# Patient Record
Sex: Female | Born: 1935 | Race: White | Hispanic: No | Smoking: Never smoker
Health system: Southern US, Community
[De-identification: ages and names within clinical notes are randomized; demographics above are authoritative.]

## PROBLEM LIST (undated history)

## (undated) DIAGNOSIS — F419 Anxiety disorder, unspecified: Secondary | ICD-10-CM

## (undated) DIAGNOSIS — I4891 Unspecified atrial fibrillation: Secondary | ICD-10-CM

## (undated) DIAGNOSIS — G4700 Insomnia, unspecified: Secondary | ICD-10-CM

## (undated) DIAGNOSIS — E785 Hyperlipidemia, unspecified: Secondary | ICD-10-CM

## (undated) DIAGNOSIS — F32A Depression, unspecified: Secondary | ICD-10-CM

## (undated) DIAGNOSIS — M1990 Unspecified osteoarthritis, unspecified site: Secondary | ICD-10-CM

## (undated) DIAGNOSIS — I10 Essential (primary) hypertension: Secondary | ICD-10-CM

## (undated) DIAGNOSIS — I5020 Unspecified systolic (congestive) heart failure: Secondary | ICD-10-CM

## (undated) DIAGNOSIS — F411 Generalized anxiety disorder: Secondary | ICD-10-CM

## (undated) DIAGNOSIS — Z5189 Encounter for other specified aftercare: Secondary | ICD-10-CM

## (undated) DIAGNOSIS — B0229 Other postherpetic nervous system involvement: Secondary | ICD-10-CM

## (undated) DIAGNOSIS — Z950 Presence of cardiac pacemaker: Secondary | ICD-10-CM

## (undated) DIAGNOSIS — H9190 Unspecified hearing loss, unspecified ear: Secondary | ICD-10-CM

## (undated) HISTORY — PX: PACEMAKER PLACEMENT: SHX43

## (undated) HISTORY — DX: Unspecified systolic (congestive) heart failure: I50.20

## (undated) HISTORY — DX: Unspecified hearing loss, unspecified ear: H91.90

## (undated) HISTORY — DX: Generalized anxiety disorder: F41.1

## (undated) HISTORY — DX: Other postherpetic nervous system involvement: B02.29

## (undated) HISTORY — DX: Hyperlipidemia, unspecified: E78.5

## (undated) HISTORY — DX: Anxiety disorder, unspecified: F41.9

## (undated) HISTORY — DX: Encounter for other specified aftercare: Z51.89

## (undated) HISTORY — DX: Unspecified atrial fibrillation: I48.91

## (undated) HISTORY — PX: TOTAL KNEE ARTHROPLASTY: SHX125

## (undated) HISTORY — PX: ABDOMINAL HYSTERECTOMY: SHX81

## (undated) HISTORY — DX: Essential (primary) hypertension: I10

---

## 2008-01-15 DIAGNOSIS — B0229 Other postherpetic nervous system involvement: Secondary | ICD-10-CM

## 2008-01-15 HISTORY — DX: Other postherpetic nervous system involvement: B02.29

## 2011-02-20 DIAGNOSIS — D510 Vitamin B12 deficiency anemia due to intrinsic factor deficiency: Secondary | ICD-10-CM | POA: Diagnosis not present

## 2011-02-20 DIAGNOSIS — E782 Mixed hyperlipidemia: Secondary | ICD-10-CM | POA: Diagnosis not present

## 2011-02-20 DIAGNOSIS — I10 Essential (primary) hypertension: Secondary | ICD-10-CM | POA: Diagnosis not present

## 2011-02-20 DIAGNOSIS — D509 Iron deficiency anemia, unspecified: Secondary | ICD-10-CM | POA: Diagnosis not present

## 2011-02-20 DIAGNOSIS — N390 Urinary tract infection, site not specified: Secondary | ICD-10-CM | POA: Diagnosis not present

## 2011-02-20 DIAGNOSIS — E038 Other specified hypothyroidism: Secondary | ICD-10-CM | POA: Diagnosis not present

## 2011-02-20 DIAGNOSIS — E559 Vitamin D deficiency, unspecified: Secondary | ICD-10-CM | POA: Diagnosis not present

## 2011-02-20 DIAGNOSIS — Z131 Encounter for screening for diabetes mellitus: Secondary | ICD-10-CM | POA: Diagnosis not present

## 2011-03-13 DIAGNOSIS — E782 Mixed hyperlipidemia: Secondary | ICD-10-CM | POA: Diagnosis not present

## 2011-03-13 DIAGNOSIS — E559 Vitamin D deficiency, unspecified: Secondary | ICD-10-CM | POA: Diagnosis not present

## 2011-03-13 DIAGNOSIS — N390 Urinary tract infection, site not specified: Secondary | ICD-10-CM | POA: Diagnosis not present

## 2011-03-13 DIAGNOSIS — M159 Polyosteoarthritis, unspecified: Secondary | ICD-10-CM | POA: Diagnosis not present

## 2011-03-13 DIAGNOSIS — B029 Zoster without complications: Secondary | ICD-10-CM | POA: Diagnosis not present

## 2011-05-31 DIAGNOSIS — I6529 Occlusion and stenosis of unspecified carotid artery: Secondary | ICD-10-CM | POA: Diagnosis not present

## 2011-05-31 DIAGNOSIS — N390 Urinary tract infection, site not specified: Secondary | ICD-10-CM | POA: Diagnosis not present

## 2011-06-19 DIAGNOSIS — I6529 Occlusion and stenosis of unspecified carotid artery: Secondary | ICD-10-CM | POA: Diagnosis not present

## 2011-06-19 DIAGNOSIS — B019 Varicella without complication: Secondary | ICD-10-CM | POA: Diagnosis not present

## 2011-06-19 DIAGNOSIS — I2109 ST elevation (STEMI) myocardial infarction involving other coronary artery of anterior wall: Secondary | ICD-10-CM | POA: Diagnosis not present

## 2011-06-19 DIAGNOSIS — E785 Hyperlipidemia, unspecified: Secondary | ICD-10-CM | POA: Diagnosis not present

## 2011-06-19 DIAGNOSIS — M1990 Unspecified osteoarthritis, unspecified site: Secondary | ICD-10-CM | POA: Diagnosis not present

## 2011-06-28 DIAGNOSIS — Z1231 Encounter for screening mammogram for malignant neoplasm of breast: Secondary | ICD-10-CM | POA: Diagnosis not present

## 2011-08-21 DIAGNOSIS — Z131 Encounter for screening for diabetes mellitus: Secondary | ICD-10-CM | POA: Diagnosis not present

## 2011-08-21 DIAGNOSIS — I509 Heart failure, unspecified: Secondary | ICD-10-CM | POA: Diagnosis not present

## 2011-08-21 DIAGNOSIS — I10 Essential (primary) hypertension: Secondary | ICD-10-CM | POA: Diagnosis not present

## 2011-08-21 DIAGNOSIS — E782 Mixed hyperlipidemia: Secondary | ICD-10-CM | POA: Diagnosis not present

## 2011-08-21 DIAGNOSIS — D509 Iron deficiency anemia, unspecified: Secondary | ICD-10-CM | POA: Diagnosis not present

## 2011-08-21 DIAGNOSIS — N390 Urinary tract infection, site not specified: Secondary | ICD-10-CM | POA: Diagnosis not present

## 2011-08-21 DIAGNOSIS — R7989 Other specified abnormal findings of blood chemistry: Secondary | ICD-10-CM | POA: Diagnosis not present

## 2011-09-04 DIAGNOSIS — I2510 Atherosclerotic heart disease of native coronary artery without angina pectoris: Secondary | ICD-10-CM | POA: Diagnosis not present

## 2011-09-04 DIAGNOSIS — M1710 Unilateral primary osteoarthritis, unspecified knee: Secondary | ICD-10-CM | POA: Diagnosis not present

## 2011-09-04 DIAGNOSIS — E785 Hyperlipidemia, unspecified: Secondary | ICD-10-CM | POA: Diagnosis not present

## 2011-09-04 DIAGNOSIS — N943 Premenstrual tension syndrome: Secondary | ICD-10-CM | POA: Diagnosis not present

## 2011-09-10 DIAGNOSIS — I739 Peripheral vascular disease, unspecified: Secondary | ICD-10-CM | POA: Diagnosis not present

## 2011-10-02 DIAGNOSIS — E785 Hyperlipidemia, unspecified: Secondary | ICD-10-CM | POA: Diagnosis not present

## 2011-10-02 DIAGNOSIS — I739 Peripheral vascular disease, unspecified: Secondary | ICD-10-CM | POA: Diagnosis not present

## 2011-10-02 DIAGNOSIS — M1990 Unspecified osteoarthritis, unspecified site: Secondary | ICD-10-CM | POA: Diagnosis not present

## 2011-10-02 DIAGNOSIS — R32 Unspecified urinary incontinence: Secondary | ICD-10-CM | POA: Diagnosis not present

## 2012-01-21 DIAGNOSIS — R350 Frequency of micturition: Secondary | ICD-10-CM | POA: Diagnosis not present

## 2012-01-21 DIAGNOSIS — N390 Urinary tract infection, site not specified: Secondary | ICD-10-CM | POA: Diagnosis not present

## 2012-01-21 DIAGNOSIS — B0229 Other postherpetic nervous system involvement: Secondary | ICD-10-CM | POA: Diagnosis not present

## 2012-01-21 DIAGNOSIS — M129 Arthropathy, unspecified: Secondary | ICD-10-CM | POA: Diagnosis not present

## 2012-01-21 DIAGNOSIS — E785 Hyperlipidemia, unspecified: Secondary | ICD-10-CM | POA: Diagnosis not present

## 2012-03-31 DIAGNOSIS — R300 Dysuria: Secondary | ICD-10-CM | POA: Diagnosis not present

## 2012-03-31 DIAGNOSIS — N390 Urinary tract infection, site not specified: Secondary | ICD-10-CM | POA: Diagnosis not present

## 2012-04-23 DIAGNOSIS — H6120 Impacted cerumen, unspecified ear: Secondary | ICD-10-CM | POA: Diagnosis not present

## 2012-05-07 DIAGNOSIS — H903 Sensorineural hearing loss, bilateral: Secondary | ICD-10-CM | POA: Diagnosis not present

## 2012-05-07 DIAGNOSIS — H6120 Impacted cerumen, unspecified ear: Secondary | ICD-10-CM | POA: Diagnosis not present

## 2012-07-01 DIAGNOSIS — Z1231 Encounter for screening mammogram for malignant neoplasm of breast: Secondary | ICD-10-CM | POA: Diagnosis not present

## 2012-07-01 DIAGNOSIS — Z0000 Encounter for general adult medical examination without abnormal findings: Secondary | ICD-10-CM | POA: Diagnosis not present

## 2012-07-01 DIAGNOSIS — Z23 Encounter for immunization: Secondary | ICD-10-CM | POA: Diagnosis not present

## 2012-07-03 DIAGNOSIS — M810 Age-related osteoporosis without current pathological fracture: Secondary | ICD-10-CM | POA: Diagnosis not present

## 2012-07-03 DIAGNOSIS — Z1231 Encounter for screening mammogram for malignant neoplasm of breast: Secondary | ICD-10-CM | POA: Diagnosis not present

## 2012-07-03 DIAGNOSIS — Z1389 Encounter for screening for other disorder: Secondary | ICD-10-CM | POA: Diagnosis not present

## 2012-07-03 DIAGNOSIS — Z780 Asymptomatic menopausal state: Secondary | ICD-10-CM | POA: Diagnosis not present

## 2012-07-07 DIAGNOSIS — Z1211 Encounter for screening for malignant neoplasm of colon: Secondary | ICD-10-CM | POA: Diagnosis not present

## 2012-10-19 DIAGNOSIS — Z23 Encounter for immunization: Secondary | ICD-10-CM | POA: Diagnosis not present

## 2012-10-20 DIAGNOSIS — N390 Urinary tract infection, site not specified: Secondary | ICD-10-CM | POA: Diagnosis not present

## 2012-10-22 DIAGNOSIS — E789 Disorder of lipoprotein metabolism, unspecified: Secondary | ICD-10-CM | POA: Diagnosis not present

## 2012-10-22 DIAGNOSIS — N390 Urinary tract infection, site not specified: Secondary | ICD-10-CM | POA: Diagnosis not present

## 2012-12-17 DIAGNOSIS — H65119 Acute and subacute allergic otitis media (mucoid) (sanguinous) (serous), unspecified ear: Secondary | ICD-10-CM | POA: Diagnosis not present

## 2012-12-17 DIAGNOSIS — R259 Unspecified abnormal involuntary movements: Secondary | ICD-10-CM | POA: Diagnosis not present

## 2012-12-17 DIAGNOSIS — H9319 Tinnitus, unspecified ear: Secondary | ICD-10-CM | POA: Diagnosis not present

## 2012-12-17 DIAGNOSIS — R5381 Other malaise: Secondary | ICD-10-CM | POA: Diagnosis not present

## 2012-12-17 DIAGNOSIS — G988 Other disorders of nervous system: Secondary | ICD-10-CM | POA: Diagnosis not present

## 2013-02-12 DIAGNOSIS — I5021 Acute systolic (congestive) heart failure: Secondary | ICD-10-CM | POA: Diagnosis not present

## 2013-02-12 DIAGNOSIS — I469 Cardiac arrest, cause unspecified: Secondary | ICD-10-CM | POA: Diagnosis not present

## 2013-02-12 DIAGNOSIS — J90 Pleural effusion, not elsewhere classified: Secondary | ICD-10-CM | POA: Diagnosis not present

## 2013-02-12 DIAGNOSIS — Q211 Atrial septal defect: Secondary | ICD-10-CM | POA: Diagnosis not present

## 2013-02-12 DIAGNOSIS — G7281 Critical illness myopathy: Secondary | ICD-10-CM | POA: Diagnosis not present

## 2013-02-12 DIAGNOSIS — Z9889 Other specified postprocedural states: Secondary | ICD-10-CM | POA: Diagnosis not present

## 2013-02-12 DIAGNOSIS — R570 Cardiogenic shock: Secondary | ICD-10-CM | POA: Diagnosis not present

## 2013-02-12 DIAGNOSIS — E785 Hyperlipidemia, unspecified: Secondary | ICD-10-CM | POA: Diagnosis not present

## 2013-02-12 DIAGNOSIS — I252 Old myocardial infarction: Secondary | ICD-10-CM | POA: Diagnosis not present

## 2013-02-12 DIAGNOSIS — J810 Acute pulmonary edema: Secondary | ICD-10-CM | POA: Diagnosis not present

## 2013-02-12 DIAGNOSIS — I10 Essential (primary) hypertension: Secondary | ICD-10-CM | POA: Diagnosis present

## 2013-02-12 DIAGNOSIS — Z950 Presence of cardiac pacemaker: Secondary | ICD-10-CM | POA: Diagnosis not present

## 2013-02-12 DIAGNOSIS — Z4682 Encounter for fitting and adjustment of non-vascular catheter: Secondary | ICD-10-CM | POA: Diagnosis not present

## 2013-02-12 DIAGNOSIS — N179 Acute kidney failure, unspecified: Secondary | ICD-10-CM | POA: Diagnosis not present

## 2013-02-12 DIAGNOSIS — R002 Palpitations: Secondary | ICD-10-CM | POA: Diagnosis not present

## 2013-02-12 DIAGNOSIS — B0223 Postherpetic polyneuropathy: Secondary | ICD-10-CM | POA: Diagnosis present

## 2013-02-12 DIAGNOSIS — Z96659 Presence of unspecified artificial knee joint: Secondary | ICD-10-CM | POA: Diagnosis not present

## 2013-02-12 DIAGNOSIS — D6869 Other thrombophilia: Secondary | ICD-10-CM | POA: Diagnosis not present

## 2013-02-12 DIAGNOSIS — Z87891 Personal history of nicotine dependence: Secondary | ICD-10-CM | POA: Diagnosis not present

## 2013-02-12 DIAGNOSIS — I4891 Unspecified atrial fibrillation: Secondary | ICD-10-CM | POA: Diagnosis not present

## 2013-02-12 DIAGNOSIS — R262 Difficulty in walking, not elsewhere classified: Secondary | ICD-10-CM | POA: Diagnosis not present

## 2013-02-12 DIAGNOSIS — I959 Hypotension, unspecified: Secondary | ICD-10-CM | POA: Diagnosis not present

## 2013-02-12 DIAGNOSIS — M1990 Unspecified osteoarthritis, unspecified site: Secondary | ICD-10-CM | POA: Diagnosis not present

## 2013-02-12 DIAGNOSIS — I499 Cardiac arrhythmia, unspecified: Secondary | ICD-10-CM | POA: Diagnosis not present

## 2013-02-12 DIAGNOSIS — S20219A Contusion of unspecified front wall of thorax, initial encounter: Secondary | ICD-10-CM | POA: Diagnosis not present

## 2013-02-12 DIAGNOSIS — Z90710 Acquired absence of both cervix and uterus: Secondary | ICD-10-CM | POA: Diagnosis not present

## 2013-02-12 DIAGNOSIS — K219 Gastro-esophageal reflux disease without esophagitis: Secondary | ICD-10-CM | POA: Diagnosis not present

## 2013-02-12 DIAGNOSIS — I509 Heart failure, unspecified: Secondary | ICD-10-CM | POA: Diagnosis not present

## 2013-02-12 DIAGNOSIS — M6281 Muscle weakness (generalized): Secondary | ICD-10-CM | POA: Diagnosis not present

## 2013-02-12 DIAGNOSIS — R7401 Elevation of levels of liver transaminase levels: Secondary | ICD-10-CM | POA: Diagnosis present

## 2013-02-12 DIAGNOSIS — R509 Fever, unspecified: Secondary | ICD-10-CM | POA: Diagnosis not present

## 2013-02-12 DIAGNOSIS — T17308A Unspecified foreign body in larynx causing other injury, initial encounter: Secondary | ICD-10-CM | POA: Diagnosis not present

## 2013-02-12 DIAGNOSIS — R9431 Abnormal electrocardiogram [ECG] [EKG]: Secondary | ICD-10-CM | POA: Diagnosis not present

## 2013-02-12 DIAGNOSIS — R1312 Dysphagia, oropharyngeal phase: Secondary | ICD-10-CM | POA: Diagnosis not present

## 2013-02-12 DIAGNOSIS — E119 Type 2 diabetes mellitus without complications: Secondary | ICD-10-CM | POA: Diagnosis not present

## 2013-02-12 DIAGNOSIS — R000 Tachycardia, unspecified: Secondary | ICD-10-CM | POA: Diagnosis not present

## 2013-02-12 DIAGNOSIS — T827XXA Infection and inflammatory reaction due to other cardiac and vascular devices, implants and grafts, initial encounter: Secondary | ICD-10-CM | POA: Diagnosis not present

## 2013-02-12 DIAGNOSIS — R488 Other symbolic dysfunctions: Secondary | ICD-10-CM | POA: Diagnosis not present

## 2013-02-12 DIAGNOSIS — Z7982 Long term (current) use of aspirin: Secondary | ICD-10-CM | POA: Diagnosis not present

## 2013-02-12 DIAGNOSIS — R7402 Elevation of levels of lactic acid dehydrogenase (LDH): Secondary | ICD-10-CM | POA: Diagnosis not present

## 2013-02-12 DIAGNOSIS — I4892 Unspecified atrial flutter: Secondary | ICD-10-CM | POA: Diagnosis not present

## 2013-02-12 DIAGNOSIS — E7800 Pure hypercholesterolemia, unspecified: Secondary | ICD-10-CM | POA: Diagnosis present

## 2013-02-12 DIAGNOSIS — R0609 Other forms of dyspnea: Secondary | ICD-10-CM | POA: Diagnosis not present

## 2013-02-12 DIAGNOSIS — I428 Other cardiomyopathies: Secondary | ICD-10-CM | POA: Diagnosis not present

## 2013-02-12 DIAGNOSIS — E789 Disorder of lipoprotein metabolism, unspecified: Secondary | ICD-10-CM | POA: Diagnosis not present

## 2013-02-12 DIAGNOSIS — R0602 Shortness of breath: Secondary | ICD-10-CM | POA: Diagnosis not present

## 2013-02-12 DIAGNOSIS — Z5189 Encounter for other specified aftercare: Secondary | ICD-10-CM | POA: Diagnosis not present

## 2013-02-12 DIAGNOSIS — T82897A Other specified complication of cardiac prosthetic devices, implants and grafts, initial encounter: Secondary | ICD-10-CM | POA: Diagnosis not present

## 2013-02-12 DIAGNOSIS — D72829 Elevated white blood cell count, unspecified: Secondary | ICD-10-CM | POA: Diagnosis not present

## 2013-02-12 DIAGNOSIS — Z8249 Family history of ischemic heart disease and other diseases of the circulatory system: Secondary | ICD-10-CM | POA: Diagnosis not present

## 2013-02-12 DIAGNOSIS — R7881 Bacteremia: Secondary | ICD-10-CM | POA: Diagnosis not present

## 2013-02-12 DIAGNOSIS — J811 Chronic pulmonary edema: Secondary | ICD-10-CM | POA: Diagnosis not present

## 2013-02-12 DIAGNOSIS — J189 Pneumonia, unspecified organism: Secondary | ICD-10-CM | POA: Diagnosis not present

## 2013-02-12 DIAGNOSIS — Q2111 Secundum atrial septal defect: Secondary | ICD-10-CM | POA: Diagnosis not present

## 2013-02-12 DIAGNOSIS — J9600 Acute respiratory failure, unspecified whether with hypoxia or hypercapnia: Secondary | ICD-10-CM | POA: Diagnosis not present

## 2013-02-12 DIAGNOSIS — I495 Sick sinus syndrome: Secondary | ICD-10-CM | POA: Diagnosis not present

## 2013-03-04 DIAGNOSIS — R488 Other symbolic dysfunctions: Secondary | ICD-10-CM | POA: Diagnosis not present

## 2013-03-04 DIAGNOSIS — I4891 Unspecified atrial fibrillation: Secondary | ICD-10-CM | POA: Diagnosis not present

## 2013-03-04 DIAGNOSIS — I5021 Acute systolic (congestive) heart failure: Secondary | ICD-10-CM | POA: Diagnosis not present

## 2013-03-04 DIAGNOSIS — I252 Old myocardial infarction: Secondary | ICD-10-CM | POA: Diagnosis not present

## 2013-03-04 DIAGNOSIS — E785 Hyperlipidemia, unspecified: Secondary | ICD-10-CM | POA: Diagnosis not present

## 2013-03-04 DIAGNOSIS — K219 Gastro-esophageal reflux disease without esophagitis: Secondary | ICD-10-CM | POA: Diagnosis not present

## 2013-03-04 DIAGNOSIS — R262 Difficulty in walking, not elsewhere classified: Secondary | ICD-10-CM | POA: Diagnosis not present

## 2013-03-04 DIAGNOSIS — I428 Other cardiomyopathies: Secondary | ICD-10-CM | POA: Diagnosis not present

## 2013-03-04 DIAGNOSIS — Z950 Presence of cardiac pacemaker: Secondary | ICD-10-CM | POA: Diagnosis not present

## 2013-03-04 DIAGNOSIS — Z5189 Encounter for other specified aftercare: Secondary | ICD-10-CM | POA: Diagnosis not present

## 2013-03-04 DIAGNOSIS — I4892 Unspecified atrial flutter: Secondary | ICD-10-CM | POA: Diagnosis not present

## 2013-03-04 DIAGNOSIS — R1312 Dysphagia, oropharyngeal phase: Secondary | ICD-10-CM | POA: Diagnosis not present

## 2013-03-04 DIAGNOSIS — M1990 Unspecified osteoarthritis, unspecified site: Secondary | ICD-10-CM | POA: Diagnosis not present

## 2013-03-04 DIAGNOSIS — M6281 Muscle weakness (generalized): Secondary | ICD-10-CM | POA: Diagnosis not present

## 2013-03-04 DIAGNOSIS — S20219A Contusion of unspecified front wall of thorax, initial encounter: Secondary | ICD-10-CM | POA: Diagnosis not present

## 2013-03-15 DIAGNOSIS — M159 Polyosteoarthritis, unspecified: Secondary | ICD-10-CM | POA: Diagnosis not present

## 2013-03-15 DIAGNOSIS — R1312 Dysphagia, oropharyngeal phase: Secondary | ICD-10-CM | POA: Diagnosis not present

## 2013-03-15 DIAGNOSIS — I4891 Unspecified atrial fibrillation: Secondary | ICD-10-CM | POA: Diagnosis not present

## 2013-03-15 DIAGNOSIS — M6281 Muscle weakness (generalized): Secondary | ICD-10-CM | POA: Diagnosis not present

## 2013-03-15 DIAGNOSIS — I517 Cardiomegaly: Secondary | ICD-10-CM | POA: Diagnosis not present

## 2013-03-15 DIAGNOSIS — R488 Other symbolic dysfunctions: Secondary | ICD-10-CM | POA: Diagnosis not present

## 2013-03-16 DIAGNOSIS — B0229 Other postherpetic nervous system involvement: Secondary | ICD-10-CM | POA: Diagnosis not present

## 2013-03-16 DIAGNOSIS — Z5189 Encounter for other specified aftercare: Secondary | ICD-10-CM | POA: Diagnosis not present

## 2013-03-16 DIAGNOSIS — I509 Heart failure, unspecified: Secondary | ICD-10-CM | POA: Diagnosis present

## 2013-03-16 DIAGNOSIS — I499 Cardiac arrhythmia, unspecified: Secondary | ICD-10-CM | POA: Diagnosis not present

## 2013-03-16 DIAGNOSIS — R55 Syncope and collapse: Secondary | ICD-10-CM | POA: Diagnosis not present

## 2013-03-16 DIAGNOSIS — M6281 Muscle weakness (generalized): Secondary | ICD-10-CM | POA: Diagnosis not present

## 2013-03-16 DIAGNOSIS — E860 Dehydration: Secondary | ICD-10-CM | POA: Diagnosis not present

## 2013-03-16 DIAGNOSIS — R5383 Other fatigue: Secondary | ICD-10-CM | POA: Diagnosis not present

## 2013-03-16 DIAGNOSIS — E785 Hyperlipidemia, unspecified: Secondary | ICD-10-CM | POA: Diagnosis not present

## 2013-03-16 DIAGNOSIS — K219 Gastro-esophageal reflux disease without esophagitis: Secondary | ICD-10-CM | POA: Diagnosis not present

## 2013-03-16 DIAGNOSIS — N19 Unspecified kidney failure: Secondary | ICD-10-CM | POA: Diagnosis not present

## 2013-03-16 DIAGNOSIS — R488 Other symbolic dysfunctions: Secondary | ICD-10-CM | POA: Diagnosis not present

## 2013-03-16 DIAGNOSIS — I4891 Unspecified atrial fibrillation: Secondary | ICD-10-CM | POA: Diagnosis not present

## 2013-03-16 DIAGNOSIS — R1312 Dysphagia, oropharyngeal phase: Secondary | ICD-10-CM | POA: Diagnosis not present

## 2013-03-16 DIAGNOSIS — I4902 Ventricular flutter: Secondary | ICD-10-CM | POA: Diagnosis not present

## 2013-03-16 DIAGNOSIS — M1990 Unspecified osteoarthritis, unspecified site: Secondary | ICD-10-CM | POA: Diagnosis not present

## 2013-03-16 DIAGNOSIS — R269 Unspecified abnormalities of gait and mobility: Secondary | ICD-10-CM | POA: Diagnosis not present

## 2013-03-16 DIAGNOSIS — I428 Other cardiomyopathies: Secondary | ICD-10-CM | POA: Diagnosis not present

## 2013-03-16 DIAGNOSIS — R5381 Other malaise: Secondary | ICD-10-CM | POA: Diagnosis not present

## 2013-03-16 DIAGNOSIS — R29898 Other symptoms and signs involving the musculoskeletal system: Secondary | ICD-10-CM | POA: Diagnosis present

## 2013-03-16 DIAGNOSIS — N179 Acute kidney failure, unspecified: Secondary | ICD-10-CM | POA: Diagnosis not present

## 2013-03-16 DIAGNOSIS — I4892 Unspecified atrial flutter: Secondary | ICD-10-CM | POA: Diagnosis not present

## 2013-03-16 DIAGNOSIS — Z9181 History of falling: Secondary | ICD-10-CM | POA: Diagnosis not present

## 2013-03-18 DIAGNOSIS — K219 Gastro-esophageal reflux disease without esophagitis: Secondary | ICD-10-CM | POA: Diagnosis not present

## 2013-03-18 DIAGNOSIS — R1312 Dysphagia, oropharyngeal phase: Secondary | ICD-10-CM | POA: Diagnosis not present

## 2013-03-18 DIAGNOSIS — I4902 Ventricular flutter: Secondary | ICD-10-CM | POA: Diagnosis not present

## 2013-03-18 DIAGNOSIS — I509 Heart failure, unspecified: Secondary | ICD-10-CM | POA: Diagnosis not present

## 2013-03-18 DIAGNOSIS — N19 Unspecified kidney failure: Secondary | ICD-10-CM | POA: Diagnosis not present

## 2013-03-18 DIAGNOSIS — B0223 Postherpetic polyneuropathy: Secondary | ICD-10-CM | POA: Diagnosis not present

## 2013-03-18 DIAGNOSIS — I495 Sick sinus syndrome: Secondary | ICD-10-CM | POA: Diagnosis not present

## 2013-03-18 DIAGNOSIS — Z5189 Encounter for other specified aftercare: Secondary | ICD-10-CM | POA: Diagnosis not present

## 2013-03-18 DIAGNOSIS — R488 Other symbolic dysfunctions: Secondary | ICD-10-CM | POA: Diagnosis not present

## 2013-03-18 DIAGNOSIS — R269 Unspecified abnormalities of gait and mobility: Secondary | ICD-10-CM | POA: Diagnosis not present

## 2013-03-18 DIAGNOSIS — I4891 Unspecified atrial fibrillation: Secondary | ICD-10-CM | POA: Diagnosis not present

## 2013-03-18 DIAGNOSIS — I5020 Unspecified systolic (congestive) heart failure: Secondary | ICD-10-CM | POA: Diagnosis not present

## 2013-03-18 DIAGNOSIS — M6281 Muscle weakness (generalized): Secondary | ICD-10-CM | POA: Diagnosis not present

## 2013-03-18 DIAGNOSIS — I428 Other cardiomyopathies: Secondary | ICD-10-CM | POA: Diagnosis not present

## 2013-03-18 DIAGNOSIS — R262 Difficulty in walking, not elsewhere classified: Secondary | ICD-10-CM | POA: Diagnosis not present

## 2013-03-18 DIAGNOSIS — M1990 Unspecified osteoarthritis, unspecified site: Secondary | ICD-10-CM | POA: Diagnosis not present

## 2013-03-18 DIAGNOSIS — R42 Dizziness and giddiness: Secondary | ICD-10-CM | POA: Diagnosis not present

## 2013-03-18 DIAGNOSIS — E785 Hyperlipidemia, unspecified: Secondary | ICD-10-CM | POA: Diagnosis not present

## 2013-03-18 DIAGNOSIS — I4892 Unspecified atrial flutter: Secondary | ICD-10-CM | POA: Diagnosis not present

## 2013-03-19 DIAGNOSIS — B0223 Postherpetic polyneuropathy: Secondary | ICD-10-CM | POA: Diagnosis not present

## 2013-03-19 DIAGNOSIS — R262 Difficulty in walking, not elsewhere classified: Secondary | ICD-10-CM | POA: Diagnosis not present

## 2013-03-19 DIAGNOSIS — I4891 Unspecified atrial fibrillation: Secondary | ICD-10-CM | POA: Diagnosis not present

## 2013-03-19 DIAGNOSIS — M6281 Muscle weakness (generalized): Secondary | ICD-10-CM | POA: Diagnosis not present

## 2013-03-23 DIAGNOSIS — R42 Dizziness and giddiness: Secondary | ICD-10-CM | POA: Diagnosis not present

## 2013-03-23 DIAGNOSIS — I495 Sick sinus syndrome: Secondary | ICD-10-CM | POA: Diagnosis not present

## 2013-03-23 DIAGNOSIS — I4892 Unspecified atrial flutter: Secondary | ICD-10-CM | POA: Diagnosis not present

## 2013-03-25 DIAGNOSIS — I4892 Unspecified atrial flutter: Secondary | ICD-10-CM | POA: Diagnosis not present

## 2013-03-25 DIAGNOSIS — I5020 Unspecified systolic (congestive) heart failure: Secondary | ICD-10-CM | POA: Diagnosis not present

## 2013-03-25 DIAGNOSIS — E785 Hyperlipidemia, unspecified: Secondary | ICD-10-CM | POA: Diagnosis not present

## 2013-03-25 DIAGNOSIS — R269 Unspecified abnormalities of gait and mobility: Secondary | ICD-10-CM | POA: Diagnosis not present

## 2013-03-28 DIAGNOSIS — M159 Polyosteoarthritis, unspecified: Secondary | ICD-10-CM | POA: Diagnosis not present

## 2013-03-28 DIAGNOSIS — R488 Other symbolic dysfunctions: Secondary | ICD-10-CM | POA: Diagnosis not present

## 2013-03-28 DIAGNOSIS — R1312 Dysphagia, oropharyngeal phase: Secondary | ICD-10-CM | POA: Diagnosis not present

## 2013-03-28 DIAGNOSIS — M6281 Muscle weakness (generalized): Secondary | ICD-10-CM | POA: Diagnosis not present

## 2013-03-28 DIAGNOSIS — I517 Cardiomegaly: Secondary | ICD-10-CM | POA: Diagnosis not present

## 2013-03-28 DIAGNOSIS — I4891 Unspecified atrial fibrillation: Secondary | ICD-10-CM | POA: Diagnosis not present

## 2013-03-30 DIAGNOSIS — M159 Polyosteoarthritis, unspecified: Secondary | ICD-10-CM | POA: Diagnosis not present

## 2013-03-30 DIAGNOSIS — I4891 Unspecified atrial fibrillation: Secondary | ICD-10-CM | POA: Diagnosis not present

## 2013-03-30 DIAGNOSIS — R488 Other symbolic dysfunctions: Secondary | ICD-10-CM | POA: Diagnosis not present

## 2013-03-30 DIAGNOSIS — M6281 Muscle weakness (generalized): Secondary | ICD-10-CM | POA: Diagnosis not present

## 2013-03-30 DIAGNOSIS — I517 Cardiomegaly: Secondary | ICD-10-CM | POA: Diagnosis not present

## 2013-03-30 DIAGNOSIS — R1312 Dysphagia, oropharyngeal phase: Secondary | ICD-10-CM | POA: Diagnosis not present

## 2013-03-31 DIAGNOSIS — I4891 Unspecified atrial fibrillation: Secondary | ICD-10-CM | POA: Diagnosis not present

## 2013-03-31 DIAGNOSIS — R1312 Dysphagia, oropharyngeal phase: Secondary | ICD-10-CM | POA: Diagnosis not present

## 2013-03-31 DIAGNOSIS — M6281 Muscle weakness (generalized): Secondary | ICD-10-CM | POA: Diagnosis not present

## 2013-03-31 DIAGNOSIS — M159 Polyosteoarthritis, unspecified: Secondary | ICD-10-CM | POA: Diagnosis not present

## 2013-03-31 DIAGNOSIS — R488 Other symbolic dysfunctions: Secondary | ICD-10-CM | POA: Diagnosis not present

## 2013-03-31 DIAGNOSIS — I517 Cardiomegaly: Secondary | ICD-10-CM | POA: Diagnosis not present

## 2013-04-03 DIAGNOSIS — R112 Nausea with vomiting, unspecified: Secondary | ICD-10-CM | POA: Diagnosis not present

## 2013-04-03 DIAGNOSIS — R109 Unspecified abdominal pain: Secondary | ICD-10-CM | POA: Diagnosis not present

## 2013-04-03 DIAGNOSIS — R42 Dizziness and giddiness: Secondary | ICD-10-CM | POA: Diagnosis not present

## 2013-04-03 DIAGNOSIS — I498 Other specified cardiac arrhythmias: Secondary | ICD-10-CM | POA: Diagnosis not present

## 2013-04-03 DIAGNOSIS — T82190A Other mechanical complication of cardiac electrode, initial encounter: Secondary | ICD-10-CM | POA: Diagnosis not present

## 2013-04-03 DIAGNOSIS — E7800 Pure hypercholesterolemia, unspecified: Secondary | ICD-10-CM | POA: Diagnosis not present

## 2013-04-03 DIAGNOSIS — I4891 Unspecified atrial fibrillation: Secondary | ICD-10-CM | POA: Diagnosis not present

## 2013-04-04 DIAGNOSIS — R109 Unspecified abdominal pain: Secondary | ICD-10-CM | POA: Diagnosis not present

## 2013-04-04 DIAGNOSIS — R42 Dizziness and giddiness: Secondary | ICD-10-CM | POA: Diagnosis not present

## 2013-04-04 DIAGNOSIS — B0223 Postherpetic polyneuropathy: Secondary | ICD-10-CM | POA: Diagnosis not present

## 2013-04-04 DIAGNOSIS — I509 Heart failure, unspecified: Secondary | ICD-10-CM | POA: Diagnosis not present

## 2013-04-04 DIAGNOSIS — R5383 Other fatigue: Secondary | ICD-10-CM | POA: Diagnosis present

## 2013-04-04 DIAGNOSIS — I4892 Unspecified atrial flutter: Secondary | ICD-10-CM | POA: Diagnosis not present

## 2013-04-04 DIAGNOSIS — I10 Essential (primary) hypertension: Secondary | ICD-10-CM | POA: Diagnosis not present

## 2013-04-04 DIAGNOSIS — R1110 Vomiting, unspecified: Secondary | ICD-10-CM | POA: Diagnosis not present

## 2013-04-04 DIAGNOSIS — R55 Syncope and collapse: Secondary | ICD-10-CM | POA: Diagnosis not present

## 2013-04-04 DIAGNOSIS — Z7982 Long term (current) use of aspirin: Secondary | ICD-10-CM | POA: Diagnosis not present

## 2013-04-04 DIAGNOSIS — IMO0002 Reserved for concepts with insufficient information to code with codable children: Secondary | ICD-10-CM | POA: Diagnosis present

## 2013-04-04 DIAGNOSIS — E7800 Pure hypercholesterolemia, unspecified: Secondary | ICD-10-CM | POA: Diagnosis not present

## 2013-04-04 DIAGNOSIS — Z95810 Presence of automatic (implantable) cardiac defibrillator: Secondary | ICD-10-CM | POA: Diagnosis not present

## 2013-04-04 DIAGNOSIS — R5381 Other malaise: Secondary | ICD-10-CM | POA: Diagnosis present

## 2013-04-04 DIAGNOSIS — Z87891 Personal history of nicotine dependence: Secondary | ICD-10-CM | POA: Diagnosis not present

## 2013-04-04 DIAGNOSIS — Z950 Presence of cardiac pacemaker: Secondary | ICD-10-CM | POA: Diagnosis not present

## 2013-04-04 DIAGNOSIS — I498 Other specified cardiac arrhythmias: Secondary | ICD-10-CM | POA: Diagnosis not present

## 2013-04-04 DIAGNOSIS — I4891 Unspecified atrial fibrillation: Secondary | ICD-10-CM | POA: Diagnosis not present

## 2013-04-04 DIAGNOSIS — Z96659 Presence of unspecified artificial knee joint: Secondary | ICD-10-CM | POA: Diagnosis not present

## 2013-04-04 DIAGNOSIS — I442 Atrioventricular block, complete: Secondary | ICD-10-CM | POA: Diagnosis not present

## 2013-04-04 DIAGNOSIS — R112 Nausea with vomiting, unspecified: Secondary | ICD-10-CM | POA: Diagnosis present

## 2013-04-07 DIAGNOSIS — I4891 Unspecified atrial fibrillation: Secondary | ICD-10-CM | POA: Diagnosis not present

## 2013-04-07 DIAGNOSIS — M159 Polyosteoarthritis, unspecified: Secondary | ICD-10-CM | POA: Diagnosis not present

## 2013-04-07 DIAGNOSIS — M6281 Muscle weakness (generalized): Secondary | ICD-10-CM | POA: Diagnosis not present

## 2013-04-07 DIAGNOSIS — I517 Cardiomegaly: Secondary | ICD-10-CM | POA: Diagnosis not present

## 2013-04-07 DIAGNOSIS — R1312 Dysphagia, oropharyngeal phase: Secondary | ICD-10-CM | POA: Diagnosis not present

## 2013-04-07 DIAGNOSIS — R488 Other symbolic dysfunctions: Secondary | ICD-10-CM | POA: Diagnosis not present

## 2013-04-09 DIAGNOSIS — M6281 Muscle weakness (generalized): Secondary | ICD-10-CM | POA: Diagnosis not present

## 2013-04-09 DIAGNOSIS — M159 Polyosteoarthritis, unspecified: Secondary | ICD-10-CM | POA: Diagnosis not present

## 2013-04-09 DIAGNOSIS — I4891 Unspecified atrial fibrillation: Secondary | ICD-10-CM | POA: Diagnosis not present

## 2013-04-09 DIAGNOSIS — I517 Cardiomegaly: Secondary | ICD-10-CM | POA: Diagnosis not present

## 2013-04-09 DIAGNOSIS — R488 Other symbolic dysfunctions: Secondary | ICD-10-CM | POA: Diagnosis not present

## 2013-04-09 DIAGNOSIS — R1312 Dysphagia, oropharyngeal phase: Secondary | ICD-10-CM | POA: Diagnosis not present

## 2013-04-12 DIAGNOSIS — I517 Cardiomegaly: Secondary | ICD-10-CM | POA: Diagnosis not present

## 2013-04-12 DIAGNOSIS — M159 Polyosteoarthritis, unspecified: Secondary | ICD-10-CM | POA: Diagnosis not present

## 2013-04-12 DIAGNOSIS — R488 Other symbolic dysfunctions: Secondary | ICD-10-CM | POA: Diagnosis not present

## 2013-04-12 DIAGNOSIS — I4891 Unspecified atrial fibrillation: Secondary | ICD-10-CM | POA: Diagnosis not present

## 2013-04-12 DIAGNOSIS — M6281 Muscle weakness (generalized): Secondary | ICD-10-CM | POA: Diagnosis not present

## 2013-04-12 DIAGNOSIS — R1312 Dysphagia, oropharyngeal phase: Secondary | ICD-10-CM | POA: Diagnosis not present

## 2013-04-13 DIAGNOSIS — M6281 Muscle weakness (generalized): Secondary | ICD-10-CM | POA: Diagnosis not present

## 2013-04-13 DIAGNOSIS — R1312 Dysphagia, oropharyngeal phase: Secondary | ICD-10-CM | POA: Diagnosis not present

## 2013-04-13 DIAGNOSIS — R488 Other symbolic dysfunctions: Secondary | ICD-10-CM | POA: Diagnosis not present

## 2013-04-13 DIAGNOSIS — M159 Polyosteoarthritis, unspecified: Secondary | ICD-10-CM | POA: Diagnosis not present

## 2013-04-13 DIAGNOSIS — I517 Cardiomegaly: Secondary | ICD-10-CM | POA: Diagnosis not present

## 2013-04-13 DIAGNOSIS — I4891 Unspecified atrial fibrillation: Secondary | ICD-10-CM | POA: Diagnosis not present

## 2013-04-14 DIAGNOSIS — R488 Other symbolic dysfunctions: Secondary | ICD-10-CM | POA: Diagnosis not present

## 2013-04-14 DIAGNOSIS — I517 Cardiomegaly: Secondary | ICD-10-CM | POA: Diagnosis not present

## 2013-04-14 DIAGNOSIS — M6281 Muscle weakness (generalized): Secondary | ICD-10-CM | POA: Diagnosis not present

## 2013-04-14 DIAGNOSIS — I4891 Unspecified atrial fibrillation: Secondary | ICD-10-CM | POA: Diagnosis not present

## 2013-04-14 DIAGNOSIS — R1312 Dysphagia, oropharyngeal phase: Secondary | ICD-10-CM | POA: Diagnosis not present

## 2013-04-14 DIAGNOSIS — M159 Polyosteoarthritis, unspecified: Secondary | ICD-10-CM | POA: Diagnosis not present

## 2013-04-19 DIAGNOSIS — I517 Cardiomegaly: Secondary | ICD-10-CM | POA: Diagnosis not present

## 2013-04-19 DIAGNOSIS — R488 Other symbolic dysfunctions: Secondary | ICD-10-CM | POA: Diagnosis not present

## 2013-04-19 DIAGNOSIS — R1312 Dysphagia, oropharyngeal phase: Secondary | ICD-10-CM | POA: Diagnosis not present

## 2013-04-19 DIAGNOSIS — I4891 Unspecified atrial fibrillation: Secondary | ICD-10-CM | POA: Diagnosis not present

## 2013-04-19 DIAGNOSIS — M159 Polyosteoarthritis, unspecified: Secondary | ICD-10-CM | POA: Diagnosis not present

## 2013-04-19 DIAGNOSIS — M6281 Muscle weakness (generalized): Secondary | ICD-10-CM | POA: Diagnosis not present

## 2013-04-20 DIAGNOSIS — I4891 Unspecified atrial fibrillation: Secondary | ICD-10-CM | POA: Diagnosis not present

## 2013-04-20 DIAGNOSIS — R42 Dizziness and giddiness: Secondary | ICD-10-CM | POA: Diagnosis not present

## 2013-04-20 DIAGNOSIS — M159 Polyosteoarthritis, unspecified: Secondary | ICD-10-CM | POA: Diagnosis not present

## 2013-04-20 DIAGNOSIS — I4892 Unspecified atrial flutter: Secondary | ICD-10-CM | POA: Diagnosis not present

## 2013-04-20 DIAGNOSIS — I517 Cardiomegaly: Secondary | ICD-10-CM | POA: Diagnosis not present

## 2013-04-20 DIAGNOSIS — R1312 Dysphagia, oropharyngeal phase: Secondary | ICD-10-CM | POA: Diagnosis not present

## 2013-04-20 DIAGNOSIS — I495 Sick sinus syndrome: Secondary | ICD-10-CM | POA: Diagnosis not present

## 2013-04-20 DIAGNOSIS — R488 Other symbolic dysfunctions: Secondary | ICD-10-CM | POA: Diagnosis not present

## 2013-04-20 DIAGNOSIS — I499 Cardiac arrhythmia, unspecified: Secondary | ICD-10-CM | POA: Diagnosis not present

## 2013-04-20 DIAGNOSIS — R000 Tachycardia, unspecified: Secondary | ICD-10-CM | POA: Diagnosis not present

## 2013-04-20 DIAGNOSIS — M6281 Muscle weakness (generalized): Secondary | ICD-10-CM | POA: Diagnosis not present

## 2013-04-21 DIAGNOSIS — I4892 Unspecified atrial flutter: Secondary | ICD-10-CM | POA: Diagnosis not present

## 2013-04-21 DIAGNOSIS — R42 Dizziness and giddiness: Secondary | ICD-10-CM | POA: Diagnosis not present

## 2013-04-21 DIAGNOSIS — I499 Cardiac arrhythmia, unspecified: Secondary | ICD-10-CM | POA: Diagnosis not present

## 2013-04-21 DIAGNOSIS — I495 Sick sinus syndrome: Secondary | ICD-10-CM | POA: Diagnosis not present

## 2013-04-21 DIAGNOSIS — I4891 Unspecified atrial fibrillation: Secondary | ICD-10-CM | POA: Diagnosis not present

## 2013-04-21 DIAGNOSIS — R000 Tachycardia, unspecified: Secondary | ICD-10-CM | POA: Diagnosis not present

## 2013-04-22 DIAGNOSIS — I517 Cardiomegaly: Secondary | ICD-10-CM | POA: Diagnosis not present

## 2013-04-22 DIAGNOSIS — M6281 Muscle weakness (generalized): Secondary | ICD-10-CM | POA: Diagnosis not present

## 2013-04-22 DIAGNOSIS — N390 Urinary tract infection, site not specified: Secondary | ICD-10-CM | POA: Diagnosis not present

## 2013-04-22 DIAGNOSIS — M159 Polyosteoarthritis, unspecified: Secondary | ICD-10-CM | POA: Diagnosis not present

## 2013-04-22 DIAGNOSIS — I509 Heart failure, unspecified: Secondary | ICD-10-CM | POA: Diagnosis not present

## 2013-04-22 DIAGNOSIS — R1312 Dysphagia, oropharyngeal phase: Secondary | ICD-10-CM | POA: Diagnosis not present

## 2013-04-22 DIAGNOSIS — I4891 Unspecified atrial fibrillation: Secondary | ICD-10-CM | POA: Diagnosis not present

## 2013-04-22 DIAGNOSIS — R488 Other symbolic dysfunctions: Secondary | ICD-10-CM | POA: Diagnosis not present

## 2013-04-26 DIAGNOSIS — M159 Polyosteoarthritis, unspecified: Secondary | ICD-10-CM | POA: Diagnosis not present

## 2013-04-26 DIAGNOSIS — M6281 Muscle weakness (generalized): Secondary | ICD-10-CM | POA: Diagnosis not present

## 2013-04-26 DIAGNOSIS — I517 Cardiomegaly: Secondary | ICD-10-CM | POA: Diagnosis not present

## 2013-04-26 DIAGNOSIS — R1312 Dysphagia, oropharyngeal phase: Secondary | ICD-10-CM | POA: Diagnosis not present

## 2013-04-26 DIAGNOSIS — R488 Other symbolic dysfunctions: Secondary | ICD-10-CM | POA: Diagnosis not present

## 2013-04-26 DIAGNOSIS — I4891 Unspecified atrial fibrillation: Secondary | ICD-10-CM | POA: Diagnosis not present

## 2013-04-27 DIAGNOSIS — I517 Cardiomegaly: Secondary | ICD-10-CM | POA: Diagnosis not present

## 2013-04-27 DIAGNOSIS — M159 Polyosteoarthritis, unspecified: Secondary | ICD-10-CM | POA: Diagnosis not present

## 2013-04-27 DIAGNOSIS — I4891 Unspecified atrial fibrillation: Secondary | ICD-10-CM | POA: Diagnosis not present

## 2013-04-27 DIAGNOSIS — M6281 Muscle weakness (generalized): Secondary | ICD-10-CM | POA: Diagnosis not present

## 2013-04-27 DIAGNOSIS — R488 Other symbolic dysfunctions: Secondary | ICD-10-CM | POA: Diagnosis not present

## 2013-04-27 DIAGNOSIS — R1312 Dysphagia, oropharyngeal phase: Secondary | ICD-10-CM | POA: Diagnosis not present

## 2013-04-28 DIAGNOSIS — E785 Hyperlipidemia, unspecified: Secondary | ICD-10-CM | POA: Diagnosis not present

## 2013-04-28 DIAGNOSIS — I4892 Unspecified atrial flutter: Secondary | ICD-10-CM | POA: Diagnosis not present

## 2013-04-28 DIAGNOSIS — R109 Unspecified abdominal pain: Secondary | ICD-10-CM | POA: Diagnosis not present

## 2013-04-28 DIAGNOSIS — R5381 Other malaise: Secondary | ICD-10-CM | POA: Diagnosis not present

## 2013-04-30 DIAGNOSIS — R488 Other symbolic dysfunctions: Secondary | ICD-10-CM | POA: Diagnosis not present

## 2013-04-30 DIAGNOSIS — M6281 Muscle weakness (generalized): Secondary | ICD-10-CM | POA: Diagnosis not present

## 2013-04-30 DIAGNOSIS — I4891 Unspecified atrial fibrillation: Secondary | ICD-10-CM | POA: Diagnosis not present

## 2013-04-30 DIAGNOSIS — I509 Heart failure, unspecified: Secondary | ICD-10-CM | POA: Diagnosis not present

## 2013-04-30 DIAGNOSIS — R1312 Dysphagia, oropharyngeal phase: Secondary | ICD-10-CM | POA: Diagnosis not present

## 2013-04-30 DIAGNOSIS — I517 Cardiomegaly: Secondary | ICD-10-CM | POA: Diagnosis not present

## 2013-04-30 DIAGNOSIS — M159 Polyosteoarthritis, unspecified: Secondary | ICD-10-CM | POA: Diagnosis not present

## 2013-05-04 DIAGNOSIS — I517 Cardiomegaly: Secondary | ICD-10-CM | POA: Diagnosis not present

## 2013-05-04 DIAGNOSIS — M6281 Muscle weakness (generalized): Secondary | ICD-10-CM | POA: Diagnosis not present

## 2013-05-04 DIAGNOSIS — I4891 Unspecified atrial fibrillation: Secondary | ICD-10-CM | POA: Diagnosis not present

## 2013-05-04 DIAGNOSIS — R1312 Dysphagia, oropharyngeal phase: Secondary | ICD-10-CM | POA: Diagnosis not present

## 2013-05-04 DIAGNOSIS — R488 Other symbolic dysfunctions: Secondary | ICD-10-CM | POA: Diagnosis not present

## 2013-05-04 DIAGNOSIS — M159 Polyosteoarthritis, unspecified: Secondary | ICD-10-CM | POA: Diagnosis not present

## 2013-05-06 DIAGNOSIS — M6281 Muscle weakness (generalized): Secondary | ICD-10-CM | POA: Diagnosis not present

## 2013-05-06 DIAGNOSIS — I4891 Unspecified atrial fibrillation: Secondary | ICD-10-CM | POA: Diagnosis not present

## 2013-05-06 DIAGNOSIS — R1312 Dysphagia, oropharyngeal phase: Secondary | ICD-10-CM | POA: Diagnosis not present

## 2013-05-06 DIAGNOSIS — I517 Cardiomegaly: Secondary | ICD-10-CM | POA: Diagnosis not present

## 2013-05-06 DIAGNOSIS — R488 Other symbolic dysfunctions: Secondary | ICD-10-CM | POA: Diagnosis not present

## 2013-05-06 DIAGNOSIS — M159 Polyosteoarthritis, unspecified: Secondary | ICD-10-CM | POA: Diagnosis not present

## 2013-05-10 DIAGNOSIS — R1312 Dysphagia, oropharyngeal phase: Secondary | ICD-10-CM | POA: Diagnosis not present

## 2013-05-10 DIAGNOSIS — M159 Polyosteoarthritis, unspecified: Secondary | ICD-10-CM | POA: Diagnosis not present

## 2013-05-10 DIAGNOSIS — R488 Other symbolic dysfunctions: Secondary | ICD-10-CM | POA: Diagnosis not present

## 2013-05-10 DIAGNOSIS — M6281 Muscle weakness (generalized): Secondary | ICD-10-CM | POA: Diagnosis not present

## 2013-05-10 DIAGNOSIS — I517 Cardiomegaly: Secondary | ICD-10-CM | POA: Diagnosis not present

## 2013-05-10 DIAGNOSIS — I4891 Unspecified atrial fibrillation: Secondary | ICD-10-CM | POA: Diagnosis not present

## 2013-05-11 DIAGNOSIS — I4891 Unspecified atrial fibrillation: Secondary | ICD-10-CM | POA: Diagnosis not present

## 2013-05-11 DIAGNOSIS — M159 Polyosteoarthritis, unspecified: Secondary | ICD-10-CM | POA: Diagnosis not present

## 2013-05-11 DIAGNOSIS — R488 Other symbolic dysfunctions: Secondary | ICD-10-CM | POA: Diagnosis not present

## 2013-05-11 DIAGNOSIS — M6281 Muscle weakness (generalized): Secondary | ICD-10-CM | POA: Diagnosis not present

## 2013-05-11 DIAGNOSIS — I517 Cardiomegaly: Secondary | ICD-10-CM | POA: Diagnosis not present

## 2013-05-11 DIAGNOSIS — R1312 Dysphagia, oropharyngeal phase: Secondary | ICD-10-CM | POA: Diagnosis not present

## 2013-05-13 DIAGNOSIS — I4891 Unspecified atrial fibrillation: Secondary | ICD-10-CM | POA: Diagnosis not present

## 2013-05-13 DIAGNOSIS — M159 Polyosteoarthritis, unspecified: Secondary | ICD-10-CM | POA: Diagnosis not present

## 2013-05-13 DIAGNOSIS — R488 Other symbolic dysfunctions: Secondary | ICD-10-CM | POA: Diagnosis not present

## 2013-05-13 DIAGNOSIS — I517 Cardiomegaly: Secondary | ICD-10-CM | POA: Diagnosis not present

## 2013-05-13 DIAGNOSIS — M6281 Muscle weakness (generalized): Secondary | ICD-10-CM | POA: Diagnosis not present

## 2013-05-13 DIAGNOSIS — R1312 Dysphagia, oropharyngeal phase: Secondary | ICD-10-CM | POA: Diagnosis not present

## 2013-05-14 DIAGNOSIS — R488 Other symbolic dysfunctions: Secondary | ICD-10-CM | POA: Diagnosis not present

## 2013-05-14 DIAGNOSIS — Z9181 History of falling: Secondary | ICD-10-CM | POA: Diagnosis not present

## 2013-05-14 DIAGNOSIS — R269 Unspecified abnormalities of gait and mobility: Secondary | ICD-10-CM | POA: Diagnosis not present

## 2013-05-14 DIAGNOSIS — M159 Polyosteoarthritis, unspecified: Secondary | ICD-10-CM | POA: Diagnosis not present

## 2013-05-14 DIAGNOSIS — M6281 Muscle weakness (generalized): Secondary | ICD-10-CM | POA: Diagnosis not present

## 2013-05-14 DIAGNOSIS — R1312 Dysphagia, oropharyngeal phase: Secondary | ICD-10-CM | POA: Diagnosis not present

## 2013-05-14 DIAGNOSIS — I509 Heart failure, unspecified: Secondary | ICD-10-CM | POA: Diagnosis not present

## 2013-05-14 DIAGNOSIS — Z602 Problems related to living alone: Secondary | ICD-10-CM | POA: Diagnosis not present

## 2013-05-14 DIAGNOSIS — I4891 Unspecified atrial fibrillation: Secondary | ICD-10-CM | POA: Diagnosis not present

## 2013-05-14 DIAGNOSIS — I517 Cardiomegaly: Secondary | ICD-10-CM | POA: Diagnosis not present

## 2013-05-18 DIAGNOSIS — M6281 Muscle weakness (generalized): Secondary | ICD-10-CM | POA: Diagnosis not present

## 2013-05-18 DIAGNOSIS — M159 Polyosteoarthritis, unspecified: Secondary | ICD-10-CM | POA: Diagnosis not present

## 2013-05-18 DIAGNOSIS — I509 Heart failure, unspecified: Secondary | ICD-10-CM | POA: Diagnosis not present

## 2013-05-18 DIAGNOSIS — R269 Unspecified abnormalities of gait and mobility: Secondary | ICD-10-CM | POA: Diagnosis not present

## 2013-05-18 DIAGNOSIS — N390 Urinary tract infection, site not specified: Secondary | ICD-10-CM | POA: Diagnosis not present

## 2013-05-18 DIAGNOSIS — R488 Other symbolic dysfunctions: Secondary | ICD-10-CM | POA: Diagnosis not present

## 2013-05-18 DIAGNOSIS — I4891 Unspecified atrial fibrillation: Secondary | ICD-10-CM | POA: Diagnosis not present

## 2013-05-19 DIAGNOSIS — M6281 Muscle weakness (generalized): Secondary | ICD-10-CM | POA: Diagnosis not present

## 2013-05-19 DIAGNOSIS — I423 Endomyocardial (eosinophilic) disease: Secondary | ICD-10-CM | POA: Diagnosis not present

## 2013-05-19 DIAGNOSIS — I4891 Unspecified atrial fibrillation: Secondary | ICD-10-CM | POA: Diagnosis not present

## 2013-05-19 DIAGNOSIS — I509 Heart failure, unspecified: Secondary | ICD-10-CM | POA: Diagnosis not present

## 2013-05-19 DIAGNOSIS — I4892 Unspecified atrial flutter: Secondary | ICD-10-CM | POA: Diagnosis not present

## 2013-05-19 DIAGNOSIS — I951 Orthostatic hypotension: Secondary | ICD-10-CM | POA: Diagnosis not present

## 2013-05-21 DIAGNOSIS — I509 Heart failure, unspecified: Secondary | ICD-10-CM | POA: Diagnosis not present

## 2013-05-21 DIAGNOSIS — I423 Endomyocardial (eosinophilic) disease: Secondary | ICD-10-CM | POA: Diagnosis not present

## 2013-05-21 DIAGNOSIS — I951 Orthostatic hypotension: Secondary | ICD-10-CM | POA: Diagnosis not present

## 2013-05-21 DIAGNOSIS — H539 Unspecified visual disturbance: Secondary | ICD-10-CM | POA: Diagnosis not present

## 2013-05-21 DIAGNOSIS — R5383 Other fatigue: Secondary | ICD-10-CM | POA: Diagnosis not present

## 2013-05-21 DIAGNOSIS — N390 Urinary tract infection, site not specified: Secondary | ICD-10-CM | POA: Diagnosis not present

## 2013-05-21 DIAGNOSIS — I4892 Unspecified atrial flutter: Secondary | ICD-10-CM | POA: Diagnosis not present

## 2013-05-21 DIAGNOSIS — Z950 Presence of cardiac pacemaker: Secondary | ICD-10-CM | POA: Diagnosis not present

## 2013-05-21 DIAGNOSIS — B0229 Other postherpetic nervous system involvement: Secondary | ICD-10-CM | POA: Diagnosis not present

## 2013-05-21 DIAGNOSIS — R079 Chest pain, unspecified: Secondary | ICD-10-CM | POA: Diagnosis not present

## 2013-05-21 DIAGNOSIS — I959 Hypotension, unspecified: Secondary | ICD-10-CM | POA: Diagnosis not present

## 2013-05-21 DIAGNOSIS — E7800 Pure hypercholesterolemia, unspecified: Secondary | ICD-10-CM | POA: Diagnosis not present

## 2013-05-21 DIAGNOSIS — R5381 Other malaise: Secondary | ICD-10-CM | POA: Diagnosis not present

## 2013-05-21 DIAGNOSIS — R42 Dizziness and giddiness: Secondary | ICD-10-CM | POA: Diagnosis not present

## 2013-05-21 DIAGNOSIS — M6281 Muscle weakness (generalized): Secondary | ICD-10-CM | POA: Diagnosis not present

## 2013-05-22 DIAGNOSIS — I4892 Unspecified atrial flutter: Secondary | ICD-10-CM | POA: Diagnosis not present

## 2013-05-22 DIAGNOSIS — N390 Urinary tract infection, site not specified: Secondary | ICD-10-CM | POA: Diagnosis not present

## 2013-05-22 DIAGNOSIS — M6281 Muscle weakness (generalized): Secondary | ICD-10-CM | POA: Diagnosis not present

## 2013-05-22 DIAGNOSIS — I509 Heart failure, unspecified: Secondary | ICD-10-CM | POA: Diagnosis not present

## 2013-05-25 DIAGNOSIS — M159 Polyosteoarthritis, unspecified: Secondary | ICD-10-CM | POA: Diagnosis not present

## 2013-05-25 DIAGNOSIS — I4891 Unspecified atrial fibrillation: Secondary | ICD-10-CM | POA: Diagnosis not present

## 2013-05-25 DIAGNOSIS — I509 Heart failure, unspecified: Secondary | ICD-10-CM | POA: Diagnosis not present

## 2013-05-25 DIAGNOSIS — R488 Other symbolic dysfunctions: Secondary | ICD-10-CM | POA: Diagnosis not present

## 2013-05-25 DIAGNOSIS — R269 Unspecified abnormalities of gait and mobility: Secondary | ICD-10-CM | POA: Diagnosis not present

## 2013-05-25 DIAGNOSIS — M6281 Muscle weakness (generalized): Secondary | ICD-10-CM | POA: Diagnosis not present

## 2013-05-26 DIAGNOSIS — M159 Polyosteoarthritis, unspecified: Secondary | ICD-10-CM | POA: Diagnosis not present

## 2013-05-26 DIAGNOSIS — M6281 Muscle weakness (generalized): Secondary | ICD-10-CM | POA: Diagnosis not present

## 2013-05-26 DIAGNOSIS — I4891 Unspecified atrial fibrillation: Secondary | ICD-10-CM | POA: Diagnosis not present

## 2013-05-26 DIAGNOSIS — R269 Unspecified abnormalities of gait and mobility: Secondary | ICD-10-CM | POA: Diagnosis not present

## 2013-05-26 DIAGNOSIS — I509 Heart failure, unspecified: Secondary | ICD-10-CM | POA: Diagnosis not present

## 2013-05-26 DIAGNOSIS — R488 Other symbolic dysfunctions: Secondary | ICD-10-CM | POA: Diagnosis not present

## 2013-05-27 DIAGNOSIS — I509 Heart failure, unspecified: Secondary | ICD-10-CM | POA: Diagnosis not present

## 2013-05-27 DIAGNOSIS — M159 Polyosteoarthritis, unspecified: Secondary | ICD-10-CM | POA: Diagnosis not present

## 2013-05-27 DIAGNOSIS — M6281 Muscle weakness (generalized): Secondary | ICD-10-CM | POA: Diagnosis not present

## 2013-05-27 DIAGNOSIS — R269 Unspecified abnormalities of gait and mobility: Secondary | ICD-10-CM | POA: Diagnosis not present

## 2013-05-27 DIAGNOSIS — R488 Other symbolic dysfunctions: Secondary | ICD-10-CM | POA: Diagnosis not present

## 2013-05-27 DIAGNOSIS — I4891 Unspecified atrial fibrillation: Secondary | ICD-10-CM | POA: Diagnosis not present

## 2013-05-31 DIAGNOSIS — I4891 Unspecified atrial fibrillation: Secondary | ICD-10-CM | POA: Diagnosis not present

## 2013-05-31 DIAGNOSIS — R488 Other symbolic dysfunctions: Secondary | ICD-10-CM | POA: Diagnosis not present

## 2013-05-31 DIAGNOSIS — M6281 Muscle weakness (generalized): Secondary | ICD-10-CM | POA: Diagnosis not present

## 2013-05-31 DIAGNOSIS — R269 Unspecified abnormalities of gait and mobility: Secondary | ICD-10-CM | POA: Diagnosis not present

## 2013-05-31 DIAGNOSIS — M159 Polyosteoarthritis, unspecified: Secondary | ICD-10-CM | POA: Diagnosis not present

## 2013-05-31 DIAGNOSIS — I509 Heart failure, unspecified: Secondary | ICD-10-CM | POA: Diagnosis not present

## 2013-06-01 DIAGNOSIS — I4892 Unspecified atrial flutter: Secondary | ICD-10-CM | POA: Diagnosis not present

## 2013-06-01 DIAGNOSIS — E785 Hyperlipidemia, unspecified: Secondary | ICD-10-CM | POA: Diagnosis not present

## 2013-06-01 DIAGNOSIS — R269 Unspecified abnormalities of gait and mobility: Secondary | ICD-10-CM | POA: Diagnosis not present

## 2013-06-01 DIAGNOSIS — B0229 Other postherpetic nervous system involvement: Secondary | ICD-10-CM | POA: Diagnosis not present

## 2013-06-02 DIAGNOSIS — M6281 Muscle weakness (generalized): Secondary | ICD-10-CM | POA: Diagnosis not present

## 2013-06-02 DIAGNOSIS — M159 Polyosteoarthritis, unspecified: Secondary | ICD-10-CM | POA: Diagnosis not present

## 2013-06-02 DIAGNOSIS — R269 Unspecified abnormalities of gait and mobility: Secondary | ICD-10-CM | POA: Diagnosis not present

## 2013-06-02 DIAGNOSIS — R488 Other symbolic dysfunctions: Secondary | ICD-10-CM | POA: Diagnosis not present

## 2013-06-02 DIAGNOSIS — I509 Heart failure, unspecified: Secondary | ICD-10-CM | POA: Diagnosis not present

## 2013-06-02 DIAGNOSIS — I4891 Unspecified atrial fibrillation: Secondary | ICD-10-CM | POA: Diagnosis not present

## 2013-06-04 DIAGNOSIS — M6281 Muscle weakness (generalized): Secondary | ICD-10-CM | POA: Diagnosis not present

## 2013-06-04 DIAGNOSIS — I4891 Unspecified atrial fibrillation: Secondary | ICD-10-CM | POA: Diagnosis not present

## 2013-06-04 DIAGNOSIS — R488 Other symbolic dysfunctions: Secondary | ICD-10-CM | POA: Diagnosis not present

## 2013-06-04 DIAGNOSIS — M159 Polyosteoarthritis, unspecified: Secondary | ICD-10-CM | POA: Diagnosis not present

## 2013-06-04 DIAGNOSIS — I509 Heart failure, unspecified: Secondary | ICD-10-CM | POA: Diagnosis not present

## 2013-06-04 DIAGNOSIS — R269 Unspecified abnormalities of gait and mobility: Secondary | ICD-10-CM | POA: Diagnosis not present

## 2013-06-07 DIAGNOSIS — I4891 Unspecified atrial fibrillation: Secondary | ICD-10-CM | POA: Diagnosis not present

## 2013-06-07 DIAGNOSIS — R488 Other symbolic dysfunctions: Secondary | ICD-10-CM | POA: Diagnosis not present

## 2013-06-07 DIAGNOSIS — R269 Unspecified abnormalities of gait and mobility: Secondary | ICD-10-CM | POA: Diagnosis not present

## 2013-06-07 DIAGNOSIS — M6281 Muscle weakness (generalized): Secondary | ICD-10-CM | POA: Diagnosis not present

## 2013-06-07 DIAGNOSIS — M159 Polyosteoarthritis, unspecified: Secondary | ICD-10-CM | POA: Diagnosis not present

## 2013-06-07 DIAGNOSIS — I509 Heart failure, unspecified: Secondary | ICD-10-CM | POA: Diagnosis not present

## 2013-06-08 DIAGNOSIS — R488 Other symbolic dysfunctions: Secondary | ICD-10-CM | POA: Diagnosis not present

## 2013-06-08 DIAGNOSIS — R269 Unspecified abnormalities of gait and mobility: Secondary | ICD-10-CM | POA: Diagnosis not present

## 2013-06-08 DIAGNOSIS — M6281 Muscle weakness (generalized): Secondary | ICD-10-CM | POA: Diagnosis not present

## 2013-06-08 DIAGNOSIS — I4891 Unspecified atrial fibrillation: Secondary | ICD-10-CM | POA: Diagnosis not present

## 2013-06-08 DIAGNOSIS — M159 Polyosteoarthritis, unspecified: Secondary | ICD-10-CM | POA: Diagnosis not present

## 2013-06-08 DIAGNOSIS — I509 Heart failure, unspecified: Secondary | ICD-10-CM | POA: Diagnosis not present

## 2013-06-14 DIAGNOSIS — I509 Heart failure, unspecified: Secondary | ICD-10-CM | POA: Diagnosis not present

## 2013-06-14 DIAGNOSIS — R269 Unspecified abnormalities of gait and mobility: Secondary | ICD-10-CM | POA: Diagnosis not present

## 2013-06-14 DIAGNOSIS — M6281 Muscle weakness (generalized): Secondary | ICD-10-CM | POA: Diagnosis not present

## 2013-06-14 DIAGNOSIS — I4891 Unspecified atrial fibrillation: Secondary | ICD-10-CM | POA: Diagnosis not present

## 2013-06-14 DIAGNOSIS — R488 Other symbolic dysfunctions: Secondary | ICD-10-CM | POA: Diagnosis not present

## 2013-06-14 DIAGNOSIS — M159 Polyosteoarthritis, unspecified: Secondary | ICD-10-CM | POA: Diagnosis not present

## 2013-06-15 DIAGNOSIS — R269 Unspecified abnormalities of gait and mobility: Secondary | ICD-10-CM | POA: Diagnosis not present

## 2013-06-15 DIAGNOSIS — R488 Other symbolic dysfunctions: Secondary | ICD-10-CM | POA: Diagnosis not present

## 2013-06-15 DIAGNOSIS — M6281 Muscle weakness (generalized): Secondary | ICD-10-CM | POA: Diagnosis not present

## 2013-06-15 DIAGNOSIS — M159 Polyosteoarthritis, unspecified: Secondary | ICD-10-CM | POA: Diagnosis not present

## 2013-06-15 DIAGNOSIS — I4891 Unspecified atrial fibrillation: Secondary | ICD-10-CM | POA: Diagnosis not present

## 2013-06-15 DIAGNOSIS — I509 Heart failure, unspecified: Secondary | ICD-10-CM | POA: Diagnosis not present

## 2013-06-16 DIAGNOSIS — I495 Sick sinus syndrome: Secondary | ICD-10-CM | POA: Diagnosis not present

## 2013-06-16 DIAGNOSIS — I4892 Unspecified atrial flutter: Secondary | ICD-10-CM | POA: Diagnosis not present

## 2013-06-16 DIAGNOSIS — I442 Atrioventricular block, complete: Secondary | ICD-10-CM | POA: Diagnosis not present

## 2013-06-17 DIAGNOSIS — I4891 Unspecified atrial fibrillation: Secondary | ICD-10-CM | POA: Diagnosis not present

## 2013-06-17 DIAGNOSIS — R269 Unspecified abnormalities of gait and mobility: Secondary | ICD-10-CM | POA: Diagnosis not present

## 2013-06-17 DIAGNOSIS — R488 Other symbolic dysfunctions: Secondary | ICD-10-CM | POA: Diagnosis not present

## 2013-06-17 DIAGNOSIS — M159 Polyosteoarthritis, unspecified: Secondary | ICD-10-CM | POA: Diagnosis not present

## 2013-06-17 DIAGNOSIS — I509 Heart failure, unspecified: Secondary | ICD-10-CM | POA: Diagnosis not present

## 2013-06-17 DIAGNOSIS — M6281 Muscle weakness (generalized): Secondary | ICD-10-CM | POA: Diagnosis not present

## 2013-06-29 ENCOUNTER — Ambulatory Visit (INDEPENDENT_AMBULATORY_CARE_PROVIDER_SITE_OTHER): Payer: Medicare Other | Admitting: Internal Medicine

## 2013-06-29 ENCOUNTER — Other Ambulatory Visit (INDEPENDENT_AMBULATORY_CARE_PROVIDER_SITE_OTHER): Payer: Medicare Other

## 2013-06-29 ENCOUNTER — Encounter: Payer: Self-pay | Admitting: Internal Medicine

## 2013-06-29 VITALS — BP 130/82 | HR 72 | Temp 97.6°F | Resp 16 | Wt 161.5 lb

## 2013-06-29 DIAGNOSIS — F411 Generalized anxiety disorder: Secondary | ICD-10-CM

## 2013-06-29 DIAGNOSIS — I5020 Unspecified systolic (congestive) heart failure: Secondary | ICD-10-CM | POA: Diagnosis not present

## 2013-06-29 DIAGNOSIS — E781 Pure hyperglyceridemia: Secondary | ICD-10-CM | POA: Insufficient documentation

## 2013-06-29 DIAGNOSIS — F418 Other specified anxiety disorders: Secondary | ICD-10-CM | POA: Insufficient documentation

## 2013-06-29 DIAGNOSIS — I10 Essential (primary) hypertension: Secondary | ICD-10-CM | POA: Diagnosis not present

## 2013-06-29 DIAGNOSIS — I4891 Unspecified atrial fibrillation: Secondary | ICD-10-CM

## 2013-06-29 DIAGNOSIS — Z23 Encounter for immunization: Secondary | ICD-10-CM | POA: Diagnosis not present

## 2013-06-29 DIAGNOSIS — H9190 Unspecified hearing loss, unspecified ear: Secondary | ICD-10-CM

## 2013-06-29 DIAGNOSIS — E785 Hyperlipidemia, unspecified: Secondary | ICD-10-CM

## 2013-06-29 DIAGNOSIS — B0229 Other postherpetic nervous system involvement: Secondary | ICD-10-CM

## 2013-06-29 LAB — COMPREHENSIVE METABOLIC PANEL
ALT: 23 U/L (ref 0–35)
AST: 28 U/L (ref 0–37)
Albumin: 4.3 g/dL (ref 3.5–5.2)
Alkaline Phosphatase: 71 U/L (ref 39–117)
BUN: 18 mg/dL (ref 6–23)
CO2: 28 mEq/L (ref 19–32)
Calcium: 9.3 mg/dL (ref 8.4–10.5)
Chloride: 104 mEq/L (ref 96–112)
Creatinine, Ser: 1.1 mg/dL (ref 0.4–1.2)
GFR: 51.79 mL/min — ABNORMAL LOW (ref >60.00)
Glucose, Bld: 90 mg/dL (ref 70–99)
Potassium: 4.6 mEq/L (ref 3.5–5.1)
Sodium: 139 mEq/L (ref 135–145)
Total Bilirubin: 0.5 mg/dL (ref 0.2–1.2)
Total Protein: 7 g/dL (ref 6.0–8.3)

## 2013-06-29 LAB — CBC WITH DIFFERENTIAL/PLATELET
Basophils Absolute: 0 10*3/uL (ref 0.0–0.1)
Basophils Relative: 0.4 % (ref 0.0–3.0)
Eosinophils Absolute: 0.2 10*3/uL (ref 0.0–0.7)
Eosinophils Relative: 2.6 % (ref 0.0–5.0)
HCT: 40.9 % (ref 36.0–46.0)
Hemoglobin: 13.6 g/dL (ref 12.0–15.0)
Lymphocytes Relative: 20.5 % (ref 12.0–46.0)
Lymphs Abs: 1.7 10*3/uL (ref 0.7–4.0)
MCHC: 33.3 g/dL (ref 30.0–36.0)
MCV: 94.9 fl (ref 78.0–100.0)
Monocytes Absolute: 0.7 10*3/uL (ref 0.1–1.0)
Monocytes Relative: 8 % (ref 3.0–12.0)
Neutro Abs: 5.7 10*3/uL (ref 1.4–7.7)
Neutrophils Relative %: 68.5 % (ref 43.0–77.0)
Platelets: 221 10*3/uL (ref 150.0–400.0)
RBC: 4.31 Mil/uL (ref 3.87–5.11)
RDW: 15.3 % (ref 11.5–15.5)
WBC: 8.4 10*3/uL (ref 4.0–10.5)

## 2013-06-29 LAB — LIPID PANEL
Cholesterol: 174 mg/dL (ref 0–200)
HDL: 59.3 mg/dL (ref >39.00)
LDL Cholesterol: 90 mg/dL (ref 0–99)
NonHDL: 114.7
Total CHOL/HDL Ratio: 3
Triglycerides: 124 mg/dL (ref 0.0–149.0)
VLDL: 24.8 mg/dL (ref 0.0–40.0)

## 2013-06-29 LAB — TSH: TSH: 1.95 u[IU]/mL (ref 0.35–4.50)

## 2013-06-29 MED ORDER — ALPRAZOLAM 0.25 MG PO TABS: 0.2500 mg | ORAL_TABLET | Freq: Every day | ORAL | Status: DC | PRN

## 2013-06-29 MED ORDER — PREGABALIN 75 MG PO CAPS: 75.0000 mg | ORAL_CAPSULE | Freq: Two times a day (BID) | ORAL | Status: DC

## 2013-06-29 NOTE — Assessment & Plan Note (Signed)
She has good rate & rhythm control

## 2013-06-29 NOTE — Assessment & Plan Note (Signed)
She wants to establish with a cardiologist in [place]

## 2013-06-29 NOTE — Assessment & Plan Note (Signed)
She will cont xanax as needed 

## 2013-06-29 NOTE — Assessment & Plan Note (Signed)
She has achieved her LDL goal 

## 2013-06-29 NOTE — Assessment & Plan Note (Signed)
Cont lyrica as needed

## 2013-06-29 NOTE — Progress Notes (Signed)
Subjective:    Patient ID: Deborah Chung, female    DOB: 08/16/1926, 80 y.o.   MRN: [id number]  Congestive Heart Failure Presents for follow-up visit. Pertinent negatives include no abdominal pain, chest pain, chest pressure, claudication, edema, fatigue, muscle weakness, near-syncope, nocturia, orthopnea, palpitations, paroxysmal nocturnal dyspnea, shortness of breath or unexpected weight change. The symptoms have been improved. Past treatments include beta blockers and aldosterone receptor blockers. The treatment provided significant relief. Compliance with prior treatments has been good. Her past medical history is significant for arrhythmia. There is no history of anemia, CAD, chronic lung disease, CVA, DM, DVT, HTN, hyperthyroidism, myocarditis, PE or valvular heart disease. Compliance with total regimen is 76-100%. Compliance with diet is 76-100%. Compliance with exercise is 76-100%. Compliance with medications is 76-100%.      Review of Systems  Constitutional: Negative.  Negative for chills, diaphoresis, appetite change, fatigue and unexpected weight change.  HENT: Positive for hearing loss. Negative for congestion, dental problem, drooling, ear discharge, ear pain, facial swelling, mouth sores, nosebleeds, postnasal drip, rhinorrhea, sinus pressure, sneezing, sore throat, tinnitus, trouble swallowing and voice change.   Eyes: Negative.   Respiratory: Negative.  Negative for cough, choking, chest tightness, shortness of breath and stridor.   Cardiovascular: Negative.  Negative for chest pain, palpitations, claudication, leg swelling and near-syncope.  Gastrointestinal: Negative.  Negative for nausea, vomiting, abdominal pain, diarrhea, constipation and blood in stool.  Endocrine: Negative.   Genitourinary: Negative.  Negative for nocturia.  Musculoskeletal: Negative.  Negative for arthralgias, back pain, gait problem, joint swelling, muscle weakness, myalgias, neck pain and neck  stiffness.       She has chronic pain over the left flank s/p an episode of shingles.  Skin: Negative.   Allergic/Immunologic: Negative.   Neurological: Negative.   Hematological: Negative.  Negative for adenopathy. Does not bruise/bleed easily.  Psychiatric/Behavioral: Negative for suicidal ideas, hallucinations, behavioral problems, confusion, sleep disturbance, self-injury, dysphoric mood, decreased concentration and agitation. The patient is nervous/anxious. The patient is not hyperactive.        Objective:   Physical Exam  Vitals reviewed. Constitutional: She is oriented to person, place, and time. She appears well-developed and well-nourished. No distress.  HENT:  Head: Normocephalic and atraumatic.  Mouth/Throat: Oropharynx is clear and moist. No oropharyngeal exudate.  Eyes: Conjunctivae are normal. Right eye exhibits no discharge. Left eye exhibits no discharge. No scleral icterus.  Neck: Normal range of motion. Neck supple. No JVD present. No tracheal deviation present. No thyromegaly present.  Cardiovascular: Normal rate, regular rhythm, normal heart sounds and intact distal pulses.  Exam reveals no gallop and no friction rub.   No murmur heard. Pulmonary/Chest: Effort normal and breath sounds normal. No stridor. No respiratory distress. She has no wheezes. She has no rales. She exhibits no tenderness.  Abdominal: Soft. Normal appearance and bowel sounds are normal. She exhibits no distension and no mass. There is no tenderness. There is no rebound and no guarding.  Musculoskeletal: Normal range of motion. She exhibits no edema and no tenderness.  Lymphadenopathy:    She has no cervical adenopathy.  Neurological: She is oriented to person, place, and time.  Skin: Skin is warm and dry. No rash noted. She is not diaphoretic. No erythema. No pallor.  Psychiatric: She has a normal mood and affect. Her behavior is normal. Judgment and thought content normal.     No results found  for this basename: WBC, HGB, HCT, PLT, GLUCOSE, CHOL, TRIG, HDL, LDLDIRECT, LDLCALC, ALT,  AST, NA, K, CL, CREATININE, BUN, CO2, TSH, PSA, INR, GLUF, HGBA1C, MICROALBUR       Assessment & Plan:

## 2013-06-29 NOTE — Assessment & Plan Note (Signed)
Audiology referral.

## 2013-06-29 NOTE — Patient Instructions (Signed)
Heart Failure °Heart failure is a condition in which the heart has trouble pumping blood. This means your heart does not pump blood efficiently for your body to work well. In some cases of heart failure, fluid may back up into your lungs or you may have swelling (edema) in your lower legs. Heart failure is usually a long-term (chronic) condition. It is important for you to take good care of yourself and follow your caregiver's treatment plan. °CAUSES  °Some health conditions can cause heart failure. Those health conditions include: °· High blood pressure (hypertension) causes the heart muscle to work harder than normal. When pressure in the blood vessels is high, the heart needs to pump (contract) with more force in order to circulate blood throughout the body. High blood pressure eventually causes the heart to become stiff and weak. °· Coronary artery disease (CAD) is the buildup of cholesterol and fat (plaque) in the arteries of the heart. The blockage in the arteries deprives the heart muscle of oxygen and blood. This can cause chest pain and may lead to a heart attack. High blood pressure can also contribute to CAD. °· Heart attack (myocardial infarction) occurs when 1 or more arteries in the heart become blocked. The loss of oxygen damages the muscle tissue of the heart. When this happens, part of the heart muscle dies. The injured tissue does not contract as well and weakens the heart's ability to pump blood. °· Abnormal heart valves can cause heart failure when the heart valves do not open and close properly. This makes the heart muscle pump harder to keep the blood flowing. °· Heart muscle disease (cardiomyopathy or myocarditis) is damage to the heart muscle from a variety of causes. These can include drug or alcohol abuse, infections, or unknown reasons. These can increase the risk of heart failure. °· Lung disease makes the heart work harder because the lungs do not work properly. This can cause a strain  on the heart, leading it to fail. °· Diabetes increases the risk of heart failure. High blood sugar contributes to high fat (lipid) levels in the blood. Diabetes can also cause slow damage to tiny blood vessels that carry important nutrients to the heart muscle. When the heart does not get enough oxygen and food, it can cause the heart to become weak and stiff. This leads to a heart that does not contract efficiently. °· Other conditions can contribute to heart failure. These include abnormal heart rhythms, thyroid problems, and low blood counts (anemia). °Certain unhealthy behaviors can increase the risk of heart failure. Those unhealthy behaviors include: °· Being overweight. °· Smoking or chewing tobacco. °· Eating foods high in fat and cholesterol. °· Abusing illicit drugs or alcohol. °· Lacking physical activity. °SYMPTOMS  °Heart failure symptoms may vary and can be hard to detect. Symptoms may include: °· Shortness of breath with activity, such as climbing stairs. °· Persistent cough. °· Swelling of the feet, ankles, legs, or abdomen. °· Unexplained weight gain. °· Difficulty breathing when lying flat (orthopnea). °· Waking from sleep because of the need to sit up and get more air. °· Rapid heartbeat. °· Fatigue and loss of energy. °· Feeling lightheaded, dizzy, or close to fainting. °· Loss of appetite. °· Nausea. °· Increased urination during the night (nocturia). °DIAGNOSIS  °A diagnosis of heart failure is based on your history, symptoms, physical examination, and diagnostic tests. °Diagnostic tests for heart failure may include: °· Echocardiography. °· Electrocardiography. °· Chest X-ray. °· Blood tests. °· Exercise   stress test. °· Cardiac angiography. °· Radionuclide scans. °TREATMENT  °Treatment is aimed at managing the symptoms of heart failure. Medicines, behavioral changes, or surgical intervention may be necessary to treat heart failure. °· Medicines to help treat heart failure may  include: °· Angiotensin-converting enzyme (ACE) inhibitors. This type of medicine blocks the effects of a blood protein called angiotensin-converting enzyme. ACE inhibitors relax (dilate) the blood vessels and help lower blood pressure. °· Angiotensin receptor blockers. This type of medicine blocks the actions of a blood protein called angiotensin. Angiotensin receptor blockers dilate the blood vessels and help lower blood pressure. °· Water pills (diuretics). Diuretics cause the kidneys to remove salt and water from the blood. The extra fluid is removed through urination. This loss of extra fluid lowers the volume of blood the heart pumps. °· Beta blockers. These prevent the heart from beating too fast and improve heart muscle strength. °· Digitalis. This increases the force of the heartbeat. °· Healthy behavior changes include: °· Obtaining and maintaining a healthy weight. °· Stopping smoking or chewing tobacco. °· Eating heart healthy foods. °· Limiting or avoiding alcohol. °· Stopping illicit drug use. °· Physical activity as directed by your caregiver. °· Surgical treatment for heart failure may include: °· A procedure to open blocked arteries, repair damaged heart valves, or remove damaged heart muscle tissue. °· A pacemaker to improve heart muscle function and control certain abnormal heart rhythms. °· An internal cardioverter defibrillator to treat certain serious abnormal heart rhythms. °· A left ventricular assist device to assist the pumping ability of the heart. °HOME CARE INSTRUCTIONS  °· Take your medicine as directed by your caregiver. Medicines are important in reducing the workload of your heart, slowing the progression of heart failure, and improving your symptoms. °· Do not stop taking your medicine unless directed by your caregiver. °· Do not skip any dose of medicine. °· Refill your prescriptions before you run out of medicine. Your medicines are needed every day. °· Take over-the-counter  medicine only as directed by your caregiver or pharmacist. °· Engage in moderate physical activity if directed by your caregiver. Moderate physical activity can benefit some people. The elderly and people with severe heart failure should consult with a caregiver for physical activity recommendations. °· Eat heart healthy foods. Food choices should be free of trans fat and low in saturated fat, cholesterol, and salt (sodium). Healthy choices include fresh or frozen fruits and vegetables, fish, lean meats, legumes, fat-free or low-fat dairy products, and whole grain or high fiber foods. Talk to a dietitian to learn more about heart healthy foods. °· Limit sodium if directed by your caregiver. Sodium restriction may reduce symptoms of heart failure in some people. Talk to a dietitian to learn more about heart healthy seasonings. °· Use healthy cooking methods. Healthy cooking methods include roasting, grilling, broiling, baking, poaching, steaming, or stir-frying. Talk to a dietitian to learn more about healthy cooking methods. °· Limit fluids if directed by your caregiver. Fluid restriction may reduce symptoms of heart failure in some people. °· Weigh yourself every day. Daily weights are important in the early recognition of excess fluid. You should weigh yourself every morning after you urinate and before you eat breakfast. Wear the same amount of clothing each time you weigh yourself. Record your daily weight. Provide your caregiver with your weight record. °· Monitor and record your blood pressure if directed by your caregiver. °· Check your pulse if directed by your caregiver. °· Lose weight if directed   by your caregiver. Weight loss may reduce symptoms of heart failure in some people. °· Stop smoking or chewing tobacco. Nicotine makes your heart work harder by causing your blood vessels to constrict. Do not use nicotine gum or patches before talking to your caregiver. °· Schedule and attend follow-up visits as  directed by your caregiver. It is important to keep all your appointments. °· Limit alcohol intake to no more than 1 drink per day for nonpregnant women and 2 drinks per day for men. Drinking more than that is harmful to your heart. Tell your caregiver if you drink alcohol several times a week. Talk with your caregiver about whether alcohol is safe for you. If your heart has already been damaged by alcohol or you have severe heart failure, drinking alcohol should be stopped completely. °· Stop illicit drug use. °· Stay up-to-date with immunizations. It is especially important to prevent respiratory infections through current pneumococcal and influenza immunizations. °· Manage other health conditions such as hypertension, diabetes, thyroid disease, or abnormal heart rhythms as directed by your caregiver. °· Learn to manage stress. °· Plan rest periods when fatigued. °· Learn strategies to manage high temperatures. If the weather is extremely hot: °· Avoid vigorous physical activity. °· Use air conditioning or fans or seek a cooler location. °· Avoid caffeine and alcohol. °· Wear loose-fitting, lightweight, and light-colored clothing. °· Learn strategies to manage cold temperatures. If the weather is extremely cold: °· Avoid vigorous physical activity. °· Layer clothes. °· Wear mittens or gloves, a hat, and a scarf when going outside. °· Avoid alcohol. °· Obtain ongoing education and support as needed. °· Participate or seek rehabilitation as needed to maintain or improve independence and quality of life. °SEEK MEDICAL CARE IF:  °· Your weight increases by 03 lb/1.4 kg in 1 day or 05 lb/2.3 kg in a week. °· You have increasing shortness of breath that is unusual for you. °· You are unable to participate in your usual physical activities. °· You tire easily. °· You cough more than normal, especially with physical activity. °· You have any or more swelling in areas such as your hands, feet, ankles, or abdomen. °· You  are unable to sleep because it is hard to breathe. °· You feel like your heart is beating fast (palpitations). °· You become dizzy or lightheaded upon standing up. °SEEK IMMEDIATE MEDICAL CARE IF:  °· You have difficulty breathing. °· There is a change in mental status such as decreased alertness or difficulty with concentration. °· You have a pain or discomfort in your chest. °· You have an episode of fainting (syncope). °MAKE SURE YOU:  °· Understand these instructions. °· Will watch your condition. °· Will get help right away if you are not doing well or get worse. °Document Released: 12/31/2004 Document Revised: 04/27/2012 Document Reviewed: 01/23/2012 °[employer]® Patient Information ©2014 [employer]. ° °

## 2013-06-29 NOTE — Progress Notes (Signed)
Pre visit review using our clinic review tool, if applicable. No additional management support is needed unless otherwise documented below in the visit note. 

## 2013-06-29 NOTE — Assessment & Plan Note (Signed)
Her BP is well controlled 

## 2013-06-30 ENCOUNTER — Telehealth: Payer: Self-pay | Admitting: Internal Medicine

## 2013-06-30 NOTE — Telephone Encounter (Signed)
Relevant patient education mailed to patient.  

## 2013-07-05 ENCOUNTER — Other Ambulatory Visit: Payer: Self-pay | Admitting: *Deleted

## 2013-07-05 MED ORDER — PANTOPRAZOLE SODIUM 40 MG PO TBEC: 40.0000 mg | DELAYED_RELEASE_TABLET | Freq: Every day | ORAL | Status: DC

## 2013-07-05 MED ORDER — METOPROLOL TARTRATE 25 MG PO TABS: 25.0000 mg | ORAL_TABLET | Freq: Two times a day (BID) | ORAL | Status: DC

## 2013-07-05 MED ORDER — DIGOXIN 125 MCG PO TABS: 0.1250 mg | ORAL_TABLET | Freq: Every day | ORAL | Status: DC

## 2013-07-05 MED ORDER — SPIRONOLACTONE 25 MG PO TABS: 25.0000 mg | ORAL_TABLET | Freq: Every day | ORAL | Status: DC

## 2013-07-05 MED ORDER — PRAVASTATIN SODIUM 20 MG PO TABS: 20.0000 mg | ORAL_TABLET | Freq: Every day | ORAL | Status: DC

## 2013-07-05 MED ORDER — APIXABAN 2.5 MG PO TABS: 2.5000 mg | ORAL_TABLET | Freq: Two times a day (BID) | ORAL | Status: DC

## 2013-07-05 NOTE — Telephone Encounter (Signed)
Left msg on triage md forgot to send refills on mom medicine. Called robert back verified meds & pharmacy. Inform him [facility].../lmb

## 2013-07-21 DIAGNOSIS — I495 Sick sinus syndrome: Secondary | ICD-10-CM | POA: Diagnosis not present

## 2013-08-03 ENCOUNTER — Ambulatory Visit (INDEPENDENT_AMBULATORY_CARE_PROVIDER_SITE_OTHER): Payer: Medicare Other | Admitting: Internal Medicine

## 2013-08-03 ENCOUNTER — Encounter: Payer: Self-pay | Admitting: Internal Medicine

## 2013-08-03 VITALS — BP 138/68 | HR 70 | Ht 65.0 in | Wt 160.0 lb

## 2013-08-03 DIAGNOSIS — I4891 Unspecified atrial fibrillation: Secondary | ICD-10-CM | POA: Diagnosis not present

## 2013-08-03 DIAGNOSIS — I10 Essential (primary) hypertension: Secondary | ICD-10-CM | POA: Diagnosis not present

## 2013-08-03 DIAGNOSIS — Z950 Presence of cardiac pacemaker: Secondary | ICD-10-CM | POA: Insufficient documentation

## 2013-08-03 DIAGNOSIS — E785 Hyperlipidemia, unspecified: Secondary | ICD-10-CM

## 2013-08-03 DIAGNOSIS — I480 Paroxysmal atrial fibrillation: Secondary | ICD-10-CM

## 2013-08-03 NOTE — Progress Notes (Signed)
OFFICE NOTE  Chief Complaint:  Establish New Cardiologist  Primary Care Physician: Deborah Linger, MD  HPI:  Deborah Chung is a pleasant 80 year old female who recently moved to [place] from [place]. She was apparently living in [place], somewhere outside of [place]. She was previously seeing [facility] and had an episode in February of 2015. Apparently she had a mobile health screening and was found to be out of rhythm. She was sent to her primary care doctor who did an EKG and noted she was in A. fib and was sent immediately to the hospital. As the story goes, she was on several treatments for her A. fib and apparently eventually underwent cardiac catheterization and/or possibly an ablation procedure for which she developed asystole or some type of arrhythmia requiring her to be resuscitated. Obviously this was successful and she received apparently a pacemaker. She is not clear as to what the type of pacemaker wasn't did not provide documentation today. I've not yet received any records from her cardiologist in [place]. She was placed on Eliquis for anticoagulation. In addition, it appears she is on digoxin and Aldactone along with Lopressor which is suggestive of possible cardiomyopathy. She apparently does have a heart failure diagnosis however I'm not certain as to what her ejection fraction is. She reports a remote check device for her pacemaker and did a remote check apparently last week which went to [place]. Symptomatically she denies any chest pain or shortness of breath.  PMHx:  Past Medical History  Diagnosis Date  . Hypertension   . CHF (congestive heart failure)   . Hyperlipidemia   . PHN (postherpetic neuralgia) 2010    Past Surgical History  Procedure Laterality Date  . Abdominal hysterectomy      FAMHx:  Family History  Problem Relation Age of Onset  . Family history unknown: Yes    SOCHx:   reports that she quit smoking  about 25 years ago. She has never used smokeless tobacco. She reports that she does not drink alcohol or use illicit drugs.  ALLERGIES:  No Known Allergies  ROS: A comprehensive review of systems was negative.  HOME MEDS: Current Outpatient Prescriptions  Medication Sig Dispense Refill  . ALPRAZolam (XANAX) 0.25 MG tablet Take 1 tablet (0.25 mg total) by mouth daily as needed for anxiety.  30 tablet  5  . apixaban (ELIQUIS) 2.5 MG TABS tablet Take 1 tablet (2.5 mg total) by mouth 2 (two) times daily.  180 tablet  3  . aspirin 81 MG tablet Take 81 mg by mouth daily.      . digoxin (LANOXIN) 0.125 MG tablet Take 1 tablet (0.125 mg total) by mouth daily.  90 tablet  3  . DiphenhydrAMINE HCl, Sleep, 50 MG CAPS Take by mouth.      . docusate sodium (COLACE) 100 MG capsule Take 100 mg by mouth daily as needed.       . metoprolol tartrate (LOPRESSOR) 25 MG tablet Take 1 tablet (25 mg total) by mouth 2 (two) times daily.  180 tablet  3  . pantoprazole (PROTONIX) 40 MG tablet Take 1 tablet (40 mg total) by mouth daily.  90 tablet  3  . pravastatin (PRAVACHOL) 20 MG tablet Take 1 tablet (20 mg total) by mouth daily.  90 tablet  3  . pregabalin (LYRICA) 75 MG capsule Take 1 capsule (75 mg total) by mouth 2 (two) times daily. Take 1 capsule every 12 hrs  180 capsule  1  .  spironolactone (ALDACTONE) 25 MG tablet Take 1 tablet (25 mg total) by mouth daily.  90 tablet  3   No current facility-administered medications for this visit.    LABS/IMAGING: No results found for this or any previous visit (from the past 48 hour(s)). No results found.  VITALS: BP 138/68  Pulse 70  Ht 5\' 5"  (1.651 m)  Wt 160 lb (72.576 kg)  BMI 26.63 kg/m2  EXAM: General appearance: alert and no distress Neck: no carotid bruit and no JVD Lungs: clear to auscultation bilaterally and pacemaker in well-healed pocket in the left upper chest Heart: regular rate and rhythm, S1, S2 normal, no murmur, click, rub or  gallop Abdomen: soft, non-tender; bowel sounds normal; no masses,  no organomegaly Extremities: extremities normal, atraumatic, no cyanosis or edema Pulses: 2+ and symmetric Skin: Skin color, texture, turgor normal. No rashes or lesions Neurologic: Grossly normal Psych: NOrmal  EKG: Atrial paced rhythm at 70, anteroseptal infarct pattern  ASSESSMENT: 1. History of atrial fibrillation on anticoagulation 2. Hypertension 3. Dyslipidemia 4. Possible systolic heart failure 5. Status post pacemaker  PLAN: 1.  Deborah Chung has a number of cardiac issues which seem to date back to February of this year. We have requested records but not yet received them from her cardiologist in [place]. I've scheduled her to be followed up in our device clinic and they will contact her for the name of her pacemaker and we will arrange for her home monitor to be sent to our office. She will also need followup with one of our cardiac electrophysiologists. Her medications appear appropriate at this time for what I know about her history. Should there be a need to change her medications I would anticipate doing that after I've had a chance to review her history more. For now would recommend continuing current medications and plan to see her back in 6 months or sooner as necessary. By that time she'll be rolled in our pacemaker clinic and device checks will be performed as necessary.  Thanks for the kind referral.  Chrystie NoseKenneth C. Aishi Courts, MD, [facility] Attending Cardiologist [facility]  Rodert Hinch C 08/03/2013, 5:20 PM

## 2013-08-03 NOTE — Patient Instructions (Signed)
Your physician recommends that you schedule a follow-up appointment in 3 months with Dr. Royann Shivers.   Your physician recommends that you schedule a follow-up appointment in 6 months with Dr. Rennis Golden.

## 2013-08-04 DIAGNOSIS — H905 Unspecified sensorineural hearing loss: Secondary | ICD-10-CM | POA: Diagnosis not present

## 2013-08-04 DIAGNOSIS — H903 Sensorineural hearing loss, bilateral: Secondary | ICD-10-CM | POA: Diagnosis not present

## 2013-08-11 ENCOUNTER — Telehealth: Payer: Self-pay | Admitting: *Deleted

## 2013-08-11 NOTE — Telephone Encounter (Signed)
Spoke w/Robert Montez Morita (pt's son). He stated that he was unsure of the type of device his mother has. He stated that he would talk to her and find out what she has. He asked if I could call him on Friday to get the information.

## 2013-10-03 DIAGNOSIS — Z23 Encounter for immunization: Secondary | ICD-10-CM | POA: Diagnosis not present

## 2013-11-15 ENCOUNTER — Encounter: Payer: Self-pay | Admitting: Internal Medicine

## 2013-11-15 ENCOUNTER — Ambulatory Visit (INDEPENDENT_AMBULATORY_CARE_PROVIDER_SITE_OTHER): Payer: Medicare Other | Admitting: Internal Medicine

## 2013-11-15 VITALS — BP 132/76 | HR 70 | Ht 65.0 in | Wt 164.4 lb

## 2013-11-15 DIAGNOSIS — E785 Hyperlipidemia, unspecified: Secondary | ICD-10-CM | POA: Diagnosis not present

## 2013-11-15 DIAGNOSIS — Z950 Presence of cardiac pacemaker: Secondary | ICD-10-CM

## 2013-11-15 DIAGNOSIS — I10 Essential (primary) hypertension: Secondary | ICD-10-CM | POA: Diagnosis not present

## 2013-11-15 DIAGNOSIS — I480 Paroxysmal atrial fibrillation: Secondary | ICD-10-CM

## 2013-11-15 LAB — MDC_IDC_ENUM_SESS_TYPE_INCLINIC
Brady Statistic RA Percent Paced: 100 %
Brady Statistic RV Percent Paced: 95 %
Date Time Interrogation Session: 20151102050000
Implantable Pulse Generator Serial Number: 391753
Lead Channel Impedance Value: 635 Ohm
Lead Channel Impedance Value: 910 Ohm
Lead Channel Pacing Threshold Amplitude: 0.3 V
Lead Channel Pacing Threshold Amplitude: 1.1 V
Lead Channel Pacing Threshold Pulse Width: 0.5 ms
Lead Channel Pacing Threshold Pulse Width: 0.5 ms
Lead Channel Sensing Intrinsic Amplitude: 15.8 mV
Lead Channel Setting Pacing Amplitude: 1.2 V
Lead Channel Setting Pacing Amplitude: 2.4 V
Lead Channel Setting Pacing Pulse Width: 0.4 ms
Lead Channel Setting Sensing Sensitivity: 3 mV
Zone Setting Detection Interval: 375 ms

## 2013-11-15 NOTE — Patient Instructions (Signed)
Remote monitoring is used to monitor your Pacemaker or ICD from home. This monitoring reduces the number of office visits required to check your device to one time per year. It allows us to keep an eye on the functioning of your device to ensure it is working properly. You are scheduled for a device check from home on 02/16/14. You may send your transmission at any time that day. If you have a wireless device, the transmission will be sent automatically. After your physician reviews your transmission, you will receive a postcard with your next transmission date.    Your physician wants you to follow-up in: 12 months with Dr Taylor You will receive a reminder letter in the mail two months in advance. If you don't receive a letter, please call our office to schedule the follow-up appointment.  

## 2013-11-17 ENCOUNTER — Encounter: Payer: Self-pay | Admitting: Internal Medicine

## 2013-11-17 NOTE — Assessment & Plan Note (Signed)
Her [facility] DDD PM is working normally. Will recheck in several months. 

## 2013-11-17 NOTE — Assessment & Plan Note (Signed)
She is maintaining NSR very nicely and will continue her current meds.

## 2013-11-17 NOTE — Assessment & Plan Note (Signed)
She is encouraged to maintain a low fat diet. And take her statin therapy. Dr. Rennis Golden will follow her lipids.

## 2013-11-17 NOTE — Progress Notes (Signed)
HPI Deborah Chung is referred today by Dr. Rennis GoldenHilty for ongoing evaluation and management of her DDD PM. 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 She underwent insertion of a [employer] DDD PM in [place] several years ago. [place] has [place] to be closer to family. Her son Deborah Chung is our hospital counsel. She developed shingles approx. 5 years ago and is bothered by residual neuralgia. She has a h/o atrial fibrillation and is anti-coagulated. She remains active and has very little if any limitation despite her advanced age. She denies chest pain.  No Known Allergies   Current Outpatient Prescriptions  Medication Sig Dispense Refill  . ALPRAZolam (XANAX) 0.25 MG tablet Take 1 tablet (0.25 mg total) by mouth daily as needed for anxiety. 30 tablet 5  . apixaban (ELIQUIS) 2.5 MG TABS tablet Take 1 tablet (2.5 mg total) by mouth 2 (two) times daily. 180 tablet 3  . aspirin 81 MG tablet Take 81 mg by mouth daily.    . digoxin (LANOXIN) 0.125 MG tablet Take 1 tablet (0.125 mg total) by mouth daily. 90 tablet 3  . DiphenhydrAMINE HCl, Sleep, 50 MG CAPS Take by mouth.    . metoprolol tartrate (LOPRESSOR) 25 MG tablet Take 1 tablet (25 mg total) by mouth 2 (two) times daily. 180 tablet 3  . pantoprazole (PROTONIX) 40 MG tablet Take 1 tablet (40 mg total) by mouth daily. 90 tablet 3  . pravastatin (PRAVACHOL) 20 MG tablet Take 1 tablet (20 mg total) by mouth daily. 90 tablet 3  . pregabalin (LYRICA) 75 MG capsule Take 1 capsule (75 mg total) by mouth 2 (two) times daily. Take 1 capsule every 12 hrs 180 capsule 1  . spironolactone (ALDACTONE) 25 MG tablet Take 1 tablet (25 mg total) by mouth daily. 90 tablet 3   No current facility-administered medications for this visit.     Past Medical History  Diagnosis Date  . Hypertension   . CHF (congestive heart failure)   . Hyperlipidemia   . PHN (postherpetic neuralgia) 2010  . GAD (generalized anxiety  disorder)   . Systolic CHF   . PHN (postherpetic neuralgia)   . Hearing loss   . Dyslipidemia   . Essential hypertension, benign   . Atrial fibrillation     ROS:   All systems reviewed and negative except as noted in the HPI.   Past Surgical History  Procedure Laterality Date  . Abdominal hysterectomy    . Pacemaker placement       History reviewed. No pertinent family history.   History   Social History  . Marital Status: Unknown    Spouse Name: N/A    Number of Children: N/A  . Years of Education: N/A   Occupational History  . Not on file.   Social History Main Topics  . Smoking status: Former Smoker -- 25 years    Quit date: 08/03/1988  . Smokeless tobacco: Never Used  . Alcohol Use: No  . Drug Use: No  . Sexual Activity: Not Currently   Other Topics Concern  . Not on file   Social History Narrative     BP 132/76 mmHg  Pulse 70  Ht 5\' 5"  (1.651 m)  Wt 164 lb 6.4 oz (74.571 kg)  BMI 27.36 kg/m2  Physical Exam:  Well appearing elderly woman, NAD HEENT: Unremarkable Neck:  6 cm JVD, no thyromegally Back:  No CVA tenderness Lungs:  Clear  with no wheezes HEART:  Regular rate rhythm, no murmurs, no rubs, no clicks Abd:  soft, positive bowel sounds, no organomegally, no rebound, no guarding Ext:  2 plus pulses, no edema, no cyanosis, no clubbing Skin:  No rashes no nodules Neuro:  CN II through XII intact, motor grossly intact   DEVICE  Normal device function.  See PaceArt for details.   Assess/Plan:

## 2013-11-17 NOTE — Assessment & Plan Note (Signed)
Her blood pressure is well controlled. She will continue her current meds. She will maintain a low sodium diet.

## 2013-12-17 ENCOUNTER — Telehealth: Payer: Self-pay | Admitting: Internal Medicine

## 2013-12-17 DIAGNOSIS — B0229 Other postherpetic nervous system involvement: Secondary | ICD-10-CM

## 2013-12-17 MED ORDER — PREGABALIN 75 MG PO CAPS: 75.0000 mg | ORAL_CAPSULE | Freq: Three times a day (TID) | ORAL | Status: DC

## 2013-12-17 NOTE — Telephone Encounter (Signed)
Pt called in and wanted to see if Dr Yetta Barre could increase her Lyrica to 3 times a day instead of 2 a day.  She said that the 2 isnt working anymore.    Best number [phone]

## 2013-12-17 NOTE — Telephone Encounter (Signed)
Yes. See Rx

## 2013-12-17 NOTE — Telephone Encounter (Signed)
Reprinted rx with old instruction attach. Notified pt md ok rx will leave up front for pick-up...Raechel Chute

## 2014-01-17 ENCOUNTER — Telehealth: Payer: Self-pay | Admitting: Internal Medicine

## 2014-01-17 NOTE — Telephone Encounter (Signed)
Pt does not have a home monitor. I informed her that I would call tech services and have a monitor ordered and shipped to her. Pt verbalized understanding. Called tech services w/ [employer] and had home monitor ordered for pt.

## 2014-01-17 NOTE — Telephone Encounter (Signed)
New message    Patient calling has not heard anything regarding pacemaker.

## 2014-01-17 NOTE — Telephone Encounter (Signed)
LMOVM for pt to return call 

## 2014-02-03 ENCOUNTER — Telehealth: Payer: Self-pay | Admitting: Internal Medicine

## 2014-02-03 NOTE — Telephone Encounter (Signed)
Close encounter 

## 2014-02-04 ENCOUNTER — Ambulatory Visit: Payer: BC Managed Care – PPO | Admitting: Internal Medicine

## 2014-02-07 ENCOUNTER — Encounter: Payer: Self-pay | Admitting: Internal Medicine

## 2014-02-07 ENCOUNTER — Ambulatory Visit (INDEPENDENT_AMBULATORY_CARE_PROVIDER_SITE_OTHER): Payer: Medicare Other | Admitting: Internal Medicine

## 2014-02-07 VITALS — BP 136/60 | HR 70 | Ht 65.0 in | Wt 171.6 lb

## 2014-02-07 DIAGNOSIS — E785 Hyperlipidemia, unspecified: Secondary | ICD-10-CM | POA: Diagnosis not present

## 2014-02-07 DIAGNOSIS — Z950 Presence of cardiac pacemaker: Secondary | ICD-10-CM

## 2014-02-07 DIAGNOSIS — I10 Essential (primary) hypertension: Secondary | ICD-10-CM | POA: Diagnosis not present

## 2014-02-07 DIAGNOSIS — I428 Other cardiomyopathies: Secondary | ICD-10-CM

## 2014-02-07 DIAGNOSIS — I429 Cardiomyopathy, unspecified: Secondary | ICD-10-CM | POA: Diagnosis not present

## 2014-02-07 DIAGNOSIS — I480 Paroxysmal atrial fibrillation: Secondary | ICD-10-CM | POA: Diagnosis not present

## 2014-02-07 NOTE — Patient Instructions (Addendum)
Your physician wants you to follow-up in: 6 months with Dr. Rennis Golden. You will receive a reminder letter in the mail two months in advance. If you don't receive a letter, please call our office to schedule the follow-up appointment.  Your physician has requested that you have an echocardiogram. Echocardiography is a painless test that uses sound waves to create images of your heart. It provides your doctor with information about the size and shape of your heart and how well your heart's chambers and valves are working. This procedure takes approximately one hour. There are no restrictions for this procedure.

## 2014-02-07 NOTE — Progress Notes (Signed)
OFFICE NOTE  Chief Complaint:  Establish New Cardiologist  Primary Care Physician: Sanda Linger, MD  HPI:  Deborah Chung is a pleasant 81 year old female who recently moved to [place] from [place]. She was apparently living in [place], somewhere outside of [place]. Her son is Lacole Cumbo, who is the in-house counsel for the [facility] system. She was previously seeing [facility] and had an episode in February of 2015. Apparently she had a mobile health screening and was found to be out of rhythm. She was sent to her primary care doctor who did an EKG and noted she was in A. fib and was sent immediately to the hospital. As the story goes, she was on several treatments for her A. fib and apparently eventually underwent cardiac catheterization and/or possibly an ablation procedure for which she developed asystole or some type of arrhythmia requiring her to be resuscitated. Obviously this was successful and she received apparently a pacemaker. She is not clear as to what the type of pacemaker wasn't did not provide documentation today. I've not yet received any records from her cardiologist in [place]. She was placed on Eliquis for anticoagulation. In addition, it appears she is on digoxin and Aldactone along with Lopressor which is suggestive of possible cardiomyopathy. She apparently does have a heart failure diagnosis however I'm not certain as to what her ejection fraction is. She reports a remote check device for her pacemaker and did a remote check apparently last week which went to [place]. Symptomatically she denies any chest pain or shortness of breath.  I saw Deborah Chung back today in the office. She is doing fairly well. She saw Dr. Lewayne Bunting in November who performed a pacemaker check and felt that she was doing well in sinus rhythm. There is no evidence for recurrent atrial fibrillation. She has yet to be enrolled in home monitoring. Twice  she has requested a home monitor but it has not been yet sent to her. I will need to look into why she has not received her home monitoring device. 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 Eyes a chest pain or worsening shortness of breath. As mentioned recent laboratory work from her primary shows excellent cholesterol control with total cholesterol 174, triglycerides 124, HDL 59 and LDL of 90.  PMHx:  Past Medical History  Diagnosis Date  . Hypertension   . CHF (congestive heart failure)   . Hyperlipidemia   . PHN (postherpetic neuralgia) 2010  . GAD (generalized anxiety disorder)   . Systolic CHF   . PHN (postherpetic neuralgia)   . Hearing loss   . Dyslipidemia   . Essential hypertension, benign   . Atrial fibrillation     Past Surgical History  Procedure Laterality Date  . Abdominal hysterectomy    . Pacemaker placement      FAMHx:  No family history on file.  SOCHx:   reports that she quit smoking about 25 years ago. She has never used smokeless tobacco. She reports that she does not drink alcohol or use illicit drugs.  ALLERGIES:  No Known Allergies  ROS: A comprehensive review of systems was negative.  HOME MEDS: Current Outpatient Prescriptions  Medication Sig Dispense Refill  . ALPRAZolam (XANAX) 0.25 MG tablet Take 1 tablet (0.25 mg total) by mouth daily as needed for anxiety. 30 tablet 5  . apixaban (ELIQUIS) 2.5 MG TABS tablet Take 1 tablet (2.5 mg total) by mouth 2 (two) times daily. 180 tablet 3  .  aspirin 81 MG tablet Take 81 mg by mouth daily.    . Biotin 10 MG TABS Take 1 tablet by mouth daily.    . digoxin (LANOXIN) 0.125 MG tablet Take 1 tablet (0.125 mg total) by mouth daily. 90 tablet 3  . DiphenhydrAMINE HCl, Sleep, 50 MG CAPS Take by mouth.    . metoprolol tartrate (LOPRESSOR) 25 MG tablet Take 1 tablet (25 mg total) by mouth 2 (two) times daily. 180 tablet 3  . pantoprazole (PROTONIX) 40 MG tablet Take 1 tablet (40 mg total) by mouth daily.  90 tablet 3  . pravastatin (PRAVACHOL) 20 MG tablet Take 1 tablet (20 mg total) by mouth daily. 90 tablet 3  . pregabalin (LYRICA) 75 MG capsule Take 1 capsule (75 mg total) by mouth 3 (three) times daily. 270 capsule 1  . spironolactone (ALDACTONE) 25 MG tablet Take 1 tablet (25 mg total) by mouth daily. 90 tablet 3   No current facility-administered medications for this visit.    LABS/IMAGING: No results found for this or any previous visit (from the past 48 hour(s)). No results found.  VITALS: BP 136/60 mmHg  Pulse 70  Ht  (1.651 m)  Wt 171 lb 9.6 oz (77.837 kg)  BMI 28.56 kg/m2  EXAM: General appearance: alert and no distress Neck: no carotid bruit and no JVD Lungs: clear to auscultation bilaterally and pacemaker in well-healed pocket in the left upper chest Heart: regular rate and rhythm, S1, S2 normal, no murmur, click, rub or gallop Abdomen: soft, non-tender; bowel sounds normal; no masses,  no organomegaly Extremities: extremities normal, atraumatic, no cyanosis or edema Pulses: 2+ and symmetric Skin: Skin color, texture, turgor normal. No rashes or lesions Neurologic: Grossly normal Psych: NOrmal  EKG: Atrial paced rhythm at 70, anteroseptal infarct pattern  ASSESSMENT: 1. History of atrial fibrillation on anticoagulation 2. Hypertension 3. Dyslipidemia 4. Status post pacemaker - for sinus node dysfunction and complete heart block 5. Reported history of EF of 10-15%  PLAN: 1.  Deborah Chung has normal pacemaker function and is feeling well. Blood pressure is well controlled and her cholesterol is at goal on current medications. There is no evidence for ongoing atrial fibrillation. She has not yet been set up with their home monitoring device and we will arrange that today. She has an upcoming remote device check on February 3. I contacted the CHF and heart care device clinic and they are working on arranging the device. Finally, there was a reported history of EF  of 10-15%. Is not clear from records from her prior cardiologist whether there is been reassessment of LV function. I would recommend a repeat echocardiogram to see if her EF has improved. Plan to see her back in 6 months.  Chrystie Nose, MD, [facility] Attending Cardiologist [facility]  HILTY,Kenneth C 02/07/2014, 4:13 PM

## 2014-02-08 ENCOUNTER — Telehealth: Payer: Self-pay | Admitting: Cardiology

## 2014-02-08 NOTE — Telephone Encounter (Signed)
Spoke w/ pt son and informed him that home monitor from [employer] is on back order and pt should receive home monitor around early February. Pt son verbalized understanding.

## 2014-02-09 ENCOUNTER — Telehealth: Payer: Self-pay | Admitting: *Deleted

## 2014-02-09 NOTE — Telephone Encounter (Signed)
LMTCB - was going to inform patient that Dr. Rennis Golden ordered echo - echo scheduled for 2/1

## 2014-02-14 ENCOUNTER — Ambulatory Visit (HOSPITAL_COMMUNITY)
Admission: RE | Admit: 2014-02-14 | Discharge: 2014-02-14 | Disposition: A | Discharge status: Home / Self Care | Payer: Medicare Other | Source: Ambulatory Visit | Attending: Internal Medicine | Admitting: Internal Medicine

## 2014-02-14 DIAGNOSIS — Z8249 Family history of ischemic heart disease and other diseases of the circulatory system: Secondary | ICD-10-CM | POA: Insufficient documentation

## 2014-02-14 DIAGNOSIS — I480 Paroxysmal atrial fibrillation: Secondary | ICD-10-CM

## 2014-02-14 DIAGNOSIS — I429 Cardiomyopathy, unspecified: Secondary | ICD-10-CM | POA: Diagnosis not present

## 2014-02-14 DIAGNOSIS — E785 Hyperlipidemia, unspecified: Secondary | ICD-10-CM | POA: Diagnosis not present

## 2014-02-14 DIAGNOSIS — Z87891 Personal history of nicotine dependence: Secondary | ICD-10-CM | POA: Insufficient documentation

## 2014-02-14 DIAGNOSIS — I428 Other cardiomyopathies: Secondary | ICD-10-CM

## 2014-02-14 DIAGNOSIS — I10 Essential (primary) hypertension: Secondary | ICD-10-CM | POA: Insufficient documentation

## 2014-02-14 NOTE — Progress Notes (Signed)
2D Echocardiogram Complete.  02/14/2014   Erinn Huskins, RDCS  

## 2014-02-16 ENCOUNTER — Telehealth: Payer: Self-pay | Admitting: Cardiology

## 2014-02-16 ENCOUNTER — Encounter: Payer: Medicare Other | Admitting: *Deleted

## 2014-02-16 NOTE — Telephone Encounter (Signed)
LMOVM reminding pt to send remote transmission.   

## 2014-02-21 ENCOUNTER — Other Ambulatory Visit: Payer: Self-pay | Admitting: Internal Medicine

## 2014-02-22 ENCOUNTER — Other Ambulatory Visit: Payer: Self-pay | Admitting: Internal Medicine

## 2014-03-02 ENCOUNTER — Encounter: Payer: Medicare Other | Admitting: *Deleted

## 2014-03-02 ENCOUNTER — Telehealth: Payer: Self-pay | Admitting: Internal Medicine

## 2014-03-02 NOTE — Telephone Encounter (Signed)
New message     Pt is due for a remote device check today. She has not received her box.  Will call us when she gets it

## 2014-03-02 NOTE — Telephone Encounter (Signed)
Spoke w/ pt and informed her that I spoke w/ [employer] and they informed me to call back on Thursday to see if pt home monitor would be shipped out this week. Pt verbalized understanding.

## 2014-03-14 ENCOUNTER — Ambulatory Visit (INDEPENDENT_AMBULATORY_CARE_PROVIDER_SITE_OTHER): Payer: Medicare Other | Admitting: *Deleted

## 2014-03-14 DIAGNOSIS — I429 Cardiomyopathy, unspecified: Secondary | ICD-10-CM | POA: Diagnosis not present

## 2014-03-14 DIAGNOSIS — I428 Other cardiomyopathies: Secondary | ICD-10-CM

## 2014-03-16 NOTE — Progress Notes (Signed)
Remote pacemaker transmission.   

## 2014-03-18 DIAGNOSIS — H6122 Impacted cerumen, left ear: Secondary | ICD-10-CM | POA: Diagnosis not present

## 2014-03-21 DIAGNOSIS — H6122 Impacted cerumen, left ear: Secondary | ICD-10-CM | POA: Diagnosis not present

## 2014-03-21 LAB — MDC_IDC_ENUM_SESS_TYPE_REMOTE
Battery Remaining Longevity: 84 mo
Battery Remaining Percentage: 100 %
Brady Statistic RA Percent Paced: 99 %
Brady Statistic RV Percent Paced: 87 %
Date Time Interrogation Session: 20160302081000
Implantable Pulse Generator Serial Number: 391753
Lead Channel Impedance Value: 514 Ohm
Lead Channel Impedance Value: 880 Ohm
Lead Channel Setting Pacing Amplitude: 1.1 V
Lead Channel Setting Pacing Amplitude: 2.4 V
Lead Channel Setting Pacing Pulse Width: 0.4 ms
Lead Channel Setting Sensing Sensitivity: 3 mV
Zone Setting Detection Interval: 375 ms

## 2014-03-31 ENCOUNTER — Encounter: Payer: Self-pay | Admitting: Cardiology

## 2014-04-06 ENCOUNTER — Encounter: Payer: Self-pay | Admitting: Internal Medicine

## 2014-04-24 ENCOUNTER — Other Ambulatory Visit: Payer: Self-pay | Admitting: Internal Medicine

## 2014-05-11 ENCOUNTER — Other Ambulatory Visit: Payer: Self-pay | Admitting: Internal Medicine

## 2014-05-13 ENCOUNTER — Telehealth: Payer: Self-pay | Admitting: Internal Medicine

## 2014-05-13 NOTE — Telephone Encounter (Signed)
Disregard. I went ahead and made her an appointment

## 2014-05-16 ENCOUNTER — Other Ambulatory Visit (INDEPENDENT_AMBULATORY_CARE_PROVIDER_SITE_OTHER): Payer: Medicare Other

## 2014-05-16 ENCOUNTER — Ambulatory Visit (INDEPENDENT_AMBULATORY_CARE_PROVIDER_SITE_OTHER): Payer: Medicare Other | Admitting: Internal Medicine

## 2014-05-16 ENCOUNTER — Encounter: Payer: Self-pay | Admitting: Internal Medicine

## 2014-05-16 ENCOUNTER — Ambulatory Visit (INDEPENDENT_AMBULATORY_CARE_PROVIDER_SITE_OTHER)
Admission: RE | Admit: 2014-05-16 | Discharge: 2014-05-16 | Disposition: A | Discharge status: Home / Self Care | Payer: Medicare Other | Source: Ambulatory Visit | Attending: Internal Medicine | Admitting: Internal Medicine

## 2014-05-16 VITALS — BP 116/68 | HR 64 | Temp 97.8°F | Resp 16 | Ht 65.0 in | Wt 174.0 lb

## 2014-05-16 DIAGNOSIS — I5022 Chronic systolic (congestive) heart failure: Secondary | ICD-10-CM | POA: Diagnosis not present

## 2014-05-16 DIAGNOSIS — N3000 Acute cystitis without hematuria: Secondary | ICD-10-CM | POA: Diagnosis not present

## 2014-05-16 DIAGNOSIS — I480 Paroxysmal atrial fibrillation: Secondary | ICD-10-CM

## 2014-05-16 DIAGNOSIS — I10 Essential (primary) hypertension: Secondary | ICD-10-CM

## 2014-05-16 DIAGNOSIS — S8012XD Contusion of left lower leg, subsequent encounter: Secondary | ICD-10-CM | POA: Diagnosis not present

## 2014-05-16 DIAGNOSIS — M25562 Pain in left knee: Secondary | ICD-10-CM | POA: Insufficient documentation

## 2014-05-16 DIAGNOSIS — E785 Hyperlipidemia, unspecified: Secondary | ICD-10-CM | POA: Diagnosis not present

## 2014-05-16 LAB — LIPID PANEL
Cholesterol: 172 mg/dL (ref 0–200)
HDL: 41.1 mg/dL (ref >39.00)
NonHDL: 130.9
Total CHOL/HDL Ratio: 4
Triglycerides: 231 mg/dL — ABNORMAL HIGH (ref 0.0–149.0)
VLDL: 46.2 mg/dL — ABNORMAL HIGH (ref 0.0–40.0)

## 2014-05-16 LAB — CBC WITH DIFFERENTIAL/PLATELET
Basophils Absolute: 0 10*3/uL (ref 0.0–0.1)
Basophils Relative: 0.4 % (ref 0.0–3.0)
Eosinophils Absolute: 0.3 10*3/uL (ref 0.0–0.7)
Eosinophils Relative: 3.5 % (ref 0.0–5.0)
HCT: 41.2 % (ref 36.0–46.0)
Hemoglobin: 14.1 g/dL (ref 12.0–15.0)
Lymphocytes Relative: 32.3 % (ref 12.0–46.0)
Lymphs Abs: 2.7 10*3/uL (ref 0.7–4.0)
MCHC: 34.3 g/dL (ref 30.0–36.0)
MCV: 94.5 fl (ref 78.0–100.0)
Monocytes Absolute: 0.8 10*3/uL (ref 0.1–1.0)
Monocytes Relative: 10.2 % (ref 3.0–12.0)
Neutro Abs: 4.5 10*3/uL (ref 1.4–7.7)
Neutrophils Relative %: 53.6 % (ref 43.0–77.0)
Platelets: 218 10*3/uL (ref 150.0–400.0)
RBC: 4.36 Mil/uL (ref 3.87–5.11)
RDW: 13.5 % (ref 11.5–15.5)
WBC: 8.3 10*3/uL (ref 4.0–10.5)

## 2014-05-16 LAB — POCT URINALYSIS DIPSTICK
Bilirubin, UA: NEGATIVE
Blood, UA: NEGATIVE
Glucose, UA: NEGATIVE
Ketones, UA: NEGATIVE
Nitrite, UA: NEGATIVE
Protein, UA: 0.15
Spec Grav, UA: 1.025
Urobilinogen, UA: NEGATIVE
pH, UA: 6

## 2014-05-16 LAB — COMPREHENSIVE METABOLIC PANEL
ALT: 13 U/L (ref 0–35)
AST: 16 U/L (ref 0–37)
Albumin: 4 g/dL (ref 3.5–5.2)
Alkaline Phosphatase: 85 U/L (ref 39–117)
BUN: 16 mg/dL (ref 6–23)
CO2: 31 mEq/L (ref 19–32)
Calcium: 9.6 mg/dL (ref 8.4–10.5)
Chloride: 103 mEq/L (ref 96–112)
Creatinine, Ser: 1.11 mg/dL (ref 0.40–1.20)
GFR: 49.53 mL/min — ABNORMAL LOW (ref >60.00)
Glucose, Bld: 101 mg/dL — ABNORMAL HIGH (ref 70–99)
Potassium: 4.8 mEq/L (ref 3.5–5.1)
Sodium: 139 mEq/L (ref 135–145)
Total Bilirubin: 0.5 mg/dL (ref 0.2–1.2)
Total Protein: 7.1 g/dL (ref 6.0–8.3)

## 2014-05-16 LAB — LDL CHOLESTEROL, DIRECT: Direct LDL: 106 mg/dL

## 2014-05-16 LAB — TSH: TSH: 1.4 u[IU]/mL (ref 0.35–4.50)

## 2014-05-16 MED ORDER — CIPROFLOXACIN HCL 250 MG PO TABS: 250.0000 mg | ORAL_TABLET | Freq: Two times a day (BID) | ORAL | Status: DC

## 2014-05-16 NOTE — Progress Notes (Signed)
Pre visit review using our clinic review tool, if applicable. No additional management support is needed unless otherwise documented below in the visit note. 

## 2014-05-16 NOTE — Assessment & Plan Note (Signed)
Will check a urine clx Will start cipro empirically

## 2014-05-16 NOTE — Patient Instructions (Signed)

## 2014-05-17 ENCOUNTER — Encounter: Payer: Self-pay | Admitting: Internal Medicine

## 2014-05-17 NOTE — Progress Notes (Signed)
Subjective:    Patient ID: Deborah Chung, female    DOB: 1926/01/29, 81 y.o.   MRN: [id number]  Hypertension This is a chronic problem. The current episode started more than 1 year ago. The problem is unchanged. The problem is controlled. Pertinent negatives include no anxiety, blurred vision, chest pain, headaches, malaise/fatigue, neck pain, orthopnea, palpitations, peripheral edema, PND, shortness of breath or sweats. Past treatments include beta blockers and diuretics. There are no compliance problems.  Hypertensive end-organ damage includes heart failure.  Urinary Tract Infection  Associated symptoms include frequency. Pertinent negatives include no chills, flank pain, nausea, sweats or vomiting.      Review of Systems  Constitutional: Negative.  Negative for fever, chills, malaise/fatigue, diaphoresis, appetite change and fatigue.  HENT: Negative.   Eyes: Negative.  Negative for blurred vision.  Respiratory: Negative.  Negative for cough, choking, chest tightness, shortness of breath and stridor.   Cardiovascular: Negative.  Negative for chest pain, palpitations, orthopnea, leg swelling and PND.  Gastrointestinal: Negative.  Negative for nausea, vomiting, abdominal pain and diarrhea.  Endocrine: Negative.   Genitourinary: Positive for dysuria and frequency. Negative for flank pain, enuresis, difficulty urinating and dyspareunia.  Musculoskeletal: Positive for arthralgias. Negative for myalgias, back pain, joint swelling and neck pain.       She complains of diffuse LLE pain for about 3-4 weeks, she recalls hitting the lower leg on a rocking chair and a bruise developed. The bruise and swelling have resolved but there is still some discomfort/ she has not taken anything for pain.  Skin: Negative.  Negative for rash.  Allergic/Immunologic: Negative.   Neurological: Negative.  Negative for dizziness, weakness, light-headedness, numbness and headaches.  Hematological: Negative.  Negative  for adenopathy. Does not bruise/bleed easily.  Psychiatric/Behavioral: Negative.        Objective:   Physical Exam  Constitutional: She is oriented to person, place, and time. She appears well-developed and well-nourished. No distress.  HENT:  Head: Normocephalic and atraumatic.  Mouth/Throat: Oropharynx is clear and moist. No oropharyngeal exudate.  Eyes: Conjunctivae are normal. Right eye exhibits no discharge. Left eye exhibits no discharge. No scleral icterus.  Neck: Normal range of motion. Neck supple. No JVD present. No tracheal deviation present. No thyromegaly present.  Cardiovascular: Normal rate, regular rhythm, normal heart sounds and intact distal pulses.  Exam reveals no gallop and no friction rub.   No murmur heard. Pulmonary/Chest: Effort normal and breath sounds normal. No stridor. No respiratory distress. She has no wheezes. She has no rales. She exhibits no tenderness.  Abdominal: Soft. Bowel sounds are normal. She exhibits no distension and no mass. There is no tenderness. There is no rebound and no guarding.  Musculoskeletal: Normal range of motion. She exhibits no edema or tenderness.       Left lower leg: Normal. She exhibits no tenderness, no bony tenderness, no swelling, no edema, no deformity and no laceration.  Lymphadenopathy:    She has no cervical adenopathy.  Neurological: She is oriented to person, place, and time.  Skin: Skin is warm and dry. No rash noted. She is not diaphoretic. No erythema. No pallor.  Psychiatric: She has a normal mood and affect. Her behavior is normal. Judgment and thought content normal.  Vitals reviewed.    Lab Results  Component Value Date   WBC 8.3 05/16/2014   HGB 14.1 05/16/2014   HCT 41.2 05/16/2014   PLT 218.0 05/16/2014   GLUCOSE 101* 05/16/2014   CHOL 172 05/16/2014  TRIG 231.0* 05/16/2014   HDL 41.10 05/16/2014   LDLDIRECT 106.0 05/16/2014   LDLCALC 90 06/29/2013   ALT 13 05/16/2014   AST 16 05/16/2014   NA  139 05/16/2014   K 4.8 05/16/2014   CL 103 05/16/2014   CREATININE 1.11 05/16/2014   BUN 16 05/16/2014   CO2 31 05/16/2014   TSH 1.40 05/16/2014       Assessment & Plan:

## 2014-05-17 NOTE — Assessment & Plan Note (Signed)
Her BP is well controlled Lytes and renal function are stable 

## 2014-05-17 NOTE — Assessment & Plan Note (Signed)
She has good rate and rhythm control 

## 2014-05-17 NOTE — Assessment & Plan Note (Signed)
The exam and plain films are WNL This appears to be a contusion She will try APAP for pain

## 2014-05-19 ENCOUNTER — Encounter: Payer: Self-pay | Admitting: Internal Medicine

## 2014-05-20 LAB — CULTURE, URINE COMPREHENSIVE: Colony Count: 25000

## 2014-06-06 ENCOUNTER — Ambulatory Visit (INDEPENDENT_AMBULATORY_CARE_PROVIDER_SITE_OTHER): Payer: Medicare Other | Admitting: Internal Medicine

## 2014-06-06 ENCOUNTER — Encounter: Payer: Self-pay | Admitting: Internal Medicine

## 2014-06-06 VITALS — BP 132/62 | HR 73 | Temp 97.7°F | Resp 16 | Ht 65.0 in | Wt 174.8 lb

## 2014-06-06 DIAGNOSIS — I5022 Chronic systolic (congestive) heart failure: Secondary | ICD-10-CM

## 2014-06-06 DIAGNOSIS — I10 Essential (primary) hypertension: Secondary | ICD-10-CM

## 2014-06-06 DIAGNOSIS — N3000 Acute cystitis without hematuria: Secondary | ICD-10-CM | POA: Diagnosis not present

## 2014-06-06 MED ORDER — SPIRONOLACTONE 25 MG PO TABS: 25.0000 mg | ORAL_TABLET | Freq: Every day | ORAL | Status: DC

## 2014-06-06 NOTE — Progress Notes (Signed)
Subjective:  Patient ID: Deborah Chung, female    DOB: 1926-05-30  Age: 81 y.o. MRN: [id number]  CC: Urinary Tract Infection; Hypertension; Congestive Heart Failure; and Atrial Fibrillation   HPI Deborah Chung presents for follow up after recent treatment for UTI - she has no dysuria or frequency and feels well today.  Outpatient Prescriptions Prior to Visit  Medication Sig Dispense Refill  . ALPRAZolam (XANAX) 0.25 MG tablet TAKE 1 TABLET BY MOUTH EVERY DAY AS NEEDED FOR ANXIETY 30 tablet 3  . apixaban (ELIQUIS) 2.5 MG TABS tablet Take 1 tablet (2.5 mg total) by mouth 2 (two) times daily. 180 tablet 3  . aspirin 81 MG tablet Take 81 mg by mouth daily.    . Biotin 10 MG TABS Take 1 tablet by mouth daily.    . digoxin (LANOXIN) 0.125 MG tablet TAKE 1 TABLET (0.125 MG TOTAL) BY MOUTH DAILY. 90 tablet 2  . DiphenhydrAMINE HCl, Sleep, 50 MG CAPS Take by mouth.    . metoprolol tartrate (LOPRESSOR) 25 MG tablet Take 1 tablet (25 mg total) by mouth 2 (two) times daily. 180 tablet 3  . pantoprazole (PROTONIX) 40 MG tablet Take 1 tablet (40 mg total) by mouth daily. 90 tablet 3  . pravastatin (PRAVACHOL) 20 MG tablet Take 1 tablet (20 mg total) by mouth daily. 90 tablet 3  . pregabalin (LYRICA) 75 MG capsule Take 1 capsule (75 mg total) by mouth 3 (three) times daily. 270 capsule 1  . ciprofloxacin (CIPRO) 250 MG tablet Take 1 tablet (250 mg total) by mouth 2 (two) times daily. 10 tablet 1  . spironolactone (ALDACTONE) 25 MG tablet Take 1 tablet (25 mg total) by mouth daily. 90 tablet 3   No facility-administered medications prior to visit.    ROS Review of Systems  Constitutional: Negative.  Negative for fever, chills, diaphoresis, appetite change and fatigue.  HENT: Negative.   Eyes: Negative.   Respiratory: Negative.  Negative for cough, choking, chest tightness, shortness of breath, wheezing and stridor.   Cardiovascular: Negative.  Negative for chest pain, palpitations and leg swelling.   Gastrointestinal: Negative.  Negative for nausea, vomiting, abdominal pain, diarrhea and constipation.  Endocrine: Negative.   Genitourinary: Negative.  Negative for dysuria, urgency, frequency, hematuria, flank pain, decreased urine volume and difficulty urinating.  Musculoskeletal: Negative.   Skin: Negative.   Allergic/Immunologic: Negative.   Neurological: Negative.  Negative for dizziness, tremors, syncope, light-headedness and numbness.  Hematological: Negative.  Negative for adenopathy. Does not bruise/bleed easily.  Psychiatric/Behavioral: Negative.     Objective:  BP 132/62 mmHg  Pulse 73  Temp(Src) 97.7 F (36.5 C) (Oral)  Resp 16  Ht 5\' 5"  (1.651 m)  Wt 174 lb 12.8 oz (79.289 kg)  BMI 29.09 kg/m2  SpO2 94%  BP Readings from Last 3 Encounters:  06/06/14 132/62  05/16/14 116/68  02/07/14 136/60    Wt Readings from Last 3 Encounters:  06/06/14 174 lb 12.8 oz (79.289 kg)  05/16/14 174 lb (78.926 kg)  02/07/14 171 lb 9.6 oz (77.837 kg)    Physical Exam  Constitutional: She is oriented to person, place, and time. She appears well-developed and well-nourished. No distress.  HENT:  Head: Normocephalic and atraumatic.  Mouth/Throat: Oropharynx is clear and moist. No oropharyngeal exudate.  Eyes: Conjunctivae are normal. Right eye exhibits no discharge. Left eye exhibits no discharge. No scleral icterus.  Neck: Normal range of motion. Neck supple. No JVD present. No tracheal deviation present. No thyromegaly present.  Cardiovascular: Normal rate, regular rhythm, normal heart sounds and intact distal pulses.  Exam reveals no gallop and no friction rub.   No murmur heard. Pulmonary/Chest: Effort normal and breath sounds normal. No stridor. No respiratory distress. She has no wheezes. She has no rales. She exhibits no tenderness.  Abdominal: Soft. Bowel sounds are normal. She exhibits no distension and no mass. There is no tenderness. There is no rebound and no guarding.    Musculoskeletal: Normal range of motion. She exhibits no edema or tenderness.  Lymphadenopathy:    She has no cervical adenopathy.  Neurological: She is oriented to person, place, and time.  Skin: Skin is warm and dry. No rash noted. She is not diaphoretic. No erythema. No pallor.  Psychiatric: She has a normal mood and affect. Her behavior is normal. Judgment and thought content normal.  Vitals reviewed.   Lab Results  Component Value Date   WBC 8.3 05/16/2014   HGB 14.1 05/16/2014   HCT 41.2 05/16/2014   PLT 218.0 05/16/2014   GLUCOSE 101* 05/16/2014   CHOL 172 05/16/2014   TRIG 231.0* 05/16/2014   HDL 41.10 05/16/2014   LDLDIRECT 106.0 05/16/2014   LDLCALC 90 06/29/2013   ALT 13 05/16/2014   AST 16 05/16/2014   NA 139 05/16/2014   K 4.8 05/16/2014   CL 103 05/16/2014   CREATININE 1.11 05/16/2014   BUN 16 05/16/2014   CO2 31 05/16/2014   TSH 1.40 05/16/2014    Dg Tibia/fibula Left  05/16/2014   CLINICAL DATA:  Pain bruising and swelling over the distal lower leg following injury 1 month ago  EXAM: LEFT TIBIA AND FIBULA - 2 VIEW  COMPARISON:  None.  FINDINGS: There is a prosthetic right hip joint. The visualized portions of the prosthesis are unremarkable. The left tibia and fibula are adequately mineralized. There is no acute or healing fracture. There is no lytic nor blastic lesion. The soft tissues exhibit no abnormal densities. Mild soft tissue swelling anteriorly is noted over the lower leg.  IMPRESSION: There is no acute bony abnormality of the left tibia or fibula. There is soft tissue swelling distally especially anteriorly.   Electronically Signed   By: David  [place] M.D.   On: 05/16/2014 15:02    Assessment & Plan:   Deborah Chung was seen today for urinary tract infection, hypertension, congestive heart failure and atrial fibrillation.  Diagnoses and all orders for this visit:  Acute cystitis without hematuria - will recheck her urine clx today to confirm  resolution Orders: -     CULTURE, URINE COMPREHENSIVE; Future  Essential hypertension, benign - her BP is well controlled Orders: -     spironolactone (ALDACTONE) 25 MG tablet; Take 1 tablet (25 mg total) by mouth daily.  Chronic systolic heart failure - she has a normal volume status today Orders: -     spironolactone (ALDACTONE) 25 MG tablet; Take 1 tablet (25 mg total) by mouth daily.   I have discontinued Ms. Carroll's ciprofloxacin. I am also having her maintain her aspirin, DiphenhydrAMINE HCl (Sleep), pravastatin, pantoprazole, metoprolol tartrate, apixaban, pregabalin, Biotin, ALPRAZolam, digoxin, and spironolactone.  Meds ordered this encounter  Medications  . spironolactone (ALDACTONE) 25 MG tablet    Sig: Take 1 tablet (25 mg total) by mouth daily.    Dispense:  90 tablet    Refill:  3     Follow-up: Return in about 6 months (around 12/07/2014).  Sanda Linger, MD

## 2014-06-06 NOTE — Patient Instructions (Signed)

## 2014-06-20 ENCOUNTER — Ambulatory Visit (INDEPENDENT_AMBULATORY_CARE_PROVIDER_SITE_OTHER): Payer: Medicare Other | Admitting: *Deleted

## 2014-06-20 ENCOUNTER — Encounter: Payer: Self-pay | Admitting: Internal Medicine

## 2014-06-20 DIAGNOSIS — I429 Cardiomyopathy, unspecified: Secondary | ICD-10-CM | POA: Diagnosis not present

## 2014-06-20 DIAGNOSIS — I428 Other cardiomyopathies: Secondary | ICD-10-CM

## 2014-06-20 NOTE — Progress Notes (Signed)
Remote pacemaker transmission.   

## 2014-06-23 LAB — CUP PACEART REMOTE DEVICE CHECK
Battery Remaining Longevity: 78 mo
Battery Remaining Percentage: 100 %
Brady Statistic RA Percent Paced: 99 %
Brady Statistic RV Percent Paced: 83 %
Date Time Interrogation Session: 20160606084100
Lead Channel Impedance Value: 522 Ohm
Lead Channel Impedance Value: 901 Ohm
Lead Channel Pacing Threshold Amplitude: 0.7 V
Lead Channel Pacing Threshold Amplitude: 1.1 V
Lead Channel Pacing Threshold Pulse Width: 0.4 ms
Lead Channel Pacing Threshold Pulse Width: 0.5 ms
Lead Channel Setting Pacing Amplitude: 1.2 V
Lead Channel Setting Pacing Amplitude: 2.4 V
Lead Channel Setting Pacing Pulse Width: 0.4 ms
Lead Channel Setting Sensing Sensitivity: 3 mV
Pulse Gen Serial Number: 391753
Zone Setting Detection Interval: 375 ms

## 2014-06-27 ENCOUNTER — Telehealth: Payer: Self-pay | Admitting: Internal Medicine

## 2014-06-27 MED ORDER — PANTOPRAZOLE SODIUM 40 MG PO TBEC: 40.0000 mg | DELAYED_RELEASE_TABLET | Freq: Every day | ORAL | Status: DC

## 2014-06-27 NOTE — Telephone Encounter (Signed)
Done

## 2014-06-27 NOTE — Telephone Encounter (Signed)
Pt called in and needs a refill on her pantoprazole (PROTONIX) 40 MG tablet [[id number]]  [facility]

## 2014-07-01 ENCOUNTER — Other Ambulatory Visit: Payer: Self-pay

## 2014-07-01 MED ORDER — PRAVASTATIN SODIUM 20 MG PO TABS: 20.0000 mg | ORAL_TABLET | Freq: Every day | ORAL | Status: DC

## 2014-07-04 ENCOUNTER — Encounter: Payer: Self-pay | Admitting: Cardiology

## 2014-07-11 ENCOUNTER — Other Ambulatory Visit: Payer: Self-pay | Admitting: Internal Medicine

## 2014-07-11 DIAGNOSIS — B0229 Other postherpetic nervous system involvement: Secondary | ICD-10-CM

## 2014-07-11 MED ORDER — PREGABALIN 75 MG PO CAPS: 75.0000 mg | ORAL_CAPSULE | Freq: Three times a day (TID) | ORAL | Status: DC

## 2014-07-11 MED ORDER — METOPROLOL TARTRATE 25 MG PO TABS: 25.0000 mg | ORAL_TABLET | Freq: Two times a day (BID) | ORAL | Status: DC

## 2014-07-11 MED ORDER — APIXABAN 2.5 MG PO TABS: 2.5000 mg | ORAL_TABLET | Freq: Two times a day (BID) | ORAL | Status: DC

## 2014-07-11 NOTE — Telephone Encounter (Signed)
Patient is needing prescription refills for apixaban (ELIQUIS) 2.5 MG TABS tablet [[id number], metoprolol tartrate (LOPRESSOR) 25 MG tablet [[id number]] and pregabalin (LYRICA) 75 MG capsule [[id number]. She is out of the Lyrica. [facility] [address]

## 2014-07-11 NOTE — Telephone Encounter (Signed)
Please advise if ok to refill Lyrica.

## 2014-08-08 ENCOUNTER — Encounter: Payer: Self-pay | Admitting: Internal Medicine

## 2014-08-08 ENCOUNTER — Ambulatory Visit (INDEPENDENT_AMBULATORY_CARE_PROVIDER_SITE_OTHER): Payer: Medicare Other | Admitting: Internal Medicine

## 2014-08-08 VITALS — BP 137/72 | HR 70 | Ht 65.0 in | Wt 172.7 lb

## 2014-08-08 DIAGNOSIS — I10 Essential (primary) hypertension: Secondary | ICD-10-CM

## 2014-08-08 DIAGNOSIS — I480 Paroxysmal atrial fibrillation: Secondary | ICD-10-CM

## 2014-08-08 DIAGNOSIS — Z950 Presence of cardiac pacemaker: Secondary | ICD-10-CM

## 2014-08-08 DIAGNOSIS — E785 Hyperlipidemia, unspecified: Secondary | ICD-10-CM | POA: Diagnosis not present

## 2014-08-08 NOTE — Progress Notes (Signed)
OFFICE NOTE  Chief Complaint:  Follow-up echo  Primary Care Physician: Sanda Linger, MD  HPI:  Deborah Chung is a pleasant 81 year old female who recently moved to [place] from [place]. She was apparently living in [place], somewhere outside of [place]. Her son is Yomna Haveman, who is the in-house counsel for the [facility] system. She was previously seeing [facility] and had an episode in February of 2015. Apparently she had a mobile health screening and was found to be out of rhythm. She was sent to her primary care doctor who did an EKG and noted she was in A. fib and was sent immediately to the hospital. As the story goes, she was on several treatments for her A. fib and apparently eventually underwent cardiac catheterization and/or possibly an ablation procedure for which she developed asystole or some type of arrhythmia requiring her to be resuscitated. Obviously this was successful and she received apparently a pacemaker. She is not clear as to what the type of pacemaker wasn't did not provide documentation today. I've not yet received any records from her cardiologist in [place]. She was placed on Eliquis for anticoagulation. In addition, it appears she is on digoxin and Aldactone along with Lopressor which is suggestive of possible cardiomyopathy. She apparently does have a heart failure diagnosis however I'm not certain as to what her ejection fraction is. She reports a remote check device for her pacemaker and did a remote check apparently last week which went to [place]. Symptomatically she denies any chest pain or shortness of breath.  I saw Deborah Chung back today in the office. She is doing fairly well. She saw Dr. Lewayne Bunting in November who performed a pacemaker check and felt that she was doing well in sinus rhythm. There is no evidence for recurrent atrial fibrillation. She has yet to be enrolled in home monitoring. Twice she has  requested a home monitor but it has not been yet sent to her. I will need to look into why she has not received her home monitoring device. 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 Eyes a chest pain or worsening shortness of breath. As mentioned recent laboratory work from her primary shows excellent cholesterol control with total cholesterol 174, triglycerides 124, HDL 59 and LDL of 90.  At the pleasure seeing Deborah Chung back in the office today. She reports doing fairly well. She still has some issues with swelling in her legs and discomfort with achiness and heaviness. She's had bilateral knee surgeries and is noted to have some varicose veins as well as some discoloration over her ankles. From a cardiac standpoint she had an echo which shows preserved systolic function, this represents a marked improvement since her EF was reported around 10% at one point but this may been after cardiac arrest when she got her pacemaker placed. Pacemaker function appears to be normal. She's had little if any atrial fibrillation and is on Eliquis for a CHADSVASC score of 4.  PMHx:  Past Medical History  Diagnosis Date  . Hypertension   . CHF (congestive heart failure)   . Hyperlipidemia   . PHN (postherpetic neuralgia) 2010  . GAD (generalized anxiety disorder)   . Systolic CHF   . PHN (postherpetic neuralgia)   . Hearing loss   . Dyslipidemia   . Essential hypertension, benign   . Atrial fibrillation     Past Surgical History  Procedure Laterality Date  . Abdominal hysterectomy    . Pacemaker  placement      FAMHx:  No family history on file.  SOCHx:   reports that she quit smoking about 26 years ago. She has never used smokeless tobacco. She reports that she does not drink alcohol or use illicit drugs.  ALLERGIES:  No Known Allergies  ROS: A comprehensive review of systems was negative except for: Cardiovascular: positive for lower extremity edema  HOME MEDS: Current Outpatient  Prescriptions  Medication Sig Dispense Refill  . ALPRAZolam (XANAX) 0.25 MG tablet TAKE 1 TABLET BY MOUTH EVERY DAY AS NEEDED FOR ANXIETY 30 tablet 3  . apixaban (ELIQUIS) 2.5 MG TABS tablet Take 1 tablet (2.5 mg total) by mouth 2 (two) times daily. 180 tablet 0  . aspirin 81 MG tablet Take 81 mg by mouth daily.    . Biotin 10 MG TABS Take 1 tablet by mouth daily.    . digoxin (LANOXIN) 0.125 MG tablet TAKE 1 TABLET (0.125 MG TOTAL) BY MOUTH DAILY. 90 tablet 2  . DiphenhydrAMINE HCl, Sleep, 50 MG CAPS Take by mouth.    . metoprolol tartrate (LOPRESSOR) 25 MG tablet Take 1 tablet (25 mg total) by mouth 2 (two) times daily. 180 tablet 0  . pantoprazole (PROTONIX) 40 MG tablet Take 1 tablet (40 mg total) by mouth daily. 90 tablet 3  . pravastatin (PRAVACHOL) 20 MG tablet Take 1 tablet (20 mg total) by mouth daily. 90 tablet 3  . pregabalin (LYRICA) 75 MG capsule Take 1 capsule (75 mg total) by mouth 3 (three) times daily. 270 capsule 1  . spironolactone (ALDACTONE) 25 MG tablet Take 1 tablet (25 mg total) by mouth daily. 90 tablet 3   No current facility-administered medications for this visit.    LABS/IMAGING: No results found for this or any previous visit (from the past 48 hour(s)). No results found.  VITALS: BP 137/72 mmHg  Pulse 70  Ht  (1.651 m)  Wt 172 lb 11.2 oz (78.336 kg)  BMI 28.74 kg/m2  EXAM: General appearance: alert and no distress Neck: no carotid bruit and no JVD Lungs: clear to auscultation bilaterally and pacemaker in well-healed pocket in the left upper chest Heart: regular rate and rhythm, S1, S2 normal, no murmur, click, rub or gallop Abdomen: soft, non-tender; bowel sounds normal; no masses,  no organomegaly Extremities: extremities normal, atraumatic, no cyanosis or edema, varicose veins noted and Shiny skin without pitting edema Pulses: 2+ and symmetric Skin: Skin color, texture, turgor normal. No rashes or lesions Neurologic: Grossly normal Psych:  NOrmal  EKG: Atrial paced rhythm at 70, anteroseptal infarct pattern  ASSESSMENT: 1. History of atrial fibrillation on anticoagulation with Eliquis (CHADVASC 4) 2. Hypertension 3. Dyslipidemia 4. Status post pacemaker - for sinus node dysfunction and complete heart block 5. Reported history of EF of 10-15%, now improved to 55-60%  PLAN: 1.  Deborah Chung seems to be doing very well. Her EF is normalized. Her pacemaker function is normal. She has very little if any recurrent atrial fibrillation. She is tolerating Eliquis without any bleeding problems. Blood pressure is well controlled. Cholesterol is at goal. Her pacemaker is followed by Dr. Lewayne Bunting. She does have some peripheral venous disease and may benefit from bilateral 20-30 mmHg knee-high compression stockings. I've advised her to elevate her feet as much as possible and avoid salt in her diet. Plan to see her back in 6 months.  Chrystie Nose, MD, [facility] Attending Cardiologist [facility]  Lisette Abu Hilty 08/08/2014, 1:11 PM

## 2014-08-08 NOTE — Patient Instructions (Signed)
Your physician wants you to follow-up in: 6 months with Dr. Hilty. You will receive a reminder letter in the mail two months in advance. If you don't receive a letter, please call our office to schedule the follow-up appointment.    

## 2014-08-15 DIAGNOSIS — L821 Other seborrheic keratosis: Secondary | ICD-10-CM | POA: Diagnosis not present

## 2014-08-15 DIAGNOSIS — L720 Epidermal cyst: Secondary | ICD-10-CM | POA: Diagnosis not present

## 2014-08-15 DIAGNOSIS — L814 Other melanin hyperpigmentation: Secondary | ICD-10-CM | POA: Diagnosis not present

## 2014-09-20 ENCOUNTER — Ambulatory Visit (INDEPENDENT_AMBULATORY_CARE_PROVIDER_SITE_OTHER): Payer: Medicare Other | Admitting: *Deleted

## 2014-09-20 DIAGNOSIS — I480 Paroxysmal atrial fibrillation: Secondary | ICD-10-CM | POA: Diagnosis not present

## 2014-09-23 NOTE — Progress Notes (Signed)
Remote pacemaker transmission.   

## 2014-10-04 DIAGNOSIS — H43813 Vitreous degeneration, bilateral: Secondary | ICD-10-CM | POA: Diagnosis not present

## 2014-10-04 DIAGNOSIS — H02102 Unspecified ectropion of right lower eyelid: Secondary | ICD-10-CM | POA: Diagnosis not present

## 2014-10-04 DIAGNOSIS — H2513 Age-related nuclear cataract, bilateral: Secondary | ICD-10-CM | POA: Diagnosis not present

## 2014-10-04 DIAGNOSIS — H25013 Cortical age-related cataract, bilateral: Secondary | ICD-10-CM | POA: Diagnosis not present

## 2014-10-05 ENCOUNTER — Other Ambulatory Visit: Payer: Self-pay

## 2014-10-05 MED ORDER — APIXABAN 2.5 MG PO TABS: 2.5000 mg | ORAL_TABLET | Freq: Two times a day (BID) | ORAL | Status: DC

## 2014-10-10 ENCOUNTER — Telehealth: Payer: Self-pay | Admitting: *Deleted

## 2014-10-10 LAB — CUP PACEART REMOTE DEVICE CHECK
Battery Remaining Longevity: 78 mo
Battery Remaining Percentage: 100 %
Brady Statistic RA Percent Paced: 99 %
Brady Statistic RV Percent Paced: 79 %
Date Time Interrogation Session: 20160906083600
Lead Channel Impedance Value: 519 Ohm
Lead Channel Impedance Value: 922 Ohm
Lead Channel Pacing Threshold Amplitude: 0.6 V
Lead Channel Pacing Threshold Amplitude: 1.1 V
Lead Channel Pacing Threshold Pulse Width: 0.4 ms
Lead Channel Pacing Threshold Pulse Width: 0.5 ms
Lead Channel Setting Pacing Amplitude: 1.1 V
Lead Channel Setting Pacing Amplitude: 2.4 V
Lead Channel Setting Pacing Pulse Width: 0.4 ms
Lead Channel Setting Sensing Sensitivity: 3 mV
Pulse Gen Serial Number: 391753
Zone Setting Detection Interval: 375 ms

## 2014-10-10 MED ORDER — CIPROFLOXACIN HCL 250 MG PO TABS: 250.0000 mg | ORAL_TABLET | Freq: Two times a day (BID) | ORAL | Status: AC

## 2014-10-10 NOTE — Telephone Encounter (Signed)
done

## 2014-10-10 NOTE — Telephone Encounter (Signed)
Notified pt md sent antibiotic to pharmacy.../lmb 

## 2014-10-10 NOTE — Telephone Encounter (Signed)
Left msg on triage stating she has a urinary tract infection wanting md to send antibiotic to pharmacy...Raechel Chute

## 2014-10-11 ENCOUNTER — Telehealth: Payer: Self-pay | Admitting: *Deleted

## 2014-10-11 MED ORDER — METOPROLOL TARTRATE 25 MG PO TABS: 25.0000 mg | ORAL_TABLET | Freq: Two times a day (BID) | ORAL | Status: DC

## 2014-10-11 NOTE — Telephone Encounter (Signed)
Received call pt states she need new rx for metoprolol. [facility].../[facility]

## 2014-10-20 DIAGNOSIS — Z23 Encounter for immunization: Secondary | ICD-10-CM | POA: Diagnosis not present

## 2014-10-21 ENCOUNTER — Encounter: Payer: Self-pay | Admitting: Cardiology

## 2014-11-09 ENCOUNTER — Telehealth: Payer: Self-pay

## 2014-11-09 MED ORDER — ALPRAZOLAM 0.25 MG PO TABS: ORAL_TABLET | ORAL | Status: DC

## 2014-11-09 NOTE — Telephone Encounter (Signed)
[facility] ([phone])

## 2014-11-09 NOTE — Telephone Encounter (Signed)
done

## 2014-11-16 ENCOUNTER — Ambulatory Visit (INDEPENDENT_AMBULATORY_CARE_PROVIDER_SITE_OTHER): Payer: Medicare Other | Admitting: Internal Medicine

## 2014-11-16 ENCOUNTER — Encounter: Payer: Self-pay | Admitting: Internal Medicine

## 2014-11-16 VITALS — BP 122/78 | HR 69 | Ht 65.0 in | Wt 175.6 lb

## 2014-11-16 DIAGNOSIS — Z950 Presence of cardiac pacemaker: Secondary | ICD-10-CM

## 2014-11-16 DIAGNOSIS — I10 Essential (primary) hypertension: Secondary | ICD-10-CM

## 2014-11-16 DIAGNOSIS — I480 Paroxysmal atrial fibrillation: Secondary | ICD-10-CM

## 2014-11-16 LAB — CUP PACEART INCLINIC DEVICE CHECK
Brady Statistic RA Percent Paced: 99 %
Brady Statistic RV Percent Paced: 77 %
Date Time Interrogation Session: 20161102040000
Implantable Lead Implant Date: 20150112
Implantable Lead Implant Date: 20150112
Implantable Lead Location: 753859
Implantable Lead Location: 753860
Implantable Lead Model: 4135
Implantable Lead Model: 4136
Implantable Lead Serial Number: 29308444
Implantable Lead Serial Number: 29569556
Lead Channel Impedance Value: 555 Ohm
Lead Channel Impedance Value: 958 Ohm
Lead Channel Pacing Threshold Amplitude: 0.3 V
Lead Channel Pacing Threshold Amplitude: 1.3 V
Lead Channel Pacing Threshold Pulse Width: 0.5 ms
Lead Channel Pacing Threshold Pulse Width: 0.5 ms
Lead Channel Sensing Intrinsic Amplitude: 18.3 mV
Lead Channel Setting Pacing Amplitude: 1.1 V
Lead Channel Setting Pacing Amplitude: 2.4 V
Lead Channel Setting Pacing Pulse Width: 0.4 ms
Lead Channel Setting Sensing Sensitivity: 3 mV
Pulse Gen Serial Number: 391753

## 2014-11-16 NOTE — Assessment & Plan Note (Signed)
Her blood pressure is well controlled. She will continue her current medications. 

## 2014-11-16 NOTE — Assessment & Plan Note (Signed)
Her [employer] dual-chamber pacemaker is working normally. We'll plan to recheck in several months. 

## 2014-11-16 NOTE — Progress Notes (Signed)
HPI Deborah Chung returns today for ongoing evaluation and management of her DDD PM. 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 She underwent insertion of a [employer] DDD PM in [place] several years ago. She has moved to [facility] to be closer to family. Her son Deborah Maduro is our hospital counsel. She developed shingles approx. 5 years ago and is bothered by residual neuralgia. She has a h/o atrial fibrillation and is anti-coagulated. She remains active and has very little if any limitation despite her advanced age. She denies chest pain.  No Known Allergies   Current Outpatient Prescriptions  Medication Sig Dispense Refill  . ALPRAZolam (XANAX) 0.25 MG tablet TAKE 1 TABLET BY MOUTH EVERY DAY AS NEEDED FOR ANXIETY 30 tablet 3  . apixaban (ELIQUIS) 2.5 MG TABS tablet Take 1 tablet (2.5 mg total) by mouth 2 (two) times daily. 180 tablet 0  . aspirin 81 MG tablet Take 81 mg by mouth daily.    . Biotin 10 MG TABS Take 1 tablet by mouth daily.    . digoxin (LANOXIN) 0.125 MG tablet TAKE 1 TABLET (0.125 MG TOTAL) BY MOUTH DAILY. 90 tablet 2  . DiphenhydrAMINE HCl, Sleep, 50 MG CAPS Take by mouth.    . metoprolol tartrate (LOPRESSOR) 25 MG tablet Take 1 tablet (25 mg total) by mouth 2 (two) times daily. 180 tablet 1  . pantoprazole (PROTONIX) 40 MG tablet Take 1 tablet (40 mg total) by mouth daily. 90 tablet 3  . pravastatin (PRAVACHOL) 20 MG tablet Take 1 tablet (20 mg total) by mouth daily. 90 tablet 3  . pregabalin (LYRICA) 75 MG capsule Take 1 capsule (75 mg total) by mouth 3 (three) times daily. 270 capsule 1  . spironolactone (ALDACTONE) 25 MG tablet Take 1 tablet (25 mg total) by mouth daily. 90 tablet 3   No current facility-administered medications for this visit.     Past Medical History  Diagnosis Date  . Hypertension   . CHF (congestive heart failure) (HCC)   . Hyperlipidemia   . PHN (postherpetic neuralgia) 2010  . GAD (generalized  anxiety disorder)   . Systolic CHF (HCC)   . PHN (postherpetic neuralgia)   . Hearing loss   . Dyslipidemia   . Essential hypertension, benign   . Atrial fibrillation (HCC)     ROS:   All systems reviewed and negative except as noted in the HPI.   Past Surgical History  Procedure Laterality Date  . Abdominal hysterectomy    . Pacemaker placement       No family history on file.   Social History   Social History  . Marital Status: Unknown    Spouse Name: N/A  . Number of Children: N/A  . Years of Education: N/A   Occupational History  . Not on file.   Social History Main Topics  . Smoking status: Former Smoker -- 25 years    Quit date: 08/03/1988  . Smokeless tobacco: Never Used  . Alcohol Use: No  . Drug Use: No  . Sexual Activity: Not Currently   Other Topics Concern  . Not on file   Social History Narrative     BP 122/78 mmHg  Pulse 69  Ht 5\' 5"  (1.651 m)  Wt 175 lb 9.6 oz (79.652 kg)  BMI 29.22 kg/m2  SpO2 94%  Physical Exam:  Well appearing elderly woman, NAD HEENT: Unremarkable Neck:  6 cm JVD, no thyromegally Back:  No CVA tenderness Lungs:  Clear with no wheezes HEART:  Regular rate rhythm, no murmurs, no rubs, no clicks Abd:  soft, positive bowel sounds, no organomegally, no rebound, no guarding Ext:  2 plus pulses, no edema, no cyanosis, no clubbing Skin:  No rashes no nodules Neuro:  CN II through XII intact, motor grossly intact   DEVICE  Normal device function.  See PaceArt for details.   Assess/Plan:

## 2014-11-16 NOTE — Patient Instructions (Signed)
Your physician recommends that you continue on your current medications as directed. Please refer to the Current Medication list given to you today.  Remote monitoring is used to monitor your Pacemaker of ICD from home. This monitoring reduces the number of office visits required to check your device to one time per year. It allows us to keep an eye on the functioning of your device to ensure it is working properly. You are scheduled for a device check from home on 02/15/2015. You may send your transmission at any time that day. If you have a wireless device, the transmission will be sent automatically. After your physician reviews your transmission, you will receive a postcard with your next transmission date.  Your physician wants you to follow-up in: 1 year with Dr. Taylor.  You will receive a reminder letter in the mail two months in advance. If you don't receive a letter, please call our office to schedule the follow-up appointment.  

## 2014-11-16 NOTE — Assessment & Plan Note (Signed)
She is maintaining sinus rhythm approximately 98% of the time. No change in her medications.

## 2014-12-27 ENCOUNTER — Encounter: Payer: Self-pay | Admitting: Internal Medicine

## 2014-12-31 ENCOUNTER — Other Ambulatory Visit: Payer: Self-pay | Admitting: Internal Medicine

## 2015-01-05 ENCOUNTER — Other Ambulatory Visit: Payer: Self-pay | Admitting: Internal Medicine

## 2015-01-06 NOTE — Telephone Encounter (Signed)
faxed

## 2015-01-16 ENCOUNTER — Other Ambulatory Visit: Payer: Self-pay | Admitting: Internal Medicine

## 2015-01-19 ENCOUNTER — Telehealth: Payer: Self-pay | Admitting: Internal Medicine

## 2015-01-19 MED ORDER — PROMETHAZINE HCL 12.5 MG PO TABS: 12.5000 mg | ORAL_TABLET | Freq: Four times a day (QID) | ORAL | Status: DC | PRN

## 2015-01-19 NOTE — Telephone Encounter (Signed)
Try phenergan 

## 2015-01-19 NOTE — Telephone Encounter (Signed)
Pt called in and said that she has been vomiting and diarrhea since Monday.  She wants to know if something could be called in for her for diarrhea?

## 2015-01-19 NOTE — Telephone Encounter (Signed)
Patient Name: Deborah Chung  DOB: [birth year]    Initial Comment Caller states mother vomiting, diarrhea, better yesterday, now having diarrhea again   Nurse Assessment  Nurse: Sherilyn Cooter, RN, Thurmond Butts Date/Time Lamount Cohen Time): 01/19/2015 10:53:48 AM  Confirm and document reason for call. If symptomatic, describe symptoms. ---Caller states that his mother had vomiting and diarrhea beginning Monday night. She last vomited Monday. She felt some better yesterday with no diarrhea. Diarrhea began again today x 1 and it was a lot. She last urinated a little bit this morning, but not much. Her mouth is dry, but she is sipping on Pedialyte. She states that her mouth is always dry anyway. She did not make it to the bathroom in time for the diarrhea, its just water coming out. She also had that problem on Monday night. Denies fever. Denies abdominal pain. No blood in the diarrhea. She does feel "real weak."  Has the patient traveled out of the country within the last 30 days? ---No  Does the patient have any new or worsening symptoms? ---Yes  Will a triage be completed? ---Yes  Related visit to physician within the last 2 weeks? ---No  Does the PT have any chronic conditions? (i.e. diabetes, asthma, etc.) ---Yes  List chronic conditions. ---Hypercholesterolemia, HTN, Reflux, Heart problems,  Is this a behavioral health or substance abuse call? ---No     Guidelines    Guideline Title Affirmed Question Affirmed Notes  Diarrhea [1] Drinking very little AND [2] dehydration suspected (e.g., no urine > 12 hours, very dry mouth, very lightheaded)    Final Disposition User   Go to ED Now (or PCP triage) Sherilyn Cooter, RN, Thurmond Butts    Comments  Mouth is dry all the time per patient, but she is very weak.  Both [facility] and [facility] is full for today. I discussed the [place] location with him. He states that his wife and daughter waited 5 hours there the other day. He is undecided where he will take her, but he will take her somewhere to  be checked for possible dehydration.   Referrals  REFERRED TO PCP OFFICE  GO TO FACILITY UNDECIDED   Disagree/Comply: Comply

## 2015-02-10 ENCOUNTER — Telehealth: Payer: Self-pay

## 2015-02-10 NOTE — Telephone Encounter (Signed)
[facility] (Key: [id number])

## 2015-02-11 NOTE — Telephone Encounter (Signed)
Approved. Pharmacy notified

## 2015-02-13 ENCOUNTER — Ambulatory Visit: Payer: Medicare Other | Admitting: Internal Medicine

## 2015-02-15 ENCOUNTER — Ambulatory Visit (INDEPENDENT_AMBULATORY_CARE_PROVIDER_SITE_OTHER): Payer: Medicare Other | Admitting: *Deleted

## 2015-02-15 ENCOUNTER — Ambulatory Visit (INDEPENDENT_AMBULATORY_CARE_PROVIDER_SITE_OTHER): Payer: Medicare Other | Admitting: Internal Medicine

## 2015-02-15 ENCOUNTER — Encounter: Payer: Self-pay | Admitting: Internal Medicine

## 2015-02-15 VITALS — BP 138/68 | HR 70 | Ht 65.0 in | Wt 177.0 lb

## 2015-02-15 DIAGNOSIS — I480 Paroxysmal atrial fibrillation: Secondary | ICD-10-CM

## 2015-02-15 DIAGNOSIS — Z79899 Other long term (current) drug therapy: Secondary | ICD-10-CM | POA: Diagnosis not present

## 2015-02-15 DIAGNOSIS — I10 Essential (primary) hypertension: Secondary | ICD-10-CM

## 2015-02-15 DIAGNOSIS — E785 Hyperlipidemia, unspecified: Secondary | ICD-10-CM | POA: Diagnosis not present

## 2015-02-15 DIAGNOSIS — Z950 Presence of cardiac pacemaker: Secondary | ICD-10-CM | POA: Diagnosis not present

## 2015-02-15 DIAGNOSIS — Z5181 Encounter for therapeutic drug level monitoring: Secondary | ICD-10-CM

## 2015-02-15 NOTE — Progress Notes (Signed)
Remote pacemaker transmission.   

## 2015-02-15 NOTE — Progress Notes (Signed)
OFFICE NOTE  Chief Complaint:  No complaints  Primary Care Physician: Sanda Linger, MD  HPI:  Deborah Chung is a pleasant 82 year old female who recently moved to [place] from [place]. She was apparently living in [place], somewhere outside of [place]. Her son is Bobbi Yount, who is the in-house counsel for the [facility] system. She was previously seeing [facility] and had an episode in February of 2015. Apparently she had a mobile health screening and was found to be out of rhythm. She was sent to her primary care doctor who did an EKG and noted she was in A. fib and was sent immediately to the hospital. As the story goes, she was on several treatments for her A. fib and apparently eventually underwent cardiac catheterization and/or possibly an ablation procedure for which she developed asystole or some type of arrhythmia requiring her to be resuscitated. Obviously this was successful and she received apparently a pacemaker. She is not clear as to what the type of pacemaker wasn't did not provide documentation today. I've not yet received any records from her cardiologist in [place]. She was placed on Eliquis for anticoagulation. In addition, it appears she is on digoxin and Aldactone along with Lopressor which is suggestive of possible cardiomyopathy. She apparently does have a heart failure diagnosis however I'm not certain as to what her ejection fraction is. She reports a remote check device for her pacemaker and did a remote check apparently last week which went to [place]. Symptomatically she denies any chest pain or shortness of breath.  I saw Deborah Chung back today in the office. She is doing fairly well. She saw Dr. Lewayne Bunting in November who performed a pacemaker check and felt that she was doing well in sinus rhythm. There is no evidence for recurrent atrial fibrillation. She has yet to be enrolled in home monitoring. Twice she has  requested a home monitor but it has not been yet sent to her. I will need to look into why she has not received her home monitoring device. 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 Eyes a chest pain or worsening shortness of breath. As mentioned recent laboratory work from her primary shows excellent cholesterol control with total cholesterol 174, triglycerides 124, HDL 59 and LDL of 90.  At the pleasure seeing Deborah Chung back in the office today. She reports doing fairly well. She still has some issues with swelling in her legs and discomfort with achiness and heaviness. She's had bilateral knee surgeries and is noted to have some varicose veins as well as some discoloration over her ankles. From a cardiac standpoint she had an echo which shows preserved systolic function, this represents a marked improvement since her EF was reported around 10% at one point but this may been after cardiac arrest when she got her pacemaker placed. Pacemaker function appears to be normal. She's had little if any atrial fibrillation and is on Eliquis for a CHADSVASC score of 4.  I saw Deborah Chung back today in the office. Overall she is feeling well. She denies any worsening shortness of breath or swelling. EF is now normalized. She follows with Dr. Ladona Ridgel for pacemaker. She's had no bleeding problems on Eliquis. She is on low-dose digoxin which will require monitoring. She is overdue for cholesterol check.  PMHx:  Past Medical History  Diagnosis Date  . Hypertension   . CHF (congestive heart failure) (HCC)   . Hyperlipidemia   . PHN (  postherpetic neuralgia) 2010  . GAD (generalized anxiety disorder)   . Systolic CHF (HCC)   . PHN (postherpetic neuralgia)   . Hearing loss   . Dyslipidemia   . Essential hypertension, benign   . Atrial fibrillation [facility])     Past Surgical History  Procedure Laterality Date  . Abdominal hysterectomy    . Pacemaker placement      FAMHx:  No family history on  file.  SOCHx:   reports that she quit smoking about 26 years ago. She has never used smokeless tobacco. She reports that she does not drink alcohol or use illicit drugs.  ALLERGIES:  No Known Allergies  ROS: A comprehensive review of systems was negative.  HOME MEDS: Current Outpatient Prescriptions  Medication Sig Dispense Refill  . ALPRAZolam (XANAX) 0.25 MG tablet TAKE 1 TABLET BY MOUTH EVERY DAY AS NEEDED FOR ANXIETY 30 tablet 3  . aspirin 81 MG tablet Take 81 mg by mouth daily.    . Biotin 10 MG TABS Take 1 tablet by mouth daily.    . digoxin (LANOXIN) 0.125 MG tablet TAKE 1 TABLET BY MOUTH EVERY DAY 90 tablet 2  . DiphenhydrAMINE HCl, Sleep, 50 MG CAPS Take by mouth.    . ELIQUIS 2.5 MG TABS tablet TAKE 1 TABLET (2.5 MG TOTAL) BY MOUTH 2 (TWO) TIMES DAILY. 180 tablet 1  . LYRICA 75 MG capsule TAKE 1 CAPSULE BY MOUTH 3 TIMES DAILY 270 capsule 1  . metoprolol tartrate (LOPRESSOR) 25 MG tablet Take 1 tablet (25 mg total) by mouth 2 (two) times daily. 180 tablet 1  . pantoprazole (PROTONIX) 40 MG tablet Take 1 tablet (40 mg total) by mouth daily. 90 tablet 3  . pravastatin (PRAVACHOL) 20 MG tablet Take 1 tablet (20 mg total) by mouth daily. 90 tablet 3  . promethazine (PHENERGAN) 12.5 MG tablet Take 1 tablet (12.5 mg total) by mouth every 6 (six) hours as needed for nausea or vomiting. 15 tablet 0  . spironolactone (ALDACTONE) 25 MG tablet Take 1 tablet (25 mg total) by mouth daily. 90 tablet 3   No current facility-administered medications for this visit.    LABS/IMAGING: No results found for this or any previous visit (from the past 48 hour(s)). No results found.  VITALS: BP 138/68 mmHg  Pulse 70  Ht 5\' 5"  (1.651 m)  Wt 177 lb (80.287 kg)  BMI 29.45 kg/m2  EXAM: General appearance: alert and no distress Neck: no carotid bruit and no JVD Lungs: clear to auscultation bilaterally and pacemaker in well-healed pocket in the left upper chest Heart: regular rate and rhythm,  S1, S2 normal, no murmur, click, rub or gallop Abdomen: soft, non-tender; bowel sounds normal; no masses,  no organomegaly Extremities: extremities normal, atraumatic, no cyanosis or edema, varicose veins noted and Shiny skin without pitting edema Pulses: 2+ and symmetric Skin: Skin color, texture, turgor normal. No rashes or lesions Neurologic: Grossly normal Psych: NOrmal  EKG: Atrial paced rhythm at 70  ASSESSMENT: 1. History of atrial fibrillation on anticoagulation with Eliquis (CHADVASC 4) 2. Hypertension 3. Dyslipidemia 4. Status post pacemaker - for sinus node dysfunction and complete heart block 5. Reported history of EF of 10-15%, now improved to 55-60%  PLAN: 1.  Deborah Chung seems to be doing very well. Her EF is normalized. Her pacemaker function is normal and followed by Dr. Ladona Ridgel. She has very little if any recurrent atrial fibrillation. She is tolerating Eliquis without any bleeding problems. Blood pressure is well  controlled. She is due for repeat lipid profile, comprehensive metabolic profile and digoxin level. At this point, since her LV function is normalized, I wonder if she still needs to be on digoxin. I'll discuss this with Dr. Ladona Ridgel. Otherwise no change in her medicine. Blood pressures at goal. Plan to see her back in 6 months.  Chrystie Nose, MD, [facility] Attending Cardiologist [facility]  Chrystie Nose 02/15/2015, 9:41 AM

## 2015-02-15 NOTE — Patient Instructions (Signed)
Your physician recommends that you return for lab work at your earliest convenience - FASTING  Dr Rennis Golden recommends that you schedule a follow-up appointment in 6 months. You will receive a reminder letter in the mail two months in advance. If you don't receive a letter, please call our office to schedule the follow-up appointment.  If you need a refill on your cardiac medications before your next appointment, please call your pharmacy.

## 2015-02-23 ENCOUNTER — Other Ambulatory Visit: Payer: Self-pay | Admitting: *Deleted

## 2015-02-23 LAB — COMPREHENSIVE METABOLIC PANEL
ALT: 16 U/L (ref 6–29)
AST: 18 U/L (ref 10–35)
Albumin: 4.1 g/dL (ref 3.6–5.1)
Alkaline Phosphatase: 66 U/L (ref 33–130)
BUN: 13 mg/dL (ref 7–25)
CO2: 30 mmol/L (ref 20–31)
Calcium: 9.5 mg/dL (ref 8.6–10.4)
Chloride: 103 mmol/L (ref 98–110)
Creat: 0.94 mg/dL — ABNORMAL HIGH (ref 0.60–0.88)
Glucose, Bld: 108 mg/dL — ABNORMAL HIGH (ref 65–99)
Potassium: 5 mmol/L (ref 3.5–5.3)
Sodium: 143 mmol/L (ref 135–146)
Total Bilirubin: 0.6 mg/dL (ref 0.2–1.2)
Total Protein: 6.8 g/dL (ref 6.1–8.1)

## 2015-02-23 LAB — LIPID PANEL
Cholesterol: 192 mg/dL (ref 125–200)
HDL: 41 mg/dL — ABNORMAL LOW (ref >=46)
LDL Cholesterol: 106 mg/dL (ref <130)
Total CHOL/HDL Ratio: 4.7 Ratio (ref <=5.0)
Triglycerides: 223 mg/dL — ABNORMAL HIGH (ref <150)
VLDL: 45 mg/dL — ABNORMAL HIGH (ref <30)

## 2015-02-23 LAB — DIGOXIN LEVEL: Digoxin Level: 1.5 ug/L (ref 0.8–2.0)

## 2015-03-18 LAB — CUP PACEART REMOTE DEVICE CHECK
Battery Remaining Longevity: 78 mo
Battery Remaining Percentage: 100 %
Brady Statistic RA Percent Paced: 98 %
Brady Statistic RV Percent Paced: 61 %
Date Time Interrogation Session: 20170201083700
Implantable Lead Implant Date: 20150112
Implantable Lead Implant Date: 20150112
Implantable Lead Location: 753859
Implantable Lead Location: 753860
Implantable Lead Model: 4135
Implantable Lead Model: 4136
Implantable Lead Serial Number: 29308444
Implantable Lead Serial Number: 29569556
Lead Channel Impedance Value: 547 Ohm
Lead Channel Impedance Value: 952 Ohm
Lead Channel Pacing Threshold Amplitude: 0.6 V
Lead Channel Pacing Threshold Amplitude: 1.3 V
Lead Channel Pacing Threshold Pulse Width: 0.4 ms
Lead Channel Pacing Threshold Pulse Width: 0.5 ms
Lead Channel Setting Pacing Amplitude: 1.1 V
Lead Channel Setting Pacing Amplitude: 2.4 V
Lead Channel Setting Pacing Pulse Width: 0.4 ms
Lead Channel Setting Sensing Sensitivity: 3 mV
Pulse Gen Serial Number: 391753

## 2015-03-18 NOTE — Progress Notes (Signed)
Normal remote reviewed. AF burden stable, on Eliquis  [facility] [place] 05/17/15

## 2015-03-29 ENCOUNTER — Encounter: Payer: Self-pay | Admitting: Cardiology

## 2015-04-02 ENCOUNTER — Other Ambulatory Visit: Payer: Self-pay | Admitting: Internal Medicine

## 2015-04-09 ENCOUNTER — Other Ambulatory Visit: Payer: Self-pay | Admitting: Internal Medicine

## 2015-05-17 ENCOUNTER — Ambulatory Visit (INDEPENDENT_AMBULATORY_CARE_PROVIDER_SITE_OTHER): Payer: Medicare Other | Admitting: *Deleted

## 2015-05-17 DIAGNOSIS — I480 Paroxysmal atrial fibrillation: Secondary | ICD-10-CM | POA: Diagnosis not present

## 2015-05-18 NOTE — Progress Notes (Signed)
Remote pacemaker transmission.   

## 2015-05-29 ENCOUNTER — Other Ambulatory Visit: Payer: Self-pay | Admitting: Internal Medicine

## 2015-05-29 ENCOUNTER — Telehealth: Payer: Self-pay

## 2015-05-29 NOTE — Telephone Encounter (Signed)
Call to [name] and LVM regarding AWV  Call to son at mobile number who stated his mother did have some questions and will let her know that I will be seeing her after Dr. Yetta Barre.

## 2015-05-30 ENCOUNTER — Encounter: Payer: Self-pay | Admitting: Internal Medicine

## 2015-05-30 ENCOUNTER — Ambulatory Visit (INDEPENDENT_AMBULATORY_CARE_PROVIDER_SITE_OTHER): Payer: Medicare Other | Admitting: Internal Medicine

## 2015-05-30 ENCOUNTER — Other Ambulatory Visit (INDEPENDENT_AMBULATORY_CARE_PROVIDER_SITE_OTHER): Payer: Medicare Other

## 2015-05-30 VITALS — BP 110/68 | HR 70 | Temp 97.8°F | Resp 16 | Ht 65.0 in | Wt 177.0 lb

## 2015-05-30 DIAGNOSIS — F411 Generalized anxiety disorder: Secondary | ICD-10-CM

## 2015-05-30 DIAGNOSIS — E781 Pure hyperglyceridemia: Secondary | ICD-10-CM | POA: Diagnosis not present

## 2015-05-30 DIAGNOSIS — I10 Essential (primary) hypertension: Secondary | ICD-10-CM

## 2015-05-30 DIAGNOSIS — J301 Allergic rhinitis due to pollen: Secondary | ICD-10-CM | POA: Insufficient documentation

## 2015-05-30 DIAGNOSIS — E785 Hyperlipidemia, unspecified: Secondary | ICD-10-CM

## 2015-05-30 DIAGNOSIS — Z0000 Encounter for general adult medical examination without abnormal findings: Secondary | ICD-10-CM | POA: Diagnosis not present

## 2015-05-30 DIAGNOSIS — R739 Hyperglycemia, unspecified: Secondary | ICD-10-CM | POA: Insufficient documentation

## 2015-05-30 DIAGNOSIS — K219 Gastro-esophageal reflux disease without esophagitis: Secondary | ICD-10-CM | POA: Diagnosis not present

## 2015-05-30 DIAGNOSIS — Z7189 Other specified counseling: Secondary | ICD-10-CM | POA: Insufficient documentation

## 2015-05-30 LAB — BASIC METABOLIC PANEL
BUN: 18 mg/dL (ref 6–23)
CO2: 32 mEq/L (ref 19–32)
Calcium: 9.5 mg/dL (ref 8.4–10.5)
Chloride: 104 mEq/L (ref 96–112)
Creatinine, Ser: 0.96 mg/dL (ref 0.40–1.20)
GFR: 58.43 mL/min — ABNORMAL LOW (ref >60.00)
Glucose, Bld: 100 mg/dL — ABNORMAL HIGH (ref 70–99)
Potassium: 4.9 mEq/L (ref 3.5–5.1)
Sodium: 140 mEq/L (ref 135–145)

## 2015-05-30 LAB — CBC WITH DIFFERENTIAL/PLATELET
Basophils Absolute: 0 10*3/uL (ref 0.0–0.1)
Basophils Relative: 0.6 % (ref 0.0–3.0)
Eosinophils Absolute: 0.3 10*3/uL (ref 0.0–0.7)
Eosinophils Relative: 4.3 % (ref 0.0–5.0)
HCT: 41.6 % (ref 36.0–46.0)
Hemoglobin: 14 g/dL (ref 12.0–15.0)
Lymphocytes Relative: 34.7 % (ref 12.0–46.0)
Lymphs Abs: 2.3 10*3/uL (ref 0.7–4.0)
MCHC: 33.7 g/dL (ref 30.0–36.0)
MCV: 94.6 fl (ref 78.0–100.0)
Monocytes Absolute: 0.7 10*3/uL (ref 0.1–1.0)
Monocytes Relative: 10.4 % (ref 3.0–12.0)
Neutro Abs: 3.3 10*3/uL (ref 1.4–7.7)
Neutrophils Relative %: 50 % (ref 43.0–77.0)
Platelets: 203 10*3/uL (ref 150.0–400.0)
RBC: 4.4 Mil/uL (ref 3.87–5.11)
RDW: 14.1 % (ref 11.5–15.5)
WBC: 6.6 10*3/uL (ref 4.0–10.5)

## 2015-05-30 LAB — TSH: TSH: 1.61 u[IU]/mL (ref 0.35–4.50)

## 2015-05-30 LAB — HEMOGLOBIN A1C: Hgb A1c MFr Bld: 5.6 % (ref 4.6–6.5)

## 2015-05-30 LAB — HM DEXA SCAN

## 2015-05-30 MED ORDER — RANITIDINE HCL 300 MG PO TABS: 300.0000 mg | ORAL_TABLET | Freq: Every day | ORAL | Status: DC

## 2015-05-30 MED ORDER — ALPRAZOLAM 0.25 MG PO TABS: ORAL_TABLET | ORAL | Status: DC

## 2015-05-30 MED ORDER — AZELASTINE HCL 0.1 % NA SOLN: 1.0000 | Freq: Two times a day (BID) | NASAL | Status: DC

## 2015-05-30 MED ORDER — CETIRIZINE HCL 10 MG PO CAPS: 1.0000 | ORAL_CAPSULE | Freq: Every day | ORAL | Status: DC

## 2015-05-30 NOTE — Assessment & Plan Note (Signed)

## 2015-05-30 NOTE — Patient Instructions (Signed)
Preventive Care for Adults, Female A healthy lifestyle and preventive care can promote health and wellness. Preventive health guidelines for women include the following key practices.  A routine yearly physical is a good way to check with your health care provider about your health and preventive screening. It is a chance to share any concerns and updates on your health and to receive a thorough exam.  Visit your dentist for a routine exam and preventive care every 6 months. Brush your teeth twice a day and floss once a day. Good oral hygiene prevents tooth decay and gum disease.  The frequency of eye exams is based on your age, health, family medical history, use of contact lenses, and other factors. Follow your health care provider's recommendations for frequency of eye exams.  Eat a healthy diet. Foods like vegetables, fruits, whole grains, low-fat dairy products, and lean protein foods contain the nutrients you need without too many calories. Decrease your intake of foods high in solid fats, added sugars, and salt. Eat the right amount of calories for you.Get information about a proper diet from your health care provider, if necessary.  Regular physical exercise is one of the most important things you can do for your health. Most adults should get at least 150 minutes of moderate-intensity exercise (any activity that increases your heart rate and causes you to sweat) each week. In addition, most adults need muscle-strengthening exercises on 2 or more days a week.  Maintain a healthy weight. The body mass index (BMI) is a screening tool to identify possible weight problems. It provides an estimate of body fat based on height and weight. Your health care provider can find your BMI and can help you achieve or maintain a healthy weight.For adults 20 years and older:  A BMI below 18.5 is considered underweight.  A BMI of 18.5 to 24.9 is normal.  A BMI of 25 to 29.9 is considered overweight.  A  BMI of 30 and above is considered obese.  Maintain normal blood lipids and cholesterol levels by exercising and minimizing your intake of saturated fat. Eat a balanced diet with plenty of fruit and vegetables. Blood tests for lipids and cholesterol should begin at age 45 and be repeated every 5 years. If your lipid or cholesterol levels are high, you are over 50, or you are at high risk for heart disease, you may need your cholesterol levels checked more frequently.Ongoing high lipid and cholesterol levels should be treated with medicines if diet and exercise are not working.  If you smoke, find out from your health care provider how to quit. If you do not use tobacco, do not start.  Lung cancer screening is recommended for adults aged 45-80 years who are at high risk for developing lung cancer because of a history of smoking. A yearly low-dose CT scan of the lungs is recommended for people who have at least a 30-pack-year history of smoking and are a current smoker or have quit within the past 15 years. A pack year of smoking is smoking an average of 1 pack of cigarettes a day for 1 year (for example: 1 pack a day for 30 years or 2 packs a day for 15 years). Yearly screening should continue until the smoker has stopped smoking for at least 15 years. Yearly screening should be stopped for people who develop a health problem that would prevent them from having lung cancer treatment.  If you are pregnant, do not drink alcohol. If you are  breastfeeding, be very cautious about drinking alcohol. If you are not pregnant and choose to drink alcohol, do not have more than 1 drink per day. One drink is considered to be 12 ounces (355 mL) of beer, 5 ounces (148 mL) of wine, or 1.5 ounces (44 mL) of liquor.  Avoid use of street drugs. Do not share needles with anyone. Ask for help if you need support or instructions about stopping the use of drugs.  High blood pressure causes heart disease and increases the risk  of stroke. Your blood pressure should be checked at least every 1 to 2 years. Ongoing high blood pressure should be treated with medicines if weight loss and exercise do not work.  If you are 55-79 years old, ask your health care provider if you should take aspirin to prevent strokes.  Diabetes screening is done by taking a blood sample to check your blood glucose level after you have not eaten for a certain period of time (fasting). If you are not overweight and you do not have risk factors for diabetes, you should be screened once every 3 years starting at age 45. If you are overweight or obese and you are 40-70 years of age, you should be screened for diabetes every year as part of your cardiovascular risk assessment.  Breast cancer screening is essential preventive care for women. You should practice "breast self-awareness." This means understanding the normal appearance and feel of your breasts and may include breast self-examination. Any changes detected, no matter how small, should be reported to a health care provider. Women in their 20s and 30s should have a clinical breast exam (CBE) by a health care provider as part of a regular health exam every 1 to 3 years. After age 40, women should have a CBE every year. Starting at age 40, women should consider having a mammogram (breast X-ray test) every year. Women who have a family history of breast cancer should talk to their health care provider about genetic screening. Women at a high risk of breast cancer should talk to their health care providers about having an MRI and a mammogram every year.  Breast cancer gene (BRCA)-related cancer risk assessment is recommended for women who have family members with BRCA-related cancers. BRCA-related cancers include breast, ovarian, tubal, and peritoneal cancers. Having family members with these cancers may be associated with an increased risk for harmful changes (mutations) in the breast cancer genes BRCA1 and  BRCA2. Results of the assessment will determine the need for genetic counseling and BRCA1 and BRCA2 testing.  Your health care provider may recommend that you be screened regularly for cancer of the pelvic organs (ovaries, uterus, and vagina). This screening involves a pelvic examination, including checking for microscopic changes to the surface of your cervix (Pap test). You may be encouraged to have this screening done every 3 years, beginning at age 21.  For women ages 30-65, health care providers may recommend pelvic exams and Pap testing every 3 years, or they may recommend the Pap and pelvic exam, combined with testing for human papilloma virus (HPV), every 5 years. Some types of HPV increase your risk of cervical cancer. Testing for HPV may also be done on women of any age with unclear Pap test results.  Other health care providers may not recommend any screening for nonpregnant women who are considered low risk for pelvic cancer and who do not have symptoms. Ask your health care provider if a screening pelvic exam is right for   you.  If you have had past treatment for cervical cancer or a condition that could lead to cancer, you need Pap tests and screening for cancer for at least 20 years after your treatment. If Pap tests have been discontinued, your risk factors (such as having a new sexual partner) need to be reassessed to determine if screening should resume. Some women have medical problems that increase the chance of getting cervical cancer. In these cases, your health care provider may recommend more frequent screening and Pap tests.  Colorectal cancer can be detected and often prevented. Most routine colorectal cancer screening begins at the age of 50 years and continues through age 75 years. However, your health care provider may recommend screening at an earlier age if you have risk factors for colon cancer. On a yearly basis, your health care provider may provide home test kits to check  for hidden blood in the stool. Use of a small camera at the end of a tube, to directly examine the colon (sigmoidoscopy or colonoscopy), can detect the earliest forms of colorectal cancer. Talk to your health care provider about this at age 50, when routine screening begins. Direct exam of the colon should be repeated every 5-10 years through age 75 years, unless early forms of precancerous polyps or small growths are found.  People who are at an increased risk for hepatitis B should be screened for this virus. You are considered at high risk for hepatitis B if:  You were born in a country where hepatitis B occurs often. Talk with your health care provider about which countries are considered high risk.  Your parents were born in a high-risk country and you have not received a shot to protect against hepatitis B (hepatitis B vaccine).  You have HIV or AIDS.  You use needles to inject street drugs.  You live with, or have sex with, someone who has hepatitis B.  You get hemodialysis treatment.  You take certain medicines for conditions like cancer, organ transplantation, and autoimmune conditions.  Hepatitis C blood testing is recommended for all people born from 1945 through 1965 and any individual with known risks for hepatitis C.  Practice safe sex. Use condoms and avoid high-risk sexual practices to reduce the spread of sexually transmitted infections (STIs). STIs include gonorrhea, chlamydia, syphilis, trichomonas, herpes, HPV, and human immunodeficiency virus (HIV). Herpes, HIV, and HPV are viral illnesses that have no cure. They can result in disability, cancer, and death.  You should be screened for sexually transmitted illnesses (STIs) including gonorrhea and chlamydia if:  You are sexually active and are younger than 24 years.  You are older than 24 years and your health care provider tells you that you are at risk for this type of infection.  Your sexual activity has changed  since you were last screened and you are at an increased risk for chlamydia or gonorrhea. Ask your health care provider if you are at risk.  If you are at risk of being infected with HIV, it is recommended that you take a prescription medicine daily to prevent HIV infection. This is called preexposure prophylaxis (PrEP). You are considered at risk if:  You are sexually active and do not regularly use condoms or know the HIV status of your partner(s).  You take drugs by injection.  You are sexually active with a partner who has HIV.  Talk with your health care provider about whether you are at high risk of being infected with HIV. If   you choose to begin PrEP, you should first be tested for HIV. You should then be tested every 3 months for as long as you are taking PrEP.  Osteoporosis is a disease in which the bones lose minerals and strength with aging. This can result in serious bone fractures or breaks. The risk of osteoporosis can be identified using a bone density scan. Women ages 67 years and over and women at risk for fractures or osteoporosis should discuss screening with their health care providers. Ask your health care provider whether you should take a calcium supplement or vitamin D to reduce the rate of osteoporosis.  Menopause can be associated with physical symptoms and risks. Hormone replacement therapy is available to decrease symptoms and risks. You should talk to your health care provider about whether hormone replacement therapy is right for you.  Use sunscreen. Apply sunscreen liberally and repeatedly throughout the day. You should seek shade when your shadow is shorter than you. Protect yourself by wearing long sleeves, pants, a wide-brimmed hat, and sunglasses year round, whenever you are outdoors.  Once a month, do a whole body skin exam, using a mirror to look at the skin on your back. Tell your health care provider of new moles, moles that have irregular borders, moles that  are larger than a pencil eraser, or moles that have changed in shape or color.  Stay current with required vaccines (immunizations).  Influenza vaccine. All adults should be immunized every year.  Tetanus, diphtheria, and acellular pertussis (Td, Tdap) vaccine. Pregnant women should receive 1 dose of Tdap vaccine during each pregnancy. The dose should be obtained regardless of the length of time since the last dose. Immunization is preferred during the 27th-36th week of gestation. An adult who has not previously received Tdap or who does not know her vaccine status should receive 1 dose of Tdap. This initial dose should be followed by tetanus and diphtheria toxoids (Td) booster doses every 10 years. Adults with an unknown or incomplete history of completing a 3-dose immunization series with Td-containing vaccines should begin or complete a primary immunization series including a Tdap dose. Adults should receive a Td booster every 10 years.  Varicella vaccine. An adult without evidence of immunity to varicella should receive 2 doses or a second dose if she has previously received 1 dose. Pregnant females who do not have evidence of immunity should receive the first dose after pregnancy. This first dose should be obtained before leaving the health care facility. The second dose should be obtained 4-8 weeks after the first dose.  Human papillomavirus (HPV) vaccine. Females aged 13-26 years who have not received the vaccine previously should obtain the 3-dose series. The vaccine is not recommended for use in pregnant females. However, pregnancy testing is not needed before receiving a dose. If a female is found to be pregnant after receiving a dose, no treatment is needed. In that case, the remaining doses should be delayed until after the pregnancy. Immunization is recommended for any person with an immunocompromised condition through the age of 61 years if she did not get any or all doses earlier. During the  3-dose series, the second dose should be obtained 4-8 weeks after the first dose. The third dose should be obtained 24 weeks after the first dose and 16 weeks after the second dose.  Zoster vaccine. One dose is recommended for adults aged 30 years or older unless certain conditions are present.  Measles, mumps, and rubella (MMR) vaccine. Adults born  before 1957 generally are considered immune to measles and mumps. Adults born in 1957 or later should have 1 or more doses of MMR vaccine unless there is a contraindication to the vaccine or there is laboratory evidence of immunity to each of the three diseases. A routine second dose of MMR vaccine should be obtained at least 28 days after the first dose for students attending postsecondary schools, health care workers, or international travelers. People who received inactivated measles vaccine or an unknown type of measles vaccine during 1963-1967 should receive 2 doses of MMR vaccine. People who received inactivated mumps vaccine or an unknown type of mumps vaccine before 1979 and are at high risk for mumps infection should consider immunization with 2 doses of MMR vaccine. For females of childbearing age, rubella immunity should be determined. If there is no evidence of immunity, females who are not pregnant should be vaccinated. If there is no evidence of immunity, females who are pregnant should delay immunization until after pregnancy. Unvaccinated health care workers born before 1957 who lack laboratory evidence of measles, mumps, or rubella immunity or laboratory confirmation of disease should consider measles and mumps immunization with 2 doses of MMR vaccine or rubella immunization with 1 dose of MMR vaccine.  Pneumococcal 13-valent conjugate (PCV13) vaccine. When indicated, a person who is uncertain of his immunization history and has no record of immunization should receive the PCV13 vaccine. All adults 65 years of age and older should receive this  vaccine. An adult aged 19 years or older who has certain medical conditions and has not been previously immunized should receive 1 dose of PCV13 vaccine. This PCV13 should be followed with a dose of pneumococcal polysaccharide (PPSV23) vaccine. Adults who are at high risk for pneumococcal disease should obtain the PPSV23 vaccine at least 8 weeks after the dose of PCV13 vaccine. Adults older than 80 years of age who have normal immune system function should obtain the PPSV23 vaccine dose at least 1 year after the dose of PCV13 vaccine.  Pneumococcal polysaccharide (PPSV23) vaccine. When PCV13 is also indicated, PCV13 should be obtained first. All adults aged 65 years and older should be immunized. An adult younger than age 65 years who has certain medical conditions should be immunized. Any person who resides in a nursing home or long-term care facility should be immunized. An adult smoker should be immunized. People with an immunocompromised condition and certain other conditions should receive both PCV13 and PPSV23 vaccines. People with human immunodeficiency virus (HIV) infection should be immunized as soon as possible after diagnosis. Immunization during chemotherapy or radiation therapy should be avoided. Routine use of PPSV23 vaccine is not recommended for American Indians, [place] Natives, or people younger than 65 years unless there are medical conditions that require PPSV23 vaccine. When indicated, people who have unknown immunization and have no record of immunization should receive PPSV23 vaccine. One-time revaccination 5 years after the first dose of PPSV23 is recommended for people aged 19-64 years who have chronic kidney failure, nephrotic syndrome, asplenia, or immunocompromised conditions. People who received 1-2 doses of PPSV23 before age 65 years should receive another dose of PPSV23 vaccine at age 65 years or later if at least 5 years have passed since the previous dose. Doses of PPSV23 are not  needed for people immunized with PPSV23 at or after age 65 years.  Meningococcal vaccine. Adults with asplenia or persistent complement component deficiencies should receive 2 doses of quadrivalent meningococcal conjugate (MenACWY-D) vaccine. The doses should be obtained   at least 2 months apart. Microbiologists working with certain meningococcal bacteria, [place] recruits, people at risk during an outbreak, and people who travel to or live in countries with a high rate of meningitis should be immunized. A first-year college student up through age 34 years who is living in a residence hall should receive a dose if she did not receive a dose on or after her 16th birthday. Adults who have certain high-risk conditions should receive one or more doses of vaccine.  Hepatitis A vaccine. Adults who wish to be protected from this disease, have certain high-risk conditions, work with hepatitis A-infected animals, work in hepatitis A research labs, or travel to or work in countries with a high rate of hepatitis A should be immunized. Adults who were previously unvaccinated and who anticipate close contact with an international adoptee during the first 60 days after arrival in the [place] from a country with a high rate of hepatitis A should be immunized.  Hepatitis B vaccine. Adults who wish to be protected from this disease, have certain high-risk conditions, may be exposed to blood or other infectious body fluids, are household contacts or sex partners of hepatitis B positive people, are clients or workers in certain care facilities, or travel to or work in countries with a high rate of hepatitis B should be immunized.  Haemophilus influenzae type b (Hib) vaccine. A previously unvaccinated person with asplenia or sickle cell disease or having a scheduled splenectomy should receive 1 dose of Hib vaccine. Regardless of previous immunization, a recipient of a hematopoietic stem cell transplant should receive a  3-dose series 6-12 months after her successful transplant. Hib vaccine is not recommended for adults with HIV infection. Preventive Services / Frequency Ages 35 to 4 years  Blood pressure check.** / Every 3-5 years.  Lipid and cholesterol check.** / Every 5 years beginning at age 60.  Clinical breast exam.** / Every 3 years for women in their 71s and 10s.  BRCA-related cancer risk assessment.** / For women who have family members with a BRCA-related cancer (breast, ovarian, tubal, or peritoneal cancers).  Pap test.** / Every 2 years from ages 76 through 26. Every 3 years starting at age 61 through age 76 or 93 with a history of 3 consecutive normal Pap tests.  HPV screening.** / Every 3 years from ages 37 through ages 60 to 51 with a history of 3 consecutive normal Pap tests.  Hepatitis C blood test.** / For any individual with known risks for hepatitis C.  Skin self-exam. / Monthly.  Influenza vaccine. / Every year.  Tetanus, diphtheria, and acellular pertussis (Tdap, Td) vaccine.** / Consult your health care provider. Pregnant women should receive 1 dose of Tdap vaccine during each pregnancy. 1 dose of Td every 10 years.  Varicella vaccine.** / Consult your health care provider. Pregnant females who do not have evidence of immunity should receive the first dose after pregnancy.  HPV vaccine. / 3 doses over 6 months, if 93 and younger. The vaccine is not recommended for use in pregnant females. However, pregnancy testing is not needed before receiving a dose.  Measles, mumps, rubella (MMR) vaccine.** / You need at least 1 dose of MMR if you were born in 1957 or later. You may also need a 2nd dose. For females of childbearing age, rubella immunity should be determined. If there is no evidence of immunity, females who are not pregnant should be vaccinated. If there is no evidence of immunity, females who are  pregnant should delay immunization until after pregnancy.  Pneumococcal  13-valent conjugate (PCV13) vaccine.** / Consult your health care provider.  Pneumococcal polysaccharide (PPSV23) vaccine.** / 1 to 2 doses if you smoke cigarettes or if you have certain conditions.  Meningococcal vaccine.** / 1 dose if you are age 68 to 8 years and a Market researcher living in a residence hall, or have one of several medical conditions, you need to get vaccinated against meningococcal disease. You may also need additional booster doses.  Hepatitis A vaccine.** / Consult your health care provider.  Hepatitis B vaccine.** / Consult your health care provider.  Haemophilus influenzae type b (Hib) vaccine.** / Consult your health care provider. Ages 7 to 53 years  Blood pressure check.** / Every year.  Lipid and cholesterol check.** / Every 5 years beginning at age 25 years.  Lung cancer screening. / Every year if you are aged 11-80 years and have a 30-pack-year history of smoking and currently smoke or have quit within the past 15 years. Yearly screening is stopped once you have quit smoking for at least 15 years or develop a health problem that would prevent you from having lung cancer treatment.  Clinical breast exam.** / Every year after age 48 years.  BRCA-related cancer risk assessment.** / For women who have family members with a BRCA-related cancer (breast, ovarian, tubal, or peritoneal cancers).  Mammogram.** / Every year beginning at age 41 years and continuing for as long as you are in good health. Consult with your health care provider.  Pap test.** / Every 3 years starting at age 65 years through age 37 or 70 years with a history of 3 consecutive normal Pap tests.  HPV screening.** / Every 3 years from ages 72 years through ages 60 to 40 years with a history of 3 consecutive normal Pap tests.  Fecal occult blood test (FOBT) of stool. / Every year beginning at age 21 years and continuing until age 5 years. You may not need to do this test if you get  a colonoscopy every 10 years.  Flexible sigmoidoscopy or colonoscopy.** / Every 5 years for a flexible sigmoidoscopy or every 10 years for a colonoscopy beginning at age 35 years and continuing until age 48 years.  Hepatitis C blood test.** / For all people born from [birth year] through 1965 and any individual with known risks for hepatitis C.  Skin self-exam. / Monthly.  Influenza vaccine. / Every year.  Tetanus, diphtheria, and acellular pertussis (Tdap/Td) vaccine.** / Consult your health care provider. Pregnant women should receive 1 dose of Tdap vaccine during each pregnancy. 1 dose of Td every 10 years.  Varicella vaccine.** / Consult your health care provider. Pregnant females who do not have evidence of immunity should receive the first dose after pregnancy.  Zoster vaccine.** / 1 dose for adults aged 30 years or older.  Measles, mumps, rubella (MMR) vaccine.** / You need at least 1 dose of MMR if you were born in 1957 or later. You may also need a second dose. For females of childbearing age, rubella immunity should be determined. If there is no evidence of immunity, females who are not pregnant should be vaccinated. If there is no evidence of immunity, females who are pregnant should delay immunization until after pregnancy.  Pneumococcal 13-valent conjugate (PCV13) vaccine.** / Consult your health care provider.  Pneumococcal polysaccharide (PPSV23) vaccine.** / 1 to 2 doses if you smoke cigarettes or if you have certain conditions.  Meningococcal vaccine.** /  Consult your health care provider.  Hepatitis A vaccine.** / Consult your health care provider.  Hepatitis B vaccine.** / Consult your health care provider.  Haemophilus influenzae type b (Hib) vaccine.** / Consult your health care provider. Ages 64 years and over  Blood pressure check.** / Every year.  Lipid and cholesterol check.** / Every 5 years beginning at age 23 years.  Lung cancer screening. / Every year if you  are aged 16-80 years and have a 30-pack-year history of smoking and currently smoke or have quit within the past 15 years. Yearly screening is stopped once you have quit smoking for at least 15 years or develop a health problem that would prevent you from having lung cancer treatment.  Clinical breast exam.** / Every year after age 74 years.  BRCA-related cancer risk assessment.** / For women who have family members with a BRCA-related cancer (breast, ovarian, tubal, or peritoneal cancers).  Mammogram.** / Every year beginning at age 44 years and continuing for as long as you are in good health. Consult with your health care provider.  Pap test.** / Every 3 years starting at age 58 years through age 22 or 39 years with 3 consecutive normal Pap tests. Testing can be stopped between 65 and 70 years with 3 consecutive normal Pap tests and no abnormal Pap or HPV tests in the past 10 years.  HPV screening.** / Every 3 years from ages 64 years through ages 70 or 61 years with a history of 3 consecutive normal Pap tests. Testing can be stopped between 65 and 70 years with 3 consecutive normal Pap tests and no abnormal Pap or HPV tests in the past 10 years.  Fecal occult blood test (FOBT) of stool. / Every year beginning at age 40 years and continuing until age 27 years. You may not need to do this test if you get a colonoscopy every 10 years.  Flexible sigmoidoscopy or colonoscopy.** / Every 5 years for a flexible sigmoidoscopy or every 10 years for a colonoscopy beginning at age 7 years and continuing until age 32 years.  Hepatitis C blood test.** / For all people born from [birth year] through 1963 and any individual with known risks for hepatitis C.  Osteoporosis screening.** / A one-time screening for women ages 30 years and over and women at risk for fractures or osteoporosis.  Skin self-exam. / Monthly.  Influenza vaccine. / Every year.  Tetanus, diphtheria, and acellular pertussis (Tdap/Td)  vaccine.** / 1 dose of Td every 10 years.  Varicella vaccine.** / Consult your health care provider.  Zoster vaccine.** / 1 dose for adults aged 35 years or older.  Pneumococcal 13-valent conjugate (PCV13) vaccine.** / Consult your health care provider.  Pneumococcal polysaccharide (PPSV23) vaccine.** / 1 dose for all adults aged 46 years and older.  Meningococcal vaccine.** / Consult your health care provider.  Hepatitis A vaccine.** / Consult your health care provider.  Hepatitis B vaccine.** / Consult your health care provider.  Haemophilus influenzae type b (Hib) vaccine.** / Consult your health care provider. ** Family history and personal history of risk and conditions may change your health care provider's recommendations.   This information is not intended to replace advice given to you by your health care provider. Make sure you discuss any questions you have with your health care provider.   Document Released: 02/26/2001 Document Revised: 01/21/2014 Document Reviewed: 05/28/2010 Elsevier Interactive Patient Education [employer].

## 2015-05-30 NOTE — Progress Notes (Signed)
Subjective:  Patient ID: Deborah Chung, female    DOB: 02-03-1926  Age: 82 y.o. MRN: [id number]  CC: Annual Exam; Hyperlipidemia; Hypertension; and Gastroesophageal Reflux   HPI Deborah Chung presents for a CPX and F/up.  Her acid reflux has been well controlled however she is concerned about some of the side effects from proton pump inhibitor so she wants to change to Zantac. She has had a few episodes of heartburn but denies chest pain, odynophagia, or dysphagia.  She complains of runny nose, nasal congestion, and sneezing. She is also become concerned about the side effects of Benadryl and once an alternative to treat her nasal allergies.  She tells me her blood pressures been well controlled and she has had no episodes of edema, weight gain, chest pain, blurred vision, shortness of breath, palpitations, or syncope.  Outpatient Prescriptions Prior to Visit  Medication Sig Dispense Refill  . aspirin 81 MG tablet Take 81 mg by mouth daily.    . Biotin 10 MG TABS Take 1 tablet by mouth daily.    Marland Kitchen ELIQUIS 2.5 MG TABS tablet TAKE 1 TABLET (2.5 MG TOTAL) BY MOUTH 2 (TWO) TIMES DAILY. 180 tablet 1  . LYRICA 75 MG capsule TAKE 1 CAPSULE BY MOUTH 3 TIMES DAILY 270 capsule 1  . metoprolol tartrate (LOPRESSOR) 25 MG tablet TAKE 1 TABLET BY MOUTH TWICE A DAY 180 tablet 1  . metoprolol tartrate (LOPRESSOR) 25 MG tablet Take 1 tablet (25 mg total) by mouth 2 (two) times daily. Yearly physical due in May must see md for refills 180 tablet 0  . pravastatin (PRAVACHOL) 20 MG tablet Take 1 tablet (20 mg total) by mouth daily. 90 tablet 3  . spironolactone (ALDACTONE) 25 MG tablet Take 1 tablet (25 mg total) by mouth once. Yearly physical is due must see MD for refills 30 tablet 0  . ALPRAZolam (XANAX) 0.25 MG tablet TAKE 1 TABLET BY MOUTH EVERY DAY AS NEEDED FOR ANXIETY 30 tablet 3  . DiphenhydrAMINE HCl, Sleep, 50 MG CAPS Take by mouth.    . pantoprazole (PROTONIX) 40 MG tablet Take 1 tablet (40 mg  total) by mouth daily. 90 tablet 3  . promethazine (PHENERGAN) 12.5 MG tablet Take 1 tablet (12.5 mg total) by mouth every 6 (six) hours as needed for nausea or vomiting. 15 tablet 0   No facility-administered medications prior to visit.    ROS Review of Systems  Constitutional: Negative.   HENT: Positive for congestion, postnasal drip, rhinorrhea and sneezing. Negative for facial swelling, hearing loss, sinus pressure, sore throat, tinnitus and trouble swallowing.   Eyes: Negative.   Respiratory: Negative.   Cardiovascular: Negative.  Negative for chest pain, palpitations and leg swelling.  Gastrointestinal: Negative.  Negative for nausea, vomiting, abdominal pain, diarrhea and constipation.  Endocrine: Negative.   Genitourinary: Negative.  Negative for difficulty urinating.  Musculoskeletal: Negative.  Negative for myalgias, back pain, arthralgias and neck pain.  Skin: Negative.  Negative for color change and rash.  Allergic/Immunologic: Negative.   Neurological: Negative.  Negative for dizziness, syncope, weakness and light-headedness.  Hematological: Negative.  Negative for adenopathy. Does not bruise/bleed easily.  Psychiatric/Behavioral: Positive for sleep disturbance. Negative for suicidal ideas, hallucinations, behavioral problems, confusion, self-injury, dysphoric mood and agitation. The patient is nervous/anxious.     Objective:  BP 110/68 mmHg  Pulse 70  Temp(Src) 97.8 F (36.6 C) (Oral)  Resp 16  Ht 5\' 5"  (1.651 m)  Wt 177 lb (80.287 kg)  BMI 29.45  kg/m2  SpO2 95%  BP Readings from Last 3 Encounters:  05/30/15 110/68  02/15/15 138/68  11/16/14 122/78    Wt Readings from Last 3 Encounters:  05/30/15 177 lb (80.287 kg)  02/15/15 177 lb (80.287 kg)  11/16/14 175 lb 9.6 oz (79.652 kg)    Physical Exam  Constitutional: She is oriented to person, place, and time. No distress.  HENT:  Mouth/Throat: Oropharynx is clear and moist. No oropharyngeal exudate.  Eyes:  Conjunctivae are normal. Right eye exhibits no discharge. Left eye exhibits no discharge. No scleral icterus.  Neck: Normal range of motion. Neck supple. No JVD present. No tracheal deviation present. No thyromegaly present.  Cardiovascular: Normal rate, regular rhythm, normal heart sounds and intact distal pulses.  Exam reveals no gallop and no friction rub.   No murmur heard. Pulmonary/Chest: Effort normal and breath sounds normal. No stridor. No respiratory distress. She has no wheezes. She has no rales. She exhibits no tenderness.  Abdominal: Soft. Bowel sounds are normal. She exhibits no distension and no mass. There is no tenderness. There is no rebound and no guarding.  Genitourinary:  Genitourinary, rectal, and breast exams were all deferred at her request.  Musculoskeletal: Normal range of motion. She exhibits no edema or tenderness.  Lymphadenopathy:    She has no cervical adenopathy.  Neurological: She is oriented to person, place, and time.  Skin: Skin is warm and dry. No rash noted. She is not diaphoretic. No erythema. No pallor.  Psychiatric: She has a normal mood and affect. Her behavior is normal. Judgment and thought content normal.  Vitals reviewed.   Lab Results  Component Value Date   WBC 6.6 05/30/2015   HGB 14.0 05/30/2015   HCT 41.6 05/30/2015   PLT 203.0 05/30/2015   GLUCOSE 100* 05/30/2015   CHOL 192 02/22/2015   TRIG 223* 02/22/2015   HDL 41* 02/22/2015   LDLDIRECT 106.0 05/16/2014   LDLCALC 106 02/22/2015   ALT 16 02/22/2015   AST 18 02/22/2015   NA 140 05/30/2015   K 4.9 05/30/2015   CL 104 05/30/2015   CREATININE 0.96 05/30/2015   BUN 18 05/30/2015   CO2 32 05/30/2015   TSH 1.61 05/30/2015   HGBA1C 5.6 05/30/2015    Dg Tibia/fibula Left  05/16/2014  CLINICAL DATA:  Pain bruising and swelling over the distal lower leg following injury 1 month ago EXAM: LEFT TIBIA AND FIBULA - 2 VIEW COMPARISON:  None. 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 The visualized portions of the prosthesis are unremarkable. The left tibia and fibula are adequately mineralized. There is no acute or healing fracture. There is no lytic nor blastic lesion. The soft tissues exhibit no abnormal densities. Mild soft tissue swelling anteriorly is noted over the lower leg. IMPRESSION: There is no acute bony abnormality of the left tibia or fibula. There is soft tissue swelling distally especially anteriorly. Electronically Signed   By: David  [place] M.D.   On: 05/16/2014 15:02    Assessment & Plan:   Deborah Chung was seen today for annual exam, hyperlipidemia, hypertension and gastroesophageal reflux.  Diagnoses and all orders for this visit:  Essential hypertension, benign- her blood pressure is well controlled, electrolytes and renal function are stable. -     Basic metabolic panel; Future  Hypertriglyceridemia- she is working on her lifestyle modifications.  Hyperlipidemia with target LDL less than 100- he is achieved her LDL goal is doing well on the statin. -  TSH; Future  GAD (generalized anxiety disorder) -     ALPRAZolam (XANAX) 0.25 MG tablet; TAKE 1 TABLET BY MOUTH EVERY DAY AS NEEDED FOR ANXIETY  Allergic rhinitis due to pollen- she will discontinue Benadryl and will control her symptoms with Zyrtec and Astelin nasal spray. -     Cetirizine HCl 10 MG CAPS; Take 1 capsule (10 mg total) by mouth daily. -     azelastine (ASTELIN) 0.1 % nasal spray; Place 1 spray into both nostrils 2 (two) times daily. Use in each nostril as directed  Gastroesophageal reflux disease without esophagitis- she has no atypical symptoms, will discontinue the proton pump inhibitor and change to Zantac -     ranitidine (ZANTAC) 300 MG tablet; Take 1 tablet (300 mg total) by mouth at bedtime. -     CBC with Differential/Platelet; Future  Hyperglycemia- improvement noted -     Basic metabolic panel; Future -     Hemoglobin A1c; Future  Routine general medical  examination at a health care facility   I have discontinued Ms. Thivierge's DiphenhydrAMINE HCl (Sleep), pantoprazole, and promethazine. I am also having her start on Cetirizine HCl, azelastine, and ranitidine. Additionally, I am having her maintain her aspirin, Biotin, pravastatin, ELIQUIS, LYRICA, metoprolol tartrate, metoprolol tartrate, spironolactone, prednisoLONE acetate, and ALPRAZolam.  Meds ordered this encounter  Medications  . prednisoLONE acetate (PRED FORTE) 1 % ophthalmic suspension    Sig: INSTILL 1 DROP IN INTO RIGHT EYE 3 TIMES DAILY-1 WEEK,TWICE DAILY-1 WEEK,ONCE DAILY-1WEEK,THEN STOP    Refill:  0  . ALPRAZolam (XANAX) 0.25 MG tablet    Sig: TAKE 1 TABLET BY MOUTH EVERY DAY AS NEEDED FOR ANXIETY    Dispense:  30 tablet    Refill:  5    This request is for a new prescription for a controlled substance as required by Federal/State law.  . Cetirizine HCl 10 MG CAPS    Sig: Take 1 capsule (10 mg total) by mouth daily.    Dispense:  30 capsule    Refill:  11  . azelastine (ASTELIN) 0.1 % nasal spray    Sig: Place 1 spray into both nostrils 2 (two) times daily. Use in each nostril as directed    Dispense:  30 mL    Refill:  12  . ranitidine (ZANTAC) 300 MG tablet    Sig: Take 1 tablet (300 mg total) by mouth at bedtime.    Dispense:  90 tablet    Refill:  3   See AVS for instructions about healthy living and anticipatory guidance.  Follow-up: Return in about 6 months (around 11/30/2015).  Sanda Linger, MD

## 2015-05-30 NOTE — Progress Notes (Signed)
Pre visit review using our clinic review tool, if applicable. No additional management support is needed unless otherwise documented below in the visit note. 

## 2015-06-06 ENCOUNTER — Other Ambulatory Visit: Payer: Self-pay | Admitting: Internal Medicine

## 2015-06-09 ENCOUNTER — Other Ambulatory Visit: Payer: Self-pay | Admitting: Internal Medicine

## 2015-06-14 ENCOUNTER — Other Ambulatory Visit: Payer: Self-pay | Admitting: Internal Medicine

## 2015-06-23 ENCOUNTER — Encounter: Payer: Self-pay | Admitting: Cardiology

## 2015-06-25 ENCOUNTER — Other Ambulatory Visit: Payer: Self-pay | Admitting: Internal Medicine

## 2015-06-27 LAB — CUP PACEART REMOTE DEVICE CHECK
Battery Remaining Longevity: 72 mo
Battery Remaining Percentage: 100 %
Brady Statistic RA Percent Paced: 98 %
Brady Statistic RV Percent Paced: 53 %
Date Time Interrogation Session: 20170502071100
Implantable Lead Implant Date: 20150112
Implantable Lead Implant Date: 20150112
Implantable Lead Location: 753859
Implantable Lead Location: 753860
Implantable Lead Model: 4135
Implantable Lead Model: 4136
Implantable Lead Serial Number: 29308444
Implantable Lead Serial Number: 29569556
Lead Channel Impedance Value: 528 Ohm
Lead Channel Impedance Value: 837 Ohm
Lead Channel Pacing Threshold Amplitude: 0.6 V
Lead Channel Pacing Threshold Amplitude: 1.3 V
Lead Channel Pacing Threshold Pulse Width: 0.4 ms
Lead Channel Pacing Threshold Pulse Width: 0.5 ms
Lead Channel Setting Pacing Amplitude: 1.2 V
Lead Channel Setting Pacing Amplitude: 2.4 V
Lead Channel Setting Pacing Pulse Width: 0.4 ms
Lead Channel Setting Sensing Sensitivity: 3 mV
Pulse Gen Serial Number: 391753

## 2015-06-28 ENCOUNTER — Other Ambulatory Visit: Payer: Self-pay | Admitting: Internal Medicine

## 2015-07-06 ENCOUNTER — Other Ambulatory Visit: Payer: Self-pay | Admitting: Internal Medicine

## 2015-07-10 ENCOUNTER — Other Ambulatory Visit: Payer: Self-pay | Admitting: Internal Medicine

## 2015-07-10 NOTE — Telephone Encounter (Signed)
Faxed script back to [facility].../lmb 

## 2015-08-01 ENCOUNTER — Ambulatory Visit (INDEPENDENT_AMBULATORY_CARE_PROVIDER_SITE_OTHER): Payer: Medicare Other | Admitting: Internal Medicine

## 2015-08-01 ENCOUNTER — Encounter: Payer: Self-pay | Admitting: Internal Medicine

## 2015-08-01 VITALS — BP 140/58 | HR 73 | Temp 98.4°F | Resp 16 | Ht 65.0 in | Wt 178.8 lb

## 2015-08-01 DIAGNOSIS — T148 Other injury of unspecified body region: Secondary | ICD-10-CM | POA: Diagnosis not present

## 2015-08-01 DIAGNOSIS — T148XXA Other injury of unspecified body region, initial encounter: Secondary | ICD-10-CM

## 2015-08-01 MED ORDER — TIZANIDINE HCL 2 MG PO TABS: 2.0000 mg | ORAL_TABLET | Freq: Two times a day (BID) | ORAL | Status: DC | PRN

## 2015-08-01 NOTE — Patient Instructions (Signed)
We have sent in tizanidine which is a muscle relaxer for the pain. You can take 1 pill up to 2 times per day.   You can also try some tylenol. It is okay to take 2 of the 500 mg pills up to 3 times per day. (this is a total of 3000 mg daily that is safe to take).   Keep using the heating pad to help the muscles heal.

## 2015-08-01 NOTE — Assessment & Plan Note (Addendum)
Advised to stop the alleve due to concurrent eliquis. Safe to take tylenol up to 3 grams. Rx for tizanidine as most likely muscular etiology. Continue using heating pad. No spinal tenderness and no imaging needed today.

## 2015-08-01 NOTE — Progress Notes (Signed)
   Subjective:    Patient ID: Deborah Chung, female    DOB: [birth year], 82 y.o.   MRN: [id number]  HPI The patient is an 82 YO female coming in for shoulder and neck pain. She was doing crossword puzzles and straining her shoulders. Taking aleve and biofreeze which are not helpful. Heating pad was helpful. Started about 5 days ago and has not improved at all. She denies any injury or overuse. No numbness or weakness in her right arm. Some pain up and down the arm to start which has gradually improved. No headaches, chest pains, SOB. No fevers or chills or rash.   Review of Systems  Constitutional: Negative.   Respiratory: Negative.   Cardiovascular: Negative.   Gastrointestinal: Negative.   Musculoskeletal: Positive for myalgias, arthralgias and neck pain. Negative for joint swelling, gait problem and neck stiffness.  Neurological: Negative.       Objective:   Physical Exam  Constitutional: She is oriented to person, place, and time. She appears well-developed and well-nourished.  HENT:  Head: Normocephalic and atraumatic.  Eyes: EOM are normal.  Neck: Normal range of motion. No JVD present.  Cardiovascular: Normal rate.   Pulmonary/Chest: Effort normal and breath sounds normal.  Abdominal: Soft.  Musculoskeletal: She exhibits tenderness.  Some tenderness in the scapular region right and shoulder. No pain cervical or thoracic spine.   Lymphadenopathy:    She has no cervical adenopathy.  Neurological: She is alert and oriented to person, place, and time. Coordination normal.  Skin: Skin is warm and dry. No rash noted.  Psychiatric: She has a normal mood and affect.   Filed Vitals:   08/01/15 1412  BP: 140/58  Pulse: 73  Temp: 98.4 F (36.9 C)  TempSrc: Oral  Resp: 16  Height: 5\' 5"  (1.651 m)  Weight: 178 lb 12.8 oz (81.103 kg)  SpO2: 98%      Assessment & Plan:

## 2015-08-01 NOTE — Progress Notes (Signed)
Pre visit review using our clinic review tool, if applicable. No additional management support is needed unless otherwise documented below in the visit note. 

## 2015-08-16 ENCOUNTER — Ambulatory Visit (INDEPENDENT_AMBULATORY_CARE_PROVIDER_SITE_OTHER): Payer: Medicare Other | Admitting: *Deleted

## 2015-08-16 DIAGNOSIS — I480 Paroxysmal atrial fibrillation: Secondary | ICD-10-CM

## 2015-08-16 DIAGNOSIS — Z950 Presence of cardiac pacemaker: Secondary | ICD-10-CM | POA: Diagnosis not present

## 2015-08-16 NOTE — Progress Notes (Signed)
Remote pacemaker transmission.   

## 2015-08-21 ENCOUNTER — Encounter: Payer: Self-pay | Admitting: Internal Medicine

## 2015-08-21 ENCOUNTER — Ambulatory Visit (INDEPENDENT_AMBULATORY_CARE_PROVIDER_SITE_OTHER): Payer: Medicare Other | Admitting: Internal Medicine

## 2015-08-21 ENCOUNTER — Encounter (INDEPENDENT_AMBULATORY_CARE_PROVIDER_SITE_OTHER): Payer: Self-pay

## 2015-08-21 VITALS — BP 123/74 | HR 70 | Ht 65.5 in | Wt 177.4 lb

## 2015-08-21 DIAGNOSIS — R197 Diarrhea, unspecified: Secondary | ICD-10-CM

## 2015-08-21 DIAGNOSIS — I428 Other cardiomyopathies: Secondary | ICD-10-CM

## 2015-08-21 DIAGNOSIS — I10 Essential (primary) hypertension: Secondary | ICD-10-CM

## 2015-08-21 DIAGNOSIS — I480 Paroxysmal atrial fibrillation: Secondary | ICD-10-CM

## 2015-08-21 DIAGNOSIS — E785 Hyperlipidemia, unspecified: Secondary | ICD-10-CM

## 2015-08-21 DIAGNOSIS — Z950 Presence of cardiac pacemaker: Secondary | ICD-10-CM | POA: Diagnosis not present

## 2015-08-21 DIAGNOSIS — I429 Cardiomyopathy, unspecified: Secondary | ICD-10-CM

## 2015-08-21 NOTE — Patient Instructions (Signed)
Your physician recommends that you continue on your current medications as directed. Please refer to the Current Medication list given to you today.  Dr. Rennis Golden recommends:  -- Immodium (OTC) twice daily - continue for 1 day after diarrhea stops -- Stay well hydrated  Your physician wants you to follow-up in: 6 months with Dr. Rennis Golden. You will receive a reminder letter in the mail two months in advance. If you don't receive a letter, please call our office to schedule the follow-up appointment.

## 2015-08-21 NOTE — Progress Notes (Signed)
OFFICE NOTE  Chief Complaint:  Diarrhea  Primary Care Physician: Sanda Linger, MD  HPI:  Deborah Chung is a pleasant 82 year old female who recently moved to [place] from [place]. She was apparently living in [place], somewhere outside of [place]. Her son is Renisha Cockrum, who is the in-house counsel for the [facility] system. She was previously seeing [facility] and had an episode in February of 2015. Apparently she had a mobile health screening and was found to be out of rhythm. She was sent to her primary care doctor who did an EKG and noted she was in A. fib and was sent immediately to the hospital. As the story goes, she was on several treatments for her A. fib and apparently eventually underwent cardiac catheterization and/or possibly an ablation procedure for which she developed asystole or some type of arrhythmia requiring her to be resuscitated. Obviously this was successful and she received apparently a pacemaker. She is not clear as to what the type of pacemaker wasn't did not provide documentation today. I've not yet received any records from her cardiologist in [place]. She was placed on Eliquis for anticoagulation. In addition, it appears she is on digoxin and Aldactone along with Lopressor which is suggestive of possible cardiomyopathy. She apparently does have a heart failure diagnosis however I'm not certain as to what her ejection fraction is. She reports a remote check device for her pacemaker and did a remote check apparently last week which went to [place]. Symptomatically she denies any chest pain or shortness of breath.  I saw Deborah Chung back today in the office. She is doing fairly well. She saw Dr. Lewayne Bunting in November who performed a pacemaker check and felt that she was doing well in sinus rhythm. There is no evidence for recurrent atrial fibrillation. She has yet to be enrolled in home monitoring. Twice she has  requested a home monitor but it has not been yet sent to her. I will need to look into why she has not received her home monitoring device. 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 Eyes a chest pain or worsening shortness of breath. As mentioned recent laboratory work from her primary shows excellent cholesterol control with total cholesterol 174, triglycerides 124, HDL 59 and LDL of 90.  At the pleasure seeing Deborah Chung back in the office today. She reports doing fairly well. She still has some issues with swelling in her legs and discomfort with achiness and heaviness. She's had bilateral knee surgeries and is noted to have some varicose veins as well as some discoloration over her ankles. From a cardiac standpoint she had an echo which shows preserved systolic function, this represents a marked improvement since her EF was reported around 10% at one point but this may been after cardiac arrest when she got her pacemaker placed. Pacemaker function appears to be normal. She's had little if any atrial fibrillation and is on Eliquis for a CHADSVASC score of 4.  I saw Deborah Chung back today in the office. Overall she is feeling well. She denies any worsening shortness of breath or swelling. EF is now normalized. She follows with Dr. Ladona Ridgel for pacemaker. She's had no bleeding problems on Eliquis. She is on low-dose digoxin which will require monitoring. She is overdue for cholesterol check.  08/21/2015  Deborah Chung returns today for follow-up. In the interim she had a pacer remote check with a stable burden of atrial fibrillation. She continues to do  well on Eliquis. We discontinued her digoxin and her last office visit for a borderline elevated digoxin level and the fact that it is really not indicated for A. fib and her congestive heart failure has improved with normal LV function. Blood pressure is well-controlled today. Her only other complaint is that she's had diarrhea for approximately 7 days.  There is no fever or chills associated with that. He came on abruptly. She denied any sick contacts. She is using Imodium for this.  PMHx:  Past Medical History:  Diagnosis Date  . Atrial fibrillation (HCC)   . CHF (congestive heart failure) (HCC)   . Dyslipidemia   . Essential hypertension, benign   . GAD (generalized anxiety disorder)   . Hearing loss   . Hyperlipidemia   . Hypertension   . PHN (postherpetic neuralgia) 2010  . PHN (postherpetic neuralgia)   . Systolic CHF [facility])     Past Surgical History:  Procedure Laterality Date  . ABDOMINAL HYSTERECTOMY    . PACEMAKER PLACEMENT      FAMHx:  No family history on file.  SOCHx:   reports that she quit smoking about 27 years ago. She quit after 25.00 years of use. She has never used smokeless tobacco. She reports that she does not drink alcohol or use drugs.  ALLERGIES:  No Known Allergies  ROS: Pertinent items noted in HPI and remainder of comprehensive ROS otherwise negative.  HOME MEDS: Current Outpatient Prescriptions  Medication Sig Dispense Refill  . ALPRAZolam (XANAX) 0.25 MG tablet TAKE 1 TABLET BY MOUTH EVERY DAY AS NEEDED FOR ANXIETY 30 tablet 5  . apixaban (ELIQUIS) 2.5 MG TABS tablet Take 1 tablet (2.5 mg total) by mouth 2 (two) times daily. 180 tablet 1  . aspirin 81 MG tablet Take 81 mg by mouth daily.    Marland Kitchen azelastine (ASTELIN) 0.1 % nasal spray Place 1 spray into both nostrils 2 (two) times daily. Use in each nostril as directed 30 mL 12  . Biotin 10 MG TABS Take 1 tablet by mouth daily.    . Cetirizine HCl 10 MG CAPS Take 1 capsule (10 mg total) by mouth daily. 30 capsule 11  . LYRICA 75 MG capsule TAKE ONE CAPSULE BY MOUTH 3 TIMES A DAY 270 capsule 1  . metoprolol tartrate (LOPRESSOR) 25 MG tablet TAKE 1 TABLET BY MOUTH TWICE A DAY 180 tablet 1  . pantoprazole (PROTONIX) 40 MG tablet TAKE 1 TABLET BY MOUTH EVERY DAY 90 tablet 3  . pravastatin (PRAVACHOL) 20 MG tablet TAKE 1 TABLET BY MOUTH EVERY DAY  90 tablet 3  . ranitidine (ZANTAC) 300 MG tablet Take 1 tablet (300 mg total) by mouth at bedtime. 90 tablet 3  . spironolactone (ALDACTONE) 25 MG tablet Take 1 tablet (25 mg total) by mouth daily. 90 tablet 1   No current facility-administered medications for this visit.     LABS/IMAGING: No results found for this or any previous visit (from the past 48 hour(s)). No results found.  VITALS: BP 123/74   Pulse 70   Ht 5' 5.5" (1.664 m)   Wt 177 lb 6.4 oz (80.5 kg)   BMI 29.07 kg/m   EXAM: General appearance: alert and no distress Neck: no carotid bruit and no JVD Lungs: clear to auscultation bilaterally and pacemaker in well-healed pocket in the left upper chest Heart: regular rate and rhythm, S1, S2 normal, no murmur, click, rub or gallop Abdomen: soft, non-tender; bowel sounds normal; no masses,  no organomegaly  Extremities: extremities normal, atraumatic, no cyanosis or edema, varicose veins noted and Shiny skin without pitting edema Pulses: 2+ and symmetric Skin: Skin color, texture, turgor normal. No rashes or lesions Neurologic: Grossly normal Psych: NOrmal  EKG: Atrial paced rhythm at 70  ASSESSMENT: 1. Diarrhea 2. History of atrial fibrillation on anticoagulation with Eliquis (CHADVASC 4) 3. Hypertension 4. Dyslipidemia 5. Status post pacemaker - for sinus node dysfunction and complete heart block 6. Reported history of EF of 10-15%, now improved to 55-60%  PLAN: 1.  Mrs. Mccrossen has had a watery diarrhea for approximately 1 week. She's been using Imodium. She denies any fevers or chills or sick contacts. She seems to be able to hydrate herself well enough. I advised her to use the Imodium on a scheduled basis twice daily for at least 24 hours past when the diarrhea resolves. If the symptoms have not resolved after an additional week, she should contact her primary care provider and perhaps get stool testing. Her EF has been stable 55-60%. She's had no bleeding,  patient on Eliquis. Blood pressure is at goal today. We'll continue current medications and follow-up in 6 months.  Chrystie Nose, MD, [facility] Attending Cardiologist [facility]  Lisette Abu Octavia Velador 08/21/2015, 1:19 PM

## 2015-08-23 ENCOUNTER — Encounter: Payer: Self-pay | Admitting: Cardiology

## 2015-08-24 ENCOUNTER — Telehealth: Payer: Self-pay | Admitting: Internal Medicine

## 2015-08-24 NOTE — Telephone Encounter (Signed)
Pt called to state that she was recommended to start immodium for her diarrhea after seeing Dr. Rennis Golden for OV on 8/7. She took for a couple days, and noted while on it she had "red bumps all over my face". She notes the rash/bumps have resolved and she has also stopped the immodium.  I offered to check into possible allergies for this med, she states politely she doesn't think this is necessary, since it is gone and she just thinks she will avoid using immodium. She informs me that she will call, however, if the rash returns. Advised this was fine and gave her the option of following up with PCP as well. Pt voiced understanding and thanks.

## 2015-08-24 NOTE — Telephone Encounter (Signed)
New message   Pt verbalized that she got out the shower and she has some bumps on her face and that it Is not a rash. Pt states that she doubts that it is from her medication   She wants rn to call her back

## 2015-08-28 ENCOUNTER — Ambulatory Visit (INDEPENDENT_AMBULATORY_CARE_PROVIDER_SITE_OTHER): Payer: Medicare Other | Admitting: Internal Medicine

## 2015-08-28 ENCOUNTER — Other Ambulatory Visit (INDEPENDENT_AMBULATORY_CARE_PROVIDER_SITE_OTHER): Payer: Medicare Other

## 2015-08-28 ENCOUNTER — Encounter: Payer: Self-pay | Admitting: Internal Medicine

## 2015-08-28 VITALS — BP 124/68 | HR 98 | Temp 97.6°F | Resp 16 | Ht 65.5 in | Wt 177.5 lb

## 2015-08-28 DIAGNOSIS — I10 Essential (primary) hypertension: Secondary | ICD-10-CM

## 2015-08-28 DIAGNOSIS — R197 Diarrhea, unspecified: Secondary | ICD-10-CM

## 2015-08-28 DIAGNOSIS — A09 Infectious gastroenteritis and colitis, unspecified: Secondary | ICD-10-CM | POA: Diagnosis not present

## 2015-08-28 LAB — COMPREHENSIVE METABOLIC PANEL
ALT: 13 U/L (ref 0–35)
AST: 18 U/L (ref 0–37)
Albumin: 4 g/dL (ref 3.5–5.2)
Alkaline Phosphatase: 63 U/L (ref 39–117)
BUN: 12 mg/dL (ref 6–23)
CO2: 30 mEq/L (ref 19–32)
Calcium: 9.2 mg/dL (ref 8.4–10.5)
Chloride: 105 mEq/L (ref 96–112)
Creatinine, Ser: 0.98 mg/dL (ref 0.40–1.20)
GFR: 57.02 mL/min — ABNORMAL LOW (ref >60.00)
Glucose, Bld: 91 mg/dL (ref 70–99)
Potassium: 4.7 mEq/L (ref 3.5–5.1)
Sodium: 140 mEq/L (ref 135–145)
Total Bilirubin: 0.5 mg/dL (ref 0.2–1.2)
Total Protein: 6.7 g/dL (ref 6.0–8.3)

## 2015-08-28 LAB — CBC WITH DIFFERENTIAL/PLATELET
Basophils Absolute: 0 10*3/uL (ref 0.0–0.1)
Basophils Relative: 0.4 % (ref 0.0–3.0)
Eosinophils Absolute: 0.2 10*3/uL (ref 0.0–0.7)
Eosinophils Relative: 3.3 % (ref 0.0–5.0)
HCT: 42.3 % (ref 36.0–46.0)
Hemoglobin: 14.3 g/dL (ref 12.0–15.0)
Lymphocytes Relative: 31.2 % (ref 12.0–46.0)
Lymphs Abs: 1.9 10*3/uL (ref 0.7–4.0)
MCHC: 33.7 g/dL (ref 30.0–36.0)
MCV: 94.3 fl (ref 78.0–100.0)
Monocytes Absolute: 1 10*3/uL (ref 0.1–1.0)
Monocytes Relative: 16.2 % — ABNORMAL HIGH (ref 3.0–12.0)
Neutro Abs: 3 10*3/uL (ref 1.4–7.7)
Neutrophils Relative %: 48.9 % (ref 43.0–77.0)
Platelets: 184 10*3/uL (ref 150.0–400.0)
RBC: 4.49 Mil/uL (ref 3.87–5.11)
RDW: 13.7 % (ref 11.5–15.5)
WBC: 6.1 10*3/uL (ref 4.0–10.5)

## 2015-08-28 NOTE — Progress Notes (Signed)
Pre visit review using our clinic review tool, if applicable. No additional management support is needed unless otherwise documented below in the visit note. 

## 2015-08-28 NOTE — Patient Instructions (Signed)

## 2015-08-28 NOTE — Progress Notes (Signed)
Subjective:  Patient ID: Deborah Chung, female    DOB: [birth year]  Age: 82 y.o. MRN: [id number]  CC: Diarrhea   HPI Deborah Chung presents for a 10 day history of diarrhea. She reports about 3 loose bowel movements per day. She has been getting symptom relief with Pepto-Bismol and Imodium. At the onset she had a few loose stools during the night that woke her up but since then she has not had any diarrhea during the night. She reports slight cramping but no abdominal pain. She is not aware of any particular foods like dairy products or sweets that are causing the diarrhea and she has not recently taken a course of antibiotics. She denies loss of appetite, weight loss, bloody stool, fever, chills, rash, dysuria, or hematuria.  Outpatient Medications Prior to Visit  Medication Sig Dispense Refill  . ALPRAZolam (XANAX) 0.25 MG tablet TAKE 1 TABLET BY MOUTH EVERY DAY AS NEEDED FOR ANXIETY 30 tablet 5  . apixaban (ELIQUIS) 2.5 MG TABS tablet Take 1 tablet (2.5 mg total) by mouth 2 (two) times daily. 180 tablet 1  . aspirin 81 MG tablet Take 81 mg by mouth daily.    Marland Kitchen azelastine (ASTELIN) 0.1 % nasal spray Place 1 spray into both nostrils 2 (two) times daily. Use in each nostril as directed 30 mL 12  . Biotin 10 MG TABS Take 1 tablet by mouth daily.    . Cetirizine HCl 10 MG CAPS Take 1 capsule (10 mg total) by mouth daily. 30 capsule 11  . LYRICA 75 MG capsule TAKE ONE CAPSULE BY MOUTH 3 TIMES A DAY 270 capsule 1  . metoprolol tartrate (LOPRESSOR) 25 MG tablet TAKE 1 TABLET BY MOUTH TWICE A DAY 180 tablet 1  . pantoprazole (PROTONIX) 40 MG tablet TAKE 1 TABLET BY MOUTH EVERY DAY 90 tablet 3  . pravastatin (PRAVACHOL) 20 MG tablet TAKE 1 TABLET BY MOUTH EVERY DAY 90 tablet 3  . ranitidine (ZANTAC) 300 MG tablet Take 1 tablet (300 mg total) by mouth at bedtime. 90 tablet 3  . spironolactone (ALDACTONE) 25 MG tablet Take 1 tablet (25 mg total) by mouth daily. 90 tablet 1   No facility-administered  medications prior to visit.     ROS Review of Systems  Constitutional: Negative for activity change, appetite change, chills, diaphoresis, fatigue and fever.  HENT: Negative.   Eyes: Negative.   Respiratory: Negative.  Negative for cough, chest tightness, shortness of breath and stridor.   Cardiovascular: Negative.  Negative for chest pain, palpitations and leg swelling.  Gastrointestinal: Positive for diarrhea. Negative for abdominal pain, blood in stool, constipation, nausea and vomiting.  Endocrine: Negative.   Genitourinary: Negative.   Musculoskeletal: Negative.  Negative for back pain, myalgias and neck pain.  Skin: Negative.  Negative for color change and rash.  Allergic/Immunologic: Negative.   Neurological: Negative.  Negative for dizziness, weakness and headaches.  Hematological: Negative.  Negative for adenopathy. Does not bruise/bleed easily.  Psychiatric/Behavioral: Negative.     Objective:  BP 124/68 (BP Location: Left Arm, Patient Position: Sitting, Cuff Size: Normal)   Pulse 98   Temp 97.6 F (36.4 C) (Oral)   Resp 16   Ht 5' 5.5" (1.664 m)   Wt 177 lb 8 oz (80.5 kg)   SpO2 95%   BMI 29.09 kg/m   BP Readings from Last 3 Encounters:  08/28/15 124/68  08/21/15 123/74  08/01/15 (!) 140/58    Wt Readings from Last 3 Encounters:  08/28/15 177  lb 8 oz (80.5 kg)  08/21/15 177 lb 6.4 oz (80.5 kg)  08/01/15 178 lb 12.8 oz (81.1 kg)    Physical Exam  Constitutional: She is oriented to person, place, and time.  Non-toxic appearance. She does not have a sickly appearance. She does not appear ill. No distress.  HENT:  Mouth/Throat: Oropharynx is clear and moist. No oropharyngeal exudate.  Eyes: Conjunctivae are normal. Right eye exhibits no discharge. Left eye exhibits no discharge. No scleral icterus.  Neck: Normal range of motion. Neck supple. No JVD present. No tracheal deviation present. No thyromegaly present.  Cardiovascular: Normal rate, regular rhythm,  normal heart sounds and intact distal pulses.  Exam reveals no gallop and no friction rub.   No murmur heard. Pulmonary/Chest: Effort normal and breath sounds normal. No stridor. No respiratory distress. She has no wheezes. She has no rales. She exhibits no tenderness.  Abdominal: Soft. Bowel sounds are normal. She exhibits no distension and no mass. There is no tenderness. There is no rebound and no guarding.  Musculoskeletal: Normal range of motion. She exhibits no edema, tenderness or deformity.  Lymphadenopathy:    She has no cervical adenopathy.  Neurological: She is oriented to person, place, and time.  Skin: Skin is warm and dry. No rash noted. She is not diaphoretic. No erythema. No pallor.  Vitals reviewed.   Lab Results  Component Value Date   WBC 6.1 08/28/2015   HGB 14.3 08/28/2015   HCT 42.3 08/28/2015   PLT 184.0 08/28/2015   GLUCOSE 91 08/28/2015   CHOL 192 02/22/2015   TRIG 223 (H) 02/22/2015   HDL 41 (L) 02/22/2015   LDLDIRECT 106.0 05/16/2014   LDLCALC 106 02/22/2015   ALT 13 08/28/2015   AST 18 08/28/2015   NA 140 08/28/2015   K 4.7 08/28/2015   CL 105 08/28/2015   CREATININE 0.98 08/28/2015   BUN 12 08/28/2015   CO2 30 08/28/2015   TSH 1.61 05/30/2015   HGBA1C 5.6 05/30/2015    Dg Tibia/fibula Left  Result Date: 05/16/2014 CLINICAL DATA:  Pain bruising and swelling over the distal lower leg following injury 1 month ago EXAM: LEFT TIBIA AND FIBULA - 2 VIEW COMPARISON:  None. FINDINGS: There is a prosthetic right hip joint. The visualized portions of the prosthesis are unremarkable. The left tibia and fibula are adequately mineralized. There is no acute or healing fracture. There is no lytic nor blastic lesion. The soft tissues exhibit no abnormal densities. Mild soft tissue swelling anteriorly is noted over the lower leg. IMPRESSION: There is no acute bony abnormality of the left tibia or fibula. There is soft tissue swelling distally especially anteriorly.  Electronically Signed   By: David  Swazil[place]Jordan M.D.   On: 05/16/2014 15:02    Assessment & Plan:   Deborah KitchensBobbie was seen today for diarrhea.  Diagnoses and all orders for this visit:  Diarrhea of presumed infectious origin- her white cell count is normal so I don't think she has C. difficile infection or any serious bacterial gastroenteritis, her electrolytes and renal function are normal so she does not appear to be dehydrated, I ordered labs to screen for celiac disease and will check a stool pathogen panel by PCR to see if she has C. difficile infection or some other bacterial pathogen that may need to be treated. She will continue Imodium as needed for symptom relief. -     CBC with Differential/Platelet; Future -     Gliadin antibodies, serum -  Tissue transglutaminase, IgA -     Reticulin Antibody, IgA w reflex titer -     Fecal lactoferrin, quant; Future -     GI pathogen panel by PCR, stool; Future  Essential hypertension, benign- her blood pressure is well-controlled, electrolytes and renal function are stable. -     Comprehensive metabolic panel; Future   I am having Deborah Chung maintain her aspirin, Biotin, metoprolol tartrate, ALPRAZolam, Cetirizine HCl, azelastine, ranitidine, apixaban, pantoprazole, spironolactone, pravastatin, and LYRICA.  No orders of the defined types were placed in this encounter.    Follow-up: Return in about 3 weeks (around 09/18/2015).  Sanda Linger, MD

## 2015-08-29 ENCOUNTER — Other Ambulatory Visit: Payer: Medicare Other

## 2015-08-29 DIAGNOSIS — R197 Diarrhea, unspecified: Secondary | ICD-10-CM

## 2015-08-30 LAB — GASTROINTESTINAL PATHOGEN PANEL PCR
C. difficile Tox A/B, PCR: NOT DETECTED
Campylobacter, PCR: NOT DETECTED
Cryptosporidium, PCR: NOT DETECTED
E coli (ETEC) LT/ST PCR: NOT DETECTED
E coli (STEC) stx1/stx2, PCR: NOT DETECTED
E coli 0157, PCR: NOT DETECTED
Giardia lamblia, PCR: NOT DETECTED
Norovirus, PCR: NOT DETECTED
Rotavirus A, PCR: NOT DETECTED
Salmonella, PCR: NOT DETECTED
Shigella, PCR: NOT DETECTED

## 2015-08-30 LAB — FECAL LACTOFERRIN, QUANT: Lactoferrin: POSITIVE

## 2015-08-31 ENCOUNTER — Telehealth: Payer: Self-pay

## 2015-08-31 ENCOUNTER — Other Ambulatory Visit: Payer: Self-pay | Admitting: Internal Medicine

## 2015-08-31 DIAGNOSIS — K6389 Other specified diseases of intestine: Secondary | ICD-10-CM

## 2015-08-31 MED ORDER — RIFAXIMIN 550 MG PO TABS: 550.0000 mg | ORAL_TABLET | Freq: Three times a day (TID) | ORAL | 0 refills | Status: AC

## 2015-08-31 NOTE — Telephone Encounter (Signed)
Rec'd call from pt son stating mom was contacted by someone stating that the med MD rx had been approved, but whn she went to pick it up [facility] told her that it still wasn't. Inform son per chart PA was approved this am. I will contact [facility] to have them to submit it again, and call him back w/status. Called [facility] spoke w/Natalie gave her info on the PA she re-ran the insurance med was approved cost $60. Called son back gave him status should be able to get today...Raechel Chute

## 2015-08-31 NOTE — Telephone Encounter (Signed)
PA initiated and APPROVED via CoverMyMeds, key [id number]

## 2015-09-05 LAB — CUP PACEART REMOTE DEVICE CHECK
Battery Remaining Longevity: 72 mo
Battery Remaining Percentage: 100 %
Brady Statistic RA Percent Paced: 96 %
Brady Statistic RV Percent Paced: 50 %
Date Time Interrogation Session: 20170802071100
Implantable Lead Implant Date: 20150112
Implantable Lead Implant Date: 20150112
Implantable Lead Location: 753859
Implantable Lead Location: 753860
Implantable Lead Model: 4135
Implantable Lead Model: 4136
Implantable Lead Serial Number: 29308444
Implantable Lead Serial Number: 29569556
Lead Channel Impedance Value: 550 Ohm
Lead Channel Impedance Value: 902 Ohm
Lead Channel Pacing Threshold Amplitude: 0.6 V
Lead Channel Pacing Threshold Amplitude: 1.3 V
Lead Channel Pacing Threshold Pulse Width: 0.4 ms
Lead Channel Pacing Threshold Pulse Width: 0.5 ms
Lead Channel Setting Pacing Amplitude: 1.1 V
Lead Channel Setting Pacing Amplitude: 2.4 V
Lead Channel Setting Pacing Pulse Width: 0.4 ms
Lead Channel Setting Sensing Sensitivity: 3 mV
Pulse Gen Serial Number: 391753

## 2015-09-11 ENCOUNTER — Ambulatory Visit (INDEPENDENT_AMBULATORY_CARE_PROVIDER_SITE_OTHER): Payer: Medicare Other | Admitting: Internal Medicine

## 2015-09-11 ENCOUNTER — Encounter: Payer: Self-pay | Admitting: Internal Medicine

## 2015-09-11 VITALS — BP 118/68 | HR 70 | Temp 97.7°F | Resp 16 | Ht 65.5 in | Wt 174.0 lb

## 2015-09-11 DIAGNOSIS — I480 Paroxysmal atrial fibrillation: Secondary | ICD-10-CM

## 2015-09-11 DIAGNOSIS — I10 Essential (primary) hypertension: Secondary | ICD-10-CM

## 2015-09-11 DIAGNOSIS — Z23 Encounter for immunization: Secondary | ICD-10-CM

## 2015-09-11 DIAGNOSIS — K6389 Other specified diseases of intestine: Secondary | ICD-10-CM | POA: Diagnosis not present

## 2015-09-11 DIAGNOSIS — R197 Diarrhea, unspecified: Secondary | ICD-10-CM | POA: Diagnosis not present

## 2015-09-11 NOTE — Progress Notes (Signed)
Subjective:  Patient ID: Deborah Chung, female    DOB: [birth year]  Age: 82 y.o. MRN: [id number]  CC: Diarrhea   HPI Deborah Chung presents for follow-up on diarrhea. She has been taking Xifaxan and tells me her bowel movements are normal now. Her stool studies were only remarkable for a positive lactoferrin. She denies any recent episodes of abdominal pain, nausea, vomiting, fever, chills, rash, loss of appetite, or weight loss.  Outpatient Medications Prior to Visit  Medication Sig Dispense Refill  . ALPRAZolam (XANAX) 0.25 MG tablet TAKE 1 TABLET BY MOUTH EVERY DAY AS NEEDED FOR ANXIETY 30 tablet 5  . apixaban (ELIQUIS) 2.5 MG TABS tablet Take 1 tablet (2.5 mg total) by mouth 2 (two) times daily. 180 tablet 1  . aspirin 81 MG tablet Take 81 mg by mouth daily.    Marland Kitchen azelastine (ASTELIN) 0.1 % nasal spray Place 1 spray into both nostrils 2 (two) times daily. Use in each nostril as directed 30 mL 12  . Biotin 10 MG TABS Take 1 tablet by mouth daily.    . Cetirizine HCl 10 MG CAPS Take 1 capsule (10 mg total) by mouth daily. 30 capsule 11  . LYRICA 75 MG capsule TAKE ONE CAPSULE BY MOUTH 3 TIMES A DAY 270 capsule 1  . metoprolol tartrate (LOPRESSOR) 25 MG tablet TAKE 1 TABLET BY MOUTH TWICE A DAY 180 tablet 1  . pantoprazole (PROTONIX) 40 MG tablet TAKE 1 TABLET BY MOUTH EVERY DAY 90 tablet 3  . pravastatin (PRAVACHOL) 20 MG tablet TAKE 1 TABLET BY MOUTH EVERY DAY 90 tablet 3  . ranitidine (ZANTAC) 300 MG tablet Take 1 tablet (300 mg total) by mouth at bedtime. 90 tablet 3  . rifaximin (XIFAXAN) 550 MG TABS tablet Take 1 tablet (550 mg total) by mouth 3 (three) times daily. 42 tablet 0  . spironolactone (ALDACTONE) 25 MG tablet Take 1 tablet (25 mg total) by mouth daily. 90 tablet 1   No facility-administered medications prior to visit.     ROS Review of Systems  Constitutional: Negative.  Negative for activity change, appetite change, chills, fatigue and fever.  HENT: Negative.   Eyes:  Negative.   Respiratory: Negative.  Negative for cough, choking, chest tightness, shortness of breath and stridor.   Cardiovascular: Negative.  Negative for chest pain, palpitations and leg swelling.  Gastrointestinal: Negative.  Negative for abdominal pain, constipation, diarrhea, nausea and vomiting.  Endocrine: Negative.   Genitourinary: Negative.   Musculoskeletal: Negative.  Negative for arthralgias, back pain, joint swelling and myalgias.  Skin: Negative.  Negative for color change and rash.  Allergic/Immunologic: Negative.   Neurological: Negative.  Negative for dizziness.  Hematological: Negative.  Negative for adenopathy. Does not bruise/bleed easily.  Psychiatric/Behavioral: Negative.     Objective:  BP 118/68 (BP Location: Left Arm, Patient Position: Sitting, Cuff Size: Normal)   Pulse 70   Temp 97.7 F (36.5 C) (Oral)   Resp 16   Ht 5' 5.5" (1.664 m)   Wt 174 lb (78.9 kg)   SpO2 94%   BMI 28.51 kg/m   BP Readings from Last 3 Encounters:  09/11/15 118/68  08/28/15 124/68  08/21/15 123/74    Wt Readings from Last 3 Encounters:  09/11/15 174 lb (78.9 kg)  08/28/15 177 lb 8 oz (80.5 kg)  08/21/15 177 lb 6.4 oz (80.5 kg)    Physical Exam  Constitutional: She is oriented to person, place, and time. No distress.  HENT:  Mouth/Throat: Oropharynx  is clear and moist. No oropharyngeal exudate.  Eyes: Conjunctivae are normal. Right eye exhibits no discharge. Left eye exhibits no discharge. No scleral icterus.  Neck: Normal range of motion. Neck supple. No JVD present. No tracheal deviation present. No thyromegaly present.  Cardiovascular: Normal rate, regular rhythm, normal heart sounds and intact distal pulses.  Exam reveals no gallop and no friction rub.   No murmur heard. Pulmonary/Chest: Effort normal and breath sounds normal. No stridor. No respiratory distress. She has no wheezes. She has no rales. She exhibits no tenderness.  Abdominal: Soft. Bowel sounds are  normal. She exhibits no distension and no mass. There is no tenderness. There is no rebound and no guarding.  Musculoskeletal: Normal range of motion. She exhibits no edema, tenderness or deformity.  Lymphadenopathy:    She has no cervical adenopathy.  Neurological: She is oriented to person, place, and time.  Skin: Skin is warm and dry. No rash noted. She is not diaphoretic. No erythema. No pallor.  Vitals reviewed.   Lab Results  Component Value Date   WBC 6.1 08/28/2015   HGB 14.3 08/28/2015   HCT 42.3 08/28/2015   PLT 184.0 08/28/2015   GLUCOSE 91 08/28/2015   CHOL 192 02/22/2015   TRIG 223 (H) 02/22/2015   HDL 41 (L) 02/22/2015   LDLDIRECT 106.0 05/16/2014   LDLCALC 106 02/22/2015   ALT 13 08/28/2015   AST 18 08/28/2015   NA 140 08/28/2015   K 4.7 08/28/2015   CL 105 08/28/2015   CREATININE 0.98 08/28/2015   BUN 12 08/28/2015   CO2 30 08/28/2015   TSH 1.61 05/30/2015   HGBA1C 5.6 05/30/2015    Dg Tibia/fibula Left  Result Date: 05/16/2014 CLINICAL DATA:  Pain bruising and swelling over the distal lower leg following injury 1 month ago EXAM: LEFT TIBIA AND FIBULA - 2 VIEW COMPARISON:  None. FINDINGS: There is a prosthetic right hip joint. The visualized portions of the prosthesis are unremarkable. The left tibia and fibula are adequately mineralized. There is no acute or healing fracture. There is no lytic nor blastic lesion. The soft tissues exhibit no abnormal densities. Mild soft tissue swelling anteriorly is noted over the lower leg. IMPRESSION: There is no acute bony abnormality of the left tibia or fibula. There is soft tissue swelling distally especially anteriorly. Electronically Signed   By: David  [place]Jordan M.D.   On: 05/16/2014 15:02    Assessment & Plan:   Deborah KitchensBobbie was seen today for diarrhea.  Diagnoses and all orders for this visit:  Diarrhea, unspecified type- her celiac panel was not done during the last visit, I reordered it today in hopes that we can screen  her for celiac disease. -     Gliadin antibodies, serum; Future -     Tissue transglutaminase, IgA; Future -     Reticulin Antibody, IgA w reflex titer; Future  Bacterial overgrowth syndrome- symptoms have resolved with Xifaxan.  Essential hypertension, benign- her blood pressure is adequately well controlled  Paroxysmal atrial fibrillation (HCC)- she has good rate and rhythm control   I am having Deborah Chung maintain her aspirin, Biotin, metoprolol tartrate, ALPRAZolam, Cetirizine HCl, azelastine, ranitidine, apixaban, pantoprazole, spironolactone, pravastatin, LYRICA, and rifaximin.  No orders of the defined types were placed in this encounter.    Follow-up: Return if symptoms worsen or fail to improve.  Sanda Lingerhomas Hayes Czaja, MD

## 2015-09-11 NOTE — Patient Instructions (Signed)

## 2015-09-11 NOTE — Progress Notes (Signed)
Pre visit review using our clinic review tool, if applicable. No additional management support is needed unless otherwise documented below in the visit note. 

## 2015-09-11 NOTE — Addendum Note (Signed)
Addended by: Verlan Friends on: 09/11/2015 03:55 PM   Modules accepted: Orders

## 2015-09-14 ENCOUNTER — Telehealth: Payer: Self-pay | Admitting: Emergency Medicine

## 2015-09-14 NOTE — Telephone Encounter (Signed)
Pt called and stated she is still having diarrhea. She wants to know if there is something else she can take or what she should do? Please follow up thanks.

## 2015-09-15 NOTE — Telephone Encounter (Signed)
Pt states that she did not have diarrhea yesterday and wanted to know if she needed to continue the imodium.   Informed pt that she can continue the bland that she has following for 3-4 more days, start a probiotic and to dc the imodium unless diarrhea comes back.

## 2015-09-17 ENCOUNTER — Encounter (HOSPITAL_COMMUNITY): Payer: Self-pay | Admitting: Nurse Practitioner

## 2015-09-17 ENCOUNTER — Emergency Department (HOSPITAL_COMMUNITY)
Admission: EM | Admit: 2015-09-17 | Discharge: 2015-09-17 | Disposition: A | Discharge status: Home / Self Care | Payer: Medicare Other | Attending: Emergency Medicine | Admitting: Emergency Medicine

## 2015-09-17 ENCOUNTER — Emergency Department (HOSPITAL_COMMUNITY): Payer: Medicare Other

## 2015-09-17 DIAGNOSIS — R197 Diarrhea, unspecified: Secondary | ICD-10-CM | POA: Insufficient documentation

## 2015-09-17 DIAGNOSIS — Z79899 Other long term (current) drug therapy: Secondary | ICD-10-CM | POA: Insufficient documentation

## 2015-09-17 DIAGNOSIS — Z7982 Long term (current) use of aspirin: Secondary | ICD-10-CM | POA: Diagnosis not present

## 2015-09-17 DIAGNOSIS — R109 Unspecified abdominal pain: Secondary | ICD-10-CM | POA: Diagnosis present

## 2015-09-17 DIAGNOSIS — I110 Hypertensive heart disease with heart failure: Secondary | ICD-10-CM | POA: Diagnosis not present

## 2015-09-17 DIAGNOSIS — Z87891 Personal history of nicotine dependence: Secondary | ICD-10-CM | POA: Insufficient documentation

## 2015-09-17 DIAGNOSIS — I5020 Unspecified systolic (congestive) heart failure: Secondary | ICD-10-CM | POA: Insufficient documentation

## 2015-09-17 DIAGNOSIS — N390 Urinary tract infection, site not specified: Secondary | ICD-10-CM | POA: Diagnosis not present

## 2015-09-17 DIAGNOSIS — Z950 Presence of cardiac pacemaker: Secondary | ICD-10-CM | POA: Diagnosis not present

## 2015-09-17 LAB — URINALYSIS, ROUTINE W REFLEX MICROSCOPIC
Bilirubin Urine: NEGATIVE
Glucose, UA: NEGATIVE mg/dL
Hgb urine dipstick: NEGATIVE
Ketones, ur: NEGATIVE mg/dL
Nitrite: NEGATIVE
Protein, ur: NEGATIVE mg/dL
Specific Gravity, Urine: 1.013 (ref 1.005–1.030)
pH: 6.5 (ref 5.0–8.0)

## 2015-09-17 LAB — URINE MICROSCOPIC-ADD ON

## 2015-09-17 LAB — COMPREHENSIVE METABOLIC PANEL
ALT: 16 U/L (ref 14–54)
AST: 26 U/L (ref 15–41)
Albumin: 3.8 g/dL (ref 3.5–5.0)
Alkaline Phosphatase: 60 U/L (ref 38–126)
Anion gap: 5 (ref 5–15)
BUN: 6 mg/dL (ref 6–20)
CO2: 26 mmol/L (ref 22–32)
Calcium: 9.3 mg/dL (ref 8.9–10.3)
Chloride: 107 mmol/L (ref 101–111)
Creatinine, Ser: 1 mg/dL (ref 0.44–1.00)
GFR calc Af Amer: 57 mL/min — ABNORMAL LOW (ref >60)
GFR calc non Af Amer: 49 mL/min — ABNORMAL LOW (ref >60)
Glucose, Bld: 103 mg/dL — ABNORMAL HIGH (ref 65–99)
Potassium: 4.2 mmol/L (ref 3.5–5.1)
Sodium: 138 mmol/L (ref 135–145)
Total Bilirubin: 0.5 mg/dL (ref 0.3–1.2)
Total Protein: 6.9 g/dL (ref 6.5–8.1)

## 2015-09-17 LAB — CBC
HCT: 44.5 % (ref 36.0–46.0)
Hemoglobin: 14.5 g/dL (ref 12.0–15.0)
MCH: 31.7 pg (ref 26.0–34.0)
MCHC: 32.6 g/dL (ref 30.0–36.0)
MCV: 97.2 fL (ref 78.0–100.0)
Platelets: 241 10*3/uL (ref 150–400)
RBC: 4.58 MIL/uL (ref 3.87–5.11)
RDW: 13.1 % (ref 11.5–15.5)
WBC: 8.2 10*3/uL (ref 4.0–10.5)

## 2015-09-17 MED ORDER — METRONIDAZOLE 500 MG PO TABS: 500.0000 mg | ORAL_TABLET | Freq: Three times a day (TID) | ORAL | 0 refills | Status: AC

## 2015-09-17 MED ORDER — CIPROFLOXACIN HCL 500 MG PO TABS
500.0000 mg | ORAL_TABLET | Freq: Once | ORAL | Status: AC
  Administered 2015-09-17: 500 mg via ORAL
  Filled 2015-09-17: qty 1

## 2015-09-17 MED ORDER — METRONIDAZOLE 500 MG PO TABS
500.0000 mg | ORAL_TABLET | Freq: Once | ORAL | Status: AC
  Administered 2015-09-17: 500 mg via ORAL
  Filled 2015-09-17: qty 1

## 2015-09-17 MED ORDER — CIPROFLOXACIN HCL 500 MG PO TABS: 500.0000 mg | ORAL_TABLET | Freq: Two times a day (BID) | ORAL | 0 refills | Status: AC

## 2015-09-17 MED ORDER — IOPAMIDOL (ISOVUE-300) INJECTION 61%
INTRAVENOUS | Status: AC
  Administered 2015-09-17: 85 mL
  Filled 2015-09-17: qty 100

## 2015-09-17 NOTE — ED Triage Notes (Signed)
Pt c/o 1 month history of diarrhea. Her PCP placed her on abx for the diarrhea 2 weeks ago but it has not improved. She reports abd pain, nausea. She denies fevers, vomiting. She is alert and breathing easily

## 2015-09-17 NOTE — Discharge Instructions (Signed)
Read the information below.   You have a UTI. Imaging of your abdomen did not show any acute abnormalities. You do have diverticulosis and I have provided information below diverticulosis.  You are being prescribed antibiotics for your UTI and diarrhea. Please take as directed. Please discontinue if you develop a rash or difficulty breathing.  Stay hydrated.  Use the prescribed medication as directed.  Please discuss all new medications with your pharmacist.   Be sure to call and schedule a follow up appointment with your primary doctor.  You may return to the Emergency Department at any time for worsening condition or any new symptoms that concern you. Return to ED if you develop fever, constant abdominal pain, inability to keep food/fluids down, or blood in stool.

## 2015-09-17 NOTE — ED Notes (Signed)
Patient transported to CT 

## 2015-09-17 NOTE — ED Notes (Signed)
Patient returned from CT

## 2015-09-17 NOTE — ED Notes (Signed)
Patient unable to give urine sample at this time

## 2015-09-17 NOTE — ED Provider Notes (Signed)
[facility]-EMERGENCY DEPT Provider Note   CSN: [id number] Arrival date & time: 09/17/15  1501     History   Chief Complaint Chief Complaint  Patient presents with  . Diarrhea    HPI Deborah Chung is a 82 y.o. female.  Deborah Chung is a 82 y.o. female with history of atrial fibrillation, systolic CHF, dyslipidemia, hypertension, and anxiety since to ED with complaint of diarrhea. She states she has been experiencing diarrhea since August 3. She tried home treatments with Pepto-Bismol and imodium with minimal relief. She was seen by her primary care provider in August, a. Panel was completed and was only remarkable for positive lactose and. She was placed on Xifaxan for 2 weeks and recently completed the course this past Wednesday. She reports no improvement in her diarrhea. She describes the diarrhea as a "milkshake" consistency that is brown in color. She states that it was "seeping out" without her realizing. No blood noted. She endorses associated nausea, abdominal "soreness," intermittent lightheadedness, and trouble swallowing. She denies fever, chills, sweats, shortness of breath, chest pain, changes in vision, dysuria, hematuria. No recent travel outside the [place]. She does live in assisted living facility, no known sick contacts. No history of abdominal conditions. She has had an abdominal hysterectomy, no other abdominal surgeries.    Past Medical History:  Diagnosis Date  . Atrial fibrillation (HCC)   . CHF (congestive heart failure) (HCC)   . Dyslipidemia   . Essential hypertension, benign   . GAD (generalized anxiety disorder)   . Hearing loss   . Hyperlipidemia   . Hypertension   . PHN (postherpetic neuralgia) 2010  . PHN (postherpetic neuralgia)   . Systolic CHF [facility])     Patient Active Problem List   Diagnosis Date Noted  . Bacterial overgrowth syndrome 08/31/2015  . Dyslipidemia 08/21/2015  . Non-ischemic cardiomyopathy (HCC) 08/21/2015  . Hyperlipidemia with target  LDL less than 100 05/30/2015  . Allergic rhinitis due to pollen 05/30/2015  . Gastroesophageal reflux disease without esophagitis 05/30/2015  . Hyperglycemia 05/30/2015  . Routine general medical examination at a health care facility 05/30/2015  . Pacemaker 08/03/2013  . Atrial fibrillation (HCC) 06/29/2013  . Essential hypertension, benign 06/29/2013  . Hypertriglyceridemia 06/29/2013  . GAD (generalized anxiety disorder) 06/29/2013  . PHN (postherpetic neuralgia) 06/29/2013    Past Surgical History:  Procedure Laterality Date  . ABDOMINAL HYSTERECTOMY    . PACEMAKER PLACEMENT      OB History    No data available       Home Medications    Prior to Admission medications   Medication Sig Start Date End Date Taking? Authorizing Provider  ALPRAZolam Prudy Feeler) 0.25 MG tablet TAKE 1 TABLET BY MOUTH EVERY DAY AS NEEDED FOR ANXIETY 05/30/15   Etta Grandchild, MD  apixaban (ELIQUIS) 2.5 MG TABS tablet Take 1 tablet (2.5 mg total) by mouth 2 (two) times daily. 06/09/15   Etta Grandchild, MD  aspirin 81 MG tablet Take 81 mg by mouth daily.    Historical Provider, MD  azelastine (ASTELIN) 0.1 % nasal spray Place 1 spray into both nostrils 2 (two) times daily. Use in each nostril as directed 05/30/15   Etta Grandchild, MD  Biotin 10 MG TABS Take 1 tablet by mouth daily.    Historical Provider, MD  Cetirizine HCl 10 MG CAPS Take 1 capsule (10 mg total) by mouth daily. 05/30/15   Etta Grandchild, MD  ciprofloxacin (CIPRO) 500 MG tablet Take 1 tablet (500  mg total) by mouth 2 (two) times daily. 09/17/15 09/24/15  Lona Kettle, PA-C  LYRICA 75 MG capsule TAKE ONE CAPSULE BY MOUTH 3 TIMES A DAY 07/10/15   Etta Grandchild, MD  metoprolol tartrate (LOPRESSOR) 25 MG tablet TAKE 1 TABLET BY MOUTH TWICE A DAY 04/03/15   Etta Grandchild, MD  metroNIDAZOLE (FLAGYL) 500 MG tablet Take 1 tablet (500 mg total) by mouth 3 (three) times daily. 09/17/15 09/24/15  Lona Kettle, PA-C  pantoprazole (PROTONIX) 40 MG  tablet TAKE 1 TABLET BY MOUTH EVERY DAY 06/14/15   Etta Grandchild, MD  pravastatin (PRAVACHOL) 20 MG tablet TAKE 1 TABLET BY MOUTH EVERY DAY 07/07/15   Etta Grandchild, MD  ranitidine (ZANTAC) 300 MG tablet Take 1 tablet (300 mg total) by mouth at bedtime. 05/30/15   Etta Grandchild, MD  spironolactone (ALDACTONE) 25 MG tablet Take 1 tablet (25 mg total) by mouth daily. 06/26/15   Etta Grandchild, MD    Family History History reviewed. No pertinent family history.  Social History Social History  Substance Use Topics  . Smoking status: Former Smoker    Years: 25.00    Quit date: 08/03/1988  . Smokeless tobacco: Never Used  . Alcohol use No     Allergies   Review of patient's allergies indicates no known allergies.   Review of Systems Review of Systems  Constitutional: Negative for chills, diaphoresis and fever.  HENT: Positive for trouble swallowing.   Eyes: Negative for visual disturbance.  Respiratory: Negative for shortness of breath.   Cardiovascular: Negative for chest pain.  Gastrointestinal: Positive for diarrhea and nausea. Negative for abdominal pain, blood in stool, constipation and vomiting.  Genitourinary: Negative for dysuria and hematuria.  Musculoskeletal: Negative for back pain.  Skin: Negative for rash.  Neurological: Negative for syncope, weakness, numbness and headaches.     Physical Exam Updated Vital Signs BP 120/55   Pulse 70   Temp 97.6 F (36.4 C) (Oral)   Resp 16   Ht 5\' 5"  (1.651 m)   Wt 77.2 kg   SpO2 97%   BMI 28.31 kg/m   Physical Exam  Constitutional: She appears well-developed and well-nourished. No distress.  HENT:  Head: Normocephalic and atraumatic.  Mouth/Throat: Oropharynx is clear and moist. No oropharyngeal exudate.  Eyes: Conjunctivae and EOM are normal. Right eye exhibits no discharge. Left eye exhibits no discharge. No scleral icterus.  Neck: Normal range of motion and phonation normal. Neck supple. No neck rigidity. Normal  range of motion present.  Cardiovascular: Normal rate, regular rhythm, normal heart sounds and intact distal pulses.   No murmur heard. Pulmonary/Chest: Effort normal and breath sounds normal. No stridor. No respiratory distress. She has no wheezes. She has no rales.  Abdominal: Soft. Bowel sounds are normal. She exhibits no distension. There is tenderness ( mild) in the suprapubic area. There is no rigidity, no rebound, no guarding and no CVA tenderness.  Musculoskeletal: Normal range of motion.  Lymphadenopathy:    She has no cervical adenopathy.  Neurological: She is alert. She is not disoriented. Coordination and gait normal. GCS eye subscore is 4. GCS verbal subscore is 5. GCS motor subscore is 6.  Skin: Skin is warm and dry. She is not diaphoretic.  Psychiatric: She has a normal mood and affect. Her behavior is normal.     ED Treatments / Results  Labs (all labs ordered are listed, but only abnormal results are displayed) Labs Reviewed  COMPREHENSIVE  [id number] Auske Corin[address] [phone]wn [id number] 14-0 TTAG>BADTEXTTAG>1259Kirke 59mCal[facility]347-5 709Kirke Corin[address] 09.6Urban GibsonCaroll Rancher 28.4 [phone] HospitalRise [phone] Surgery [facility] [phone] [facility] MonroeRise MuChelsea Aus4mKernersville [facility]-Er166063016854-517-9557Kirke Corin[address] 09.6Urban GibsonCaroll Rancher 28.4 [phone] CenterRise [phone] Aus63mG[id number] Corin[address] 09.6Urban GibsonCaroll Rancher 28.4 [phone] [facility]ukes Hospital Of BethlehemRise [phone] Aus83mVa [id number] Corin[address] 09.6Urban GibsonCaroll Rancher 28.4 [phone] CenterRise [phone] Aus84mScripps Green [phone] Corin[address] 09.6Urban GibsonCaroll Rancher  scant tenderness in the suprapubic region. Recent stool labs completed by PCP remarkable for positive lactoferrin, otherwise negative. Patient placed on 2 week course of xifaxan with no relief. Will check labs. CT abdomen and pelvis to rule out intra-abdominal pathology.  U/A concerning for UTI, patient had TTP of suprapubic region. CT abdomen/pelvis shows no acute intraabdominal/pelvic pathology; CBD at upper limit of nml (nml LFT and bili), evidence of diverticulosis w/o diverticulitis, no obstruction or perforation. Given chronicity of sxs and age will tx for possible infectious etiology with Ciprofloxacin and metronidazole, ABX will also cover for UTI. Discussed results and plan with patient. Rx ciprofloxacin and metronidazole. Encouraged hydration. Follow up with PCP next week for re-evaluation. Return precautions provided. Patient voiced understanding and is agreeable.    Final Clinical Impressions(s) / ED Diagnoses   Final diagnoses:  Diarrhea, unspecified type  Urinary tract infection without hematuria, site unspecified    New Prescriptions New Prescriptions   CIPROFLOXACIN (CIPRO) 500 MG TABLET    Take 1  tablet (500 mg total) by mouth 2 (two) times daily.   METRONIDAZOLE (FLAGYL) 500 MG TABLET    Take 1 tablet (500 mg total) by mouth 3 (three) times daily.     Lona Kettleshley Laurel Jahmeer Porche, [place] JerseyPA-C 09/17/15 2129    Shaune Pollackameron Isaacs, MD 09/18/15 [phone]

## 2015-09-27 ENCOUNTER — Ambulatory Visit (INDEPENDENT_AMBULATORY_CARE_PROVIDER_SITE_OTHER): Payer: Medicare Other | Admitting: Internal Medicine

## 2015-09-27 ENCOUNTER — Other Ambulatory Visit: Payer: Medicare Other

## 2015-09-27 ENCOUNTER — Encounter: Payer: Self-pay | Admitting: Internal Medicine

## 2015-09-27 VITALS — BP 120/80 | HR 70 | Temp 98.1°F | Ht 65.5 in | Wt 169.8 lb

## 2015-09-27 DIAGNOSIS — R197 Diarrhea, unspecified: Secondary | ICD-10-CM | POA: Diagnosis not present

## 2015-09-27 DIAGNOSIS — K6389 Other specified diseases of intestine: Secondary | ICD-10-CM | POA: Diagnosis not present

## 2015-09-27 DIAGNOSIS — K219 Gastro-esophageal reflux disease without esophagitis: Secondary | ICD-10-CM | POA: Diagnosis not present

## 2015-09-27 DIAGNOSIS — R634 Abnormal weight loss: Secondary | ICD-10-CM | POA: Diagnosis not present

## 2015-09-27 MED ORDER — PANCRELIPASE (LIP-PROT-AMYL) 12000-38000 UNITS PO CPEP: 12000.0000 [IU] | ORAL_CAPSULE | Freq: Three times a day (TID) | ORAL | 1 refills | Status: DC

## 2015-09-27 NOTE — Assessment & Plan Note (Signed)
Clinically not improved with xifaxan, ok to hold further at this time

## 2015-09-27 NOTE — Progress Notes (Signed)
Pre visit review using our clinic review tool, if applicable. No additional management support is needed unless otherwise documented below in the visit note. 

## 2015-09-27 NOTE — Assessment & Plan Note (Addendum)
Doubt infectious as presumed previously, pt afeb, wbc normal, exam benign, stools non blood; for tx as above  Note:  Total time for pt hx, exam, review of record with pt in the room, determination of diagnoses and plan for further eval and tx is > 40 min, with over 50% spent in coordination and counseling of patient

## 2015-09-27 NOTE — Assessment & Plan Note (Signed)
Asked pt to hold on PPI for the next wk to see if affects the bowels, though has been taking this successfully for some time prior to onset of symptoms

## 2015-09-27 NOTE — Assessment & Plan Note (Signed)
Only 5 lbs but unusual it seems in conjunction with the diarrhea, but son is planning to try ensure (I warned of worsening diarrhea); agree with Dr Yetta Barre for celiac panel;  Cant r/o malabsorption - son asks for med trial - will try creon asd

## 2015-09-27 NOTE — Progress Notes (Signed)
Subjective:    Patient ID: Deborah Chung, female    DOB: June 24, 1926, 82 y.o.   MRN: [id number]  HPI  Here with 6 wks of ongoing persistent diarrhea, had been 3 loose BM per day, maybe not quite so often recently often once per day, no fever, pain or blood;  No n/v, but has lost 5 lbs recently, food just seem to go through her, despite a bland diet.  Appetite seems to wax and wane.  Has tried peptobismol and immodium  Has PPI on her list, not sure if taking but probably is per pt if on her list.  Did try xifaxan without help with symptoms in the long run, though may have improved for a day or two.  Taking probiotic daily.  Wt Readings from Last 3 Encounters:  09/27/15 169 lb 12.8 oz (77 kg)  09/17/15 170 lb 2 oz (77.2 kg)  09/11/15 174 lb (78.9 kg)   then seen at ED sept 3 with ? Diverticulitis vs UTI, but no better and no worse with cipro/flagyl.   Past Medical History:  Diagnosis Date  . Atrial fibrillation (HCC)   . CHF (congestive heart failure) (HCC)   . Dyslipidemia   . Essential hypertension, benign   . GAD (generalized anxiety disorder)   . Hearing loss   . Hyperlipidemia   . Hypertension   . PHN (postherpetic neuralgia) 2010  . PHN (postherpetic neuralgia)   . Systolic CHF [facility])    Past Surgical History:  Procedure Laterality Date  . ABDOMINAL HYSTERECTOMY    . PACEMAKER PLACEMENT      reports that she quit smoking about 27 years ago. She quit after 25.00 years of use. She has never used smokeless tobacco. She reports that she does not drink alcohol or use drugs. family history is not on file. No Known Allergies Current Outpatient Prescriptions on File Prior to Visit  Medication Sig Dispense Refill  . ALPRAZolam (XANAX) 0.25 MG tablet TAKE 1 TABLET BY MOUTH EVERY DAY AS NEEDED FOR ANXIETY 30 tablet 5  . apixaban (ELIQUIS) 2.5 MG TABS tablet Take 1 tablet (2.5 mg total) by mouth 2 (two) times daily. 180 tablet 1  . aspirin 81 MG tablet Take 81 mg by mouth daily.    Marland Kitchen  azelastine (ASTELIN) 0.1 % nasal spray Place 1 spray into both nostrils 2 (two) times daily. Use in each nostril as directed 30 mL 12  . Biotin 10 MG TABS Take 1 tablet by mouth daily.    . Cetirizine HCl 10 MG CAPS Take 1 capsule (10 mg total) by mouth daily. 30 capsule 11  . LYRICA 75 MG capsule TAKE ONE CAPSULE BY MOUTH 3 TIMES A DAY 270 capsule 1  . metoprolol tartrate (LOPRESSOR) 25 MG tablet TAKE 1 TABLET BY MOUTH TWICE A DAY 180 tablet 1  . pantoprazole (PROTONIX) 40 MG tablet TAKE 1 TABLET BY MOUTH EVERY DAY 90 tablet 3  . pravastatin (PRAVACHOL) 20 MG tablet TAKE 1 TABLET BY MOUTH EVERY DAY 90 tablet 3  . ranitidine (ZANTAC) 300 MG tablet Take 1 tablet (300 mg total) by mouth at bedtime. 90 tablet 3  . spironolactone (ALDACTONE) 25 MG tablet Take 1 tablet (25 mg total) by mouth daily. 90 tablet 1   No current facility-administered medications on file prior to visit.    Review of Systems  Constitutional: Negative for unusual diaphoresis or night sweats HENT: Negative for ear swelling or discharge Eyes: Negative for worsening visual haziness  Respiratory:  Negative for choking and stridor.   Gastrointestinal: Negative for distension or worsening eructation Genitourinary: Negative for retention or change in urine volume.  Musculoskeletal: Negative for other MSK pain or swelling Skin: Negative for color change and worsening wound Neurological: Negative for tremors and numbness other than noted  Psychiatric/Behavioral: Negative for decreased concentration or agitation other than above       Objective:   Physical Exam BP 120/80   Pulse 70   Temp 98.1 F (36.7 C) (Oral)   Ht 5' 5.5" (1.664 m)   Wt 169 lb 12.8 oz (77 kg)   BMI 27.83 kg/m  VS noted, not ill appearing Constitutional: Pt appears in no apparent distress HENT: Head: NCAT.  Right Ear: External ear normal.  Left Ear: External ear normal.  Eyes: . Pupils are equal, round, and reactive to light. Conjunctivae and EOM  are normal Neck: Normal range of motion. Neck supple.  Cardiovascular: Normal rate and regular rhythm.   Pulmonary/Chest: Effort normal and breath sounds without rales or wheezing.  Abd:  Soft, NT, ND, + BS Neurological: Pt is alert. Not confused , motor grossly intact Skin: Skin is warm. No rash, no LE edema Psychiatric: Pt behavior is normal. No agitation.   Lab Results  Component Value Date   WBC 8.2 09/17/2015   HGB 14.5 09/17/2015   HCT 44.5 09/17/2015   PLT 241 09/17/2015   GLUCOSE 103 (H) 09/17/2015   CHOL 192 02/22/2015   TRIG 223 (H) 02/22/2015   HDL 41 (L) 02/22/2015   LDLDIRECT 106.0 05/16/2014   LDLCALC 106 02/22/2015   ALT 16 09/17/2015   AST 26 09/17/2015   NA 138 09/17/2015   K 4.2 09/17/2015   CL 107 09/17/2015   CREATININE 1.00 09/17/2015   BUN 6 09/17/2015   CO2 26 09/17/2015   TSH 1.61 05/30/2015   HGBA1C 5.6 05/30/2015    CT ABDOMEN AND PELVIS WITH CONTRAST - 09-17-15 IMPRESSION: No acute intra-abdominal or pelvic pathology. No evidence of bowel obstruction or active inflammation.  Sigmoid diverticulosis.     Assessment & Plan:

## 2015-09-27 NOTE — Patient Instructions (Addendum)
Please take all new medication as prescribed  - the creon with meals  OK to hold on taking the Protonix medication for at least one wk  Please continue all other medications as before, and refills have been done if requested.  Please have the pharmacy call with any other refills you may need.  You will be contacted regarding the referral for: GI  Please keep your appointments with your specialists as you may have planned  Please go to the LAB in the Basement (turn left off the elevator) for the tests to be done today - the celiac panel ordered per Dr Yetta Barre  Please remember to sign up for MyChart if you have not done so, as this will be important to you in the future with finding out test results, communicating by private email, and scheduling acute appointments online when needed.

## 2015-09-28 LAB — GLIADIN ANTIBODIES, SERUM
Gliadin IgA: 5 Units (ref <20)
Gliadin IgG: 5 Units (ref <20)

## 2015-09-28 LAB — TISSUE TRANSGLUTAMINASE, IGA: Tissue Transglutaminase Ab, IgA: 1 U/mL (ref <4)

## 2015-09-30 LAB — RETICULIN ANTIBODIES, IGA W TITER: Reticulin Ab, IgA: NEGATIVE

## 2015-10-02 ENCOUNTER — Encounter: Payer: Self-pay | Admitting: Gastroenterology

## 2015-10-02 ENCOUNTER — Other Ambulatory Visit (INDEPENDENT_AMBULATORY_CARE_PROVIDER_SITE_OTHER): Payer: Medicare Other

## 2015-10-02 ENCOUNTER — Ambulatory Visit (INDEPENDENT_AMBULATORY_CARE_PROVIDER_SITE_OTHER): Payer: Medicare Other | Admitting: Gastroenterology

## 2015-10-02 VITALS — BP 110/70 | HR 76 | Ht 64.25 in | Wt 167.5 lb

## 2015-10-02 DIAGNOSIS — R634 Abnormal weight loss: Secondary | ICD-10-CM | POA: Diagnosis not present

## 2015-10-02 DIAGNOSIS — R109 Unspecified abdominal pain: Secondary | ICD-10-CM

## 2015-10-02 DIAGNOSIS — R197 Diarrhea, unspecified: Secondary | ICD-10-CM

## 2015-10-02 LAB — COMPREHENSIVE METABOLIC PANEL WITH GFR
ALT: 16 U/L (ref 0–35)
AST: 21 U/L (ref 0–37)
Albumin: 3.9 g/dL (ref 3.5–5.2)
Alkaline Phosphatase: 62 U/L (ref 39–117)
BUN: 8 mg/dL (ref 6–23)
CO2: 32 meq/L (ref 19–32)
Calcium: 9 mg/dL (ref 8.4–10.5)
Chloride: 101 meq/L (ref 96–112)
Creatinine, Ser: 0.93 mg/dL (ref 0.40–1.20)
GFR: 60.56 mL/min
Glucose, Bld: 91 mg/dL (ref 70–99)
Potassium: 5.1 meq/L (ref 3.5–5.1)
Sodium: 138 meq/L (ref 135–145)
Total Bilirubin: 0.6 mg/dL (ref 0.2–1.2)
Total Protein: 6.7 g/dL (ref 6.0–8.3)

## 2015-10-02 NOTE — Patient Instructions (Addendum)
You will have labs checked today in the basement lab.  Please head down after you check out with the front desk  (cmet). You should be taking 2 of the creon pills with every meal and 1 with every snack. Please return to see Dr. Christella Hartigan in 6-8 weeks, sooner if needed.

## 2015-10-02 NOTE — Progress Notes (Signed)
HPI: This is a  very pleasant 82 year old woman  who was referred to me by Corwin LevinsJohn, James W, MD  to evaluate  diarrhea, weight loss .    Here with her son today.  Chief complaint is diarrhea, weight loss  Started august 2nd with diarrhea. She has lost 8-10 pounds since then.   She started creon this past weekend; one pill with every meal 13K.  Already noticed a signficant improvement.  But not complete improvement  Never had pancreatic issues in past  None in family.  Never alcoholic  Has lost 8-10 pounds  The past 6-8 weeks laboratory work shows celiac sprue testing negative, GI pathogen panel was negative, fecal lactoferrin was positive, CBC and complete metabolic profile were both normal.   Cut from Ct scan reading 09/2015: "The liver is otherwise unremarkable with there is no calcified gallstone or pericholecystic fluid. The common bile duct is within upper limits of normal and measures 9 mm in diameter. No CBD stone identified.  Scattered coarse pancreatic calcifications sequela of chronic pancreatitis."   Review of systems: Pertinent positive and negative review of systems were noted in the above HPI section. Complete review of systems was performed and was otherwise normal.   Past Medical History:  Diagnosis Date  . Atrial fibrillation (HCC)   . GAD (generalized anxiety disorder)   . Hearing loss   . Hyperlipidemia   . Hypertension   . PHN (postherpetic neuralgia) 2010  . Systolic CHF [facility](HCC)     Past Surgical History:  Procedure Laterality Date  . ABDOMINAL HYSTERECTOMY    . PACEMAKER PLACEMENT    . TOTAL KNEE ARTHROPLASTY Bilateral     Current Outpatient Prescriptions  Medication Sig Dispense Refill  . ALPRAZolam (XANAX) 0.25 MG tablet TAKE 1 TABLET BY MOUTH EVERY DAY AS NEEDED FOR ANXIETY 30 tablet 5  . apixaban (ELIQUIS) 2.5 MG TABS tablet Take 1 tablet (2.5 mg total) by mouth 2 (two) times daily. 180 tablet 1  . aspirin 81 MG tablet Take 81 mg by mouth  daily.    Marland Kitchen. azelastine (ASTELIN) 0.1 % nasal spray Place 1 spray into both nostrils 2 (two) times daily. Use in each nostril as directed 30 mL 12  . Biotin 10 MG TABS Take 1 tablet by mouth daily.    . DiphenhydrAMINE HCl (BENADRYL ALLERGY PO) Take 1 tablet by mouth as needed.    . lipase/protease/amylase (CREON) 12000 units CPEP capsule Take 1 capsule (12,000 Units total) by mouth 3 (three) times daily before meals. 270 capsule 1  . LYRICA 75 MG capsule TAKE ONE CAPSULE BY MOUTH 3 TIMES A DAY 270 capsule 1  . metoprolol tartrate (LOPRESSOR) 25 MG tablet TAKE 1 TABLET BY MOUTH TWICE A DAY 180 tablet 1  . pravastatin (PRAVACHOL) 20 MG tablet TAKE 1 TABLET BY MOUTH EVERY DAY 90 tablet 3  . Probiotic Product (PROBIOTIC PO) Take 1 tablet by mouth daily.    . ranitidine (ZANTAC) 300 MG tablet Take 1 tablet (300 mg total) by mouth at bedtime. 90 tablet 3  . spironolactone (ALDACTONE) 25 MG tablet Take 1 tablet (25 mg total) by mouth daily. 90 tablet 1   No current facility-administered medications for this visit.     Allergies as of 10/02/2015  . (No Known Allergies)    Family History  Problem Relation Age of Onset  . Heart disease Father   . Heart disease Sister     Social History   Social History  . Marital  status: Unknown    Spouse name: N/A  . Number of children: 3  . Years of education: N/A   Occupational History  . retired    Social History Main Topics  . Smoking status: Former Smoker    Years: 25.00    Quit date: 08/03/1988  . Smokeless tobacco: Never Used  . Alcohol use Yes  . Drug use: No  . Sexual activity: Not Currently   Other Topics Concern  . Not on file   Social History Narrative  . No narrative on file     Physical Exam: Ht 5' 4.25" (1.632 m) Comment: height measured without shoes  Wt 167 lb 8 oz (76 kg)   BMI 28.53 kg/m  Constitutional: generally well-appearing Psychiatric: alert and oriented x3 Eyes: extraocular movements intact Mouth: oral  pharynx moist, no lesions Neck: supple no lymphadenopathy Cardiovascular: heart regular rate and rhythm Lungs: clear to auscultation bilaterally Abdomen: soft, nontender, nondistended, no obvious ascites, no peritoneal signs, normal bowel sounds Extremities: no lower extremity edema bilaterally Skin: no lesions on visible extremities   Assessment and plan: 82 y.o. female with  diarrhea weight loss, abnormal pancreas on recent CAT scan  Her CAT scan suggests chronic pancreatitis with several scattered calcifications throughout the pancreas. No obvious etiology for this. She has also noticed significant improvement since starting Creon one pill once a meal. I explained to her that I think that probably she does have chronic pancreatitis, unclear etiology. Her creatinine dose is not sufficient and so I recommended she double it so that she is taking 2 pills with every meal and 1 pill with every snack. We will feel any refills if needed. Her CT scan did suggest a slightly dilated bile duct at 9 mm. There are no obvious masses in her pancreas. She does understand that pancreatic neoplasm is another possible etiology here. At the age of 63 she would not be offered pancreatic surgery. I explained to her that I think the best option for now is to simply treat what is the most likely problem follow her closely. She is going to have a basic set of labs today including a CBC, complete metabolic profile. She'll return to see me in 6-8 weeks in the office. She does call here sooner if her diarrhea gets worse again or if she is continuing to lose weight despite the higher dose of Creon. If my suspicion for underlying pancreatic neoplasm rises that I think she'll need further testing (repeat cross-sectional imaging versus endoscopic ultrasound).  Rob Bunting, MD [facility] 10/02/2015, 2:36 PM  Cc: Corwin Levins, MD

## 2015-10-09 ENCOUNTER — Other Ambulatory Visit: Payer: Self-pay | Admitting: Gastroenterology

## 2015-10-09 MED ORDER — PANCRELIPASE (LIP-PROT-AMYL) 12000-38000 UNITS PO CPEP: 12000.0000 [IU] | ORAL_CAPSULE | Freq: Three times a day (TID) | ORAL | 3 refills | Status: DC

## 2015-10-09 NOTE — Telephone Encounter (Signed)
rx refill sent

## 2015-10-11 ENCOUNTER — Telehealth: Payer: Self-pay | Admitting: Gastroenterology

## 2015-10-11 NOTE — Telephone Encounter (Signed)
See alternate note  

## 2015-10-12 NOTE — Telephone Encounter (Signed)
Per Dr Christella Hartigan last office note prescription should be 2 pills with every meal and 1 pill with every snack.  Pharmacy was called and correct prescription given.  Pt notified

## 2015-10-23 ENCOUNTER — Telehealth: Payer: Self-pay | Admitting: Internal Medicine

## 2015-10-23 ENCOUNTER — Other Ambulatory Visit: Payer: Self-pay | Admitting: Internal Medicine

## 2015-10-23 MED ORDER — NITROFURANTOIN MONOHYD MACRO 100 MG PO CAPS: 100.0000 mg | ORAL_CAPSULE | Freq: Two times a day (BID) | ORAL | 1 refills | Status: AC

## 2015-10-23 NOTE — Telephone Encounter (Signed)
Pt called in and said that she thinks she has a UTI and want to know if Dr Yetta Barre will call something in to her Pharmacy no file for her without coming in?

## 2015-10-23 NOTE — Telephone Encounter (Signed)
Please advise 

## 2015-11-22 ENCOUNTER — Other Ambulatory Visit: Payer: Self-pay

## 2015-11-24 ENCOUNTER — Encounter: Payer: Self-pay | Admitting: Internal Medicine

## 2015-11-24 ENCOUNTER — Ambulatory Visit (INDEPENDENT_AMBULATORY_CARE_PROVIDER_SITE_OTHER): Payer: Medicare Other | Admitting: Internal Medicine

## 2015-11-24 VITALS — BP 124/68 | HR 70 | Ht 64.5 in | Wt 169.0 lb

## 2015-11-24 DIAGNOSIS — Z950 Presence of cardiac pacemaker: Secondary | ICD-10-CM

## 2015-11-24 DIAGNOSIS — I480 Paroxysmal atrial fibrillation: Secondary | ICD-10-CM

## 2015-11-24 NOTE — Patient Instructions (Addendum)
Medication Instructions:  Your physician recommends that you continue on your current medications as directed. Please refer to the Current Medication list given to you today.   Labwork: None Ordered   Testing/Procedures: None Ordered   Follow-Up: Your physician wants you to follow-up in: 1 year with Dr. Taylor.  You will receive a reminder letter in the mail two months in advance. If you don't receive a letter, please call our office to schedule the follow-up appointment.  Remote monitoring is used to monitor your Pacemaker from home. This monitoring reduces the number of office visits required to check your device to one time per year. It allows us to keep an eye on the functioning of your device to ensure it is working properly. You are scheduled for a device check from home on 02/26/16. You may send your transmission at any time that day. If you have a wireless device, the transmission will be sent automatically. After your physician reviews your transmission, you will receive a postcard with your next transmission date.    Any Other Special Instructions Will Be Listed Below (If Applicable).     If you need a refill on your cardiac medications before your next appointment, please call your pharmacy.   

## 2015-11-24 NOTE — Progress Notes (Signed)
HPI Mrs. Douthitt returns today for ongoing evaluation and management of her DDD PM. 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 She underwent insertion of a [employer] DDD PM in [place] several years ago. She has moved to [facility] to be closer to family. Her son [name] is our hospital counsel. She developed shingles approx. 5 years ago and is bothered by residual neuralgia. She has a h/o atrial fibrillation and is anti-coagulated. She remains active and has very little if any limitation despite her advanced age except for pain in her leg when she walks. It resolves immediately.  No Known Allergies   Current Outpatient Prescriptions  Medication Sig Dispense Refill  . ALPRAZolam (XANAX) 0.25 MG tablet TAKE 1 TABLET BY MOUTH EVERY DAY AS NEEDED FOR ANXIETY 30 tablet 5  . apixaban (ELIQUIS) 2.5 MG TABS tablet Take 1 tablet (2.5 mg total) by mouth 2 (two) times daily. 180 tablet 1  . aspirin 81 MG tablet Take 81 mg by mouth daily.    Marland Kitchen azelastine (ASTELIN) 0.1 % nasal spray Place 1 spray into both nostrils 2 (two) times daily. Use in each nostril as directed 30 mL 12  . Biotin 10 MG TABS Take 1 tablet by mouth daily.    . DiphenhydrAMINE HCl (BENADRYL ALLERGY PO) Take 1 tablet by mouth as needed.    . lipase/protease/amylase (CREON) 12000 units CPEP capsule Take 2 capsules (24,000 Units total) by mouth 3 (three) times daily before meals. 540 capsule 0  . LYRICA 75 MG capsule TAKE ONE CAPSULE BY MOUTH 3 TIMES A DAY 270 capsule 1  . metoprolol tartrate (LOPRESSOR) 25 MG tablet TAKE 1 TABLET BY MOUTH TWICE A DAY 180 tablet 1  . pravastatin (PRAVACHOL) 20 MG tablet TAKE 1 TABLET BY MOUTH EVERY DAY 90 tablet 3  . Probiotic Product (PROBIOTIC PO) Take 1 tablet by mouth daily.    . ranitidine (ZANTAC) 300 MG tablet Take 1 tablet (300 mg total) by mouth at bedtime. 90 tablet 3  . spironolactone (ALDACTONE) 25 MG tablet Take 1 tablet (25 mg total) by mouth daily.  90 tablet 1   No current facility-administered medications for this visit.      Past Medical History:  Diagnosis Date  . Atrial fibrillation (HCC)   . GAD (generalized anxiety disorder)   . Hearing loss   . Hyperlipidemia   . Hypertension   . PHN (postherpetic neuralgia) 2010  . Systolic CHF (HCC)     ROS:   All systems reviewed and negative except as noted in the HPI.   Past Surgical History:  Procedure Laterality Date  . ABDOMINAL HYSTERECTOMY    . PACEMAKER PLACEMENT    . TOTAL KNEE ARTHROPLASTY Bilateral      Family History  Problem Relation Age of Onset  . Heart disease Father   . Heart disease Sister      Social History   Social History  . Marital status: Unknown    Spouse name: N/A  . Number of children: 3  . Years of education: N/A   Occupational History  . retired    Social History Main Topics  . Smoking status: Former Smoker    Years: 25.00    Quit date: 08/03/1988  . Smokeless tobacco: Never Used  . Alcohol use Yes  . Drug use: No  . Sexual activity: Not Currently   Other Topics Concern  . Not on file   Social History  Narrative  . No narrative on file     BP 124/68   Pulse 70   Ht 5' 4.5" (1.638 m)   Wt 169 lb (76.7 kg)   BMI 28.56 kg/m   Physical Exam:  Well appearing elderly woman, NAD HEENT: Unremarkable Neck:  6 cm JVD, no thyromegally Back:  No CVA tenderness Lungs:  Clear with no wheezes HEART:  Regular rate rhythm, no murmurs, no rubs, no clicks Abd:  soft, positive bowel sounds, no organomegally, no rebound, no guarding Ext:  2 plus pulses, no edema, no cyanosis, no clubbing Skin:  No rashes no nodules Neuro:  CN II through XII intact, motor grossly intact   DEVICE  Normal device function.  See PaceArt for details.   Assess/Plan: 1. Sinus node dysfunction - she is stable, s/p PPM insertion. 2. PPM - her [employer] DDD PM has been reprogrammed today to give her a little more rate response. 3. PAF - she has  maintained NSR. Continue her current meds. 4. Leg pain - based on her symptoms and good pulses, this not claudication.  Leonia ReevesGregg Taylor,M.D.

## 2015-11-28 ENCOUNTER — Ambulatory Visit (INDEPENDENT_AMBULATORY_CARE_PROVIDER_SITE_OTHER): Payer: Medicare Other | Admitting: Gastroenterology

## 2015-11-28 ENCOUNTER — Encounter: Payer: Self-pay | Admitting: Gastroenterology

## 2015-11-28 VITALS — BP 116/56 | HR 72 | Ht 64.25 in | Wt 171.0 lb

## 2015-11-28 DIAGNOSIS — K8689 Other specified diseases of pancreas: Secondary | ICD-10-CM

## 2015-11-28 LAB — CUP PACEART INCLINIC DEVICE CHECK
Battery Remaining Longevity: 65 mo
Date Time Interrogation Session: 20171114095520
Implantable Lead Implant Date: 20150112
Implantable Lead Implant Date: 20150112
Implantable Lead Location: 753859
Implantable Lead Location: 753860
Implantable Lead Model: 4135
Implantable Lead Model: 4136
Implantable Lead Serial Number: 29308444
Implantable Lead Serial Number: 29569556
Implantable Pulse Generator Implant Date: 20150112
Lead Channel Impedance Value: 569 Ohm
Lead Channel Impedance Value: 863 Ohm
Lead Channel Pacing Threshold Amplitude: 0.8 V
Lead Channel Pacing Threshold Amplitude: 1.3 V
Lead Channel Pacing Threshold Pulse Width: 0.5 ms
Lead Channel Pacing Threshold Pulse Width: 0.5 ms
Lead Channel Setting Pacing Amplitude: 1.1 V
Lead Channel Setting Pacing Amplitude: 2.4 V
Lead Channel Setting Pacing Pulse Width: 0.4 ms
Lead Channel Setting Sensing Sensitivity: 3 mV
Pulse Gen Serial Number: 391753

## 2015-11-28 MED ORDER — PANCRELIPASE (LIP-PROT-AMYL) 12000-38000 UNITS PO CPEP: 24000.0000 [IU] | ORAL_CAPSULE | Freq: Four times a day (QID) | ORAL | 3 refills | Status: DC

## 2015-11-28 NOTE — Patient Instructions (Signed)
New prescription for more creon (avg 8 pills per day).  This will give you a bit more for potential snacks throughout the day. Please return to see Dr. Christella Hartigan in 6 months.

## 2015-11-28 NOTE — Progress Notes (Signed)
Review of pertinent gastrointestinal problems: 1. Presumed pancreatic insufficiency from chronic pancreatitis.  CT scan 09/2015 "The liver is otherwise unremarkable with there is no calcified gallstone or pericholecystic fluid. The common bile duct is within upper limits of normal and measures 9 mm in diameter. No CBD stone identified.  Scattered coarse pancreatic calcifications sequela of chronic pancreatitis."  Labs 2017: shows celiac sprue testing negative, GI pathogen panel was negative, fecal lactoferrin was positive, CBC and complete metabolic profile were both normal.     HPI: This is a  very +[id number] year old woman whom I last saw 2 months ago. She is here with her son again today Chief complaint is improved chronic loose stools and abdominal pains.  She started increased dose of pancreatic enzyme supplements and since then she has noticed a significant improvement in her daily bowel habits.  Diarrhea much better usually but not always.    Weight is up 4 pounds since last OV here 2 months ago.  ROS: complete GI ROS as described in HPI.  Constitutional:  No unintentional weight loss   Past Medical History:  Diagnosis Date  . Atrial fibrillation (HCC)   . GAD (generalized anxiety disorder)   . Hearing loss   . Hyperlipidemia   . Hypertension   . PHN (postherpetic neuralgia) 2010  . Systolic CHF [facility](HCC)     Past Surgical History:  Procedure Laterality Date  . ABDOMINAL HYSTERECTOMY    . PACEMAKER PLACEMENT    . TOTAL KNEE ARTHROPLASTY Bilateral     Current Outpatient Prescriptions  Medication Sig Dispense Refill  . ALPRAZolam (XANAX) 0.25 MG tablet TAKE 1 TABLET BY MOUTH EVERY DAY AS NEEDED FOR ANXIETY 30 tablet 5  . apixaban (ELIQUIS) 2.5 MG TABS tablet Take 1 tablet (2.5 mg total) by mouth 2 (two) times daily. 180 tablet 1  . aspirin 81 MG tablet Take 81 mg by mouth daily.    Marland Kitchen. azelastine (ASTELIN) 0.1 % nasal spray Place 1 spray into both nostrils 2 (two) times daily. Use  in each nostril as directed 30 mL 12  . Biotin 10 MG TABS Take 1 tablet by mouth daily.    . DiphenhydrAMINE HCl (BENADRYL ALLERGY PO) Take 1 tablet by mouth as needed.    . lipase/protease/amylase (CREON) 12000 units CPEP capsule Take 2 capsules (24,000 Units total) by mouth 3 (three) times daily before meals. 540 capsule 0  . LYRICA 75 MG capsule TAKE ONE CAPSULE BY MOUTH 3 TIMES A DAY 270 capsule 1  . metoprolol tartrate (LOPRESSOR) 25 MG tablet TAKE 1 TABLET BY MOUTH TWICE A DAY 180 tablet 1  . pravastatin (PRAVACHOL) 20 MG tablet TAKE 1 TABLET BY MOUTH EVERY DAY 90 tablet 3  . Probiotic Product (PROBIOTIC PO) Take 1 tablet by mouth daily.    . ranitidine (ZANTAC) 300 MG tablet Take 1 tablet (300 mg total) by mouth at bedtime. 90 tablet 3  . spironolactone (ALDACTONE) 25 MG tablet Take 1 tablet (25 mg total) by mouth daily. 90 tablet 1   No current facility-administered medications for this visit.     Allergies as of 11/28/2015  . (No Known Allergies)    Family History  Problem Relation Age of Onset  . Heart disease Father   . Heart disease Sister     Social History   Social History  . Marital status: Unknown    Spouse name: N/A  . Number of children: 3  . Years of education: N/A   Occupational History  .  retired    Social History Main Topics  . Smoking status: Former Smoker    Years: 25.00    Quit date: 08/03/1988  . Smokeless tobacco: Never Used  . Alcohol use Yes  . Drug use: No  . Sexual activity: Not Currently   Other Topics Concern  . Not on file   Social History Narrative  . No narrative on file     Physical Exam: BP (!) 116/56   Pulse 72   Ht 5' 4.25" (1.632 m)   Wt 171 lb (77.6 kg)   BMI 29.12 kg/m  Constitutional: generally well-appearing Psychiatric: alert and oriented x3 Abdomen: soft, nontender, nondistended, no obvious ascites, no peritoneal signs, normal bowel sounds No peripheral edema noted in lower extremities  Assessment and  plan: 82 y.o. female with Presumed pancreatic insufficiency based on multiple scattered calcifications throughout her pancreas on 2017 CT scan, clinical response to enzyme supplements she will continue on pancreatic enzyme supplements. She asked for more of these on a daily basis so that she can indeed eat snacks without running out of the pills. I'm happy to do that. She will take on average 8 pills per day this allows 2 with every meal and 2 snacks throughout the day. She will return to see me in 4-6 months and sooner if needed.     Rob Bunting, MD [facility] 11/28/2015, 1:57 PM

## 2015-12-09 ENCOUNTER — Other Ambulatory Visit: Payer: Self-pay | Admitting: Internal Medicine

## 2015-12-11 ENCOUNTER — Other Ambulatory Visit: Payer: Self-pay | Admitting: Internal Medicine

## 2015-12-28 ENCOUNTER — Other Ambulatory Visit: Payer: Self-pay | Admitting: Internal Medicine

## 2016-01-01 ENCOUNTER — Other Ambulatory Visit: Payer: Self-pay | Admitting: Internal Medicine

## 2016-01-02 ENCOUNTER — Other Ambulatory Visit: Payer: Self-pay | Admitting: Internal Medicine

## 2016-01-02 DIAGNOSIS — F411 Generalized anxiety disorder: Secondary | ICD-10-CM

## 2016-01-03 NOTE — Telephone Encounter (Signed)
Rx faxed to [facility] 

## 2016-01-11 ENCOUNTER — Other Ambulatory Visit: Payer: Self-pay | Admitting: Internal Medicine

## 2016-01-13 ENCOUNTER — Other Ambulatory Visit: Payer: Self-pay | Admitting: Internal Medicine

## 2016-01-14 ENCOUNTER — Other Ambulatory Visit: Payer: Self-pay | Admitting: Internal Medicine

## 2016-01-15 ENCOUNTER — Other Ambulatory Visit: Payer: Self-pay | Admitting: Internal Medicine

## 2016-01-16 NOTE — Telephone Encounter (Signed)
[facility]

## 2016-02-26 ENCOUNTER — Ambulatory Visit (INDEPENDENT_AMBULATORY_CARE_PROVIDER_SITE_OTHER): Payer: Medicare Other | Admitting: *Deleted

## 2016-02-26 ENCOUNTER — Telehealth: Payer: Self-pay | Admitting: Cardiology

## 2016-02-26 DIAGNOSIS — I428 Other cardiomyopathies: Secondary | ICD-10-CM | POA: Diagnosis not present

## 2016-02-26 NOTE — Telephone Encounter (Signed)
LMOVM reminding pt to send remote transmission.   

## 2016-02-29 ENCOUNTER — Encounter: Payer: Self-pay | Admitting: Cardiology

## 2016-02-29 NOTE — Progress Notes (Signed)
Remote pacemaker transmission.   

## 2016-03-01 LAB — CUP PACEART REMOTE DEVICE CHECK
Battery Remaining Longevity: 66 mo
Battery Remaining Percentage: 100 %
Brady Statistic RA Percent Paced: 92 %
Brady Statistic RV Percent Paced: 2 %
Date Time Interrogation Session: 20180214085100
Implantable Lead Implant Date: 20150112
Implantable Lead Implant Date: 20150112
Implantable Lead Location: 753859
Implantable Lead Location: 753860
Implantable Lead Model: 4135
Implantable Lead Model: 4136
Implantable Lead Serial Number: 29308444
Implantable Lead Serial Number: 29569556
Implantable Pulse Generator Implant Date: 20150112
Lead Channel Impedance Value: 565 Ohm
Lead Channel Impedance Value: 819 Ohm
Lead Channel Pacing Threshold Amplitude: 0.7 V
Lead Channel Pacing Threshold Amplitude: 0.8 V
Lead Channel Pacing Threshold Pulse Width: 0.4 ms
Lead Channel Pacing Threshold Pulse Width: 0.5 ms
Lead Channel Setting Pacing Amplitude: 1.2 V
Lead Channel Setting Pacing Amplitude: 2.4 V
Lead Channel Setting Pacing Pulse Width: 0.4 ms
Lead Channel Setting Sensing Sensitivity: 3 mV
Pulse Gen Serial Number: 391753

## 2016-03-05 ENCOUNTER — Encounter: Payer: Self-pay | Admitting: Internal Medicine

## 2016-03-05 ENCOUNTER — Ambulatory Visit (INDEPENDENT_AMBULATORY_CARE_PROVIDER_SITE_OTHER): Payer: Medicare Other | Admitting: Internal Medicine

## 2016-03-05 VITALS — BP 118/62 | HR 70 | Ht 64.5 in | Wt 173.0 lb

## 2016-03-05 DIAGNOSIS — Z950 Presence of cardiac pacemaker: Secondary | ICD-10-CM | POA: Diagnosis not present

## 2016-03-05 DIAGNOSIS — E785 Hyperlipidemia, unspecified: Secondary | ICD-10-CM | POA: Diagnosis not present

## 2016-03-05 DIAGNOSIS — I428 Other cardiomyopathies: Secondary | ICD-10-CM

## 2016-03-05 DIAGNOSIS — I10 Essential (primary) hypertension: Secondary | ICD-10-CM | POA: Diagnosis not present

## 2016-03-05 NOTE — Patient Instructions (Signed)
Your physician wants you to follow-up in: 6 months with Dr. Rennis Golden. You will receive a reminder letter in the mail two months in advance. If you don't receive a letter, please call our office to schedule the follow-up appointment.  [facility] Locations: [address] [place], [place] [address] ([address] and [address])  Phone: [phone] Fax: [phone]  Wednesday - Thursday 8:00 a.m. - 5:00 p.m.  [address] [place], [place] [address] [facility].)  Phone: [phone] Fax: [phone]  Monday - Friday 8:00 a.m. - 5:00 p.m

## 2016-03-05 NOTE — Progress Notes (Signed)
OFFICE NOTE  Chief Complaint:  No complaints  Primary Care Physician: Sanda Linger, MD  HPI:  Deborah Chung is a pleasant 83 year old female who recently moved to [place] from [place]. She was apparently living in [place], somewhere outside of [place]. Her son is Kallie Depolo, who is the in-house counsel for the [facility] system. She was previously seeing [facility] and had an episode in February of 2015. Apparently she had a mobile health screening and was found to be out of rhythm. She was sent to her primary care doctor who did an EKG and noted she was in A. fib and was sent immediately to the hospital. As the story goes, she was on several treatments for her A. fib and apparently eventually underwent cardiac catheterization and/or possibly an ablation procedure for which she developed asystole or some type of arrhythmia requiring her to be resuscitated. Obviously this was successful and she received apparently a pacemaker. She is not clear as to what the type of pacemaker wasn't did not provide documentation today. I've not yet received any records from her cardiologist in [place]. She was placed on Eliquis for anticoagulation. In addition, it appears she is on digoxin and Aldactone along with Lopressor which is suggestive of possible cardiomyopathy. She apparently does have a heart failure diagnosis however I'm not certain as to what her ejection fraction is. She reports a remote check device for her pacemaker and did a remote check apparently last week which went to [place]. Symptomatically she denies any chest pain or shortness of breath.  I saw Deborah Chung back today in the office. She is doing fairly well. She saw Dr. Lewayne Bunting in November who performed a pacemaker check and felt that she was doing well in sinus rhythm. There is no evidence for recurrent atrial fibrillation. She has yet to be enrolled in home monitoring. Twice she has  requested a home monitor but it has not been yet sent to her. I will need to look into why she has not received her home monitoring device. 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 Eyes a chest pain or worsening shortness of breath. As mentioned recent laboratory work from her primary shows excellent cholesterol control with total cholesterol 174, triglycerides 124, HDL 59 and LDL of 90.  At the pleasure seeing Deborah Chung back in the office today. She reports doing fairly well. She still has some issues with swelling in her legs and discomfort with achiness and heaviness. She's had bilateral knee surgeries and is noted to have some varicose veins as well as some discoloration over her ankles. From a cardiac standpoint she had an echo which shows preserved systolic function, this represents a marked improvement since her EF was reported around 10% at one point but this may been after cardiac arrest when she got her pacemaker placed. Pacemaker function appears to be normal. She's had little if any atrial fibrillation and is on Eliquis for a CHADSVASC score of 4.  I saw Deborah Chung back today in the office. Overall she is feeling well. She denies any worsening shortness of breath or swelling. EF is now normalized. She follows with Dr. Ladona Ridgel for pacemaker. She's had no bleeding problems on Eliquis. She is on low-dose digoxin which will require monitoring. She is overdue for cholesterol check.  08/21/2015  Deborah Chung returns today for follow-up. In the interim she had a pacer remote check with a stable burden of atrial fibrillation. She continues to  do well on Eliquis. We discontinued her digoxin and her last office visit for a borderline elevated digoxin level and the fact that it is really not indicated for A. fib and her congestive heart failure has improved with normal LV function. Blood pressure is well-controlled today. Her only other complaint is that she's had diarrhea for approximately 7 days.  There is no fever or chills associated with that. He came on abruptly. She denied any sick contacts. She is using Imodium for this.  03/05/2016  Deborah Chung returns today for follow-up. Overall she feels well. She had recent adjustment in her pacemaker when she saw Dr. Ladona Ridgel and is atrial paced today is 70. Blood pressure is normal 118/62. She denies any bleeding problems on L a course. She is not aware of her A. fib. Recently she had a bout with diarrhea was found to have a pancreatic insufficiency. She is on Creon.  PMHx:  Past Medical History:  Diagnosis Date  . Atrial fibrillation (HCC)   . GAD (generalized anxiety disorder)   . Hearing loss   . Hyperlipidemia   . Hypertension   . PHN (postherpetic neuralgia) 2010  . Systolic CHF [facility])     Past Surgical History:  Procedure Laterality Date  . ABDOMINAL HYSTERECTOMY    . PACEMAKER PLACEMENT    . TOTAL KNEE ARTHROPLASTY Bilateral     FAMHx:  Family History  Problem Relation Age of Onset  . Heart disease Father   . Heart disease Sister     SOCHx:   reports that she quit smoking about 27 years ago. She quit after 25.00 years of use. She has never used smokeless tobacco. She reports that she drinks alcohol. She reports that she does not use drugs.  ALLERGIES:  No Known Allergies  ROS: Pertinent items noted in HPI and remainder of comprehensive ROS otherwise negative.  HOME MEDS: Current Outpatient Prescriptions  Medication Sig Dispense Refill  . ALPRAZolam (XANAX) 0.25 MG tablet TAKE 1 TABLET EVERY DAY AS NEEDED FOR ANXIETY 30 tablet 3  . aspirin 81 MG tablet Take 81 mg by mouth daily.    Marland Kitchen azelastine (ASTELIN) 0.1 % nasal spray Place 1 spray into both nostrils 2 (two) times daily. Use in each nostril as directed 30 mL 12  . Biotin 10 MG TABS Take 1 tablet by mouth daily.    . DiphenhydrAMINE HCl (BENADRYL ALLERGY PO) Take 1 tablet by mouth as needed.    Marland Kitchen ELIQUIS 2.5 MG TABS tablet TAKE 1 TABLET (2.5 MG TOTAL) BY MOUTH  2 (TWO) TIMES DAILY. 180 tablet 1  . lipase/protease/amylase (CREON) 12000 units CPEP capsule Take 2 capsules (24,000 Units total) by mouth 4 (four) times daily. 720 capsule 3  . LYRICA 75 MG capsule TAKE ONE CAPSULE BY MOUTH 3 TIMES A DAY 270 capsule 1  . metoprolol tartrate (LOPRESSOR) 25 MG tablet TAKE 1 TABLET BY MOUTH TWICE A DAY 180 tablet 1  . pravastatin (PRAVACHOL) 20 MG tablet TAKE 1 TABLET BY MOUTH EVERY DAY 90 tablet 3  . Probiotic Product (PROBIOTIC PO) Take 1 tablet by mouth daily.    . ranitidine (ZANTAC) 300 MG tablet Take 1 tablet (300 mg total) by mouth at bedtime. 90 tablet 3  . spironolactone (ALDACTONE) 25 MG tablet TAKE 1 TABLET (25 MG TOTAL) BY MOUTH DAILY. 90 tablet 1   No current facility-administered medications for this visit.     LABS/IMAGING: No results found for this or any previous visit (from the past  48 hour(s)). No results found.  VITALS: BP 118/62   Pulse 70   Ht 5' 4.5" (1.638 m)   Wt 173 lb (78.5 kg)   BMI 29.24 kg/m   EXAM: General appearance: alert and no distress Neck: no carotid bruit and no JVD Lungs: clear to auscultation bilaterally and pacemaker in well-healed pocket in the left upper chest Heart: regular rate and rhythm, S1, S2 normal, no murmur, click, rub or gallop Abdomen: soft, non-tender; bowel sounds normal; no masses,  no organomegaly Extremities: extremities normal, atraumatic, no cyanosis or edema, varicose veins noted and Shiny skin without pitting edema Pulses: 2+ and symmetric Skin: Skin color, texture, turgor normal. No rashes or lesions Neurologic: Grossly normal Psych: NOrmal  EKG: Atrial paced rhythm at 70  ASSESSMENT: 1. Diarrhea - related to pancreatic insufficieny 2. History of atrial fibrillation on anticoagulation with Eliquis (CHADVASC 4) 3. Hypertension 4. Dyslipidemia 5. Status post pacemaker - for sinus node dysfunction and complete heart block 6. Reported history of EF of 10-15%, now improved to  55-60%  PLAN: 1.  Deborah Chung seems to be doing well with regards her pacemaker and A. fib. She denies any bleeding problems on L a course. Blood pressure is well controlled. Cholesterol is at goal. They she reported some difficulty walking with pain in her left foot. I thought she might benefit from custom orthotics and have referred her to the [facility]. Follow-up with me in 6 months or sooner as necessary.  Chrystie Nose, MD, [facility] Attending Cardiologist [facility]  Chrystie Nose 03/05/2016, 5:31 PM

## 2016-03-21 ENCOUNTER — Ambulatory Visit: Payer: Medicare Other | Admitting: Internal Medicine

## 2016-03-26 ENCOUNTER — Ambulatory Visit (INDEPENDENT_AMBULATORY_CARE_PROVIDER_SITE_OTHER): Payer: Medicare Other | Admitting: Internal Medicine

## 2016-03-26 ENCOUNTER — Encounter: Payer: Self-pay | Admitting: Internal Medicine

## 2016-03-26 ENCOUNTER — Other Ambulatory Visit: Payer: Self-pay | Admitting: Internal Medicine

## 2016-03-26 ENCOUNTER — Other Ambulatory Visit: Payer: Medicare Other

## 2016-03-26 VITALS — BP 124/62 | HR 70 | Temp 97.5°F | Wt 173.0 lb

## 2016-03-26 DIAGNOSIS — N909 Noninflammatory disorder of vulva and perineum, unspecified: Secondary | ICD-10-CM

## 2016-03-26 DIAGNOSIS — N764 Abscess of vulva: Secondary | ICD-10-CM | POA: Diagnosis not present

## 2016-03-26 DIAGNOSIS — N9089 Other specified noninflammatory disorders of vulva and perineum: Secondary | ICD-10-CM

## 2016-03-26 NOTE — Patient Instructions (Signed)
Incision and Drainage Incision and drainage is a surgical procedure to open and drain a fluid-filled sac. The sac may be filled with pus, mucus, or blood. Examples of fluid-filled sacs that may need surgical drainage include cysts, skin infections (abscesses), and red lumps that develop from a ruptured cyst or a small abscess (boils). You may need this procedure if the affected area is large, painful, infected, or not healing well. Tell a health care provider about:  Any allergies you have.  All medicines you are taking, including vitamins, herbs, eye drops, creams, and over-the-counter medicines.  Any problems you or family members have had with anesthetic medicines.  Any blood disorders you have.  Any surgeries you have had.  Any medical conditions you have.  Whether you are pregnant or may be pregnant. What are the risks? Generally, this is a safe procedure. However, problems may occur, including:  Infection.  Bleeding.  Allergic reactions to medicines.  Scarring.  What happens before the procedure?  You may need an ultrasound or other imaging tests to see how large or deep the fluid-filled sac is.  You may have blood tests to check for infection.  You may get a tetanus shot.  You may be given antibiotic medicine to help prevent infection.  Follow instructions from your health care provider about eating or drinking restrictions.  Ask your health care provider about: ? Changing or stopping your regular medicines. This is especially important if you are taking diabetes medicines or blood thinners. ? Taking medicines such as aspirin and ibuprofen. These medicines can thin your blood. Do not take these medicines before your procedure if your health care provider instructs you not to.  Plan to have someone take you home after the procedure.  If you will be going home right after the procedure, plan to have someone stay with you for 24 hours. What happens during the  procedure?  To reduce your risk of infection: ? Your health care team will wash or sanitize their hands. ? Your skin will be washed with soap.  You will be given one or more of the following: ? A medicine to help you relax (sedative). ? A medicine to numb the area (local anesthetic). ? A medicine to make you fall asleep (general anesthetic).  An incision will be made in the top of the fluid-filled sac.  The contents of the sac may be squeezed out, or a syringe or tube (catheter)may be used to empty the sac.  The catheter may be left in place for several weeks to drain any fluid. Or, your health care provider may stitch open the edges of the incision to make a long-term opening for drainage (marsupialization).  The inside of the sac may be washed out (irrigated) with a sterile solution and packed with gauze before it is covered with a bandage (dressing). The procedure may vary among health care providers and hospitals. What happens after the procedure?  Your blood pressure, heart rate, breathing rate, and blood oxygen level will be monitored often until the medicines you were given have worn off.  Do not drive for 24 hours if you received a sedative. This information is not intended to replace advice given to you by your health care provider. Make sure you discuss any questions you have with your health care provider. Document Released: 06/26/2000 Document Revised: 06/08/2015 Document Reviewed: 10/21/2014 Elsevier Interactive Patient Education  2017 [employer]  

## 2016-03-26 NOTE — Progress Notes (Signed)
Pre visit review using our clinic review tool, if applicable. No additional management support is needed unless otherwise documented below in the visit note. 

## 2016-03-26 NOTE — Progress Notes (Signed)
Subjective:  Patient ID: Deborah Chung, female    DOB: 1926-06-21  Age: 83 y.o. MRN: [id number]  CC: Recurrent Skin Infections   HPI Deborah Chung presents for a concern about a painful, bleeding, draining area over her left lower vaginal area for 10 days.  Outpatient Medications Prior to Visit  Medication Sig Dispense Refill  . ALPRAZolam (XANAX) 0.25 MG tablet TAKE 1 TABLET EVERY DAY AS NEEDED FOR ANXIETY 30 tablet 3  . aspirin 81 MG tablet Take 81 mg by mouth daily.    Marland Kitchen azelastine (ASTELIN) 0.1 % nasal spray Place 1 spray into both nostrils 2 (two) times daily. Use in each nostril as directed 30 mL 12  . Biotin 10 MG TABS Take 1 tablet by mouth daily.    . DiphenhydrAMINE HCl (BENADRYL ALLERGY PO) Take 1 tablet by mouth as needed.    Marland Kitchen ELIQUIS 2.5 MG TABS tablet TAKE 1 TABLET (2.5 MG TOTAL) BY MOUTH 2 (TWO) TIMES DAILY. 180 tablet 1  . lipase/protease/amylase (CREON) 12000 units CPEP capsule Take 2 capsules (24,000 Units total) by mouth 4 (four) times daily. 720 capsule 3  . LYRICA 75 MG capsule TAKE ONE CAPSULE BY MOUTH 3 TIMES A DAY 270 capsule 1  . metoprolol tartrate (LOPRESSOR) 25 MG tablet TAKE 1 TABLET BY MOUTH TWICE A DAY 180 tablet 1  . pravastatin (PRAVACHOL) 20 MG tablet TAKE 1 TABLET BY MOUTH EVERY DAY 90 tablet 3  . Probiotic Product (PROBIOTIC PO) Take 1 tablet by mouth daily.    . ranitidine (ZANTAC) 300 MG tablet Take 1 tablet (300 mg total) by mouth at bedtime. 90 tablet 3  . spironolactone (ALDACTONE) 25 MG tablet TAKE 1 TABLET (25 MG TOTAL) BY MOUTH DAILY. 90 tablet 1   No facility-administered medications prior to visit.     ROS Review of Systems  Constitutional: Negative for chills, fatigue and fever.  HENT: Negative.   Eyes: Negative.   Respiratory: Negative for cough, chest tightness, shortness of breath and wheezing.   Cardiovascular: Negative.  Negative for chest pain, palpitations and leg swelling.  Gastrointestinal: Negative for abdominal pain,  nausea and rectal pain.  Endocrine: Negative.   Genitourinary: Positive for vaginal pain. Negative for decreased urine volume, difficulty urinating, dysuria, flank pain, frequency, genital sores, hematuria, pelvic pain, vaginal bleeding and vaginal discharge.  Musculoskeletal: Negative for back pain and myalgias.  Skin: Negative.  Negative for color change and rash.  Allergic/Immunologic: Negative.   Neurological: Negative.   Hematological: Negative for adenopathy. Does not bruise/bleed easily.  Psychiatric/Behavioral: Negative.     Objective:  BP 124/62 (BP Location: Left Arm, Patient Position: Sitting)   Pulse 70   Temp 97.5 F (36.4 C) (Oral)   Wt 173 lb (78.5 kg)   SpO2 98%   BMI 29.24 kg/m   BP Readings from Last 3 Encounters:  03/26/16 124/62  03/05/16 118/62  11/28/15 (!) 116/56    Wt Readings from Last 3 Encounters:  03/26/16 173 lb (78.5 kg)  03/05/16 173 lb (78.5 kg)  11/28/15 171 lb (77.6 kg)    Physical Exam  Constitutional: No distress.  HENT:  Mouth/Throat: Oropharynx is clear and moist. No oropharyngeal exudate.  Eyes: Conjunctivae are normal. Right eye exhibits no discharge. Left eye exhibits no discharge. No scleral icterus.  Neck: Normal range of motion. Neck supple. No JVD present. No tracheal deviation present. No thyromegaly present.  Cardiovascular: Normal rate, regular rhythm, normal heart sounds and intact distal pulses.  Exam reveals no  gallop and no friction rub.   No murmur heard. Pulmonary/Chest: Effort normal and breath sounds normal. No stridor. No respiratory distress. She has no wheezes. She has no rales. She exhibits no tenderness.  Abdominal: Soft. Bowel sounds are normal. She exhibits no distension and no mass. There is no tenderness. There is no rebound and no guarding.  Genitourinary: There is labial fusion. There is no rash, tenderness, lesion or injury on the right labia. There is tenderness and lesion on the left labia. There is no  injury on the left labia.  Genitourinary Comments: Left lower labial area reveals a protruding mass ( 2x3 cm) with a scant amount of exudate. It is tender, slightly firm and slightly squishy, it is irregular but not fixed.  The area was cleaned with Betadine and then prepped and draped in sterile fashion. Local anesthesia was used with the instillation of 2% plain lidocaine. After anesthesia was obtained a 6 mm punch incision was made. There was no significant exudate. There was visible a cystic/mass type structure and a small specimen was obtained and sent for pathology, the specimen is white and pearly consistent with an inclusion cyst. The remainder of the mass was left intact. The opening was packed with iodoform. A dressing was applied. She tolerated it well.  Lymphadenopathy:    She has no cervical adenopathy.       Right: No inguinal adenopathy present.       Left: No inguinal adenopathy present.  Skin: She is not diaphoretic.  Vitals reviewed.   Lab Results  Component Value Date   WBC 8.2 09/17/2015   HGB 14.5 09/17/2015   HCT 44.5 09/17/2015   PLT 241 09/17/2015   GLUCOSE 91 10/02/2015   CHOL 192 02/22/2015   TRIG 223 (H) 02/22/2015   HDL 41 (L) 02/22/2015   LDLDIRECT 106.0 05/16/2014   LDLCALC 106 02/22/2015   ALT 16 10/02/2015   AST 21 10/02/2015   NA 138 10/02/2015   K 5.1 10/02/2015   CL 101 10/02/2015   CREATININE 0.93 10/02/2015   BUN 8 10/02/2015   CO2 32 10/02/2015   TSH 1.61 05/30/2015   HGBA1C 5.6 05/30/2015    Ct Abdomen Pelvis W Contrast  Result Date: 09/17/2015 CLINICAL DATA:  83 year old female with abdominal pain and diarrhea. EXAM: CT ABDOMEN AND PELVIS WITH CONTRAST TECHNIQUE: Multidetector CT imaging of the abdomen and pelvis was performed using the standard protocol following bolus administration of intravenous contrast. CONTRAST:  85mL ISOVUE-300 IOPAMIDOL (ISOVUE-300) INJECTION 61% COMPARISON:  None. FINDINGS: The visualized lung bases appear clear.  Cardiac pacemaker wires noted. No intra-abdominal free air or free fluid. Subcentimeter scattered hepatic hypodensities are too small to characterize but likely represent cysts or hemangioma. The liver is otherwise unremarkable with there is no calcified gallstone or pericholecystic fluid. The common bile duct is within upper limits of normal and measures 9 mm in diameter. No CBD stone identified. Scattered coarse pancreatic calcifications sequela of chronic pancreatitis. No acute inflammatory changes or pancreatic duct dilatation. The spleen, adrenal glands appear unremarkable. Mild bilateral renal atrophy. An 11 mm right renal interpolar all hypodensity most likely a cyst. There is no hydronephrosis on either side. The visualized ureters and urinary bladder appear unremarkable. Hysterectomy. There is sigmoid diverticulosis with muscular hypertrophy. No active inflammatory changes. There is no evidence of bowel obstruction or active inflammation. Normal appendix. There is moderate aortoiliac atherosclerotic disease. The origins of the celiac axis, SMA, IMA as well as the origins of the renal  arteries appear patent. There are atherosclerotic calcification of the origins of the celiac axis and renal arteries. The SMV, splenic vein, and main portal vein are patent. No portal venous gas identified. There is no adenopathy. The abdominal wall soft tissues appear unremarkable. There is osteopenia with degenerative changes of the spine. No acute fracture. Multilevel disc desiccation with vacuum phenomenon. IMPRESSION: No acute intra-abdominal or pelvic pathology. No evidence of bowel obstruction or active inflammation. Sigmoid diverticulosis. Electronically Signed   By: Elgie Collard M.D.   On: 09/17/2015 20:45    Assessment & Plan:   Delmar was seen today for recurrent skin infections.  Diagnoses and all orders for this visit:  Left genital labial abscess- Culture has been sent, at this time I don't see a  significant infection that needs to be treated. I await the results of the culture. She will return in 2 days for packing removal and recheck. -     WOUND CULTURE; Future  Vulvar mass- I await the results of the pathology specimen, this appears to be a benign/cystic lesion but there are some elements that are concerning for a more malignant process. I have therefore asked her to see gynecology for further evaluation. -     Ambulatory referral to Gynecology -     Dermatology pathology; Future   I am having Ms. Montez Morita maintain her aspirin, Biotin, metoprolol tartrate, azelastine, ranitidine, pravastatin, DiphenhydrAMINE HCl (BENADRYL ALLERGY PO), Probiotic Product (PROBIOTIC PO), lipase/protease/amylase, ELIQUIS, spironolactone, ALPRAZolam, and LYRICA.  No orders of the defined types were placed in this encounter.    Follow-up: Return in about 2 days (around 03/28/2016).  Sanda Linger, MD

## 2016-03-28 ENCOUNTER — Encounter: Payer: Self-pay | Admitting: Obstetrics and Gynecology

## 2016-03-28 ENCOUNTER — Ambulatory Visit (INDEPENDENT_AMBULATORY_CARE_PROVIDER_SITE_OTHER): Payer: Self-pay | Admitting: Internal Medicine

## 2016-03-28 ENCOUNTER — Ambulatory Visit (INDEPENDENT_AMBULATORY_CARE_PROVIDER_SITE_OTHER): Payer: Medicare Other | Admitting: Obstetrics and Gynecology

## 2016-03-28 ENCOUNTER — Encounter: Payer: Self-pay | Admitting: Internal Medicine

## 2016-03-28 ENCOUNTER — Telehealth: Payer: Self-pay | Admitting: Obstetrics and Gynecology

## 2016-03-28 VITALS — BP 128/68 | HR 72 | Temp 97.6°F

## 2016-03-28 VITALS — BP 124/60 | HR 72 | Resp 18 | Wt 173.0 lb

## 2016-03-28 DIAGNOSIS — N764 Abscess of vulva: Secondary | ICD-10-CM

## 2016-03-28 DIAGNOSIS — N816 Rectocele: Secondary | ICD-10-CM

## 2016-03-28 DIAGNOSIS — N909 Noninflammatory disorder of vulva and perineum, unspecified: Secondary | ICD-10-CM

## 2016-03-28 DIAGNOSIS — N9089 Other specified noninflammatory disorders of vulva and perineum: Secondary | ICD-10-CM

## 2016-03-28 NOTE — Patient Instructions (Signed)
Wound Check If you have a wound, it may take some time to heal. Eventually, a scar will form. The scar will also fade with time. It is important to take care of your wound while it is healing. This helps to protect your wound from infection. How should I take care of my wound at home?  Some wounds are allowed to close on their own or are repaired at a later date. There are many different ways to close and cover a wound, including stitches (sutures), skin glue, and adhesive strips. Follow your health care provider's instructions about:  Wound care.  Bandage (dressing) changes and removal.  Wound closure removal.  Take medicines only as directed by your health care provider.  Keep all follow-up visits as directed by your health care provider. This is important.  Do not take baths, swim, or use a hot tub until your health care provider approves. You may shower as directed by your health care provider.  Keep your wound clean and dry. What affects scar formation? Scars affect each person differently. How your body scars depends on:  The location and size of your wound.  Traits that you inherited from your parents (genetic predisposition).  How you take care of your wound. Irritation and inflammation increase the amount of scar formation.  Sun exposure. This can darken a scar. When should I call or see my health care provider? Call or see your health care provider if:  You have redness, swelling, or pain at your wound site.  You have fluid, blood, or pus coming from your wound.  You have muscle aches, chills, or a general ill feeling.  You notice a bad smell coming from the wound.  Your wound separates after the sutures, staples, or skin adhesive strips have been removed.  You have persistent nausea or vomiting.  You have a fever.  You are dizzy. When should I call 911 or go to the emergency room? Call 911 or go to the emergency room if:  You faint.  You have difficulty  breathing. This information is not intended to replace advice given to you by your health care provider. Make sure you discuss any questions you have with your health care provider. Document Released: 10/07/2003 Document Revised: 06/14/2015 Document Reviewed: 10/12/2013 Elsevier Interactive Patient Education  2017 [employer]  

## 2016-03-28 NOTE — Telephone Encounter (Signed)
Called and left a message for patient to call back to schedule a new patient doctor referral. °

## 2016-03-28 NOTE — Patient Instructions (Signed)
About Rectocele  Overview  A rectocele is a type of hernia which causes different degrees of bulging of the rectal tissues into the vaginal wall.  You may even notice that it presses against the vaginal wall so much that some vaginal tissues droop outside of the opening of your vagina.  Causes of Rectocele  The most common cause is childbirth.  The muscles and ligaments in the pelvis that hold up and support the female organs and vagina become stretched and weakened during labor and delivery.  The more babies you have, the more the support tissues are stretched and weakened.  Not everyone who has a baby will develop a rectocele.  Some women have stronger supporting tissue in the pelvis and may not have as much of a problem as others.  Women who have a Cesarean section usually do not get rectocele's unless they pushed a long time prior to the cesarean delivery.  Other conditions that can cause a rectocele include chronic constipation, a chronic cough, a lot of heavy lifting, and obesity.  Older women may have this problem because the loss of female hormones causes the vaginal tissue to become weaker.  Symptoms  There may not be any symptoms.  If you do have symptoms, they may include:  Pelvic pressure in the rectal area  Protrusion of the lower part of the vagina through the opening of the vagina  Constipation and trapping of the stool, making it difficult to have a bowel movement.  In severe cases, you may have to press on the lower part of your vagina to help push the stool out of you rectum.  This is called splinting to empty.  Diagnosing Rectocele  Your health care provider will ask about your symptoms and perform a pelvic exam.  S/he will ask you to bear down, pushing like you are having a bowel movement so as to see how far the lower part of the vagina protrudes into the vagina and possible outside of the vagina.  Your provider will also ask you to contract the muscles of your pelvis  (like you are stopping the stream in the middle of urinating) to determine the strength of your pelvic muscles.  Your provider may also do a rectal exam.  Treatment Options  If you do not have any symptoms, no treatment may be necessary.  Other treatment options include:  Pelvic floor exercises: Contracting the muscles in your genital area may help strengthen your muscles and support the organs.  Be sure to get proper exercise instruction from you physical therapist.  A pessary (removealbe pelvic support device) sometimes helps rectocele symptoms.  Surgery: Surgical repair may be necessary. In some cases the uterus may need to be taken out ( a hysterectomy) as well.  There are many types of surgery for pelvic support problems.  Look for physicians who specialize in repair procedures.  You can take care of yourself by:  Treating and preventing constipation  Avoiding heavy lifting, and lifting correctly (with your legs, not with you waist or back)  Treating a chronic cough or bronchitis  Not smoking  avoiding too much weight gain  Doing pelvic floor exercises   2007, [employer] Doc.33Skin Abscess A skin abscess is an infected area on or under your skin that contains a collection of pus and other material. An abscess may also be called a furuncle, carbuncle, or boil. An abscess can occur in or on almost any part of your body. Some abscesses break open (rupture)  on their own. Most continue to get worse unless they are treated. The infection can spread deeper into the body and eventually into your blood, which can make you feel ill. Treatment usually involves draining the abscess. What are the causes? An abscess occurs when germs, often bacteria, pass through your skin and cause an infection. This may be caused by:  A scrape or cut on your skin.  A puncture wound through your skin, including a needle injection.  Blocked oil or sweat glands.  Blocked and infected hair  follicles.  A cyst that forms beneath your skin (sebaceous cyst) and becomes infected. What increases the risk? This condition is more likely to develop in people who:  Have a weak body defense system (immune system).  Have diabetes.  Have dry and irritated skin.  Get frequent injections or use illegal IV drugs.  Have a foreign body in a wound, such as a splinter.  Have problems with their lymph system or veins. What are the signs or symptoms? An abscess may start as a painful, firm bump under the skin. Over time, the abscess may get larger or become softer. Pus may appear at the top of the abscess, causing pressure and pain. It may eventually break through the skin and drain. Other symptoms include:  Redness.  Warmth.  Swelling.  Tenderness.  A sore on the skin. How is this diagnosed? This condition is diagnosed based on your medical history and a physical exam. A sample of pus may be taken from the abscess to find out what is causing the infection and what antibiotics can be used to treat it. You also may have:  Blood tests to look for signs of infection or spread of an infection to your blood.  Imaging studies such as ultrasound, CT scan, or MRI if the abscess is deep. How is this treated? Small abscesses that drain on their own may not need treatment. Treatment for an abscess that does not rupture on its own may include:  Warm compresses applied to the area several times per day.  Incision and drainage. Your health care provider will make an incision to open the abscess and will remove pus and any foreign body or dead tissue. The incision area may be packed with gauze to keep it open for a few days while it heals.  Antibiotic medicines to treat infection. For a severe abscess, you may first get antibiotics through an IV and then change to oral antibiotics. Follow these instructions at home: Abscess Care   If you have an abscess that has not drained, place a warm,  clean, wet washcloth over the abscess several times a day. Do this as told by your health care provider.  Follow instructions from your health care provider about how to take care of your abscess. Make sure you:  Cover the abscess with a bandage (dressing).  Change your dressing or gauze as told by your health care provider.  Wash your hands with soap and water before you change the dressing or gauze. If soap and water are not available, use hand sanitizer.  Check your abscess every day for signs of a worsening infection. Check for:  More redness, swelling, or pain.  More fluid or blood.  Warmth.  More pus or a bad smell. Medicines   Take over-the-counter and prescription medicines only as told by your health care provider.  If you were prescribed an antibiotic medicine, take it as told by your health care provider. Do not stop  taking the antibiotic even if you start to feel better. General instructions   To avoid spreading the infection:  Do not share personal care items, towels, or hot tubs with others.  Avoid making skin contact with other people.  Keep all follow-up visits as told by your health care provider. This is important. Contact a health care provider if:  You have more redness, swelling, or pain around your abscess.  You have more fluid or blood coming from your abscess.  Your abscess feels warm to the touch.  You have more pus or a bad smell coming from your abscess.  You have a fever.  You have muscle aches.  You have chills or a general ill feeling. Get help right away if:  You have severe pain.  You see red streaks on your skin spreading away from the abscess. This information is not intended to replace advice given to you by your health care provider. Make sure you discuss any questions you have with your health care provider. Document Released: 10/10/2004 Document Revised: 08/27/2015 Document Reviewed: 11/09/2014 Elsevier Interactive Patient  Education  2017 [employer].

## 2016-03-28 NOTE — Progress Notes (Signed)
83 y.o. [id number] UnknownCaucasianF here referred by PCP forlabial abscess.   The patient first noticed a lump (pimple) on her left vulva a few weeks ago. About 6 days ago she noticed that it had grown and gotten tender. She did notice some drainage on Monday. She is feeling better since the biopsy. No fevers.  Other wise feeling well.   Dr Yetta Barre saw her 2 days ago and did a biopsy of the area.   Biopsy Diagnosis Vulva, biopsy, mass - NECROTIC AND DEGENERATING MATERIAL. SEE COMMENT. Microscopic Comment There is a suggestion that this material may be squamous epithelial cells. Definitive diagnosis is not possible, but I cannot exclude the presence of a mass, which may be squamous in origin. Rebiopsy is recommended as clinically indicated, as this material is not sufficient for diagnosis. (MJ:ecj 03/27/2016)    No LMP recorded. Patient is postmenopausal.          Sexually active: No.  The current method of family planning is status post hysterectomy.    Exercising: Yes.    exercise class Smoker:  no  Health Maintenance: Pap:  Years ago History of abnormal Pap:  no MMG:  At least 4 years ago  Colonoscopy:  Never BMD: Never   TDaP:  06-29-13 Gardasil: N/A   reports that she quit smoking about 27 years ago. She quit after 25.00 years of use. She has never used smokeless tobacco. She reports that she drinks alcohol. She reports that she does not use drugs.  Past Medical History:  Diagnosis Date  . Anxiety   . Atrial fibrillation (HCC)   . Blood transfusion without reported diagnosis   . GAD (generalized anxiety disorder)   . Hearing loss   . Hyperlipidemia   . Hypertension   . PHN (postherpetic neuralgia) 2010  . Systolic CHF [facility])     Past Surgical History:  Procedure Laterality Date  . ABDOMINAL HYSTERECTOMY    . PACEMAKER PLACEMENT    . TOTAL KNEE ARTHROPLASTY Bilateral     Current Outpatient Prescriptions  Medication Sig Dispense Refill  . ALPRAZolam (XANAX) 0.25 MG  tablet TAKE 1 TABLET EVERY DAY AS NEEDED FOR ANXIETY 30 tablet 3  . aspirin 81 MG tablet Take 81 mg by mouth daily.    Marland Kitchen azelastine (ASTELIN) 0.1 % nasal spray Place 1 spray into both nostrils 2 (two) times daily. Use in each nostril as directed 30 mL 12  . Biotin 10 MG TABS Take 1 tablet by mouth daily.    . DiphenhydrAMINE HCl (BENADRYL ALLERGY PO) Take 1 tablet by mouth as needed.    Marland Kitchen ELIQUIS 2.5 MG TABS tablet TAKE 1 TABLET (2.5 MG TOTAL) BY MOUTH 2 (TWO) TIMES DAILY. 180 tablet 1  . lipase/protease/amylase (CREON) 12000 units CPEP capsule Take 2 capsules (24,000 Units total) by mouth 4 (four) times daily. 720 capsule 3  . LYRICA 75 MG capsule TAKE ONE CAPSULE BY MOUTH 3 TIMES A DAY 270 capsule 1  . metoprolol tartrate (LOPRESSOR) 25 MG tablet TAKE 1 TABLET BY MOUTH TWICE A DAY 180 tablet 1  . pravastatin (PRAVACHOL) 20 MG tablet TAKE 1 TABLET BY MOUTH EVERY DAY 90 tablet 3  . Probiotic Product (PROBIOTIC PO) Take 1 tablet by mouth daily.    . ranitidine (ZANTAC) 300 MG tablet Take 1 tablet (300 mg total) by mouth at bedtime. 90 tablet 3  . spironolactone (ALDACTONE) 25 MG tablet TAKE 1 TABLET (25 MG TOTAL) BY MOUTH DAILY. 90 tablet 1   No  current facility-administered medications for this visit.     Family History  Problem Relation Age of Onset  . Heart disease Father   . Heart disease Sister     Review of Systems  Constitutional: Negative.   HENT: Negative.   Eyes: Negative.   Respiratory: Negative.   Cardiovascular: Negative.   Gastrointestinal: Negative.   Endocrine: Negative.   Genitourinary: Negative.        Labial abscess   Musculoskeletal: Negative.   Skin: Negative.   Allergic/Immunologic: Negative.   Neurological: Negative.   Psychiatric/Behavioral: Negative.     Exam:   BP 124/60 (BP Location: Right Arm, Patient Position: Sitting, Cuff Size: Normal)   Pulse 72   Resp 18   Wt 173 lb (78.5 kg)   BMI 29.24 kg/m   Weight change: @WEIGHTCHANGE @ Height:       Ht Readings from Last 3 Encounters:  03/05/16 5' 4.5" (1.638 m)  11/28/15 5' 4.25" (1.632 m)  11/24/15 5' 4.5" (1.638 m)    General appearance: alert, cooperative and appears stated age Abdomen: soft, non-tender; bowel sounds normal; no masses,  no organomegaly No abnormal inguinal nodes palpated   Pelvic: External genitalia: on the left lower labia majora there is a 1.5-2 cm, mildly tender mass. There is a well healing biopsy site, no active drainage. No erythema. Not fixed.               Urethra:  normal appearing urethra with no masses, tenderness or lesions              Bartholins and Skenes: normal                 Vagina: atrophic appearing vagina with a grade 2 rectocele (with valsalva)              Cervix: absent               Bimanual Exam:  Uterus:  uterus absent              Adnexa: no mass, fullness, tenderness               Rectovaginal: Confirms               Anus:  normal sphincter tone, no lesions  Chaperone was present for exam.  A:  Left labial mass, suspect healing vulvar abscess. The patient reports drainage with improvement in her symptoms. It is smaller than described 2 days ago. Not currently bothering her.   Rectocele, not symptomatic  P:   Return early next week, if not resolving would repeat the biopsy (first biopsy was non diagnostic)  Discussed that she has a rectocele, no treatment needed, not bothering her  CC: Dr Yetta Barre

## 2016-03-28 NOTE — Progress Notes (Signed)
Pre visit review using our clinic review tool, if applicable. No additional management support is needed unless otherwise documented below in the visit note. 

## 2016-03-29 LAB — WOUND CULTURE
Gram Stain: NONE SEEN
Gram Stain: NONE SEEN
Organism ID, Bacteria: NORMAL

## 2016-03-31 NOTE — Progress Notes (Signed)
Subjective:  Patient ID: Deborah Chung, female    DOB: 1926-10-06  Age: 83 y.o. MRN: [id number]  CC: packing removal   HPI Deborah Chung presents for a wound check - She tells me the packing fell out soon after I saw  Her 2 days ago. The area of incision and drainage is healing nicely with no pain, redness, swelling, or drainage. The culture is unremarkable and the biopsy specimen only reveals necrotic squamous cells. She has an appointment soon with GYN. She feels well today and offers no complaints.  Outpatient Medications Prior to Visit  Medication Sig Dispense Refill  . ALPRAZolam (XANAX) 0.25 MG tablet TAKE 1 TABLET EVERY DAY AS NEEDED FOR ANXIETY 30 tablet 3  . aspirin 81 MG tablet Take 81 mg by mouth daily.    Marland Kitchen azelastine (ASTELIN) 0.1 % nasal spray Place 1 spray into both nostrils 2 (two) times daily. Use in each nostril as directed 30 mL 12  . Biotin 10 MG TABS Take 1 tablet by mouth daily.    . DiphenhydrAMINE HCl (BENADRYL ALLERGY PO) Take 1 tablet by mouth as needed.    Marland Kitchen ELIQUIS 2.5 MG TABS tablet TAKE 1 TABLET (2.5 MG TOTAL) BY MOUTH 2 (TWO) TIMES DAILY. 180 tablet 1  . lipase/protease/amylase (CREON) 12000 units CPEP capsule Take 2 capsules (24,000 Units total) by mouth 4 (four) times daily. 720 capsule 3  . LYRICA 75 MG capsule TAKE ONE CAPSULE BY MOUTH 3 TIMES A DAY 270 capsule 1  . metoprolol tartrate (LOPRESSOR) 25 MG tablet TAKE 1 TABLET BY MOUTH TWICE A DAY 180 tablet 1  . pravastatin (PRAVACHOL) 20 MG tablet TAKE 1 TABLET BY MOUTH EVERY DAY 90 tablet 3  . Probiotic Product (PROBIOTIC PO) Take 1 tablet by mouth daily.    . ranitidine (ZANTAC) 300 MG tablet Take 1 tablet (300 mg total) by mouth at bedtime. 90 tablet 3  . spironolactone (ALDACTONE) 25 MG tablet TAKE 1 TABLET (25 MG TOTAL) BY MOUTH DAILY. 90 tablet 1   No facility-administered medications prior to visit.     ROS Review of Systems  Constitutional: Negative.  Negative for chills, fatigue and fever.    HENT: Negative.   Eyes: Negative for visual disturbance.  Respiratory: Negative.  Negative for cough, chest tightness, shortness of breath and stridor.   Cardiovascular: Negative for chest pain, palpitations and leg swelling.  Gastrointestinal: Negative for abdominal pain, constipation, diarrhea, nausea and vomiting.  Endocrine: Negative.   Genitourinary: Negative.  Negative for difficulty urinating.  Musculoskeletal: Negative.   Skin: Negative.   Allergic/Immunologic: Negative.   Neurological: Negative.  Negative for dizziness.  Hematological: Negative.  Negative for adenopathy. Does not bruise/bleed easily.  Psychiatric/Behavioral: Negative.     Objective:  BP 128/68 (BP Location: Left Arm, Patient Position: Sitting)   Pulse 72   Temp 97.6 F (36.4 C) (Oral)   SpO2 97%   BP Readings from Last 3 Encounters:  03/28/16 124/60  03/28/16 128/68  03/26/16 124/62    Wt Readings from Last 3 Encounters:  03/28/16 173 lb (78.5 kg)  03/26/16 173 lb (78.5 kg)  03/05/16 173 lb (78.5 kg)    Physical Exam  Genitourinary:    Pelvic exam was performed with patient supine. No erythema, tenderness or bleeding in the vagina. No foreign body in the vagina. No signs of injury around the vagina. No vaginal discharge found.    Lab Results  Component Value Date   WBC 8.2 09/17/2015   HGB  14.5 09/17/2015   HCT 44.5 09/17/2015   PLT 241 09/17/2015   GLUCOSE 91 10/02/2015   CHOL 192 02/22/2015   TRIG 223 (H) 02/22/2015   HDL 41 (L) 02/22/2015   LDLDIRECT 106.0 05/16/2014   LDLCALC 106 02/22/2015   ALT 16 10/02/2015   AST 21 10/02/2015   NA 138 10/02/2015   K 5.1 10/02/2015   CL 101 10/02/2015   CREATININE 0.93 10/02/2015   BUN 8 10/02/2015   CO2 32 10/02/2015   TSH 1.61 05/30/2015   HGBA1C 5.6 05/30/2015    Ct Abdomen Pelvis W Contrast  Result Date: 09/17/2015 CLINICAL DATA:  83 year old female with abdominal pain and diarrhea. EXAM: CT ABDOMEN AND PELVIS WITH CONTRAST  TECHNIQUE: Multidetector CT imaging of the abdomen and pelvis was performed using the standard protocol following bolus administration of intravenous contrast. CONTRAST:  85mL ISOVUE-300 IOPAMIDOL (ISOVUE-300) INJECTION 61% COMPARISON:  None. FINDINGS: The visualized lung bases appear clear. Cardiac pacemaker wires noted. No intra-abdominal free air or free fluid. Subcentimeter scattered hepatic hypodensities are too small to characterize but likely represent cysts or hemangioma. The liver is otherwise unremarkable with there is no calcified gallstone or pericholecystic fluid. The common bile duct is within upper limits of normal and measures 9 mm in diameter. No CBD stone identified. Scattered coarse pancreatic calcifications sequela of chronic pancreatitis. No acute inflammatory changes or pancreatic duct dilatation. The spleen, adrenal glands appear unremarkable. Mild bilateral renal atrophy. An 11 mm right renal interpolar all hypodensity most likely a cyst. There is no hydronephrosis on either side. The visualized ureters and urinary bladder appear unremarkable. Hysterectomy. There is sigmoid diverticulosis with muscular hypertrophy. No active inflammatory changes. There is no evidence of bowel obstruction or active inflammation. Normal appendix. There is moderate aortoiliac atherosclerotic disease. The origins of the celiac axis, SMA, IMA as well as the origins of the renal arteries appear patent. There are atherosclerotic calcification of the origins of the celiac axis and renal arteries. The SMV, splenic vein, and main portal vein are patent. No portal venous gas identified. There is no adenopathy. The abdominal wall soft tissues appear unremarkable. There is osteopenia with degenerative changes of the spine. No acute fracture. Multilevel disc desiccation with vacuum phenomenon. IMPRESSION: No acute intra-abdominal or pelvic pathology. No evidence of bowel obstruction or active inflammation. Sigmoid  diverticulosis. Electronically Signed   By: Elgie Collard M.D.   On: 09/17/2015 20:45    Assessment & Plan:   Deborah Chung was seen today for packing removal.  Diagnoses and all orders for this visit:  Vulvar mass- if there was a component of infection or abscess then it has resolved, there is still a benign soft mass in the area so she will see GYN to see if this needs to be biopsied.   I am having Ms. Montez Morita maintain her aspirin, Biotin, metoprolol tartrate, azelastine, ranitidine, pravastatin, DiphenhydrAMINE HCl (BENADRYL ALLERGY PO), Probiotic Product (PROBIOTIC PO), lipase/protease/amylase, ELIQUIS, spironolactone, ALPRAZolam, and LYRICA.  No orders of the defined types were placed in this encounter.    Follow-up: Return if symptoms worsen or fail to improve.  Sanda Linger, MD

## 2016-04-01 ENCOUNTER — Other Ambulatory Visit: Payer: Self-pay | Admitting: Internal Medicine

## 2016-04-01 DIAGNOSIS — F411 Generalized anxiety disorder: Secondary | ICD-10-CM

## 2016-04-01 NOTE — Telephone Encounter (Signed)
rx faxed to [facility].  

## 2016-04-02 ENCOUNTER — Ambulatory Visit (INDEPENDENT_AMBULATORY_CARE_PROVIDER_SITE_OTHER): Payer: Medicare Other | Admitting: Obstetrics and Gynecology

## 2016-04-02 ENCOUNTER — Encounter: Payer: Self-pay | Admitting: Obstetrics and Gynecology

## 2016-04-02 VITALS — BP 118/70 | HR 80 | Resp 16 | Wt 173.0 lb

## 2016-04-02 DIAGNOSIS — N764 Abscess of vulva: Secondary | ICD-10-CM

## 2016-04-02 NOTE — Progress Notes (Signed)
GYNECOLOGY  VISIT   HPI: 83 y.o.   Unknown  Caucasian  female   [id number] with No LMP recorded. Patient is postmenopausal.   here for recheck of a vulvar abscess, she is feeling fine, no longer tender, no further drainage. The abscess was drained by her primary MD last week. Biopsy with necrotic tissue, non diagnostic.   GYNECOLOGIC HISTORY: No LMP recorded. Patient is postmenopausal. Contraception:Postmenopausal Menopausal hormone therapy: none        OB History    Gravida Para Term Preterm AB Living   3 3 3     3    SAB TAB Ectopic Multiple Live Births           3         Patient Active Problem List   Diagnosis Date Noted  . Vulvar mass 03/26/2016  . Bacterial overgrowth syndrome 08/31/2015  . Dyslipidemia 08/21/2015  . Non-ischemic cardiomyopathy (HCC) 08/21/2015  . Hyperlipidemia with target LDL less than 100 05/30/2015  . Allergic rhinitis due to pollen 05/30/2015  . Gastroesophageal reflux disease without esophagitis 05/30/2015  . Hyperglycemia 05/30/2015  . Routine general medical examination at a health care facility 05/30/2015  . Pacemaker 08/03/2013  . Atrial fibrillation (HCC) 06/29/2013  . Essential hypertension, benign 06/29/2013  . Hypertriglyceridemia 06/29/2013  . GAD (generalized anxiety disorder) 06/29/2013  . PHN (postherpetic neuralgia) 06/29/2013    Past Medical History:  Diagnosis Date  . Anxiety   . Atrial fibrillation (HCC)   . Blood transfusion without reported diagnosis   . GAD (generalized anxiety disorder)   . Hearing loss   . Hyperlipidemia   . Hypertension   . PHN (postherpetic neuralgia) 2010  . Systolic CHF [facility])     Past Surgical History:  Procedure Laterality Date  . ABDOMINAL HYSTERECTOMY    . PACEMAKER PLACEMENT    . TOTAL KNEE ARTHROPLASTY Bilateral     Current Outpatient Prescriptions  Medication Sig Dispense Refill  . ALPRAZolam (XANAX) 0.25 MG tablet TAKE 1 TABLET EVERY DAY AS NEEDED FOR ANXIETY 30 tablet 2  . aspirin  81 MG tablet Take 81 mg by mouth daily.    Marland Kitchen azelastine (ASTELIN) 0.1 % nasal spray Place 1 spray into both nostrils 2 (two) times daily. Use in each nostril as directed 30 mL 12  . Biotin 10 MG TABS Take 1 tablet by mouth daily.    . DiphenhydrAMINE HCl (BENADRYL ALLERGY PO) Take 1 tablet by mouth as needed.    Marland Kitchen ELIQUIS 2.5 MG TABS tablet TAKE 1 TABLET (2.5 MG TOTAL) BY MOUTH 2 (TWO) TIMES DAILY. 180 tablet 1  . lipase/protease/amylase (CREON) 12000 units CPEP capsule Take 2 capsules (24,000 Units total) by mouth 4 (four) times daily. 720 capsule 3  . LYRICA 75 MG capsule TAKE ONE CAPSULE BY MOUTH 3 TIMES A DAY 270 capsule 1  . metoprolol tartrate (LOPRESSOR) 25 MG tablet TAKE 1 TABLET BY MOUTH TWICE A DAY 180 tablet 1  . pravastatin (PRAVACHOL) 20 MG tablet TAKE 1 TABLET BY MOUTH EVERY DAY 90 tablet 3  . Probiotic Product (PROBIOTIC PO) Take 1 tablet by mouth daily.    . ranitidine (ZANTAC) 300 MG tablet Take 1 tablet (300 mg total) by mouth at bedtime. 90 tablet 3  . spironolactone (ALDACTONE) 25 MG tablet TAKE 1 TABLET (25 MG TOTAL) BY MOUTH DAILY. 90 tablet 1   No current facility-administered medications for this visit.      ALLERGIES: Patient has no known allergies.  Family History  Problem Relation Age of Onset  . Heart disease Father   . Heart disease Sister     Social History   Social History  . Marital status: Unknown    Spouse name: N/A  . Number of children: 3  . Years of education: N/A   Occupational History  . retired    Social History Main Topics  . Smoking status: Former Smoker    Years: 25.00    Quit date: 08/03/1988  . Smokeless tobacco: Never Used  . Alcohol use Yes  . Drug use: No  . Sexual activity: Not Currently   Other Topics Concern  . Not on file   Social History Narrative  . No narrative on file    Review of Systems  Constitutional: Negative.   HENT: Negative.   Eyes: Negative.   Respiratory: Negative.   Cardiovascular: Negative.    Gastrointestinal: Negative.   Genitourinary: Negative.   Musculoskeletal: Negative.   Skin: Negative.   Neurological: Negative.   Endo/Heme/Allergies: Negative.   Psychiatric/Behavioral: Negative.     PHYSICAL EXAMINATION:    BP 118/70 (BP Location: Right Arm, Patient Position: Sitting, Cuff Size: Normal)   Pulse 80   Resp 16   Wt 173 lb (78.5 kg)   BMI 29.24 kg/m     General appearance: alert, cooperative and appears stated age  Pelvic: External genitalia:  On the lower left labia majora is a 1-1.5 cm smooth, lump, not tender, smaller than 5 days ago, no erythema, appearance of a resolving abscess              Urethra:  normal appearing urethra with no masses, tenderness or lesions              Bartholins and Skenes: normal                   Chaperone was present for exam.  ASSESSMENT Left labial mass, c/w resolving abscess. No longer symptomatic    PLAN F/U in 2 weeks, if the mass doesn't completely resolve, will do another biopsy   An After Visit Summary was printed and given to the patient.  CC: Dr Yetta Barre

## 2016-04-16 ENCOUNTER — Ambulatory Visit (INDEPENDENT_AMBULATORY_CARE_PROVIDER_SITE_OTHER): Payer: Medicare Other | Admitting: Obstetrics and Gynecology

## 2016-04-16 ENCOUNTER — Encounter: Payer: Self-pay | Admitting: Obstetrics and Gynecology

## 2016-04-16 VITALS — BP 118/60 | HR 76 | Resp 16 | Wt 175.0 lb

## 2016-04-16 DIAGNOSIS — N764 Abscess of vulva: Secondary | ICD-10-CM

## 2016-04-16 NOTE — Progress Notes (Signed)
GYNECOLOGY  VISIT   HPI: 83 y.o.   Unknown  Caucasian  female   [id number] with No LMP recorded. Patient is postmenopausal.   here for follow up vulvar abscess      GYNECOLOGIC HISTORY: No LMP recorded. Patient is postmenopausal. Contraception:postmenopause  Menopausal hormone therapy: none         OB History    Gravida Para Term Preterm AB Living   3 3 3     3    SAB TAB Ectopic Multiple Live Births           3         Patient Active Problem List   Diagnosis Date Noted  . Vulvar mass 03/26/2016  . Bacterial overgrowth syndrome 08/31/2015  . Dyslipidemia 08/21/2015  . Non-ischemic cardiomyopathy (HCC) 08/21/2015  . Hyperlipidemia with target LDL less than 100 05/30/2015  . Allergic rhinitis due to pollen 05/30/2015  . Gastroesophageal reflux disease without esophagitis 05/30/2015  . Hyperglycemia 05/30/2015  . Routine general medical examination at a health care facility 05/30/2015  . Pacemaker 08/03/2013  . Atrial fibrillation (HCC) 06/29/2013  . Essential hypertension, benign 06/29/2013  . Hypertriglyceridemia 06/29/2013  . GAD (generalized anxiety disorder) 06/29/2013  . PHN (postherpetic neuralgia) 06/29/2013    Past Medical History:  Diagnosis Date  . Anxiety   . Atrial fibrillation (HCC)   . Blood transfusion without reported diagnosis   . GAD (generalized anxiety disorder)   . Hearing loss   . Hyperlipidemia   . Hypertension   . PHN (postherpetic neuralgia) 2010  . Systolic CHF [facility])     Past Surgical History:  Procedure Laterality Date  . ABDOMINAL HYSTERECTOMY    . PACEMAKER PLACEMENT    . TOTAL KNEE ARTHROPLASTY Bilateral     Current Outpatient Prescriptions  Medication Sig Dispense Refill  . ALPRAZolam (XANAX) 0.25 MG tablet TAKE 1 TABLET EVERY DAY AS NEEDED FOR ANXIETY 30 tablet 2  . aspirin 81 MG tablet Take 81 mg by mouth daily.    Marland Kitchen azelastine (ASTELIN) 0.1 % nasal spray Place 1 spray into both nostrils 2 (two) times daily. Use in each nostril  as directed 30 mL 12  . Biotin 10 MG TABS Take 1 tablet by mouth daily.    . DiphenhydrAMINE HCl (BENADRYL ALLERGY PO) Take 1 tablet by mouth as needed.    Marland Kitchen ELIQUIS 2.5 MG TABS tablet TAKE 1 TABLET (2.5 MG TOTAL) BY MOUTH 2 (TWO) TIMES DAILY. 180 tablet 1  . lipase/protease/amylase (CREON) 12000 units CPEP capsule Take 2 capsules (24,000 Units total) by mouth 4 (four) times daily. 720 capsule 3  . LYRICA 75 MG capsule TAKE ONE CAPSULE BY MOUTH 3 TIMES A DAY 270 capsule 1  . metoprolol tartrate (LOPRESSOR) 25 MG tablet TAKE 1 TABLET BY MOUTH TWICE A DAY 180 tablet 1  . pravastatin (PRAVACHOL) 20 MG tablet TAKE 1 TABLET BY MOUTH EVERY DAY 90 tablet 3  . Probiotic Product (PROBIOTIC PO) Take 1 tablet by mouth daily.    . ranitidine (ZANTAC) 300 MG tablet Take 1 tablet (300 mg total) by mouth at bedtime. 90 tablet 3  . spironolactone (ALDACTONE) 25 MG tablet TAKE 1 TABLET (25 MG TOTAL) BY MOUTH DAILY. 90 tablet 1   No current facility-administered medications for this visit.      ALLERGIES: Patient has no known allergies.  Family History  Problem Relation Age of Onset  . Heart disease Father   . Heart disease Sister     Social  History   Social History  . Marital status: Unknown    Spouse name: N/A  . Number of children: 3  . Years of education: N/A   Occupational History  . retired    Social History Main Topics  . Smoking status: Former Smoker    Years: 25.00    Quit date: 08/03/1988  . Smokeless tobacco: Never Used  . Alcohol use Yes  . Drug use: No  . Sexual activity: Not Currently   Other Topics Concern  . Not on file   Social History Narrative  . No narrative on file    Review of Systems  Constitutional: Negative.   HENT: Negative.   Eyes: Negative.   Respiratory: Negative.   Cardiovascular: Negative.   Gastrointestinal: Negative.   Genitourinary: Negative.   Musculoskeletal: Negative.   Skin: Negative.   Neurological: Negative.   Endo/Heme/Allergies:  Negative.   Psychiatric/Behavioral: Negative.     PHYSICAL EXAMINATION:    BP 118/60 (BP Location: Right Arm, Patient Position: Sitting, Cuff Size: Normal)   Pulse 76   Resp 16   Wt 175 lb (79.4 kg)   BMI 29.57 kg/m     General appearance: alert, cooperative and appears stated age  Pelvic: External genitalia:  no lesions, area of abscess has healed, no lumps, no tenderness, no erythema.              Urethra:  normal appearing urethra with no masses, tenderness or lesions              Bartholins and Skenes: normal                  ASSESSMENT Vulvar abscess, resolved   Plan: F/U as needed   An After Visit Summary was printed and given to the patient.  CC: Dr Yetta Barre

## 2016-05-17 ENCOUNTER — Other Ambulatory Visit: Payer: Self-pay | Admitting: Internal Medicine

## 2016-05-17 DIAGNOSIS — K219 Gastro-esophageal reflux disease without esophagitis: Secondary | ICD-10-CM

## 2016-05-30 ENCOUNTER — Ambulatory Visit (INDEPENDENT_AMBULATORY_CARE_PROVIDER_SITE_OTHER): Payer: Medicare Other | Admitting: *Deleted

## 2016-05-30 DIAGNOSIS — I480 Paroxysmal atrial fibrillation: Secondary | ICD-10-CM

## 2016-05-30 LAB — CUP PACEART REMOTE DEVICE CHECK
Battery Remaining Longevity: 60 mo
Battery Remaining Percentage: 95 %
Brady Statistic RA Percent Paced: 91 %
Brady Statistic RV Percent Paced: 1 %
Date Time Interrogation Session: 20180515074700
Implantable Lead Implant Date: 20150112
Implantable Lead Implant Date: 20150112
Implantable Lead Location: 753859
Implantable Lead Location: 753860
Implantable Lead Model: 4135
Implantable Lead Model: 4136
Implantable Lead Serial Number: 29308444
Implantable Lead Serial Number: 29569556
Implantable Pulse Generator Implant Date: 20150112
Lead Channel Impedance Value: 523 Ohm
Lead Channel Impedance Value: 807 Ohm
Lead Channel Pacing Threshold Amplitude: 0.6 V
Lead Channel Pacing Threshold Amplitude: 0.8 V
Lead Channel Pacing Threshold Pulse Width: 0.4 ms
Lead Channel Pacing Threshold Pulse Width: 0.5 ms
Lead Channel Setting Pacing Amplitude: 1.2 V
Lead Channel Setting Pacing Amplitude: 2.4 V
Lead Channel Setting Pacing Pulse Width: 0.4 ms
Lead Channel Setting Sensing Sensitivity: 3 mV
Pulse Gen Serial Number: 391753

## 2016-05-30 NOTE — Progress Notes (Signed)
Remote pacemaker transmission.   

## 2016-05-31 ENCOUNTER — Encounter: Payer: Self-pay | Admitting: Cardiology

## 2016-06-04 ENCOUNTER — Ambulatory Visit (INDEPENDENT_AMBULATORY_CARE_PROVIDER_SITE_OTHER): Payer: Medicare Other | Admitting: Gastroenterology

## 2016-06-04 ENCOUNTER — Encounter: Payer: Self-pay | Admitting: Gastroenterology

## 2016-06-04 VITALS — BP 122/70 | HR 70 | Ht 64.5 in | Wt 175.0 lb

## 2016-06-04 DIAGNOSIS — K8689 Other specified diseases of pancreas: Secondary | ICD-10-CM | POA: Diagnosis not present

## 2016-06-04 DIAGNOSIS — R197 Diarrhea, unspecified: Secondary | ICD-10-CM

## 2016-06-04 NOTE — Progress Notes (Signed)
Review of pertinent gastrointestinal problems: 1. Presumed pancreatic insufficiency from chronic pancreatitis.  CT scan 09/2015 "The liver is otherwise unremarkable with there is no calcified gallstone or pericholecystic fluid. The common bile duct is within upper limits of normal and measures 9 mm in diameter. No CBD stone identified. Scattered coarse pancreatic calcifications sequela of chronic pancreatitis."  Labs 2017: shows celiac sprue testing negative, GI pathogen panel was negative, fecal lactoferrin was positive, CBC and complete metabolic profile were both normal.    HPI: This is a very pleasant 83 year old woman whom I last saw about 6 months ago  Chief complaint is pancreatic insufficiency  Her bowels really alternate: loose or constipated.  Usually loose, sometimes urgent.  She has never tried Imodium. She sees no bleeding.  She still takes pancreatic enzymes, 2 with every meal, 1 with every snack.    ROS: complete GI ROS as described in HPI, all other review negative.  She has gained 4 pounds since her last OV 6 months ago.  Constitutional:  No unintentional weight loss   Past Medical History:  Diagnosis Date  . Anxiety   . Atrial fibrillation (HCC)   . Blood transfusion without reported diagnosis   . GAD (generalized anxiety disorder)   . Hearing loss   . Hyperlipidemia   . Hypertension   . PHN (postherpetic neuralgia) 2010  . Systolic CHF [facility])     Past Surgical History:  Procedure Laterality Date  . ABDOMINAL HYSTERECTOMY    . PACEMAKER PLACEMENT    . TOTAL KNEE ARTHROPLASTY Bilateral     Current Outpatient Prescriptions  Medication Sig Dispense Refill  . ALPRAZolam (XANAX) 0.25 MG tablet TAKE 1 TABLET EVERY DAY AS NEEDED FOR ANXIETY 30 tablet 2  . aspirin 81 MG tablet Take 81 mg by mouth daily.    Marland Kitchen azelastine (ASTELIN) 0.1 % nasal spray Place 1 spray into both nostrils 2 (two) times daily. Use in each nostril as directed 30 mL 12  . Biotin 10 MG TABS  Take 1 tablet by mouth daily.    . DiphenhydrAMINE HCl (BENADRYL ALLERGY PO) Take 1 tablet by mouth as needed.    Marland Kitchen ELIQUIS 2.5 MG TABS tablet TAKE 1 TABLET (2.5 MG TOTAL) BY MOUTH 2 (TWO) TIMES DAILY. 180 tablet 1  . lipase/protease/amylase (CREON) 12000 units CPEP capsule Take 2 capsules (24,000 Units total) by mouth 4 (four) times daily. 720 capsule 3  . LYRICA 75 MG capsule TAKE ONE CAPSULE BY MOUTH 3 TIMES A DAY 270 capsule 1  . metoprolol tartrate (LOPRESSOR) 25 MG tablet TAKE 1 TABLET BY MOUTH TWICE A DAY 180 tablet 1  . pravastatin (PRAVACHOL) 20 MG tablet TAKE 1 TABLET BY MOUTH EVERY DAY 90 tablet 3  . Probiotic Product (PROBIOTIC PO) Take 1 tablet by mouth daily.    . ranitidine (ZANTAC) 300 MG tablet TAKE 1 TABLET BY MOUTH AT BEDTIME 90 tablet 3  . spironolactone (ALDACTONE) 25 MG tablet TAKE 1 TABLET (25 MG TOTAL) BY MOUTH DAILY. 90 tablet 1   No current facility-administered medications for this visit.     Allergies as of 06/04/2016  . (No Known Allergies)    Family History  Problem Relation Age of Onset  . Heart disease Father   . Heart disease Sister     Social History   Social History  . Marital status: Unknown    Spouse name: N/A  . Number of children: 3  . Years of education: N/A   Occupational History  .  retired    Social History Main Topics  . Smoking status: Former Smoker    Years: 25.00    Quit date: 08/03/1988  . Smokeless tobacco: Never Used  . Alcohol use Yes  . Drug use: No  . Sexual activity: Not Currently   Other Topics Concern  . Not on file   Social History Narrative  . No narrative on file     Physical Exam: BP 122/70 (BP Location: Left Arm, Patient Position: Sitting, Cuff Size: Normal)   Pulse 70   Ht 5' 4.5" (1.638 m)   Wt 175 lb (79.4 kg)   SpO2 99%   BMI 29.57 kg/m  Constitutional: generally well-appearing Psychiatric: alert and oriented x3 Abdomen: soft, nontender, nondistended, no obvious ascites, no peritoneal signs,  normal bowel sounds No peripheral edema noted in lower extremities  Assessment and plan: 83 y.o. female with Presumed pancreatic insufficiency based on multiple scattered calcifications throughout her pancreas on 2017 CT scan, clinical response to enzyme supplements she will continue on pancreatic enzyme supplements.   She still has intermittent loose stools. I'm not sure these are necessarily pancreatic insufficiency related since she has gained 5 pounds since her last visit. She is clearly digesting well. She will continue on her pancreatic enzyme supplements however and I have asked her to add a single Imodium with her dinner meal every day. I'm hoping this will help her post dinner loose stools. She will call to report on her response in for 5 weeks.  Please see the "Patient Instructions" section for addition details about the plan.  Rob Bunting, MD [facility] 06/04/2016, 1:40 PM

## 2016-06-04 NOTE — Patient Instructions (Signed)
Add a single imodium pill at dinner time every night. Stay on pancreatic enzymes as you've been doing. Call in 1 month to report on your response.

## 2016-06-05 ENCOUNTER — Ambulatory Visit (INDEPENDENT_AMBULATORY_CARE_PROVIDER_SITE_OTHER): Payer: Medicare Other | Admitting: Podiatry

## 2016-06-05 ENCOUNTER — Ambulatory Visit (INDEPENDENT_AMBULATORY_CARE_PROVIDER_SITE_OTHER): Payer: Medicare Other

## 2016-06-05 DIAGNOSIS — M76822 Posterior tibial tendinitis, left leg: Secondary | ICD-10-CM | POA: Diagnosis not present

## 2016-06-05 DIAGNOSIS — M79672 Pain in left foot: Secondary | ICD-10-CM

## 2016-06-06 NOTE — Progress Notes (Signed)
   HPI:  83 year old female presents to the office today for evaluation of pain and tenderness to the left foot and ankle. Patient states that she enjoys walking however she is unable to walk great distances without significant pain. This is been going on for approximately 2 years now. Pain is intermittent. Patient denies trauma. Patient states that the pain is worse when walking and standing. Alleviated by rest.    Physical Exam: General: The patient is alert and oriented x3 in no acute distress.  Dermatology: Skin is warm, dry and supple bilateral lower extremities. Negative for open lesions or macerations.  Vascular: Palpable pedal pulses bilaterally. No edema or erythema noted. Capillary refill within normal limits.  Neurological: Epicritic and protective threshold grossly intact bilaterally.   Musculoskeletal Exam: Pain on palpation along the posterior tibial tendon left lower extremity. When the foot is loaded there is complete medial longitudinal arch collapse with the rear foot valgus and has abductus.  Radiographic Exam:  Mild degenerative changes noted diffusely throughout the left lower extremity foot and ankle. Negative for fracture. Moderate demineralization noted consistent with osteoporosis.  Assessment: 1. Posterior tibial tendon dysfunction left lower extremity 2. Medial longitudinal arch collapse left 3. Rear foot valgus left   Plan of Care:  1. Patient was evaluated. X-rays were reviewed today 2. Today we're going to set up an appointment with Raiford Noble, Pedorthist, for custom molded bracing. 3. Return to clinic in 4 weeks to pick up braces   Felecia Shelling, DPM [facility]  Dr. Felecia Shelling, DPM    [address]                                        [place], [place] [address]                Office [phone]  Fax [phone]

## 2016-06-07 ENCOUNTER — Ambulatory Visit: Payer: Medicare Other | Admitting: *Deleted

## 2016-06-07 DIAGNOSIS — M76822 Posterior tibial tendinitis, left leg: Secondary | ICD-10-CM

## 2016-06-07 DIAGNOSIS — M25372 Other instability, left ankle: Secondary | ICD-10-CM

## 2016-06-07 NOTE — Progress Notes (Unsigned)
Patient's appt today is for measuring/casting for [facility] AFO....patient has definitive medial column collapse with talus/navicular shift medially...patient has good ROM in saggital and frontal plane w/ 4/5 respectfully.   Patient was cast for brace and tolerated all procedures well..  Patient choose [place] [employer]ze.Marland KitchenMarland KitchenMarland Kitchen

## 2016-06-09 ENCOUNTER — Other Ambulatory Visit: Payer: Self-pay | Admitting: Internal Medicine

## 2016-06-09 DIAGNOSIS — J301 Allergic rhinitis due to pollen: Secondary | ICD-10-CM

## 2016-06-20 ENCOUNTER — Other Ambulatory Visit: Payer: Self-pay | Admitting: Internal Medicine

## 2016-06-20 DIAGNOSIS — J301 Allergic rhinitis due to pollen: Secondary | ICD-10-CM

## 2016-06-27 ENCOUNTER — Other Ambulatory Visit: Payer: Self-pay | Admitting: Internal Medicine

## 2016-07-04 ENCOUNTER — Telehealth: Payer: Self-pay | Admitting: Gastroenterology

## 2016-07-04 NOTE — Telephone Encounter (Signed)
Pt states the imodium is helping some but still has some loose stools late in the day.  She was advised to take an extra imodium as needed.  She will call back if her symptoms do not resolve or any new symptoms

## 2016-07-09 ENCOUNTER — Other Ambulatory Visit: Payer: Self-pay | Admitting: Internal Medicine

## 2016-07-09 ENCOUNTER — Telehealth: Payer: Self-pay | Admitting: Orthotics

## 2016-07-09 DIAGNOSIS — F411 Generalized anxiety disorder: Secondary | ICD-10-CM

## 2016-07-10 NOTE — Telephone Encounter (Signed)
Faxed script to [facility].../lmb 

## 2016-07-13 ENCOUNTER — Other Ambulatory Visit: Payer: Self-pay | Admitting: Internal Medicine

## 2016-07-13 DIAGNOSIS — J301 Allergic rhinitis due to pollen: Secondary | ICD-10-CM

## 2016-07-15 ENCOUNTER — Ambulatory Visit (INDEPENDENT_AMBULATORY_CARE_PROVIDER_SITE_OTHER): Payer: Medicare Other | Admitting: Orthotics

## 2016-07-15 DIAGNOSIS — M76822 Posterior tibial tendinitis, left leg: Secondary | ICD-10-CM | POA: Diagnosis not present

## 2016-07-15 DIAGNOSIS — M25372 Other instability, left ankle: Secondary | ICD-10-CM | POA: Diagnosis not present

## 2016-07-16 ENCOUNTER — Telehealth: Payer: Self-pay | Admitting: Gastroenterology

## 2016-07-16 NOTE — Progress Notes (Signed)
Patient  came in to pick up [place] brace  The brace fit well and immediately patient's  gait approved.  The brace provided desired m-l stability in frontal/transverse planes and aided in dorsiflexion in saggital plane. Patient was able to don and doff brace with minimal difficulty.  Overall patient pleased with fit and functionality of brace.  

## 2016-07-16 NOTE — Telephone Encounter (Signed)
Pt has been advised to stop imodium and try benadryl for the skin rash, she states it looks like bug bites. I advised it may not be the imodium but to try and stop it and see if it improves.   If the rash does not improve she will call her PCP.

## 2016-07-19 ENCOUNTER — Other Ambulatory Visit: Payer: Self-pay | Admitting: Internal Medicine

## 2016-07-22 ENCOUNTER — Other Ambulatory Visit: Payer: Self-pay | Admitting: Internal Medicine

## 2016-07-22 NOTE — Telephone Encounter (Signed)
Faxed to [facility].  

## 2016-07-24 ENCOUNTER — Telehealth: Payer: Self-pay | Admitting: Gastroenterology

## 2016-07-24 NOTE — Telephone Encounter (Signed)
Left message on machine to call back  

## 2016-07-25 ENCOUNTER — Telehealth: Payer: Self-pay | Admitting: Gastroenterology

## 2016-07-25 MED ORDER — DIPHENOXYLATE-ATROPINE 2.5-0.025 MG PO TABS: 1.0000 | ORAL_TABLET | Freq: Two times a day (BID) | ORAL | 3 refills | Status: DC

## 2016-07-25 NOTE — Telephone Encounter (Signed)
Pt states she has began to have diarrhea since stopping the imodium d/t hives.  The hives have resolved but she is having explosive diarrhea with every meal.  Please advise.

## 2016-07-25 NOTE — Telephone Encounter (Signed)
Pt said she is returning your call [phone]

## 2016-07-25 NOTE — Telephone Encounter (Signed)
OK, lets try lomotil, 1 pill twice daily as she had previously been taking the imodium.  Disp 60 with 3 refills.  She should call to report on her response in 4-5 weeks.

## 2016-07-25 NOTE — Telephone Encounter (Signed)
Prescription faxed and pt has been notified

## 2016-07-26 NOTE — Telephone Encounter (Signed)
Patient states that lomotil may need prior auth. Best # [id number]

## 2016-07-26 NOTE — Telephone Encounter (Signed)
The pt has been advised prior auth was sent in and the insurance will take up to 48 hours to make a decision.  Pt will call back if not heard from me by tuesday

## 2016-08-13 ENCOUNTER — Telehealth: Payer: Self-pay | Admitting: Gastroenterology

## 2016-08-13 NOTE — Telephone Encounter (Signed)
Pt states she did not take her creon yesterday with breakfast, she did take it the rest of the day. She reports that this morning she is very nauseated, does not feel like she can eat. She did take her morning meds and her lomotil. States she just feels so sick and does not know what to do. Please advise.

## 2016-08-13 NOTE — Telephone Encounter (Signed)
Ok thanks 

## 2016-08-13 NOTE — Telephone Encounter (Signed)
Spoke with pt and she states that after she spoke with me she drank a cup of coffee and had a large bowel movement and she feels much better, no more nausea. Pt doesn't think she need to have anything done at this point. Dr. Christella Hartigan notified.

## 2016-08-13 NOTE — Telephone Encounter (Signed)
I think she needs labs (cbc, cmet) and imaging (CT scan abd and pelvis with IV and oral contrast).  Can you also call her in zofran 4mg  pills, take one pill twice daily PRn nausea for now,  Disp 60 with 3 refills.

## 2016-08-14 ENCOUNTER — Telehealth: Payer: Self-pay | Admitting: Gastroenterology

## 2016-08-14 NOTE — Telephone Encounter (Signed)
States when she got up this morning she was again nauseated. She has not vomited. She has taken her medications. Began having "diarrhea" after her breakfast. She cannot put a number to how many times, but says "everytime I move." Her upper abdomen feels tender. She would like to take a dose of Pepto-Bismol.

## 2016-08-14 NOTE — Telephone Encounter (Signed)
Patient is advised.  

## 2016-08-14 NOTE — Telephone Encounter (Signed)
Yes, pepto is safe.  Let her know it may turn her stools dark, black

## 2016-08-29 ENCOUNTER — Ambulatory Visit (INDEPENDENT_AMBULATORY_CARE_PROVIDER_SITE_OTHER): Payer: Medicare Other | Admitting: *Deleted

## 2016-08-29 DIAGNOSIS — I480 Paroxysmal atrial fibrillation: Secondary | ICD-10-CM

## 2016-08-29 DIAGNOSIS — Z950 Presence of cardiac pacemaker: Secondary | ICD-10-CM

## 2016-08-30 NOTE — Progress Notes (Signed)
Remote pacemaker check. 

## 2016-09-02 ENCOUNTER — Encounter: Payer: Self-pay | Admitting: Internal Medicine

## 2016-09-02 ENCOUNTER — Ambulatory Visit (INDEPENDENT_AMBULATORY_CARE_PROVIDER_SITE_OTHER): Payer: Medicare Other | Admitting: Internal Medicine

## 2016-09-02 VITALS — BP 118/58 | HR 70 | Ht 64.5 in | Wt 177.0 lb

## 2016-09-02 DIAGNOSIS — I428 Other cardiomyopathies: Secondary | ICD-10-CM | POA: Diagnosis not present

## 2016-09-02 DIAGNOSIS — E785 Hyperlipidemia, unspecified: Secondary | ICD-10-CM | POA: Diagnosis not present

## 2016-09-02 DIAGNOSIS — I10 Essential (primary) hypertension: Secondary | ICD-10-CM

## 2016-09-02 DIAGNOSIS — Z950 Presence of cardiac pacemaker: Secondary | ICD-10-CM | POA: Diagnosis not present

## 2016-09-02 DIAGNOSIS — I480 Paroxysmal atrial fibrillation: Secondary | ICD-10-CM

## 2016-09-02 NOTE — Progress Notes (Signed)
OFFICE NOTE  Chief Complaint:  Knee pain  Primary Care Physician: Etta Grandchild, MD  HPI:  Deborah Chung is a pleasant 83 year old female who recently moved to [place] from [place]. She was apparently living in [place], somewhere outside of [place]. Her son is Deisi Salonga, who is the in-house counsel for the [employer] system. She was previously seeing [facility] and had an episode in February of 2015. Apparently she had a mobile health screening and was found to be out of rhythm. She was sent to her primary care doctor who did an EKG and noted she was in A. fib and was sent immediately to the hospital. As the story goes, she was on several treatments for her A. fib and apparently eventually underwent cardiac catheterization and/or possibly an ablation procedure for which she developed asystole or some type of arrhythmia requiring her to be resuscitated. Obviously this was successful and she received apparently a pacemaker. She is not clear as to what the type of pacemaker wasn't did not provide documentation today. I've not yet received any records from her cardiologist in [place]. She was placed on Eliquis for anticoagulation. In addition, it appears she is on digoxin and Aldactone along with Lopressor which is suggestive of possible cardiomyopathy. She apparently does have a heart failure diagnosis however I'm not certain as to what her ejection fraction is. She reports a remote check device for her pacemaker and did a remote check apparently last week which went to [place]. Symptomatically she denies any chest pain or shortness of breath.  I saw Deborah Chung back today in the office. She is doing fairly well. She saw Dr. Lewayne Bunting in November who performed a pacemaker check and felt that she was doing well in sinus rhythm. There is no evidence for recurrent atrial fibrillation. She has yet to be enrolled in home monitoring. Twice she has  requested a home monitor but it has not been yet sent to her. I will need to look into why she has not received her home monitoring device. 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 Eyes a chest pain or worsening shortness of breath. As mentioned recent laboratory work from her primary shows excellent cholesterol control with total cholesterol 174, triglycerides 124, HDL 59 and LDL of 90.  At the pleasure seeing Deborah Chung back in the office today. She reports doing fairly well. She still has some issues with swelling in her legs and discomfort with achiness and heaviness. She's had bilateral knee surgeries and is noted to have some varicose veins as well as some discoloration over her ankles. From a cardiac standpoint she had an echo which shows preserved systolic function, this represents a marked improvement since her EF was reported around 10% at one point but this may been after cardiac arrest when she got her pacemaker placed. Pacemaker function appears to be normal. She's had little if any atrial fibrillation and is on Eliquis for a CHADSVASC score of 4.  I saw Deborah Chung back today in the office. Overall she is feeling well. She denies any worsening shortness of breath or swelling. EF is now normalized. She follows with Dr. Ladona Ridgel for pacemaker. She's had no bleeding problems on Eliquis. She is on low-dose digoxin which will require monitoring. She is overdue for cholesterol check.  08/21/2015  Deborah Chung returns today for follow-up. In the interim she had a pacer remote check with a stable burden of atrial fibrillation. She continues  to do well on Eliquis. We discontinued her digoxin and her last office visit for a borderline elevated digoxin level and the fact that it is really not indicated for A. fib and her congestive heart failure has improved with normal LV function. Blood pressure is well-controlled today. Her only other complaint is that she's had diarrhea for approximately 7 days.  There is no fever or chills associated with that. He came on abruptly. She denied any sick contacts. She is using Imodium for this.  03/05/2016  Deborah Chung returns today for follow-up. Overall she feels well. She had recent adjustment in her pacemaker when she saw Dr. Ladona Ridgel and is atrial paced today is 70. Blood pressure is normal 118/62. She denies any bleeding problems on L a course. She is not aware of her A. fib. Recently she had a bout with diarrhea was found to have a pancreatic insufficiency. She is on Creon.  09/03/2078  Deborah Chung was seen today in follow-up. Her main complaint is bilateral knee pain. She has a history of knee replacement the past but her surgeon now lives in [place]. She's hesitant to do anything different about it. She's not as active as she's been in the past. She continues to have some problems with pancreatic insufficiency and pain associated with that. She denies any chest pain or worsening shortness of breath. Her pacemaker remote checks of been stable. The last was in May which showed a very low burden of A. fib of only 2%.  PMHx:  Past Medical History:  Diagnosis Date  . Anxiety   . Atrial fibrillation (HCC)   . Blood transfusion without reported diagnosis   . GAD (generalized anxiety disorder)   . Hearing loss   . Hyperlipidemia   . Hypertension   . PHN (postherpetic neuralgia) 2010  . Systolic CHF [facility])     Past Surgical History:  Procedure Laterality Date  . ABDOMINAL HYSTERECTOMY    . PACEMAKER PLACEMENT    . TOTAL KNEE ARTHROPLASTY Bilateral     FAMHx:  Family History  Problem Relation Age of Onset  . Heart disease Father   . Heart disease Sister     SOCHx:   reports that she quit smoking about 28 years ago. She quit after 25.00 years of use. She has never used smokeless tobacco. She reports that she drinks alcohol. She reports that she does not use drugs.  ALLERGIES:  No Known Allergies  ROS: Pertinent items noted in HPI and  remainder of comprehensive ROS otherwise negative.  HOME MEDS: Current Outpatient Prescriptions  Medication Sig Dispense Refill  . ALPRAZolam (XANAX) 0.25 MG tablet TAKE 1 TABLET BY MOUTH EVERY DAY AS NEEDED FOR ANXIETY 30 tablet 2  . aspirin 81 MG tablet Take 81 mg by mouth daily.    Marland Kitchen azelastine (ASTELIN) 0.1 % nasal spray PLACE 1 SPRAY INTO BOTH NOSTRILS 2 (TWO) TIMES DAILY. USE IN EACH NOSTRIL AS DIRECTED 30 mL 11  . azelastine (ASTELIN) 0.1 % nasal spray PLACE 1 SPRAY INTO BOTH NOSTRILS 2 (TWO) TIMES DAILY. USE IN EACH NOSTRIL AS DIRECTED 30 mL 6  . Biotin 10 MG TABS Take 1 tablet by mouth daily.    . cetirizine (ZYRTEC) 10 MG tablet TAKE 1 TABLET BY MOUTH DAILY 30 tablet 11  . DiphenhydrAMINE HCl (BENADRYL ALLERGY PO) Take 1 tablet by mouth as needed.    . diphenoxylate-atropine (LOMOTIL) 2.5-0.025 MG tablet Take 1 tablet by mouth 2 (two) times daily. 60 tablet 3  .  ELIQUIS 2.5 MG TABS tablet TAKE 1 TABLET (2.5 MG TOTAL) BY MOUTH 2 (TWO) TIMES DAILY. 180 tablet 1  . lipase/protease/amylase (CREON) 12000 units CPEP capsule Take 2 capsules (24,000 Units total) by mouth 4 (four) times daily. 720 capsule 3  . LYRICA 75 MG capsule TAKE ONE CAPSULE BY MOUTH 3 TIMES A DAY 270 capsule 1  . metoprolol tartrate (LOPRESSOR) 25 MG tablet TAKE 1 TABLET BY MOUTH TWICE A DAY 180 tablet 1  . metoprolol tartrate (LOPRESSOR) 25 MG tablet TAKE 1 TABLET (25 MG TOTAL) BY MOUTH 2 (TWO) TIMES DAILY. 180 tablet 1  . pravastatin (PRAVACHOL) 20 MG tablet TAKE 1 TABLET EVERY DAY 90 tablet 3  . Probiotic Product (PROBIOTIC PO) Take 1 tablet by mouth daily.    . ranitidine (ZANTAC) 300 MG tablet TAKE 1 TABLET BY MOUTH AT BEDTIME 90 tablet 3  . spironolactone (ALDACTONE) 25 MG tablet TAKE 1 TABLET (25 MG TOTAL) BY MOUTH DAILY. 90 tablet 1   No current facility-administered medications for this visit.     LABS/IMAGING: No results found for this or any previous visit (from the past 48 hour(s)). No results  found.  VITALS: BP (!) 118/58   Pulse 70   Ht 5' 4.5" (1.638 m)   Wt 177 lb (80.3 kg)   BMI 29.91 kg/m   EXAM: General appearance: alert and no distress Neck: no carotid bruit and no JVD Lungs: clear to auscultation bilaterally and pacemaker in well-healed pocket in the left upper chest Heart: regular rate and rhythm, S1, S2 normal, no murmur, click, rub or gallop Abdomen: soft, non-tender; bowel sounds normal; no masses,  no organomegaly Extremities: extremities normal, atraumatic, no cyanosis or edema, varicose veins noted and Shiny skin without pitting edema Pulses: 2+ and symmetric Skin: Skin color, texture, turgor normal. No rashes or lesions Neurologic: Grossly normal Psych: NOrmal  EKG: Atrial paced rhythm at 70 moderate voltage criteria for LVH-personally reviewed  ASSESSMENT: 1. Diarrhea - related to pancreatic insufficieny 2. History of atrial fibrillation on anticoagulation with Eliquis (CHADVASC 4) 3. Hypertension 4. Dyslipidemia 5. Status post pacemaker - for sinus node dysfunction and complete heart block 6. Reported history of EF of 10-15%, now improved to 55-60%  PLAN: 1.  Deborah Chung is doing well with regards her A. fib. She is anticoagulated on Eliquis without any bleeding problems. She status post pacemaker with normal function on recent remote checks. EF improved up to 55-60%. She's having some problems her knee pain and I'm happy to refer to orthopedist if she wishes. Follow-up with me annually or sooner as necessary.  Deborah Nose, MD, [facility] Attending Cardiologist [facility]  Deborah Chung 09/02/2016, 1:35 PM

## 2016-09-02 NOTE — Patient Instructions (Signed)
Your physician wants you to follow-up in: 12 months with Dr. Hilty. You will receive a reminder letter in the mail two months in advance. If you don't receive a letter, please call our office to schedule the follow-up appointment.  

## 2016-09-05 LAB — CUP PACEART REMOTE DEVICE CHECK
Battery Remaining Longevity: 60 mo
Battery Remaining Percentage: 91 %
Brady Statistic RA Percent Paced: 91 %
Brady Statistic RV Percent Paced: 1 %
Date Time Interrogation Session: 20180816074700
Implantable Lead Implant Date: 20150112
Implantable Lead Implant Date: 20150112
Implantable Lead Location: 753859
Implantable Lead Location: 753860
Implantable Lead Model: 4135
Implantable Lead Model: 4136
Implantable Lead Serial Number: 29308444
Implantable Lead Serial Number: 29569556
Implantable Pulse Generator Implant Date: 20150112
Lead Channel Impedance Value: 536 Ohm
Lead Channel Impedance Value: 863 Ohm
Lead Channel Pacing Threshold Amplitude: 0.6 V
Lead Channel Pacing Threshold Amplitude: 0.8 V
Lead Channel Pacing Threshold Pulse Width: 0.4 ms
Lead Channel Pacing Threshold Pulse Width: 0.5 ms
Lead Channel Setting Pacing Amplitude: 1.1 V
Lead Channel Setting Pacing Amplitude: 2.4 V
Lead Channel Setting Pacing Pulse Width: 0.4 ms
Lead Channel Setting Sensing Sensitivity: 3 mV
Pulse Gen Serial Number: 391753

## 2016-09-09 ENCOUNTER — Other Ambulatory Visit: Payer: Self-pay | Admitting: Internal Medicine

## 2016-09-13 ENCOUNTER — Encounter: Payer: Self-pay | Admitting: Cardiology

## 2016-10-05 ENCOUNTER — Other Ambulatory Visit: Payer: Self-pay | Admitting: Internal Medicine

## 2016-10-05 DIAGNOSIS — F411 Generalized anxiety disorder: Secondary | ICD-10-CM

## 2016-10-07 NOTE — Telephone Encounter (Signed)
rx faxed to [facility].  

## 2016-11-19 ENCOUNTER — Other Ambulatory Visit: Payer: Self-pay

## 2016-11-19 MED ORDER — PANCRELIPASE (LIP-PROT-AMYL) 12000-38000 UNITS PO CPEP: 24000.0000 [IU] | ORAL_CAPSULE | Freq: Four times a day (QID) | ORAL | 3 refills | Status: DC

## 2016-11-27 ENCOUNTER — Encounter: Payer: Self-pay | Admitting: Internal Medicine

## 2016-11-27 ENCOUNTER — Ambulatory Visit: Payer: Medicare Other | Admitting: Internal Medicine

## 2016-11-27 VITALS — BP 128/60 | HR 70 | Ht 64.5 in | Wt 180.0 lb

## 2016-11-27 DIAGNOSIS — I495 Sick sinus syndrome: Secondary | ICD-10-CM | POA: Diagnosis not present

## 2016-11-27 DIAGNOSIS — I480 Paroxysmal atrial fibrillation: Secondary | ICD-10-CM | POA: Diagnosis not present

## 2016-11-27 DIAGNOSIS — Z950 Presence of cardiac pacemaker: Secondary | ICD-10-CM

## 2016-11-27 NOTE — Progress Notes (Signed)
HPI [name] returns today for ongoing evaluation and management of sinus node dysfunction, paroxysmal atrial fibrillation, status post permanent pacemaker insertion and hypertension.. The patient has done well in the interim with no episodes of syncope and no chest pain. She has become more sedentary has time is gone. She enjoys watching television. No Known Allergies   Current Outpatient Medications  Medication Sig Dispense Refill  . ALPRAZolam (XANAX) 0.25 MG tablet TAKE 1 TABLET EVERY DAY AS NEEDED FOR ANXIETY 30 tablet 2  . aspirin 81 MG tablet Take 81 mg by mouth daily.    . Biotin 10 MG TABS Take 1 tablet by mouth daily.    . cetirizine (ZYRTEC) 10 MG tablet TAKE 1 TABLET BY MOUTH DAILY 30 tablet 11  . DiphenhydrAMINE HCl (BENADRYL ALLERGY PO) Take 1 tablet by mouth as needed.    . diphenoxylate-atropine (LOMOTIL) 2.5-0.025 MG tablet Take 1 tablet by mouth 2 (two) times daily. 60 tablet 3  . ELIQUIS 2.5 MG TABS tablet TAKE 1 TABLET (2.5 MG TOTAL) BY MOUTH 2 (TWO) TIMES DAILY. 180 tablet 1  . lipase/protease/amylase (CREON) 12000 units CPEP capsule Take 2 capsules (24,000 Units total) 4 (four) times daily by mouth. 720 capsule 3  . LYRICA 75 MG capsule TAKE ONE CAPSULE BY MOUTH 3 TIMES A DAY 270 capsule 1  . metoprolol tartrate (LOPRESSOR) 25 MG tablet TAKE 1 TABLET BY MOUTH TWICE A DAY 180 tablet 1  . metoprolol tartrate (LOPRESSOR) 25 MG tablet TAKE 1 TABLET (25 MG TOTAL) BY MOUTH 2 (TWO) TIMES DAILY. 180 tablet 1  . pravastatin (PRAVACHOL) 20 MG tablet TAKE 1 TABLET EVERY DAY 90 tablet 3  . Probiotic Product (PROBIOTIC PO) Take 1 tablet by mouth daily.    . ranitidine (ZANTAC) 300 MG tablet TAKE 1 TABLET BY MOUTH AT BEDTIME 90 tablet 3  . spironolactone (ALDACTONE) 25 MG tablet TAKE 1 TABLET (25 MG TOTAL) BY MOUTH DAILY. 90 tablet 1   No current facility-administered medications for this visit.      Past Medical History:  Diagnosis Date  . Anxiety   . Atrial  fibrillation (HCC)   . Blood transfusion without reported diagnosis   . GAD (generalized anxiety disorder)   . Hearing loss   . Hyperlipidemia   . Hypertension   . PHN (postherpetic neuralgia) 2010  . Systolic CHF (HCC)     ROS:   All systems reviewed and negative except as noted in the HPI.   Past Surgical History:  Procedure Laterality Date  . ABDOMINAL HYSTERECTOMY    . PACEMAKER PLACEMENT    . TOTAL KNEE ARTHROPLASTY Bilateral      Family History  Problem Relation Age of Onset  . Heart disease Father   . Heart disease Sister      Social History   Socioeconomic History  . Marital status: Unknown    Spouse name: Not on file  . Number of children: 3  . Years of education: Not on file  . Highest education level: Not on file  Social Needs  . Financial resource strain: Not on file  . Food insecurity - worry: Not on file  . Food insecurity - inability: Not on file  . Transportation needs - medical: Not on file  . Transportation needs - non-medical: Not on file  Occupational History  . Occupation: retired  Tobacco Use  . Smoking status: Former Smoker    Years: 25.00    Last attempt to quit: 08/03/1988  Years since quitting: 28.3  . Smokeless tobacco: Never Used  Substance and Sexual Activity  . Alcohol use: Yes  . Drug use: No  . Sexual activity: Not Currently  Other Topics Concern  . Not on file  Social History Narrative  . Not on file     BP 128/60   Pulse 70   Ht 5' 4.5" (1.638 m)   Wt 180 lb (81.6 kg)   SpO2 98%   BMI 30.42 kg/m   Physical Exam:  Well appearing 83 year old woman, NAD HEENT: Unremarkable Neck:  6 cm JVD, no thyromegally Lymphatics:  No adenopathy Back:  No CVA tenderness Lungs:  Clear, with no wheezes, rales, or rhonchi HEART:  Regular rate rhythm, no murmurs, no rubs, no clicks Abd:  soft, positive bowel sounds, no organomegally, no rebound, no guarding Ext:  2 plus pulses, no edema, no cyanosis, no clubbing Skin:  No  rashes no nodules Neuro:  CN II through XII intact, motor grossly intact   DEVICE  Normal device function.  See PaceArt for details.   Assess/Plan: 1. Sinus node dysfunction - she is asymptomatic status post dual-chamber pacemaker insertion. 2. Pacemaker her [employer] dual-chamber pacemaker is working normally. We'll plan to recheck in several months. 3. Paroxysmal atrial fibrillation - she will continue her current medical therapy with an oral anticoagulant, she will continue her beta blocker. No indication for anti-Arrhythmic therapy. 4. Obesity - despite her advanced age, she remains overweight. I've encouraged the patient to reduce her oral intake and try to become more active.   Lewayne Bunting, M.D.

## 2016-11-27 NOTE — Patient Instructions (Signed)

## 2016-11-28 ENCOUNTER — Encounter: Payer: Medicare Other | Admitting: *Deleted

## 2016-11-29 LAB — CUP PACEART INCLINIC DEVICE CHECK
Date Time Interrogation Session: 20181116095601
Implantable Lead Implant Date: 20150112
Implantable Lead Implant Date: 20150112
Implantable Lead Location: 753859
Implantable Lead Location: 753860
Implantable Lead Model: 4135
Implantable Lead Model: 4136
Implantable Lead Serial Number: 29308444
Implantable Lead Serial Number: 29569556
Implantable Pulse Generator Implant Date: 20150112
Lead Channel Setting Pacing Amplitude: 1.1 V
Lead Channel Setting Pacing Amplitude: 2.4 V
Lead Channel Setting Pacing Pulse Width: 0.4 ms
Lead Channel Setting Sensing Sensitivity: 3 mV
Pulse Gen Serial Number: 391753

## 2016-12-26 ENCOUNTER — Other Ambulatory Visit: Payer: Self-pay | Admitting: Internal Medicine

## 2016-12-27 ENCOUNTER — Other Ambulatory Visit: Payer: Self-pay | Admitting: Gastroenterology

## 2016-12-27 ENCOUNTER — Telehealth: Payer: Self-pay | Admitting: Gastroenterology

## 2016-12-27 NOTE — Telephone Encounter (Signed)
Prior auth was sent via My Chart, waiting for response.

## 2017-01-08 ENCOUNTER — Other Ambulatory Visit: Payer: Self-pay | Admitting: Internal Medicine

## 2017-01-08 DIAGNOSIS — F411 Generalized anxiety disorder: Secondary | ICD-10-CM

## 2017-01-15 ENCOUNTER — Other Ambulatory Visit: Payer: Self-pay | Admitting: Internal Medicine

## 2017-01-15 DIAGNOSIS — E785 Hyperlipidemia, unspecified: Secondary | ICD-10-CM

## 2017-01-15 MED ORDER — PRAVASTATIN SODIUM 20 MG PO TABS: 20.0000 mg | ORAL_TABLET | Freq: Every day | ORAL | 1 refills | Status: DC

## 2017-02-26 ENCOUNTER — Ambulatory Visit (INDEPENDENT_AMBULATORY_CARE_PROVIDER_SITE_OTHER): Payer: Medicare Other | Admitting: *Deleted

## 2017-02-26 DIAGNOSIS — I495 Sick sinus syndrome: Secondary | ICD-10-CM | POA: Diagnosis not present

## 2017-02-26 NOTE — Progress Notes (Signed)
Remote pacemaker transmission.   

## 2017-02-27 ENCOUNTER — Encounter: Payer: Self-pay | Admitting: Cardiology

## 2017-02-28 ENCOUNTER — Other Ambulatory Visit: Payer: Self-pay | Admitting: Gastroenterology

## 2017-03-14 LAB — CUP PACEART REMOTE DEVICE CHECK
Battery Remaining Longevity: 48 mo
Battery Remaining Percentage: 77 %
Brady Statistic RA Percent Paced: 93 %
Brady Statistic RV Percent Paced: 1 %
Date Time Interrogation Session: 20190213081200
Implantable Lead Implant Date: 20150112
Implantable Lead Implant Date: 20150112
Implantable Lead Location: 753859
Implantable Lead Location: 753860
Implantable Lead Model: 4135
Implantable Lead Model: 4136
Implantable Lead Serial Number: 29308444
Implantable Lead Serial Number: 29569556
Implantable Pulse Generator Implant Date: 20150112
Lead Channel Impedance Value: 541 Ohm
Lead Channel Impedance Value: 876 Ohm
Lead Channel Pacing Threshold Amplitude: 0.7 V
Lead Channel Pacing Threshold Amplitude: 0.8 V
Lead Channel Pacing Threshold Pulse Width: 0.4 ms
Lead Channel Pacing Threshold Pulse Width: 0.5 ms
Lead Channel Setting Pacing Amplitude: 1.2 V
Lead Channel Setting Pacing Amplitude: 2.4 V
Lead Channel Setting Pacing Pulse Width: 0.4 ms
Lead Channel Setting Sensing Sensitivity: 3 mV
Pulse Gen Serial Number: 391753

## 2017-03-15 ENCOUNTER — Other Ambulatory Visit: Payer: Self-pay | Admitting: Internal Medicine

## 2017-04-08 ENCOUNTER — Other Ambulatory Visit: Payer: Self-pay | Admitting: Internal Medicine

## 2017-04-08 DIAGNOSIS — F411 Generalized anxiety disorder: Secondary | ICD-10-CM

## 2017-04-19 ENCOUNTER — Other Ambulatory Visit: Payer: Self-pay | Admitting: Internal Medicine

## 2017-04-30 ENCOUNTER — Other Ambulatory Visit: Payer: Self-pay | Admitting: Gastroenterology

## 2017-04-30 NOTE — Telephone Encounter (Signed)
Dr Christella Hartigan pt request a refill on Lomotil which is not on her med list at this time. Last seen 06/04/16. Do you want to refill this med? Please advise.

## 2017-05-09 ENCOUNTER — Telehealth: Payer: Self-pay | Admitting: Gastroenterology

## 2017-05-09 NOTE — Telephone Encounter (Signed)
The pt was advised to try miralax 1 capsul daily and call back if she has no response.

## 2017-05-28 ENCOUNTER — Ambulatory Visit (INDEPENDENT_AMBULATORY_CARE_PROVIDER_SITE_OTHER): Payer: Medicare Other | Admitting: *Deleted

## 2017-05-28 DIAGNOSIS — I480 Paroxysmal atrial fibrillation: Secondary | ICD-10-CM | POA: Diagnosis not present

## 2017-05-28 NOTE — Progress Notes (Signed)
Remote pacemaker transmission.   

## 2017-05-30 ENCOUNTER — Encounter: Payer: Self-pay | Admitting: Cardiology

## 2017-06-03 ENCOUNTER — Other Ambulatory Visit: Payer: Self-pay | Admitting: Internal Medicine

## 2017-06-10 LAB — CUP PACEART REMOTE DEVICE CHECK
Battery Remaining Longevity: 48 mo
Battery Remaining Percentage: 73 %
Brady Statistic RA Percent Paced: 93 %
Brady Statistic RV Percent Paced: 1 %
Date Time Interrogation Session: 20190515072300
Implantable Lead Implant Date: 20150112
Implantable Lead Implant Date: 20150112
Implantable Lead Location: 753859
Implantable Lead Location: 753860
Implantable Lead Model: 4135
Implantable Lead Model: 4136
Implantable Lead Serial Number: 29308444
Implantable Lead Serial Number: 29569556
Implantable Pulse Generator Implant Date: 20150112
Lead Channel Impedance Value: 516 Ohm
Lead Channel Impedance Value: 831 Ohm
Lead Channel Pacing Threshold Amplitude: 0.7 V
Lead Channel Pacing Threshold Amplitude: 0.8 V
Lead Channel Pacing Threshold Pulse Width: 0.4 ms
Lead Channel Pacing Threshold Pulse Width: 0.5 ms
Lead Channel Setting Pacing Amplitude: 1.2 V
Lead Channel Setting Pacing Amplitude: 2.4 V
Lead Channel Setting Pacing Pulse Width: 0.4 ms
Lead Channel Setting Sensing Sensitivity: 3 mV
Pulse Gen Serial Number: 391753

## 2017-06-12 ENCOUNTER — Other Ambulatory Visit: Payer: Self-pay | Admitting: Internal Medicine

## 2017-06-12 DIAGNOSIS — K219 Gastro-esophageal reflux disease without esophagitis: Secondary | ICD-10-CM

## 2017-06-21 ENCOUNTER — Other Ambulatory Visit: Payer: Self-pay | Admitting: Internal Medicine

## 2017-06-24 ENCOUNTER — Ambulatory Visit: Payer: Medicare Other | Admitting: Internal Medicine

## 2017-06-24 ENCOUNTER — Encounter

## 2017-06-24 ENCOUNTER — Encounter: Payer: Self-pay | Admitting: Internal Medicine

## 2017-06-24 ENCOUNTER — Other Ambulatory Visit: Payer: Self-pay | Admitting: Internal Medicine

## 2017-06-24 ENCOUNTER — Other Ambulatory Visit (INDEPENDENT_AMBULATORY_CARE_PROVIDER_SITE_OTHER): Payer: Medicare Other

## 2017-06-24 ENCOUNTER — Telehealth: Payer: Self-pay | Admitting: Internal Medicine

## 2017-06-24 VITALS — BP 130/78 | HR 69 | Temp 98.1°F | Ht 64.5 in | Wt 182.0 lb

## 2017-06-24 DIAGNOSIS — I10 Essential (primary) hypertension: Secondary | ICD-10-CM

## 2017-06-24 DIAGNOSIS — E781 Pure hyperglyceridemia: Secondary | ICD-10-CM

## 2017-06-24 DIAGNOSIS — I480 Paroxysmal atrial fibrillation: Secondary | ICD-10-CM

## 2017-06-24 DIAGNOSIS — R300 Dysuria: Secondary | ICD-10-CM | POA: Diagnosis not present

## 2017-06-24 DIAGNOSIS — E785 Hyperlipidemia, unspecified: Secondary | ICD-10-CM | POA: Diagnosis not present

## 2017-06-24 DIAGNOSIS — F5104 Psychophysiologic insomnia: Secondary | ICD-10-CM

## 2017-06-24 DIAGNOSIS — K219 Gastro-esophageal reflux disease without esophagitis: Secondary | ICD-10-CM

## 2017-06-24 LAB — BASIC METABOLIC PANEL
BUN: 20 mg/dL (ref 6–23)
CO2: 32 mEq/L (ref 19–32)
Calcium: 9.4 mg/dL (ref 8.4–10.5)
Chloride: 104 mEq/L (ref 96–112)
Creatinine, Ser: 0.96 mg/dL (ref 0.40–1.20)
GFR: 58.15 mL/min — ABNORMAL LOW (ref >60.00)
Glucose, Bld: 104 mg/dL — ABNORMAL HIGH (ref 70–99)
Potassium: 4.3 mEq/L (ref 3.5–5.1)
Sodium: 141 mEq/L (ref 135–145)

## 2017-06-24 LAB — CBC WITH DIFFERENTIAL/PLATELET
Basophils Absolute: 0 10*3/uL (ref 0.0–0.1)
Basophils Relative: 0.5 % (ref 0.0–3.0)
Eosinophils Absolute: 0.2 10*3/uL (ref 0.0–0.7)
Eosinophils Relative: 2.6 % (ref 0.0–5.0)
HCT: 43.6 % (ref 36.0–46.0)
Hemoglobin: 14.6 g/dL (ref 12.0–15.0)
Lymphocytes Relative: 27.1 % (ref 12.0–46.0)
Lymphs Abs: 1.9 10*3/uL (ref 0.7–4.0)
MCHC: 33.6 g/dL (ref 30.0–36.0)
MCV: 94.9 fl (ref 78.0–100.0)
Monocytes Absolute: 0.7 10*3/uL (ref 0.1–1.0)
Monocytes Relative: 9.8 % (ref 3.0–12.0)
Neutro Abs: 4.2 10*3/uL (ref 1.4–7.7)
Neutrophils Relative %: 60 % (ref 43.0–77.0)
Platelets: 212 10*3/uL (ref 150.0–400.0)
RBC: 4.59 Mil/uL (ref 3.87–5.11)
RDW: 13.6 % (ref 11.5–15.5)
WBC: 7 10*3/uL (ref 4.0–10.5)

## 2017-06-24 LAB — URINALYSIS, ROUTINE W REFLEX MICROSCOPIC
Bilirubin Urine: NEGATIVE
Ketones, ur: NEGATIVE
Nitrite: NEGATIVE
RBC / HPF: NONE SEEN (ref 0–?)
Specific Gravity, Urine: 1.02 (ref 1.000–1.030)
Total Protein, Urine: NEGATIVE
Urine Glucose: NEGATIVE
Urobilinogen, UA: 0.2 (ref 0.0–1.0)
pH: 5.5 (ref 5.0–8.0)

## 2017-06-24 LAB — LIPID PANEL
Cholesterol: 159 mg/dL (ref 0–200)
HDL: 42.1 mg/dL (ref >39.00)
LDL Cholesterol: 80 mg/dL (ref 0–99)
NonHDL: 117.25
Total CHOL/HDL Ratio: 4
Triglycerides: 187 mg/dL — ABNORMAL HIGH (ref 0.0–149.0)
VLDL: 37.4 mg/dL (ref 0.0–40.0)

## 2017-06-24 LAB — TSH: TSH: 1.96 u[IU]/mL (ref 0.35–4.50)

## 2017-06-24 MED ORDER — TRAZODONE HCL 50 MG PO TABS: 50.0000 mg | ORAL_TABLET | Freq: Every evening | ORAL | 1 refills | Status: DC | PRN

## 2017-06-24 MED ORDER — RANITIDINE HCL 300 MG PO TABS: 300.0000 mg | ORAL_TABLET | Freq: Every day | ORAL | 1 refills | Status: DC

## 2017-06-24 MED ORDER — PRAVASTATIN SODIUM 20 MG PO TABS: 20.0000 mg | ORAL_TABLET | Freq: Every day | ORAL | 1 refills | Status: DC

## 2017-06-24 MED ORDER — SPIRONOLACTONE 25 MG PO TABS: 25.0000 mg | ORAL_TABLET | Freq: Every day | ORAL | 1 refills | Status: DC

## 2017-06-24 NOTE — Progress Notes (Signed)
Subjective:  Patient ID: Deborah Chung, female    DOB: [birth year]  Age: 84 y.o. MRN: [id number]  CC: Atrial Fibrillation; Hypertension; Hyperlipidemia; and Gastroesophageal Reflux   HPI Deborah Chung presents for f/up - She complains of chronic insomnia with frequent awakenings.  She has some low back discomfort that wakes her up during the night.  Sometimes she cannot fall back to sleep.  She has tried Benadryl but it makes her feel foggy the next day.  She wants to try different option.  She also complains of a several week of dysuria without hematuria, frequency, or pelvic pain.  Outpatient Medications Prior to Visit  Medication Sig Dispense Refill  . ALPRAZolam (XANAX) 0.25 MG tablet TAKE 1 TABLET BY MOUTH AS NEEDED FOR ANXIETY 30 tablet 2  . cetirizine (ZYRTEC) 10 MG tablet TAKE 1 TABLET BY MOUTH DAILY 30 tablet 11  . CREON 12000 units CPEP capsule Take 2 capsules by mouth 3 (three) times daily.  3  . ELIQUIS 2.5 MG TABS tablet TAKE 1 TABLET BY MOUTH TWICE A DAY 180 tablet 1  . LYRICA 75 MG capsule TAKE ONE CAPSULE BY MOUTH 3 TIMES A DAY 270 capsule 1  . metoprolol tartrate (LOPRESSOR) 25 MG tablet TAKE 1 TABLET BY MOUTH TWICE A DAY 180 tablet 1  . Omega-3 Fatty Acids (OMEGA-3 FISH OIL PO) Take by mouth.    Marland Kitchen aspirin 81 MG tablet Take 81 mg by mouth daily.    . Biotin 10 MG TABS Take 1 tablet by mouth daily.    . DiphenhydrAMINE HCl (BENADRYL ALLERGY PO) Take 1 tablet by mouth as needed.    . pravastatin (PRAVACHOL) 20 MG tablet Take 1 tablet (20 mg total) by mouth daily. 90 tablet 1  . ranitidine (ZANTAC) 300 MG tablet TAKE 1 TABLET BY MOUTH AT BEDTIME 90 tablet 3  . spironolactone (ALDACTONE) 25 MG tablet TAKE 1 TABLET (25 MG TOTAL) BY MOUTH DAILY. 90 tablet 1  . diphenoxylate-atropine (LOMOTIL) 2.5-0.025 MG tablet TAKE 1 TABLET BY MOUTH TWICE A DAY (Patient not taking: Reported on 06/24/2017) 60 tablet 1  . Probiotic Product (PROBIOTIC PO) Take 1 tablet by mouth daily.    Marland Kitchen ELIQUIS  2.5 MG TABS tablet TAKE 1 TABLET (2.5 MG TOTAL) BY MOUTH 2 (TWO) TIMES DAILY. (Patient not taking: Reported on 06/24/2017) 180 tablet 1   No facility-administered medications prior to visit.     ROS Review of Systems  Constitutional: Negative for appetite change, diaphoresis, fatigue and unexpected weight change.  HENT: Negative.  Negative for trouble swallowing.   Eyes: Negative for visual disturbance.  Respiratory: Negative for cough, chest tightness, shortness of breath and wheezing.   Cardiovascular: Negative for chest pain, palpitations and leg swelling.  Gastrointestinal: Negative for abdominal pain, diarrhea, nausea and vomiting.  Endocrine: Negative.   Genitourinary: Positive for dysuria. Negative for decreased urine volume, difficulty urinating, flank pain, frequency, hematuria and urgency.  Musculoskeletal: Negative.  Negative for arthralgias, back pain, myalgias and neck pain.  Skin: Negative.  Negative for color change and pallor.  Neurological: Negative.  Negative for dizziness, weakness and light-headedness.  Hematological: Negative for adenopathy. Does not bruise/bleed easily.  Psychiatric/Behavioral: Negative.     Objective:  BP 130/78 (BP Location: Left Arm, Patient Position: Sitting, Cuff Size: Normal)   Pulse 69   Temp 98.1 F (36.7 C) (Oral)   Ht 5' 4.5" (1.638 m)   Wt 182 lb (82.6 kg)   SpO2 95%   BMI 30.76 kg/m  BP Readings from Last 3 Encounters:  06/24/17 130/78  11/27/16 128/60  09/02/16 (!) 118/58    Wt Readings from Last 3 Encounters:  06/24/17 182 lb (82.6 kg)  11/27/16 180 lb (81.6 kg)  09/02/16 177 lb (80.3 kg)    Physical Exam  Constitutional: She is oriented to person, place, and time. No distress.  HENT:  Mouth/Throat: Oropharynx is clear and moist. No oropharyngeal exudate.  Eyes: Conjunctivae are normal. No scleral icterus.  Neck: Normal range of motion. Neck supple. No JVD present. No thyromegaly present.  Cardiovascular: Normal  rate, regular rhythm and normal heart sounds. Exam reveals no gallop.  No murmur heard. Pulmonary/Chest: Effort normal and breath sounds normal. No respiratory distress. She has no wheezes. She has no rales.  Abdominal: Soft. Bowel sounds are normal. She exhibits no mass. There is no hepatosplenomegaly. There is no tenderness.  Musculoskeletal: Normal range of motion. She exhibits no edema, tenderness or deformity.  Lymphadenopathy:    She has no cervical adenopathy.  Neurological: She is alert and oriented to person, place, and time.  Skin: Skin is warm and dry. She is not diaphoretic. No pallor.  Psychiatric: She has a normal mood and affect. Her behavior is normal. Judgment and thought content normal.  Vitals reviewed.   Lab Results  Component Value Date   WBC 7.0 06/24/2017   HGB 14.6 06/24/2017   HCT 43.6 06/24/2017   PLT 212.0 06/24/2017   GLUCOSE 104 (H) 06/24/2017   CHOL 159 06/24/2017   TRIG 187.0 (H) 06/24/2017   HDL 42.10 06/24/2017   LDLDIRECT 106.0 05/16/2014   LDLCALC 80 06/24/2017   ALT 16 10/02/2015   AST 21 10/02/2015   NA 141 06/24/2017   K 4.3 06/24/2017   CL 104 06/24/2017   CREATININE 0.96 06/24/2017   BUN 20 06/24/2017   CO2 32 06/24/2017   TSH 1.96 06/24/2017   HGBA1C 5.6 05/30/2015    Ct Abdomen Pelvis W Contrast  Result Date: 09/17/2015 CLINICAL DATA:  84 year old female with abdominal pain and diarrhea. EXAM: CT ABDOMEN AND PELVIS WITH CONTRAST TECHNIQUE: Multidetector CT imaging of the abdomen and pelvis was performed using the standard protocol following bolus administration of intravenous contrast. CONTRAST:  43mL ISOVUE-300 IOPAMIDOL (ISOVUE-300) INJECTION 61% COMPARISON:  None. FINDINGS: The visualized lung bases appear clear. Cardiac pacemaker wires noted. No intra-abdominal free air or free fluid. Subcentimeter scattered hepatic hypodensities are too small to characterize but likely represent cysts or hemangioma. The liver is otherwise  unremarkable with there is no calcified gallstone or pericholecystic fluid. The common bile duct is within upper limits of normal and measures 9 mm in diameter. No CBD stone identified. Scattered coarse pancreatic calcifications sequela of chronic pancreatitis. No acute inflammatory changes or pancreatic duct dilatation. The spleen, adrenal glands appear unremarkable. Mild bilateral renal atrophy. An 11 mm right renal interpolar all hypodensity most likely a cyst. There is no hydronephrosis on either side. The visualized ureters and urinary bladder appear unremarkable. Hysterectomy. There is sigmoid diverticulosis with muscular hypertrophy. No active inflammatory changes. There is no evidence of bowel obstruction or active inflammation. Normal appendix. There is moderate aortoiliac atherosclerotic disease. The origins of the celiac axis, SMA, IMA as well as the origins of the renal arteries appear patent. There are atherosclerotic calcification of the origins of the celiac axis and renal arteries. The SMV, splenic vein, and main portal vein are patent. No portal venous gas identified. There is no adenopathy. The abdominal wall soft tissues appear unremarkable.  There is osteopenia with degenerative changes of the spine. No acute fracture. Multilevel disc desiccation with vacuum phenomenon. IMPRESSION: No acute intra-abdominal or pelvic pathology. No evidence of bowel obstruction or active inflammation. Sigmoid diverticulosis. Electronically Signed   By: Elgie Collard M.D.   On: 09/17/2015 20:45    Assessment & Plan:   Adalaya was seen today for atrial fibrillation, hypertension, hyperlipidemia and gastroesophageal reflux.  Diagnoses and all orders for this visit:  Dysuria- Her UA is normal.  If her urine culture is positive then I will consider prescribing an antibiotic. -     Urinalysis, Routine w reflex microscopic; Future -     CULTURE, URINE COMPREHENSIVE; Future  Essential hypertension, benign-  Her blood pressure is well controlled.  Electrolytes and renal function are normal.  Will continue the current regimen. -     CBC with Differential/Platelet; Future -     Basic metabolic panel; Future -     spironolactone (ALDACTONE) 25 MG tablet; Take 1 tablet (25 mg total) by mouth daily.  Paroxysmal atrial fibrillation (HCC)- She has good rate and rhythm control.  Will continue anticoagulation with Eliquis. -     TSH; Future  Hyperlipidemia with target LDL less than 100- She has achieved her LDL goal and is doing well on the statin. -     Lipid panel; Future -     pravastatin (PRAVACHOL) 20 MG tablet; Take 1 tablet (20 mg total) by mouth daily.  Hypertriglyceridemia- Improvement noted -     Lipid panel; Future  Gastroesophageal reflux disease without esophagitis- Her symptoms are well controlled with the H2 blocker. -     ranitidine (ZANTAC) 300 MG tablet; Take 1 tablet (300 mg total) by mouth at bedtime. -     CBC with Differential/Platelet; Future  Psychophysiological insomnia -     traZODone (DESYREL) 50 MG tablet; Take 1 tablet (50 mg total) by mouth at bedtime as needed for sleep.   I have discontinued Kevona Faciane's aspirin, Biotin, and DiphenhydrAMINE HCl (BENADRYL ALLERGY PO). I have also changed her ranitidine. Additionally, I am having her start on traZODone. Lastly, I am having her maintain her Probiotic Product (PROBIOTIC PO), cetirizine, LYRICA, ALPRAZolam, metoprolol tartrate, diphenoxylate-atropine, ELIQUIS, CREON, Omega-3 Fatty Acids (OMEGA-3 FISH OIL PO), spironolactone, and pravastatin.  Meds ordered this encounter  Medications  . ranitidine (ZANTAC) 300 MG tablet    Sig: Take 1 tablet (300 mg total) by mouth at bedtime.    Dispense:  90 tablet    Refill:  1  . traZODone (DESYREL) 50 MG tablet    Sig: Take 1 tablet (50 mg total) by mouth at bedtime as needed for sleep.    Dispense:  90 tablet    Refill:  1  . spironolactone (ALDACTONE) 25 MG tablet    Sig:  Take 1 tablet (25 mg total) by mouth daily.    Dispense:  90 tablet    Refill:  1  . pravastatin (PRAVACHOL) 20 MG tablet    Sig: Take 1 tablet (20 mg total) by mouth daily.    Dispense:  90 tablet    Refill:  1     Follow-up: Return in about 6 months (around 12/24/2017).  Sanda Linger, MD

## 2017-06-24 NOTE — Patient Instructions (Signed)

## 2017-06-24 NOTE — Telephone Encounter (Signed)
Copied from CRM [id number]. Topic: Quick Communication - Rx Refill/Question >> Jun 24, 2017  4:23 PM Gean Birchwood R wrote: Medication: spironolactone (ALDACTONE) 25 MG tablet  Has the patient contacted their pharmacy? Yes.   (Agent: If no, request that the patient contact the pharmacy for the refill.) (Agent: If yes, when and what did the pharmacy advise?)  Preferred Pharmacy (with phone number or street name): [id number] - [place], [place] - [address] [phone] (Phone) [phone] (Fax)      Agent: Please be advised that RX refills may take up to 3 business days. We ask that you follow-up with your pharmacy.

## 2017-06-25 NOTE — Telephone Encounter (Signed)
Filled, at [facility]

## 2017-06-26 LAB — CULTURE, URINE COMPREHENSIVE
MICRO NUMBER:: 90699434
RESULT:: NO GROWTH
SPECIMEN QUALITY:: ADEQUATE

## 2017-07-07 ENCOUNTER — Other Ambulatory Visit: Payer: Self-pay | Admitting: Internal Medicine

## 2017-07-07 DIAGNOSIS — F411 Generalized anxiety disorder: Secondary | ICD-10-CM

## 2017-07-09 ENCOUNTER — Telehealth: Payer: Self-pay | Admitting: Gastroenterology

## 2017-07-09 NOTE — Telephone Encounter (Signed)
Pt would like to speak with you regarding her progress.

## 2017-07-09 NOTE — Telephone Encounter (Signed)
The pt has used miralax and has had no issues until last night when she developed diarrhea.  She was advised to stop the miralax and use lomotil as directed unless she becomes constipated.  She will call back if she has no relief with recommendations and a follow up was made for 9/4 with Dr Christella Hartigan

## 2017-08-27 ENCOUNTER — Ambulatory Visit (INDEPENDENT_AMBULATORY_CARE_PROVIDER_SITE_OTHER): Payer: Medicare Other | Admitting: *Deleted

## 2017-08-27 DIAGNOSIS — I480 Paroxysmal atrial fibrillation: Secondary | ICD-10-CM | POA: Diagnosis not present

## 2017-08-27 NOTE — Progress Notes (Signed)
Remote pacemaker transmission.   

## 2017-09-10 ENCOUNTER — Ambulatory Visit: Payer: Medicare Other | Admitting: Internal Medicine

## 2017-09-10 ENCOUNTER — Encounter: Payer: Self-pay | Admitting: Internal Medicine

## 2017-09-10 VITALS — BP 110/60 | HR 70 | Ht 64.0 in | Wt 180.0 lb

## 2017-09-10 DIAGNOSIS — I480 Paroxysmal atrial fibrillation: Secondary | ICD-10-CM | POA: Diagnosis not present

## 2017-09-10 DIAGNOSIS — Z950 Presence of cardiac pacemaker: Secondary | ICD-10-CM | POA: Diagnosis not present

## 2017-09-10 DIAGNOSIS — I10 Essential (primary) hypertension: Secondary | ICD-10-CM

## 2017-09-10 DIAGNOSIS — I428 Other cardiomyopathies: Secondary | ICD-10-CM

## 2017-09-10 NOTE — Progress Notes (Signed)
OFFICE NOTE  Chief Complaint:  No complaints  Primary Care Physician: Etta Grandchild, MD  HPI:  Deborah Chung is a pleasant 84 year old female who recently moved to [place] from [place]. She was apparently living in [place], somewhere outside of [place]. Her son is Lyriq Flamand, who is the in-house counsel for the [employer] [facility] system. She was previously seeing [facility] and had an episode in February of 2015. Apparently she had a mobile health screening and was found to be out of rhythm. She was sent to her primary care doctor who did an EKG and noted she was in A. fib and was sent immediately to the hospital. As the story goes, she was on several treatments for her A. fib and apparently eventually underwent cardiac catheterization and/or possibly an ablation procedure for which she developed asystole or some type of arrhythmia requiring her to be resuscitated. Obviously this was successful and she received apparently a pacemaker. She is not clear as to what the type of pacemaker wasn't did not provide documentation today. I've not yet received any records from her cardiologist in [place]. She was placed on Eliquis for anticoagulation. In addition, it appears she is on digoxin and Aldactone along with Lopressor which is suggestive of possible cardiomyopathy. She apparently does have a heart failure diagnosis however I'm not certain as to what her ejection fraction is. She reports a remote check device for her pacemaker and did a remote check apparently last week which went to [place]. Symptomatically she denies any chest pain or shortness of breath.  I saw Deborah Chung back today in the office. She is doing fairly well. She saw Dr. Lewayne Bunting in November who performed a pacemaker check and felt that she was doing well in sinus rhythm. There is no evidence for recurrent atrial fibrillation. She has yet to be enrolled in home monitoring. Twice she has  requested a home monitor but it has not been yet sent to her. I will need to look into why she has not received her home monitoring device. 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 Eyes a chest pain or worsening shortness of breath. As mentioned recent laboratory work from her primary shows excellent cholesterol control with total cholesterol 174, triglycerides 124, HDL 59 and LDL of 90.  At the pleasure seeing Deborah Chung back in the office today. She reports doing fairly well. She still has some issues with swelling in her legs and discomfort with achiness and heaviness. She's had bilateral knee surgeries and is noted to have some varicose veins as well as some discoloration over her ankles. From a cardiac standpoint she had an echo which shows preserved systolic function, this represents a marked improvement since her EF was reported around 10% at one point but this may been after cardiac arrest when she got her pacemaker placed. Pacemaker function appears to be normal. She's had little if any atrial fibrillation and is on Eliquis for a CHADSVASC score of 4.  I saw Deborah Chung back today in the office. Overall she is feeling well. She denies any worsening shortness of breath or swelling. EF is now normalized. She follows with Dr. Ladona Ridgel for pacemaker. She's had no bleeding problems on Eliquis. She is on low-dose digoxin which will require monitoring. She is overdue for cholesterol check.  08/21/2015  Deborah Chung returns today for follow-up. In the interim she had a pacer remote check with a stable burden of atrial fibrillation. She continues  to do well on Eliquis. We discontinued her digoxin and her last office visit for a borderline elevated digoxin level and the fact that it is really not indicated for A. fib and her congestive heart failure has improved with normal LV function. Blood pressure is well-controlled today. Her only other complaint is that she's had diarrhea for approximately 7 days.  There is no fever or chills associated with that. He came on abruptly. She denied any sick contacts. She is using Imodium for this.  03/05/2016  Deborah Chung returns today for follow-up. Overall she feels well. She had recent adjustment in her pacemaker when she saw Dr. Ladona Ridgel and is atrial paced today is 70. Blood pressure is normal 118/62. She denies any bleeding problems on L a course. She is not aware of her A. fib. Recently she had a bout with diarrhea was found to have a pancreatic insufficiency. She is on Creon.  09/03/2078  Deborah Chung was seen today in follow-up. Her main complaint is bilateral knee pain. She has a history of knee replacement the past but her surgeon now lives in [place]. She's hesitant to do anything different about it. She's not as active as she's been in the past. She continues to have some problems with pancreatic insufficiency and pain associated with that. She denies any chest pain or worsening shortness of breath. Her pacemaker remote checks of been stable. The last was in May which showed a very low burden of A. fib of only 2%.  09/10/2017  Deborah Chung is seen today in follow-up.  Overall she has no significant complaints.  She has had a well-functioning pacemaker which has been followed by Dr. Ladona Ridgel.  Remote pacer checks have shown normal findings.  She still struggles with some GI issues and is followed by Dr. Christella Hartigan.  Blood pressure is well controlled today 110/60.  EKG shows an atrial paced rhythm.  She denies any symptomatic atrial fibrillation.  She has had no bleeding problems on Eliquis and denies any frequent falls or head injuries.  PMHx:  Past Medical History:  Diagnosis Date  . Anxiety   . Atrial fibrillation (HCC)   . Blood transfusion without reported diagnosis   . GAD (generalized anxiety disorder)   . Hearing loss   . Hyperlipidemia   . Hypertension   . PHN (postherpetic neuralgia) 2010  . Systolic CHF [facility])     Past Surgical History:    Procedure Laterality Date  . ABDOMINAL HYSTERECTOMY    . PACEMAKER PLACEMENT    . TOTAL KNEE ARTHROPLASTY Bilateral     FAMHx:  Family History  Problem Relation Age of Onset  . Heart disease Father   . Heart disease Sister     SOCHx:   reports that she quit smoking about 29 years ago. She quit after 25.00 years of use. She has never used smokeless tobacco. She reports that she drinks alcohol. She reports that she does not use drugs.  ALLERGIES:  No Known Allergies  ROS: Pertinent items noted in HPI and remainder of comprehensive ROS otherwise negative.  HOME MEDS: Current Outpatient Medications  Medication Sig Dispense Refill  . ALPRAZolam (XANAX) 0.25 MG tablet TAKE 1 TABLET BY MOUTH AS NEEDED FOR ANXIETY 30 tablet 5  . aspirin 81 MG tablet Take 81 mg by mouth daily.    Marland Kitchen CREON 12000 units CPEP capsule Take 2 capsules by mouth 3 (three) times daily.  3  . DIPHENHYDRAMINE HCL PO Take 25 mg by mouth every 6 (  six) hours as needed.    Marland Kitchen ELIQUIS 2.5 MG TABS tablet TAKE 1 TABLET BY MOUTH TWICE A DAY 180 tablet 1  . LYRICA 75 MG capsule TAKE ONE CAPSULE BY MOUTH 3 TIMES A DAY 270 capsule 1  . metoprolol tartrate (LOPRESSOR) 25 MG tablet TAKE 1 TABLET BY MOUTH TWICE A DAY 180 tablet 1  . Omega-3 Fatty Acids (OMEGA-3 FISH OIL PO) Take by mouth.    . pravastatin (PRAVACHOL) 20 MG tablet Take 1 tablet (20 mg total) by mouth daily. 90 tablet 1  . Probiotic Product (PROBIOTIC PO) Take 1 tablet by mouth daily.    . ranitidine (ZANTAC) 300 MG tablet Take 1 tablet (300 mg total) by mouth at bedtime. 90 tablet 1  . spironolactone (ALDACTONE) 25 MG tablet Take 1 tablet (25 mg total) by mouth daily. 90 tablet 1  . traZODone (DESYREL) 50 MG tablet Take 1 tablet (50 mg total) by mouth at bedtime as needed for sleep. 90 tablet 1  . cetirizine (ZYRTEC) 10 MG tablet TAKE 1 TABLET BY MOUTH DAILY (Patient not taking: Reported on 09/10/2017) 30 tablet 11  . diphenoxylate-atropine (LOMOTIL) 2.5-0.025 MG  tablet TAKE 1 TABLET BY MOUTH TWICE A DAY (Patient not taking: Reported on 06/24/2017) 60 tablet 1   No current facility-administered medications for this visit.     LABS/IMAGING: No results found for this or any previous visit (from the past 48 hour(s)). No results found.  VITALS: BP 110/60 (BP Location: Right Arm, Patient Position: Sitting, Cuff Size: Normal)   Pulse 70   Ht 5\' 4"  (1.626 m)   Wt 180 lb (81.6 kg)   BMI 30.90 kg/m   EXAM: General appearance: alert and no distress Neck: no carotid bruit and no JVD Lungs: clear to auscultation bilaterally and pacemaker in well-healed pocket in the left upper chest Heart: regular rate and rhythm, S1, S2 normal, no murmur, click, rub or gallop Abdomen: soft, non-tender; bowel sounds normal; no masses,  no organomegaly Extremities: extremities normal, atraumatic, no cyanosis or edema, varicose veins noted and Shiny skin without pitting edema Pulses: 2+ and symmetric Skin: Skin color, texture, turgor normal. No rashes or lesions Neurologic: Grossly normal Psych: NOrmal  EKG: Atrial paced rhythm at 70-personally reviewed  ASSESSMENT: 1. History of atrial fibrillation on anticoagulation with Eliquis (CHADVASC 4) 2. Hypertension 3. Dyslipidemia 4. Status post pacemaker - for sinus node dysfunction and complete heart block 5. Reported history of EF of 10-15%, now improved to 55-60%  PLAN: 1.  Deborah Chung has a low burden of A. fib on Eliquis.  Her pacemaker is appropriately functioning.  Her LVEF is normalized.  Her blood pressure is well controlled.  We will continue her current medicines.  She endorses only NYHA class I-II symptoms.  Follow-up with me in 6 months or sooner as necessary.  Chrystie Nose, MD, [facility], FACP  [place]  [facility]  Medical Director of the [facility] Lipid Disorders &  Cardiovascular Risk Reduction Clinic Diplomate of the American Board of Clinical Lipidology Attending Cardiologist  Direct Dial:  [phone]  Fax: [phone]  Website:  [email or web address] Nicely Deborah Chung 09/10/2017, 2:09 PM

## 2017-09-10 NOTE — Patient Instructions (Signed)
Your physician wants you to follow-up in: 6 months with Dr. Hilty. You will receive a reminder letter in the mail two months in advance. If you don't receive a letter, please call our office to schedule the follow-up appointment.    

## 2017-09-11 ENCOUNTER — Encounter: Payer: Self-pay | Admitting: Internal Medicine

## 2017-09-17 ENCOUNTER — Ambulatory Visit: Payer: Medicare Other | Admitting: Gastroenterology

## 2017-09-17 ENCOUNTER — Encounter: Payer: Self-pay | Admitting: Gastroenterology

## 2017-09-17 VITALS — BP 118/68 | HR 62 | Ht 64.0 in | Wt 180.0 lb

## 2017-09-17 DIAGNOSIS — K5900 Constipation, unspecified: Secondary | ICD-10-CM

## 2017-09-17 DIAGNOSIS — K8689 Other specified diseases of pancreas: Secondary | ICD-10-CM

## 2017-09-17 DIAGNOSIS — R1310 Dysphagia, unspecified: Secondary | ICD-10-CM

## 2017-09-17 DIAGNOSIS — R197 Diarrhea, unspecified: Secondary | ICD-10-CM

## 2017-09-17 NOTE — Patient Instructions (Addendum)
Try taking a gas-ex pill with every breakfast meal. Please return to see Dr. Christella Hartigan in 1 year, sooner if any problems.  Continue adjusting your meds as you described in the office today.  Normal BMI (Body Mass Index- based on height and weight) is between 23 and 30. Your BMI today is Body mass index is 30.9 kg/m. Marland Kitchen Please consider follow up  regarding your BMI with your Primary Care Provider.

## 2017-09-17 NOTE — Progress Notes (Signed)
Review of pertinent gastrointestinal problems: 1. Presumed pancreatic insufficiency from chronic pancreatitis.CT scan 09/2015"The liver is otherwise unremarkable with there is no calcified gallstone or pericholecystic fluid. The common bile duct is within upper limits of normal and measures 9 mm in diameter. No CBD stone identified. Scattered coarse pancreatic calcifications sequela of chronic pancreatitis." Labs 2017:shows celiac sprue testing negative, GI pathogen panel was negative, fecal lactoferrin was positive, CBC and complete metabolic profile were both normal.   HPI: This is a very pleasant 84 year old woman whom I last saw a little over a year ago.  She has chronic calcific pancreatitis based on imaging.  This is of unclear etiology.  She never had acute pancreatitis and was never a big alcohol drinker.  This was discovered incidentally in 2017.  It may be contributing to somewhat erratic bowel habits.  She can really swing from constipation or diarrhea.  In the past year she has been taking Creon it regularly.  She takes Lomotil if she swings to the looser side and takes MiraLAX if she swings to the more constipated side.  She wanted to make sure that that is safe that she take those on a as needed basis.  Gas worse in the PM.  Belching and flatulence   Labs June 2019 show normal CBC, normal TSH, essentially normal basic metabolic profile   Chief complaint is dyspepsia, constipation, diarrhea  ROS: complete GI ROS as described in HPI, all other review negative.  Constitutional:  No unintentional weight loss (wieght is up 5 puonds since last visit over a year ago)   Past Medical History:  Diagnosis Date  . Anxiety   . Atrial fibrillation (HCC)   . Blood transfusion without reported diagnosis   . GAD (generalized anxiety disorder)   . Hearing loss   . Hyperlipidemia   . Hypertension   . PHN (postherpetic neuralgia) 2010  . Systolic CHF [facility])     Past Surgical  History:  Procedure Laterality Date  . ABDOMINAL HYSTERECTOMY    . PACEMAKER PLACEMENT    . TOTAL KNEE ARTHROPLASTY Bilateral     Current Outpatient Medications  Medication Sig Dispense Refill  . ALPRAZolam (XANAX) 0.25 MG tablet TAKE 1 TABLET BY MOUTH AS NEEDED FOR ANXIETY 30 tablet 5  . aspirin 81 MG tablet Take 81 mg by mouth daily.    Marland Kitchen BIOTIN PO Take by mouth daily.    Marland Kitchen CREON 12000 units CPEP capsule Take 2 capsules by mouth 3 (three) times daily.  3  . diphenhydrAMINE HCl (BENADRYL ALLERGY PO) Take by mouth 2 (two) times daily.    Marland Kitchen DIPHENHYDRAMINE HCL PO Take 25 mg by mouth every 6 (six) hours as needed.    . diphenoxylate-atropine (LOMOTIL) 2.5-0.025 MG tablet TAKE 1 TABLET BY MOUTH TWICE A DAY 60 tablet 1  . ELIQUIS 2.5 MG TABS tablet TAKE 1 TABLET BY MOUTH TWICE A DAY 180 tablet 1  . LYRICA 75 MG capsule TAKE ONE CAPSULE BY MOUTH 3 TIMES A DAY 270 capsule 1  . metoprolol tartrate (LOPRESSOR) 25 MG tablet TAKE 1 TABLET BY MOUTH TWICE A DAY 180 tablet 1  . Omega-3 Fatty Acids (OMEGA-3 FISH OIL PO) Take by mouth.    . pravastatin (PRAVACHOL) 20 MG tablet Take 1 tablet (20 mg total) by mouth daily. 90 tablet 1  . Probiotic Product (PROBIOTIC PO) Take 1 tablet by mouth daily.    . ranitidine (ZANTAC) 300 MG tablet Take 1 tablet (300 mg total) by mouth at  bedtime. 90 tablet 1  . spironolactone (ALDACTONE) 25 MG tablet Take 1 tablet (25 mg total) by mouth daily. 90 tablet 1  . traZODone (DESYREL) 50 MG tablet Take 1 tablet (50 mg total) by mouth at bedtime as needed for sleep. 90 tablet 1   No current facility-administered medications for this visit.     Allergies as of 09/17/2017  . (No Known Allergies)    Family History  Problem Relation Age of Onset  . Heart disease Father   . Heart disease Sister     Social History   Socioeconomic History  . Marital status: Unknown    Spouse name: Not on file  . Number of children: 3  . Years of education: Not on file  . Highest  education level: Not on file  Occupational History  . Occupation: retired  Engineer, production  . Financial resource strain: Not on file  . Food insecurity:    Worry: Not on file    Inability: Not on file  . Transportation needs:    Medical: Not on file    Non-medical: Not on file  Tobacco Use  . Smoking status: Former Smoker    Years: 25.00    Last attempt to quit: 08/03/1988    Years since quitting: 29.1  . Smokeless tobacco: Never Used  Substance and Sexual Activity  . Alcohol use: Yes  . Drug use: No  . Sexual activity: Not Currently  Lifestyle  . Physical activity:    Days per week: Not on file    Minutes per session: Not on file  . Stress: Not on file  Relationships  . Social connections:    Talks on phone: Not on file    Gets together: Not on file    Attends religious service: Not on file    Active member of club or organization: Not on file    Attends meetings of clubs or organizations: Not on file    Relationship status: Not on file  . Intimate partner violence:    Fear of current or ex partner: Not on file    Emotionally abused: Not on file    Physically abused: Not on file    Forced sexual activity: Not on file  Other Topics Concern  . Not on file  Social History Narrative  . Not on file     Physical Exam: Ht 5\' 4"  (1.626 m)   Wt 180 lb (81.6 kg)   BMI 30.90 kg/m  Constitutional: generally well-appearing Psychiatric: alert and oriented x3 Abdomen: soft, nontender, nondistended, no obvious ascites, no peritoneal signs, normal bowel sounds No peripheral edema noted in lower extremities  Assessment and plan: 84 y.o. female with dyspepsia, constipation, diarrhea, incidental chronic pancreatitis and possible underlying pancreatic insufficiency  Fortunately she is gaining weight and so I think she certainly does not have significant chronic pancreatic insufficiency.  She has erratic, alternating bowel habits and knows that she is free to adjust her Lomotil and  MiraLAX dosing in small increments as she sees fit.  She does have a lot of belching and dyspepsia as well as flatulence especially later in the afternoon so I recommended she try taking a single Gas-X pill with every morning meal.  She will return to see me in a year and sooner if any problems.  Please see the "Patient Instructions" section for addition details about the plan.  Rob Bunting, MD [facility] 09/17/2017, 3:30 PM

## 2017-09-22 LAB — CUP PACEART REMOTE DEVICE CHECK
Date Time Interrogation Session: 20190909144743
Implantable Lead Implant Date: 20150112
Implantable Lead Implant Date: 20150112
Implantable Lead Location: 753859
Implantable Lead Location: 753860
Implantable Lead Model: 4135
Implantable Lead Model: 4136
Implantable Lead Serial Number: 29308444
Implantable Lead Serial Number: 29569556
Implantable Pulse Generator Implant Date: 20150112
Pulse Gen Serial Number: 391753

## 2017-10-15 ENCOUNTER — Other Ambulatory Visit: Payer: Self-pay | Admitting: Internal Medicine

## 2017-11-26 ENCOUNTER — Ambulatory Visit (INDEPENDENT_AMBULATORY_CARE_PROVIDER_SITE_OTHER): Payer: Medicare Other | Admitting: *Deleted

## 2017-11-26 DIAGNOSIS — I480 Paroxysmal atrial fibrillation: Secondary | ICD-10-CM | POA: Diagnosis not present

## 2017-11-26 DIAGNOSIS — I428 Other cardiomyopathies: Secondary | ICD-10-CM

## 2017-11-26 NOTE — Progress Notes (Signed)
Remote pacemaker transmission.   

## 2017-12-04 ENCOUNTER — Other Ambulatory Visit: Payer: Self-pay | Admitting: Internal Medicine

## 2017-12-04 ENCOUNTER — Other Ambulatory Visit: Payer: Self-pay | Admitting: Gastroenterology

## 2017-12-04 ENCOUNTER — Ambulatory Visit: Payer: Medicare Other | Admitting: Internal Medicine

## 2017-12-04 ENCOUNTER — Encounter: Payer: Self-pay | Admitting: Internal Medicine

## 2017-12-04 VITALS — BP 124/70 | HR 71 | Ht 64.5 in | Wt 176.8 lb

## 2017-12-04 DIAGNOSIS — I495 Sick sinus syndrome: Secondary | ICD-10-CM

## 2017-12-04 DIAGNOSIS — Z950 Presence of cardiac pacemaker: Secondary | ICD-10-CM | POA: Diagnosis not present

## 2017-12-04 DIAGNOSIS — I480 Paroxysmal atrial fibrillation: Secondary | ICD-10-CM

## 2017-12-04 NOTE — Progress Notes (Signed)
HPI Mrs. Pelchat returns today for ongoing evaluation and management of sinus node dysfunction, paroxysmal atrial fibrillation, status post permanent pacemaker insertion and hypertension.. The patient has done well in the interim with no episodes of syncope and no chest pain. She has become more sedentary and is not walking as much, concerned that she might fall. She has arthritic complaints. She denies chest pain. Minimal dyspnea. No syncope.  No Known Allergies   Current Outpatient Medications  Medication Sig Dispense Refill  . ALPRAZolam (XANAX) 0.25 MG tablet TAKE 1 TABLET BY MOUTH AS NEEDED FOR ANXIETY 30 tablet 5  . aspirin 81 MG tablet Take 81 mg by mouth daily.    Marland Kitchen BIOTIN PO Take by mouth daily.    Marland Kitchen CREON 12000 units CPEP capsule TAKE 2 CAPSULES BY MOUTH 4 TIMES A DAY 720 capsule 3  . diphenoxylate-atropine (LOMOTIL) 2.5-0.025 MG tablet TAKE 1 TABLET BY MOUTH TWICE A DAY 60 tablet 1  . ELIQUIS 2.5 MG TABS tablet TAKE 1 TABLET BY MOUTH TWICE A DAY 180 tablet 1  . LYRICA 75 MG capsule TAKE 1 CAPSULE BY MOUTH THREE TIMES A DAY 270 capsule 1  . metoprolol tartrate (LOPRESSOR) 25 MG tablet Take 1 tablet (25 mg total) by mouth 2 (two) times daily. 180 tablet 0  . Omega-3 Fatty Acids (OMEGA-3 FISH OIL PO) Take by mouth.    . pravastatin (PRAVACHOL) 20 MG tablet Take 1 tablet (20 mg total) by mouth daily. 90 tablet 1  . Probiotic Product (PROBIOTIC PO) Take 1 tablet by mouth daily.    Marland Kitchen spironolactone (ALDACTONE) 25 MG tablet Take 1 tablet (25 mg total) by mouth daily. 90 tablet 1  . traZODone (DESYREL) 50 MG tablet Take 1 tablet (50 mg total) by mouth at bedtime as needed for sleep. 90 tablet 1   No current facility-administered medications for this visit.      Past Medical History:  Diagnosis Date  . Anxiety   . Atrial fibrillation (HCC)   . Blood transfusion without reported diagnosis   . GAD (generalized anxiety disorder)   . Hearing loss   . Hyperlipidemia   .  Hypertension   . PHN (postherpetic neuralgia) 2010  . Systolic CHF (HCC)     ROS:   All systems reviewed and negative except as noted in the HPI.   Past Surgical History:  Procedure Laterality Date  . ABDOMINAL HYSTERECTOMY    . PACEMAKER PLACEMENT    . TOTAL KNEE ARTHROPLASTY Bilateral      Family History  Problem Relation Age of Onset  . Heart disease Father   . Heart disease Sister      Social History   Socioeconomic History  . Marital status: Unknown    Spouse name: Not on file  . Number of children: 3  . Years of education: Not on file  . Highest education level: Not on file  Occupational History  . Occupation: retired  Engineer, production  . Financial resource strain: Not on file  . Food insecurity:    Worry: Not on file    Inability: Not on file  . Transportation needs:    Medical: Not on file    Non-medical: Not on file  Tobacco Use  . Smoking status: Former Smoker    Years: 25.00    Last attempt to quit: 08/03/1988    Years since quitting: 29.3  . Smokeless tobacco: Never Used  Substance and Sexual Activity  . Alcohol use: Yes  .  Drug use: No  . Sexual activity: Not Currently  Lifestyle  . Physical activity:    Days per week: Not on file    Minutes per session: Not on file  . Stress: Not on file  Relationships  . Social connections:    Talks on phone: Not on file    Gets together: Not on file    Attends religious service: Not on file    Active member of club or organization: Not on file    Attends meetings of clubs or organizations: Not on file    Relationship status: Not on file  . Intimate partner violence:    Fear of current or ex partner: Not on file    Emotionally abused: Not on file    Physically abused: Not on file    Forced sexual activity: Not on file  Other Topics Concern  . Not on file  Social History Narrative  . Not on file     BP 124/70   Pulse 71   Ht 5' 4.5" (1.638 m)   Wt 176 lb 12.8 oz (80.2 kg)   SpO2 97%   BMI  29.88 kg/m   Physical Exam:  Well appearing elderly woman, NAD HEENT: Unremarkable Neck:  6 cm JVD, no thyromegally Lymphatics:  No adenopathy Back:  No CVA tenderness Lungs:  Clear with no wheezes HEART:  Regular rate rhythm, no murmurs, no rubs, no clicks Abd:  soft, positive bowel sounds, no organomegally, no rebound, no guarding Ext:  2 plus pulses, no edema, no cyanosis, no clubbing Skin:  No rashes no nodules Neuro:  CN II through XII intact, motor grossly intact   DEVICE  Normal device function.  See PaceArt for details.   Assess/Plan: 1. Sinus node dysfunction - she is asymptomatic, s/p PPM and pacing almost 93% in the atrium. 2. PPM - her boston Sci DDD PM is working normally. We will recheck in several months. 3. HTN - her blood pressure is well controlled.   Leonia Reeves.D.

## 2017-12-04 NOTE — Patient Instructions (Signed)
Medication Instructions:  Your physician recommends that you continue on your current medications as directed. Please refer to the Current Medication list given to you today.  Labwork: None ordered.  Testing/Procedures: None ordered.  Follow-Up: Your physician wants you to follow-up in: one year with Dr. Ladona Ridgel.  You will receive a reminder letter in the mail two months in advance. If you don't receive a letter, please call our office to schedule the follow-up appointment.  Remote monitoring is used to monitor your Pacemaker from home. This monitoring reduces the number of office visits required to check your device to one time per year. It allows [place] to keep an eye on the functioning of your device to ensure it is working properly. You are scheduled for a device check from home on 02/25/2018. You may send your transmission at any time that day. If you have a wireless device, the transmission will be sent automatically. After your physician reviews your transmission, you will receive a postcard with your next transmission date.  Any Other Special Instructions Will Be Listed Below (If Applicable).  If you need a refill on your cardiac medications before your next appointment, please call your pharmacy.

## 2017-12-12 ENCOUNTER — Other Ambulatory Visit: Payer: Self-pay | Admitting: Internal Medicine

## 2017-12-12 DIAGNOSIS — I10 Essential (primary) hypertension: Secondary | ICD-10-CM

## 2017-12-17 ENCOUNTER — Other Ambulatory Visit: Payer: Self-pay | Admitting: Internal Medicine

## 2017-12-17 DIAGNOSIS — K219 Gastro-esophageal reflux disease without esophagitis: Secondary | ICD-10-CM

## 2017-12-23 NOTE — Progress Notes (Addendum)
Subjective:   Deborah Chung is a 84 y.o. female who presents for Medicare Annual (Subsequent) preventive examination.  Review of Systems:  No ROS.  Medicare Wellness Visit. Additional risk factors are reflected in the social history.  Cardiac Risk Factors include: advanced age (>13men, >20 women);dyslipidemia;hypertension;obesity (BMI >30kg/m2) Sleep patterns: feels rested on waking, gets up 2-3 times nightly to void and sleeps 5-6 hours nightly. Patient reports insomnia issues, discussed recommended sleep tips.   Home Safety/Smoke Alarms: Feels safe in home. Smoke alarms in place.  Living environment; residence and Firearm Safety: apartment, no firearms. Resides at [facility] senior [facility] [employer] Safety/Bike Helmet: Wears seat belt.      Objective:     Vitals: BP 126/62   Pulse 69   Temp 97.6 F (36.4 C)   Resp 18   Ht 5\' 5"  (1.651 m)   Wt 181 lb (82.1 kg)   SpO2 95%   BMI 30.12 kg/m   Body mass index is 30.12 kg/m.  Advanced Directives 12/24/2017 05/31/2015 05/30/2015  Does Patient Have a Medical Advance Directive? Yes Yes Yes  Type of Estate agent of [place];Living will Healthcare Power of [place];Living will Healthcare Power of [place];Living will  Does patient want to make changes to medical advance directive? - No - Patient declined No - Patient declined  Copy of Healthcare Power of Attorney in Chart? No - copy requested Yes Yes    Tobacco Social History   Tobacco Use  Smoking Status Former Smoker  . Years: 25.00  . Last attempt to quit: 08/03/1988  . Years since quitting: 29.4  Smokeless Tobacco Never Used     Counseling given: Not Answered  Past Medical History:  Diagnosis Date  . Anxiety   . Atrial fibrillation (HCC)   . Blood transfusion without reported diagnosis   . GAD (generalized anxiety disorder)   . Hearing loss   . Hyperlipidemia   . Hypertension   . PHN (postherpetic neuralgia) 2010  . Systolic CHF [facility])     Past Surgical History:  Procedure Laterality Date  . ABDOMINAL HYSTERECTOMY    . PACEMAKER PLACEMENT    . TOTAL KNEE ARTHROPLASTY Bilateral    Family History  Problem Relation Age of Onset  . Heart disease Father   . Heart disease Sister    Social History   Socioeconomic History  . Marital status: Widowed    Spouse name: Not on file  . Number of children: 3  . Years of education: Not on file  . Highest education level: Not on file  Occupational History  . Occupation: retired  Engineer, production  . Financial resource strain: Not hard at all  . Food insecurity:    Worry: Never true    Inability: Never true  . Transportation needs:    Medical: No    Non-medical: No  Tobacco Use  . Smoking status: Former Smoker    Years: 25.00    Last attempt to quit: 08/03/1988    Years since quitting: 29.4  . Smokeless tobacco: Never Used  Substance and Sexual Activity  . Alcohol use: Not Currently  . Drug use: No  . Sexual activity: Never  Lifestyle  . Physical activity:    Days per week: 0 days    Minutes per session: 0 min  . Stress: Not at all  Relationships  . Social connections:    Talks on phone: More than three times a week    Gets together: More than three times  a week    Attends religious service: More than 4 times per year    Active member of club or organization: Yes    Attends meetings of clubs or organizations: More than 4 times per year    Relationship status: Widowed  Other Topics Concern  . Not on file  Social History Narrative  . Not on file    Outpatient Encounter Medications as of 12/24/2017  Medication Sig  . ALPRAZolam (XANAX) 0.25 MG tablet TAKE 1 TABLET BY MOUTH AS NEEDED FOR ANXIETY  . aspirin 81 MG tablet Take 81 mg by mouth daily.  Marland Kitchen BIOTIN PO Take by mouth daily.  Marland Kitchen CREON 12000 units CPEP capsule TAKE 2 CAPSULES BY MOUTH 4 TIMES A DAY  . diphenoxylate-atropine (LOMOTIL) 2.5-0.025 MG tablet TAKE 1 TABLET BY MOUTH TWICE A DAY  . ELIQUIS 2.5 MG TABS  tablet TAKE 1 TABLET BY MOUTH TWICE A DAY  . famotidine (PEPCID) 20 MG tablet Take 20 mg by mouth 2 (two) times daily.  Marland Kitchen LYRICA 75 MG capsule TAKE 1 CAPSULE BY MOUTH THREE TIMES A DAY  . metoprolol tartrate (LOPRESSOR) 25 MG tablet Take 1 tablet (25 mg total) by mouth 2 (two) times daily.  . Omega-3 Fatty Acids (OMEGA-3 FISH OIL PO) Take by mouth.  . pravastatin (PRAVACHOL) 20 MG tablet Take 1 tablet (20 mg total) by mouth daily.  . Probiotic Product (PROBIOTIC PO) Take 1 tablet by mouth daily.  Marland Kitchen spironolactone (ALDACTONE) 25 MG tablet TAKE 1 TABLET BY MOUTH EVERY DAY  . traZODone (DESYREL) 50 MG tablet Take 1 tablet (50 mg total) by mouth at bedtime as needed for sleep.  . [DISCONTINUED] ranitidine (ZANTAC) 300 MG tablet TAKE 1 TABLET BY MOUTH EVERYDAY AT BEDTIME (Patient not taking: Reported on 12/24/2017)   No facility-administered encounter medications on file as of 12/24/2017.     Activities of Daily Living In your present state of health, do you have any difficulty performing the following activities: 12/24/2017  Hearing? Y  Vision? N  Difficulty concentrating or making decisions? N  Walking or climbing stairs? Y  Dressing or bathing? N  Doing errands, shopping? Y  Preparing Food and eating ? N  Using the Toilet? N  In the past six months, have you accidently leaked urine? N  Do you have problems with loss of bowel control? Y  Managing your Medications? N  Managing your Finances? N  Comment son assist as needed  Housekeeping or managing your Housekeeping? Y  Some recent data might be hidden    Patient Care Team: Deborah Grandchild, MD as PCP - General (Internal Medicine)    Assessment:   This is a routine wellness examination for Deborah Chung. Physical assessment deferred to PCP.   Exercise Activities and Dietary recommendations Current Exercise Habits: The patient does not participate in regular exercise at present, Exercise limited by: orthopedic condition(s)  Diet (meal  preparation, eat out, water intake, caffeinated beverages, dairy products, fruits and vegetables): in general, a "healthy" diet  , well balanced, on average, 3 meals per day  Reviewed heart healthy diet. Encouraged patient to increase daily water and healthy fluid intake.  Goals    . Patient Stated     Continue to stay socially active and enjoy spending time with family.       Fall Risk Fall Risk  12/24/2017 06/24/2017 05/31/2015 05/30/2015 05/30/2015  Falls in the past year? 0 No No No Yes  Number falls in past yr: 0 - - -  1  Injury with Fall? 0 - - - No  Risk for fall due to : Impaired mobility;Impaired balance/gait - - - -  Follow up - - - - Falls evaluation completed    Depression Screen PHQ 2/9 Scores 12/24/2017 06/24/2017 06/24/2017 05/31/2015  PHQ - 2 Score 2 2 2  0  PHQ- 9 Score 8 7 - -     Cognitive Function MMSE - Mini Mental State Exam 12/24/2017  Orientation to time 5  Orientation to Place 5  Registration 3  Attention/ Calculation 5  Recall 1  Language- name 2 objects 2  Language- repeat 1  Language- follow 3 step command 3  Language- read & follow direction 1  Write a sentence 1  Copy design 1  Total score 28        Immunization History  Administered Date(s) Administered  . Influenza, High Dose Seasonal PF 09/11/2015, 10/15/2016  . Influenza-Unspecified 10/20/2014, 10/22/2017  . Pneumococcal Conjugate-13 06/29/2013  . Pneumococcal Polysaccharide-23 10/20/2008  . Tdap 06/29/2013   Screening Tests Health Maintenance  Topic Date Due  . TETANUS/TDAP  06/30/2023  . INFLUENZA VACCINE  Completed  . DEXA SCAN  Completed  . PNA vac Low Risk Adult  Completed      Plan:     Continue doing brain stimulating activities (puzzles, reading, adult coloring books, staying active) to keep memory sharp.   Continue to eat heart healthy diet (full of fruits, vegetables, whole grains, lean protein, water--limit salt, fat, and sugar intake) and increase physical  activity as tolerated.  I have personally reviewed and noted the following in the patient's chart:   . Medical and social history . Use of alcohol, tobacco or illicit drugs  . Current medications and supplements . Functional ability and status . Nutritional status . Physical activity . Advanced directives . List of other physicians . Vitals . Screenings to include cognitive, depression, and falls . Referrals and appointments  In addition, I have reviewed and discussed with patient certain preventive protocols, quality metrics, and best practice recommendations. A written personalized care plan for preventive services as well as general preventive health recommendations were provided to patient.     Wanda Plump, RN  12/24/2017   Medical screening examination/treatment/procedure(s) were performed by non-physician practitioner and as supervising physician I was immediately available for consultation/collaboration. I agree with above. Sanda Linger, MD

## 2017-12-24 ENCOUNTER — Encounter: Payer: Self-pay | Admitting: Internal Medicine

## 2017-12-24 ENCOUNTER — Other Ambulatory Visit (INDEPENDENT_AMBULATORY_CARE_PROVIDER_SITE_OTHER): Payer: Medicare Other

## 2017-12-24 ENCOUNTER — Ambulatory Visit (INDEPENDENT_AMBULATORY_CARE_PROVIDER_SITE_OTHER): Payer: Medicare Other | Admitting: Internal Medicine

## 2017-12-24 ENCOUNTER — Ambulatory Visit (INDEPENDENT_AMBULATORY_CARE_PROVIDER_SITE_OTHER)
Admission: RE | Admit: 2017-12-24 | Discharge: 2017-12-24 | Disposition: A | Discharge status: Home / Self Care | Payer: Medicare Other | Source: Ambulatory Visit | Attending: Internal Medicine | Admitting: Internal Medicine

## 2017-12-24 ENCOUNTER — Ambulatory Visit: Payer: Medicare Other | Admitting: *Deleted

## 2017-12-24 VITALS — BP 126/62 | HR 69 | Temp 97.6°F | Resp 16 | Ht 65.0 in | Wt 181.0 lb

## 2017-12-24 VITALS — BP 126/62 | HR 69 | Temp 97.6°F | Resp 18 | Ht 65.0 in | Wt 181.0 lb

## 2017-12-24 DIAGNOSIS — M542 Cervicalgia: Secondary | ICD-10-CM | POA: Diagnosis not present

## 2017-12-24 DIAGNOSIS — Z0000 Encounter for general adult medical examination without abnormal findings: Secondary | ICD-10-CM

## 2017-12-24 DIAGNOSIS — M25512 Pain in left shoulder: Secondary | ICD-10-CM

## 2017-12-24 DIAGNOSIS — I10 Essential (primary) hypertension: Secondary | ICD-10-CM

## 2017-12-24 DIAGNOSIS — F5104 Psychophysiologic insomnia: Secondary | ICD-10-CM | POA: Diagnosis not present

## 2017-12-24 DIAGNOSIS — I480 Paroxysmal atrial fibrillation: Secondary | ICD-10-CM | POA: Diagnosis not present

## 2017-12-24 DIAGNOSIS — F418 Other specified anxiety disorders: Secondary | ICD-10-CM

## 2017-12-24 LAB — BASIC METABOLIC PANEL
BUN: 17 mg/dL (ref 6–23)
CO2: 32 mEq/L (ref 19–32)
Calcium: 9.5 mg/dL (ref 8.4–10.5)
Chloride: 103 mEq/L (ref 96–112)
Creatinine, Ser: 1 mg/dL (ref 0.40–1.20)
GFR: 55.41 mL/min — ABNORMAL LOW (ref >60.00)
Glucose, Bld: 101 mg/dL — ABNORMAL HIGH (ref 70–99)
Potassium: 5 mEq/L (ref 3.5–5.1)
Sodium: 139 mEq/L (ref 135–145)

## 2017-12-24 MED ORDER — DULOXETINE HCL 30 MG PO CPEP: 30.0000 mg | ORAL_CAPSULE | Freq: Every day | ORAL | 0 refills | Status: DC

## 2017-12-24 MED ORDER — TRAZODONE HCL 50 MG PO TABS: 100.0000 mg | ORAL_TABLET | Freq: Every evening | ORAL | 1 refills | Status: DC | PRN

## 2017-12-24 NOTE — Patient Instructions (Addendum)
Continue doing brain stimulating activities (puzzles, reading, adult coloring books, staying active) to keep memory sharp.   Continue to eat heart healthy diet (full of fruits, vegetables, whole grains, lean protein, water--limit salt, fat, and sugar intake) and increase physical activity as tolerated.   [name] , Thank you for taking time to come for your Medicare Wellness Visit. I appreciate your ongoing commitment to your health goals. Please review the following plan we discussed and let me know if I can assist you in the future.   These are the goals we discussed: Goals    . Patient Stated     Continue to stay socially active and enjoy spending time with family.       This is a list of the screening recommended for you and due dates:  Health Maintenance  Topic Date Due  . Tetanus Vaccine  06/30/2023  . Flu Shot  Completed  . DEXA scan (bone density measurement)  Completed  . Pneumonia vaccines  Completed   Health Maintenance, Female Adopting a healthy lifestyle and getting preventive care can go a long way to promote health and wellness. Talk with your health care provider about what schedule of regular examinations is right for you. This is a good chance for you to check in with your provider about disease prevention and staying healthy. In between checkups, there are plenty of things you can do on your own. Experts have done a lot of research about which lifestyle changes and preventive measures are most likely to keep you healthy. Ask your health care provider for more information. Weight and diet Eat a healthy diet  Be sure to include plenty of vegetables, fruits, low-fat dairy products, and lean protein.  Do not eat a lot of foods high in solid fats, added sugars, or salt.  Get regular exercise. This is one of the most important things you can do for your health. ? Most adults should exercise for at least 150 minutes each week. The exercise should increase your heart  rate and make you sweat (moderate-intensity exercise). ? Most adults should also do strengthening exercises at least twice a week. This is in addition to the moderate-intensity exercise.  Maintain a healthy weight  Body mass index (BMI) is a measurement that can be used to identify possible weight problems. It estimates body fat based on height and weight. Your health care provider can help determine your BMI and help you achieve or maintain a healthy weight.  For females 49 years of age and older: ? A BMI below 18.5 is considered underweight. ? A BMI of 18.5 to 24.9 is normal. ? A BMI of 25 to 29.9 is considered overweight. ? A BMI of 30 and above is considered obese.  Watch levels of cholesterol and blood lipids  You should start having your blood tested for lipids and cholesterol at 82 years of age, then have this test every 5 years.  You may need to have your cholesterol levels checked more often if: ? Your lipid or cholesterol levels are high. ? You are older than 82 years of age. ? You are at high risk for heart disease.  Cancer screening Lung Cancer  Lung cancer screening is recommended for adults 85-73 years old who are at high risk for lung cancer because of a history of smoking.  A yearly low-dose CT scan of the lungs is recommended for people who: ? Currently smoke. ? Have quit within the past 15 years. ? Have at  least a 30-pack-year history of smoking. A pack year is smoking an average of one pack of cigarettes a day for 1 year.  Yearly screening should continue until it has been 15 years since you quit.  Yearly screening should stop if you develop a health problem that would prevent you from having lung cancer treatment.  Breast Cancer  Practice breast self-awareness. This means understanding how your breasts normally appear and feel.  It also means doing regular breast self-exams. Let your health care provider know about any changes, no matter how small.  If  you are in your 20s or 30s, you should have a clinical breast exam (CBE) by a health care provider every 1-3 years as part of a regular health exam.  If you are 81 or older, have a CBE every year. Also consider having a breast X-ray (mammogram) every year.  If you have a family history of breast cancer, talk to your health care provider about genetic screening.  If you are at high risk for breast cancer, talk to your health care provider about having an MRI and a mammogram every year.  Breast cancer gene (BRCA) assessment is recommended for women who have family members with BRCA-related cancers. BRCA-related cancers include: ? Breast. ? Ovarian. ? Tubal. ? Peritoneal cancers.  Results of the assessment will determine the need for genetic counseling and BRCA1 and BRCA2 testing.  Cervical Cancer Your health care provider may recommend that you be screened regularly for cancer of the pelvic organs (ovaries, uterus, and vagina). This screening involves a pelvic examination, including checking for microscopic changes to the surface of your cervix (Pap test). You may be encouraged to have this screening done every 3 years, beginning at age 83.  For women ages 22-65, health care providers may recommend pelvic exams and Pap testing every 3 years, or they may recommend the Pap and pelvic exam, combined with testing for human papilloma virus (HPV), every 5 years. Some types of HPV increase your risk of cervical cancer. Testing for HPV may also be done on women of any age with unclear Pap test results.  Other health care providers may not recommend any screening for nonpregnant women who are considered low risk for pelvic cancer and who do not have symptoms. Ask your health care provider if a screening pelvic exam is right for you.  If you have had past treatment for cervical cancer or a condition that could lead to cancer, you need Pap tests and screening for cancer for at least 20 years after your  treatment. If Pap tests have been discontinued, your risk factors (such as having a new sexual partner) need to be reassessed to determine if screening should resume. Some women have medical problems that increase the chance of getting cervical cancer. In these cases, your health care provider may recommend more frequent screening and Pap tests.  Colorectal Cancer  This type of cancer can be detected and often prevented.  Routine colorectal cancer screening usually begins at 82 years of age and continues through 82 years of age.  Your health care provider may recommend screening at an earlier age if you have risk factors for colon cancer.  Your health care provider may also recommend using home test kits to check for hidden blood in the stool.  A small camera at the end of a tube can be used to examine your colon directly (sigmoidoscopy or colonoscopy). This is done to check for the earliest forms of colorectal cancer.  Routine screening usually begins at age 20.  Direct examination of the colon should be repeated every 5-10 years through 82 years of age. However, you may need to be screened more often if early forms of precancerous polyps or small growths are found.  Skin Cancer  Check your skin from head to toe regularly.  Tell your health care provider about any new moles or changes in moles, especially if there is a change in a mole's shape or color.  Also tell your health care provider if you have a mole that is larger than the size of a pencil eraser.  Always use sunscreen. Apply sunscreen liberally and repeatedly throughout the day.  Protect yourself by wearing long sleeves, pants, a wide-brimmed hat, and sunglasses whenever you are outside.  Heart disease, diabetes, and high blood pressure  High blood pressure causes heart disease and increases the risk of stroke. High blood pressure is more likely to develop in: ? People who have blood pressure in the high end of the normal  range (130-139/85-89 mm Hg). ? People who are overweight or obese. ? People who are African American.  If you are 92-20 years of age, have your blood pressure checked every 3-5 years. If you are 85 years of age or older, have your blood pressure checked every year. You should have your blood pressure measured twice-once when you are at a hospital or clinic, and once when you are not at a hospital or clinic. Record the average of the two measurements. To check your blood pressure when you are not at a hospital or clinic, you can use: ? An automated blood pressure machine at a pharmacy. ? A home blood pressure monitor.  If you are between 54 years and 88 years old, ask your health care provider if you should take aspirin to prevent strokes.  Have regular diabetes screenings. This involves taking a blood sample to check your fasting blood sugar level. ? If you are at a normal weight and have a low risk for diabetes, have this test once every three years after 82 years of age. ? If you are overweight and have a high risk for diabetes, consider being tested at a younger age or more often. Preventing infection Hepatitis B  If you have a higher risk for hepatitis B, you should be screened for this virus. You are considered at high risk for hepatitis B if: ? You were born in a country where hepatitis B is common. Ask your health care provider which countries are considered high risk. ? Your parents were born in a high-risk country, and you have not been immunized against hepatitis B (hepatitis B vaccine). ? You have HIV or AIDS. ? You use needles to inject street drugs. ? You live with someone who has hepatitis B. ? You have had sex with someone who has hepatitis B. ? You get hemodialysis treatment. ? You take certain medicines for conditions, including cancer, organ transplantation, and autoimmune conditions.  Hepatitis C  Blood testing is recommended for: ? Everyone born from [birth year] through  1963. ? Anyone with known risk factors for hepatitis C.  Sexually transmitted infections (STIs)  You should be screened for sexually transmitted infections (STIs) including gonorrhea and chlamydia if: ? You are sexually active and are younger than 82 years of age. ? You are older than 82 years of age and your health care provider tells you that you are at risk for this type of infection. ? Your sexual activity  has changed since you were last screened and you are at an increased risk for chlamydia or gonorrhea. Ask your health care provider if you are at risk.  If you do not have HIV, but are at risk, it may be recommended that you take a prescription medicine daily to prevent HIV infection. This is called pre-exposure prophylaxis (PrEP). You are considered at risk if: ? You are sexually active and do not regularly use condoms or know the HIV status of your partner(s). ? You take drugs by injection. ? You are sexually active with a partner who has HIV.  Talk with your health care provider about whether you are at high risk of being infected with HIV. If you choose to begin PrEP, you should first be tested for HIV. You should then be tested every 3 months for as long as you are taking PrEP. Pregnancy  If you are premenopausal and you may become pregnant, ask your health care provider about preconception counseling.  If you may become pregnant, take 400 to 800 micrograms (mcg) of folic acid every day.  If you want to prevent pregnancy, talk to your health care provider about birth control (contraception). Osteoporosis and menopause  Osteoporosis is a disease in which the bones lose minerals and strength with aging. This can result in serious bone fractures. Your risk for osteoporosis can be identified using a bone density scan.  If you are 53 years of age or older, or if you are at risk for osteoporosis and fractures, ask your health care provider if you should be screened.  Ask your health  care provider whether you should take a calcium or vitamin D supplement to lower your risk for osteoporosis.  Menopause may have certain physical symptoms and risks.  Hormone replacement therapy may reduce some of these symptoms and risks. Talk to your health care provider about whether hormone replacement therapy is right for you. Follow these instructions at home:  Schedule regular health, dental, and eye exams.  Stay current with your immunizations.  Do not use any tobacco products including cigarettes, chewing tobacco, or electronic cigarettes.  If you are pregnant, do not drink alcohol.  If you are breastfeeding, limit how much and how often you drink alcohol.  Limit alcohol intake to no more than 1 drink per day for nonpregnant women. One drink equals 12 ounces of beer, 5 ounces of Katlen Seyer, or 1 ounces of hard liquor.  Do not use street drugs.  Do not share needles.  Ask your health care provider for help if you need support or information about quitting drugs.  Tell your health care provider if you often feel depressed.  Tell your health care provider if you have ever been abused or do not feel safe at home. This information is not intended to replace advice given to you by your health care provider. Make sure you discuss any questions you have with your health care provider. Document Released: 07/16/2010 Document Revised: 06/08/2015 Document Reviewed: 10/04/2014 Elsevier Interactive Patient Education  [employer].

## 2017-12-24 NOTE — Patient Instructions (Signed)

## 2017-12-24 NOTE — Progress Notes (Signed)
Subjective:  Patient ID: Deborah Chung, female    DOB: May 29, 1926  Age: 84 y.o. MRN: [id number]  CC: Hypertension and Atrial Fibrillation   HPI Deborah Chung presents for f/up - She complains of a several month history of left shoulder pain and left-sided neck pain.  It is very difficult to get a history from her but it sounds like she fell about 2 months ago and has had discomfort since then.  She tells me the pain in her neck and shoulder does not radiate into her upper extremity and she denies paresthesias.  She is taking Tylenol for the discomfort.  She complains that the range of motion in her left shoulder has diminished.  She complains that her current dose of trazodone does not completely treat her insomnia.  She has frequent awakenings and only gets 2 to 3 hours of sleep at a time.  Outpatient Medications Prior to Visit  Medication Sig Dispense Refill  . ALPRAZolam (XANAX) 0.25 MG tablet TAKE 1 TABLET BY MOUTH AS NEEDED FOR ANXIETY 30 tablet 5  . aspirin 81 MG tablet Take 81 mg by mouth daily.    Marland Kitchen BIOTIN PO Take by mouth daily.    Marland Kitchen CREON 12000 units CPEP capsule TAKE 2 CAPSULES BY MOUTH 4 TIMES A DAY 720 capsule 3  . diphenoxylate-atropine (LOMOTIL) 2.5-0.025 MG tablet TAKE 1 TABLET BY MOUTH TWICE A DAY 60 tablet 1  . ELIQUIS 2.5 MG TABS tablet TAKE 1 TABLET BY MOUTH TWICE A DAY 180 tablet 1  . LYRICA 75 MG capsule TAKE 1 CAPSULE BY MOUTH THREE TIMES A DAY 270 capsule 1  . metoprolol tartrate (LOPRESSOR) 25 MG tablet Take 1 tablet (25 mg total) by mouth 2 (two) times daily. 180 tablet 0  . Omega-3 Fatty Acids (OMEGA-3 FISH OIL PO) Take by mouth.    . pravastatin (PRAVACHOL) 20 MG tablet Take 1 tablet (20 mg total) by mouth daily. 90 tablet 1  . Probiotic Product (PROBIOTIC PO) Take 1 tablet by mouth daily.    Marland Kitchen spironolactone (ALDACTONE) 25 MG tablet TAKE 1 TABLET BY MOUTH EVERY DAY 90 tablet 1  . ranitidine (ZANTAC) 300 MG tablet TAKE 1 TABLET BY MOUTH EVERYDAY AT BEDTIME (Patient  not taking: Reported on 12/24/2017) 90 tablet 1  . traZODone (DESYREL) 50 MG tablet Take 1 tablet (50 mg total) by mouth at bedtime as needed for sleep. 90 tablet 1   No facility-administered medications prior to visit.     ROS Review of Systems  Constitutional: Negative.  Negative for diaphoresis and fatigue.  HENT: Negative.  Negative for trouble swallowing.   Eyes: Negative for visual disturbance.  Respiratory: Negative for cough, chest tightness, shortness of breath and wheezing.   Cardiovascular: Negative for chest pain, palpitations and leg swelling.  Gastrointestinal: Negative for abdominal pain, constipation, diarrhea, nausea and vomiting.  Endocrine: Negative.   Genitourinary: Negative.  Negative for difficulty urinating.  Musculoskeletal: Positive for arthralgias and neck pain. Negative for back pain and myalgias.  Skin: Negative.   Allergic/Immunologic: Negative.   Neurological: Negative.  Negative for dizziness.  Hematological: Negative for adenopathy. Does not bruise/bleed easily.       She complains of depression, anxiety, and anhedonia.  Psychiatric/Behavioral: Positive for dysphoric mood and sleep disturbance. Negative for behavioral problems, decreased concentration, self-injury and suicidal ideas. The patient is nervous/anxious.     Objective:  BP 126/62   Pulse 69   Temp 97.6 F (36.4 C)   Resp 16  Ht 5\' 5"  (1.651 m)   Wt 181 lb (82.1 kg)   SpO2 95%   BMI 30.12 kg/m   BP Readings from Last 3 Encounters:  12/24/17 126/62  12/24/17 126/62  12/04/17 124/70    Wt Readings from Last 3 Encounters:  12/24/17 181 lb (82.1 kg)  12/24/17 181 lb (82.1 kg)  12/04/17 176 lb 12.8 oz (80.2 kg)    Physical Exam Vitals signs reviewed.  HENT:     Head: Normocephalic and atraumatic.  Neck:     Musculoskeletal: Normal range of motion and neck supple.  Cardiovascular:     Rate and Rhythm: Normal rate and regular rhythm.     Pulses: Normal pulses.     Heart  sounds: Normal heart sounds. No murmur. No gallop.   Pulmonary:     Effort: Pulmonary effort is normal.     Breath sounds: Normal breath sounds. No stridor. No wheezing, rhonchi or rales.  Abdominal:     General: Abdomen is flat. Bowel sounds are normal. There is no distension.     Palpations: Abdomen is soft. There is no mass.     Tenderness: There is no abdominal tenderness.  Musculoskeletal:     Left shoulder: She exhibits decreased range of motion and pain. She exhibits no tenderness, no bony tenderness, no swelling, no effusion and no deformity.     Cervical back: Normal. She exhibits normal range of motion, no tenderness, no bony tenderness, no swelling, no edema and no deformity.     Lab Results  Component Value Date   WBC 7.0 06/24/2017   HGB 14.6 06/24/2017   HCT 43.6 06/24/2017   PLT 212.0 06/24/2017   GLUCOSE 101 (H) 12/24/2017   CHOL 159 06/24/2017   TRIG 187.0 (H) 06/24/2017   HDL 42.10 06/24/2017   LDLDIRECT 106.0 05/16/2014   LDLCALC 80 06/24/2017   ALT 16 10/02/2015   AST 21 10/02/2015   NA 139 12/24/2017   K 5.0 12/24/2017   CL 103 12/24/2017   CREATININE 1.00 12/24/2017   BUN 17 12/24/2017   CO2 32 12/24/2017   TSH 1.96 06/24/2017   HGBA1C 5.6 05/30/2015    Ct Abdomen Pelvis W Contrast  Result Date: 09/17/2015 CLINICAL DATA:  84 year old female with abdominal pain and diarrhea. EXAM: CT ABDOMEN AND PELVIS WITH CONTRAST TECHNIQUE: Multidetector CT imaging of the abdomen and pelvis was performed using the standard protocol following bolus administration of intravenous contrast. CONTRAST:  55mL ISOVUE-300 IOPAMIDOL (ISOVUE-300) INJECTION 61% COMPARISON:  None. FINDINGS: The visualized lung bases appear clear. Cardiac pacemaker wires noted. No intra-abdominal free air or free fluid. Subcentimeter scattered hepatic hypodensities are too small to characterize but likely represent cysts or hemangioma. The liver is otherwise unremarkable with there is no calcified  gallstone or pericholecystic fluid. The common bile duct is within upper limits of normal and measures 9 mm in diameter. No CBD stone identified. Scattered coarse pancreatic calcifications sequela of chronic pancreatitis. No acute inflammatory changes or pancreatic duct dilatation. The spleen, adrenal glands appear unremarkable. Mild bilateral renal atrophy. An 11 mm right renal interpolar all hypodensity most likely a cyst. There is no hydronephrosis on either side. The visualized ureters and urinary bladder appear unremarkable. Hysterectomy. There is sigmoid diverticulosis with muscular hypertrophy. No active inflammatory changes. There is no evidence of bowel obstruction or active inflammation. Normal appendix. There is moderate aortoiliac atherosclerotic disease. The origins of the celiac axis, SMA, IMA as well as the origins of the renal arteries appear patent.  There are atherosclerotic calcification of the origins of the celiac axis and renal arteries. The SMV, splenic vein, and main portal vein are patent. No portal venous gas identified. There is no adenopathy. The abdominal wall soft tissues appear unremarkable. There is osteopenia with degenerative changes of the spine. No acute fracture. Multilevel disc desiccation with vacuum phenomenon. IMPRESSION: No acute intra-abdominal or pelvic pathology. No evidence of bowel obstruction or active inflammation. Sigmoid diverticulosis. Electronically Signed   By: Elgie Collard M.D.   On: 09/17/2015 20:45    Dg Cervical Spine Complete  Result Date: 12/25/2017 CLINICAL DATA:  Larey Seat 1 week ago.  Worsening neck pain. EXAM: CERVICAL SPINE - COMPLETE 4+ VIEW COMPARISON:  None. FINDINGS: Normal overall alignment of the cervical vertebral bodies. No acute fracture is identified. Mild degenerate disc disease at C4-5, C5-6 and C6-7 for age. The facets are normally aligned. Minimal bony foraminal narrowing due to uncinate spurring. The C1-2 articulations are  maintained. No abnormal prevertebral soft tissue swelling. The lung apices are grossly clear. IMPRESSION: No acute cervical spine fracture is identified. If pain persists MRI or CT may be helpful for further evaluation. Mild degenerative cervical spondylosis for age. Electronically Signed   By: Rudie Meyer M.D.   On: 12/25/2017 08:35   Dg Shoulder Left  Result Date: 12/25/2017 CLINICAL DATA:  Left shoulder pain status post fall 7 days ago EXAM: LEFT SHOULDER - 2+ VIEW COMPARISON:  None. FINDINGS: There is no fracture or dislocation. The glenohumeral joint is normal. There are mild degenerative changes of the acromioclavicular joint. There is a left-sided cardiac pacemaker. IMPRESSION: No acute osseous injury of the left shoulder. Electronically Signed   By: Elige Ko   On: 12/25/2017 08:27    Assessment & Plan:   Eular was seen today for hypertension and atrial fibrillation.  Diagnoses and all orders for this visit:  Paroxysmal atrial fibrillation (HCC)- She is maintaining sinus rhythm.  Will continue anticoagulation with Eliquis.  Essential hypertension, benign- Her BP is well controlled.  Electrolytes and renal function are normal. -     Basic metabolic panel; Future  Psychophysiological insomnia- I have recommended that she increase the dose of trazodone. -     traZODone (DESYREL) 50 MG tablet; Take 2 tablets (100 mg total) by mouth at bedtime as needed for sleep.  Left shoulder pain, unspecified chronicity- Her plain film is normal but she has decreased range of motion.  I am concerned she has developed a rotator cuff disorder so of asked her to see sports medicine. -     DG Shoulder Left; Future -     Ambulatory referral to Sports Medicine  Neck pain on left side -     DG Cervical Spine Complete; Future  Depression with anxiety- I have asked her to start Cymbalta at 30 mg a day.  I anticipate increasing the dose to 60 mg a day in the next month or 2.  Will also increase the  dose of trazodone. -     DULoxetine (CYMBALTA) 30 MG capsule; Take 1 capsule (30 mg total) by mouth daily. -     traZODone (DESYREL) 50 MG tablet; Take 2 tablets (100 mg total) by mouth at bedtime as needed for sleep.   I have changed Deborah Chung's traZODone. I am also having her start on DULoxetine. Additionally, I am having her maintain her Probiotic Product (PROBIOTIC PO), diphenoxylate-atropine, Omega-3 Fatty Acids (OMEGA-3 FISH OIL PO), pravastatin, ALPRAZolam, aspirin, BIOTIN PO, metoprolol tartrate, ELIQUIS, LYRICA,  CREON, and spironolactone.  Meds ordered this encounter  Medications  . DULoxetine (CYMBALTA) 30 MG capsule    Sig: Take 1 capsule (30 mg total) by mouth daily.    Dispense:  30 capsule    Refill:  0  . traZODone (DESYREL) 50 MG tablet    Sig: Take 2 tablets (100 mg total) by mouth at bedtime as needed for sleep.    Dispense:  180 tablet    Refill:  1     Follow-up: Return in about 4 weeks (around 01/21/2018).  Sanda Linger, MD

## 2018-01-13 ENCOUNTER — Other Ambulatory Visit: Payer: Self-pay | Admitting: Internal Medicine

## 2018-01-17 ENCOUNTER — Other Ambulatory Visit: Payer: Self-pay | Admitting: Internal Medicine

## 2018-01-17 DIAGNOSIS — F418 Other specified anxiety disorders: Secondary | ICD-10-CM

## 2018-01-25 LAB — CUP PACEART REMOTE DEVICE CHECK
Battery Remaining Longevity: 42 mo
Battery Remaining Percentage: 66 %
Brady Statistic RA Percent Paced: 92 %
Brady Statistic RV Percent Paced: 1 %
Date Time Interrogation Session: 20191113081100
Implantable Lead Implant Date: 20150112
Implantable Lead Implant Date: 20150112
Implantable Lead Location: 753859
Implantable Lead Location: 753860
Implantable Lead Model: 4135
Implantable Lead Model: 4136
Implantable Lead Serial Number: 29308444
Implantable Lead Serial Number: 29569556
Implantable Pulse Generator Implant Date: 20150112
Lead Channel Impedance Value: 541 Ohm
Lead Channel Impedance Value: 859 Ohm
Lead Channel Pacing Threshold Amplitude: 0.7 V
Lead Channel Pacing Threshold Amplitude: 0.8 V
Lead Channel Pacing Threshold Pulse Width: 0.4 ms
Lead Channel Pacing Threshold Pulse Width: 0.5 ms
Lead Channel Setting Pacing Amplitude: 1.2 V
Lead Channel Setting Pacing Amplitude: 2.4 V
Lead Channel Setting Pacing Pulse Width: 0.4 ms
Lead Channel Setting Sensing Sensitivity: 3 mV
Pulse Gen Serial Number: 391753

## 2018-01-27 ENCOUNTER — Ambulatory Visit: Payer: Medicare Other | Admitting: Internal Medicine

## 2018-01-27 ENCOUNTER — Encounter: Payer: Self-pay | Admitting: Internal Medicine

## 2018-01-27 VITALS — BP 130/70 | HR 70 | Temp 97.4°F | Resp 16 | Ht 65.0 in | Wt 179.0 lb

## 2018-01-27 DIAGNOSIS — I10 Essential (primary) hypertension: Secondary | ICD-10-CM

## 2018-01-27 DIAGNOSIS — M8949 Other hypertrophic osteoarthropathy, multiple sites: Secondary | ICD-10-CM | POA: Insufficient documentation

## 2018-01-27 DIAGNOSIS — M7502 Adhesive capsulitis of left shoulder: Secondary | ICD-10-CM | POA: Diagnosis not present

## 2018-01-27 DIAGNOSIS — F418 Other specified anxiety disorders: Secondary | ICD-10-CM | POA: Diagnosis not present

## 2018-01-27 DIAGNOSIS — K219 Gastro-esophageal reflux disease without esophagitis: Secondary | ICD-10-CM

## 2018-01-27 DIAGNOSIS — M150 Primary generalized (osteo)arthritis: Secondary | ICD-10-CM

## 2018-01-27 DIAGNOSIS — M159 Polyosteoarthritis, unspecified: Secondary | ICD-10-CM | POA: Insufficient documentation

## 2018-01-27 MED ORDER — DULOXETINE HCL 60 MG PO CPEP: 60.0000 mg | ORAL_CAPSULE | Freq: Every day | ORAL | 1 refills | Status: DC

## 2018-01-27 MED ORDER — DEXLANSOPRAZOLE 60 MG PO CPDR: 60.0000 mg | DELAYED_RELEASE_CAPSULE | Freq: Every day | ORAL | 1 refills | Status: DC

## 2018-01-27 NOTE — Progress Notes (Signed)
Subjective:  Patient ID: Deborah Chung, female    DOB: 1926-06-03  Age: 85 y.o. MRN: [id number]  CC: Gastroesophageal Reflux and Osteoarthritis   HPI Deborah Chung presents for f/up - She complains that the H2 blocker is not controlling her heartburn.  She complains of mild dysphagia but no odynophagia, loss of appetite, or weight loss.  She has chronic constipation but denies any recent episodes of melena or blood in her stool.  She continues to complain of left shoulder pain and decreased range of motion.  She is taking duloxetine for depression and chronic pain and wants to increase the dose from 30 to 60 mg a day for better symptom improvement.  Outpatient Medications Prior to Visit  Medication Sig Dispense Refill  . ALPRAZolam (XANAX) 0.25 MG tablet TAKE 1 TABLET BY MOUTH AS NEEDED FOR ANXIETY 30 tablet 5  . aspirin 81 MG tablet Take 81 mg by mouth daily.    Marland Kitchen BIOTIN PO Take by mouth daily.    Marland Kitchen CREON 12000 units CPEP capsule TAKE 2 CAPSULES BY MOUTH 4 TIMES A DAY 720 capsule 3  . diphenoxylate-atropine (LOMOTIL) 2.5-0.025 MG tablet TAKE 1 TABLET BY MOUTH TWICE A DAY 60 tablet 1  . ELIQUIS 2.5 MG TABS tablet TAKE 1 TABLET BY MOUTH TWICE A DAY 180 tablet 1  . LYRICA 75 MG capsule TAKE 1 CAPSULE BY MOUTH THREE TIMES A DAY 270 capsule 1  . metoprolol tartrate (LOPRESSOR) 25 MG tablet TAKE 1 TABLET BY MOUTH TWICE A DAY 180 tablet 1  . Omega-3 Fatty Acids (OMEGA-3 FISH OIL PO) Take by mouth.    . pravastatin (PRAVACHOL) 20 MG tablet Take 1 tablet (20 mg total) by mouth daily. 90 tablet 1  . Probiotic Product (PROBIOTIC PO) Take 1 tablet by mouth daily.    Marland Kitchen spironolactone (ALDACTONE) 25 MG tablet TAKE 1 TABLET BY MOUTH EVERY DAY 90 tablet 1  . traZODone (DESYREL) 50 MG tablet Take 2 tablets (100 mg total) by mouth at bedtime as needed for sleep. 180 tablet 1  . DULoxetine (CYMBALTA) 30 MG capsule TAKE 1 CAPSULE BY MOUTH EVERY DAY 90 capsule 1  . famotidine (PEPCID) 20 MG tablet Take 20 mg  by mouth 2 (two) times daily.     No facility-administered medications prior to visit.     ROS Review of Systems  Constitutional: Negative for diaphoresis and fatigue.  HENT: Positive for trouble swallowing. Negative for sore throat and voice change.   Eyes: Negative for visual disturbance.  Respiratory: Negative for cough, chest tightness, shortness of breath and wheezing.   Cardiovascular: Negative for chest pain, palpitations and leg swelling.  Gastrointestinal: Positive for constipation. Negative for abdominal pain, diarrhea, nausea and vomiting.  Genitourinary: Negative.  Negative for difficulty urinating.  Musculoskeletal: Positive for arthralgias. Negative for back pain and myalgias.  Skin: Negative.  Negative for color change.  Neurological: Negative.  Negative for dizziness, weakness and headaches.  Hematological: Negative for adenopathy. Does not bruise/bleed easily.  Psychiatric/Behavioral: Positive for dysphoric mood and sleep disturbance. Negative for self-injury and suicidal ideas. The patient is nervous/anxious.     Objective:  BP 130/70 (BP Location: Left Arm, Patient Position: Sitting, Cuff Size: Normal)   Pulse 70   Temp (!) 97.4 F (36.3 C) (Oral)   Resp 16   Ht 5\' 5"  (1.651 m)   Wt 179 lb (81.2 kg)   SpO2 96%   BMI 29.79 kg/m   BP Readings from Last 3 Encounters:  01/27/18  130/70  12/24/17 126/62  12/24/17 126/62    Wt Readings from Last 3 Encounters:  01/27/18 179 lb (81.2 kg)  12/24/17 181 lb (82.1 kg)  12/24/17 181 lb (82.1 kg)    Physical Exam Vitals signs reviewed.  Constitutional:      General: She is not in acute distress.    Appearance: She is not ill-appearing, toxic-appearing or diaphoretic.  HENT:     Nose: Nose normal. No congestion or rhinorrhea.     Mouth/Throat:     Mouth: Mucous membranes are moist.     Pharynx: Oropharynx is clear. No oropharyngeal exudate or posterior oropharyngeal erythema.  Eyes:     General: No scleral  icterus.    Conjunctiva/sclera: Conjunctivae normal.  Neck:     Musculoskeletal: Normal range of motion and neck supple.  Cardiovascular:     Rate and Rhythm: Normal rate and regular rhythm.     Heart sounds: No murmur. No friction rub. No gallop.   Pulmonary:     Effort: Pulmonary effort is normal.     Breath sounds: No stridor. No wheezing, rhonchi or rales.  Abdominal:     General: Bowel sounds are normal.     Palpations: Abdomen is soft. There is no hepatomegaly, splenomegaly or mass.     Tenderness: There is no abdominal tenderness. There is no guarding.  Musculoskeletal:        General: No swelling.     Left shoulder: She exhibits decreased range of motion. She exhibits no tenderness and no deformity.     Right lower leg: No edema.  Skin:    General: Skin is warm and dry.  Neurological:     General: No focal deficit present.     Mental Status: She is oriented to person, place, and time. Mental status is at baseline.     Lab Results  Component Value Date   WBC 7.0 06/24/2017   HGB 14.6 06/24/2017   HCT 43.6 06/24/2017   PLT 212.0 06/24/2017   GLUCOSE 101 (H) 12/24/2017   CHOL 159 06/24/2017   TRIG 187.0 (H) 06/24/2017   HDL 42.10 06/24/2017   LDLDIRECT 106.0 05/16/2014   LDLCALC 80 06/24/2017   ALT 16 10/02/2015   AST 21 10/02/2015   NA 139 12/24/2017   K 5.0 12/24/2017   CL 103 12/24/2017   CREATININE 1.00 12/24/2017   BUN 17 12/24/2017   CO2 32 12/24/2017   TSH 1.96 06/24/2017   HGBA1C 5.6 05/30/2015    Dg Cervical Spine Complete  Result Date: 12/25/2017 CLINICAL DATA:  Larey Seat 1 week ago.  Worsening neck pain. EXAM: CERVICAL SPINE - COMPLETE 4+ VIEW COMPARISON:  None. FINDINGS: Normal overall alignment of the cervical vertebral bodies. No acute fracture is identified. Mild degenerate disc disease at C4-5, C5-6 and C6-7 for age. The facets are normally aligned. Minimal bony foraminal narrowing due to uncinate spurring. The C1-2 articulations are maintained. No  abnormal prevertebral soft tissue swelling. The lung apices are grossly clear. IMPRESSION: No acute cervical spine fracture is identified. If pain persists MRI or CT may be helpful for further evaluation. Mild degenerative cervical spondylosis for age. Electronically Signed   By: Rudie Meyer M.D.   On: 12/25/2017 08:35   Dg Shoulder Left  Result Date: 12/25/2017 CLINICAL DATA:  Left shoulder pain status post fall 7 days ago EXAM: LEFT SHOULDER - 2+ VIEW COMPARISON:  None. FINDINGS: There is no fracture or dislocation. The glenohumeral joint is normal. There are mild degenerative changes  of the acromioclavicular joint. There is a left-sided cardiac pacemaker. IMPRESSION: No acute osseous injury of the left shoulder. Electronically Signed   By: Elige Ko   On: 12/25/2017 08:27    Assessment & Plan:   Deborah Chung was seen today for gastroesophageal reflux and osteoarthritis.  Diagnoses and all orders for this visit:  Essential hypertension, benign- Her blood pressure is adequately well controlled.  Adhesive capsulitis of left shoulder -     Ambulatory referral to Sports Medicine  Gastroesophageal reflux disease without esophagitis- Will upgrade her from an H2 blocker to a PPI for better symptom relief. -     dexlansoprazole (DEXILANT) 60 MG capsule; Take 1 capsule (60 mg total) by mouth daily.  Depression with anxiety -     DULoxetine (CYMBALTA) 60 MG capsule; Take 1 capsule (60 mg total) by mouth daily.  Primary osteoarthritis involving multiple joints -     DULoxetine (CYMBALTA) 60 MG capsule; Take 1 capsule (60 mg total) by mouth daily.   I have discontinued Keyle Clapp's famotidine and DULoxetine. I am also having her start on dexlansoprazole and DULoxetine. Additionally, I am having her maintain her Probiotic Product (PROBIOTIC PO), diphenoxylate-atropine, Omega-3 Fatty Acids (OMEGA-3 FISH OIL PO), pravastatin, ALPRAZolam, aspirin, BIOTIN PO, ELIQUIS, LYRICA, CREON, spironolactone,  traZODone, and metoprolol tartrate.  Meds ordered this encounter  Medications  . dexlansoprazole (DEXILANT) 60 MG capsule    Sig: Take 1 capsule (60 mg total) by mouth daily.    Dispense:  90 capsule    Refill:  1  . DULoxetine (CYMBALTA) 60 MG capsule    Sig: Take 1 capsule (60 mg total) by mouth daily.    Dispense:  90 capsule    Refill:  1     Follow-up: Return in about 6 months (around 07/28/2018).  Sanda Linger, MD

## 2018-01-27 NOTE — Patient Instructions (Signed)
Gastroesophageal Reflux Disease, Adult Gastroesophageal reflux (GER) happens when acid from the stomach flows up into the tube that connects the mouth and the stomach (esophagus). Normally, food travels down the esophagus and stays in the stomach to be digested. However, when a person has GER, food and stomach acid sometimes move back up into the esophagus. If this becomes a more serious problem, the person may be diagnosed with a disease called gastroesophageal reflux disease (GERD). GERD occurs when the reflux:  Happens often.  Causes frequent or severe symptoms.  Causes problems such as damage to the esophagus. When stomach acid comes in contact with the esophagus, the acid may cause soreness (inflammation) in the esophagus. Over time, GERD may create small holes (ulcers) in the lining of the esophagus. What are the causes? This condition is caused by a problem with the muscle between the esophagus and the stomach (lower esophageal sphincter, or LES). Normally, the LES muscle closes after food passes through the esophagus to the stomach. When the LES is weakened or abnormal, it does not close properly, and that allows food and stomach acid to go back up into the esophagus. The LES can be weakened by certain dietary substances, medicines, and medical conditions, including:  Tobacco use.  Pregnancy.  Having a hiatal hernia.  Alcohol use.  Certain foods and beverages, such as coffee, chocolate, onions, and peppermint. What increases the risk? You are more likely to develop this condition if you:  Have an increased body weight.  Have a connective tissue disorder.  Use NSAID medicines. What are the signs or symptoms? Symptoms of this condition include:  Heartburn.  Difficult or painful swallowing.  The feeling of having a lump in the throat.  Abitter taste in the mouth.  Bad breath.  Having a large amount of saliva.  Having an upset or bloated  stomach.  Belching.  Chest pain. Different conditions can cause chest pain. Make sure you see your health care provider if you experience chest pain.  Shortness of breath or wheezing.  Ongoing (chronic) cough or a night-time cough.  Wearing away of tooth enamel.  Weight loss. How is this diagnosed? Your health care provider will take a medical history and perform a physical exam. To determine if you have mild or severe GERD, your health care provider may also monitor how you respond to treatment. You may also have tests, including:  A test to examine your stomach and esophagus with a small camera (endoscopy).  A test thatmeasures the acidity level in your esophagus.  A test thatmeasures how much pressure is on your esophagus.  A barium swallow or modified barium swallow test to show the shape, size, and functioning of your esophagus. How is this treated? The goal of treatment is to help relieve your symptoms and to prevent complications. Treatment for this condition may vary depending on how severe your symptoms are. Your health care provider may recommend:  Changes to your diet.  Medicine.  Surgery. Follow these instructions at home: Eating and drinking   Follow a diet as recommended by your health care provider. This may involve avoiding foods and drinks such as: ? Coffee and tea (with or without caffeine). ? Drinks that containalcohol. ? Energy drinks and sports drinks. ? Carbonated drinks or sodas. ? Chocolate and cocoa. ? Peppermint and mint flavorings. ? Garlic and onions. ? Horseradish. ? Spicy and acidic foods, including peppers, chili powder, curry powder, vinegar, hot sauces, and barbecue sauce. ? Citrus fruit juices and citrus   fruits, such as oranges, lemons, and limes. ? Tomato-based foods, such as red sauce, chili, salsa, and pizza with red sauce. ? Fried and fatty foods, such as donuts, french fries, potato chips, and high-fat dressings. ? High-fat  meats, such as hot dogs and fatty cuts of red and white meats, such as rib eye steak, sausage, ham, and bacon. ? High-fat dairy items, such as whole milk, butter, and cream cheese.  Eat small, frequent meals instead of large meals.  Avoid drinking large amounts of liquid with your meals.  Avoid eating meals during the 2-3 hours before bedtime.  Avoid lying down right after you eat.  Do not exercise right after you eat. Lifestyle   Do not use any products that contain nicotine or tobacco, such as cigarettes, e-cigarettes, and chewing tobacco. If you need help quitting, ask your health care provider.  Try to reduce your stress by using methods such as yoga or meditation. If you need help reducing stress, ask your health care provider.  If you are overweight, reduce your weight to an amount that is healthy for you. Ask your health care provider for guidance about a safe weight loss goal. General instructions  Pay attention to any changes in your symptoms.  Take over-the-counter and prescription medicines only as told by your health care provider. Do not take aspirin, ibuprofen, or other NSAIDs unless your health care provider told you to do so.  Wear loose-fitting clothing. Do not wear anything tight around your waist that causes pressure on your abdomen.  Raise (elevate) the head of your bed about 6 inches (15 cm).  Avoid bending over if this makes your symptoms worse.  Keep all follow-up visits as told by your health care provider. This is important. Contact a health care provider if:  You have: ? New symptoms. ? Unexplained weight loss. ? Difficulty swallowing or it hurts to swallow. ? Wheezing or a persistent cough. ? A hoarse voice.  Your symptoms do not improve with treatment. Get help right away if you:  Have pain in your arms, neck, jaw, teeth, or back.  Feel sweaty, dizzy, or light-headed.  Have chest pain or shortness of breath.  Vomit and your vomit looks  like blood or coffee grounds.  Faint.  Have stool that is bloody or black.  Cannot swallow, drink, or eat. Summary  Gastroesophageal reflux happens when acid from the stomach flows up into the esophagus. GERD is a disease in which the reflux happens often, causes frequent or severe symptoms, or causes problems such as damage to the esophagus.  Treatment for this condition may vary depending on how severe your symptoms are. Your health care provider may recommend diet and lifestyle changes, medicine, or surgery.  Contact a health care provider if you have new or worsening symptoms.  Take over-the-counter and prescription medicines only as told by your health care provider. Do not take aspirin, ibuprofen, or other NSAIDs unless your health care provider told you to do so.  Keep all follow-up visits as told by your health care provider. This is important. This information is not intended to replace advice given to you by your health care provider. Make sure you discuss any questions you have with your health care provider. Document Released: 10/10/2004 Document Revised: 07/09/2017 Document Reviewed: 07/09/2017 Elsevier Interactive Patient Education  2019 [employer]  

## 2018-01-28 ENCOUNTER — Ambulatory Visit: Payer: Medicare PPO | Admitting: Family Medicine

## 2018-01-28 ENCOUNTER — Ambulatory Visit: Payer: Self-pay

## 2018-01-28 ENCOUNTER — Encounter: Payer: Self-pay | Admitting: Family Medicine

## 2018-01-28 VITALS — BP 122/78 | Ht 65.0 in | Wt 178.0 lb

## 2018-01-28 DIAGNOSIS — M75102 Unspecified rotator cuff tear or rupture of left shoulder, not specified as traumatic: Secondary | ICD-10-CM

## 2018-01-28 DIAGNOSIS — G8929 Other chronic pain: Secondary | ICD-10-CM

## 2018-01-28 DIAGNOSIS — M25512 Pain in left shoulder: Secondary | ICD-10-CM | POA: Diagnosis not present

## 2018-01-28 DIAGNOSIS — M7502 Adhesive capsulitis of left shoulder: Secondary | ICD-10-CM | POA: Diagnosis not present

## 2018-01-28 DIAGNOSIS — M75100 Unspecified rotator cuff tear or rupture of unspecified shoulder, not specified as traumatic: Secondary | ICD-10-CM | POA: Insufficient documentation

## 2018-01-28 DIAGNOSIS — M12819 Other specific arthropathies, not elsewhere classified, unspecified shoulder: Secondary | ICD-10-CM | POA: Insufficient documentation

## 2018-01-28 DIAGNOSIS — M12812 Other specific arthropathies, not elsewhere classified, left shoulder: Secondary | ICD-10-CM

## 2018-01-28 NOTE — Patient Instructions (Signed)
Good to see you  Ice 20 minutes 2 times daily. Usually after activity and before bed. pennsaid pinkie amount topically 2 times daily as needed.  Keep hands within peripheral vision most of the time Exercises 3 times a week.  Over the counter get  Vitamin D 2000 IU daily  Tart cherry extract any dose at night See me again in 6 weeks

## 2018-01-28 NOTE — Assessment & Plan Note (Signed)
Patient does have a very large degenerative tear noted.  I do believe that some of the decreased range of motion though is secondary to some mild frozen shoulder as well.  We discussed icing regimen, home exercise, which activities to do which wants to avoid.  Patient is to increase activity slowly over the course the next several days.  Follow-up with me again in 6 weeks.  No improvement will consider formal physical therapy as well as injections.

## 2018-01-28 NOTE — Progress Notes (Addendum)
[facility] [address] [place], [place] [address] Phone: [phone] Subjective:   Deborah Chung, am serving as a scribe for Dr. Antoine Primas.  I'm seeing this patient by the request  TG:YBWLS, Bernadene Bell, MD     CC: Left shoulder pain  LHT:[id number]  Deborah Chung is a 85 y.o. female coming in with complaint of chronic left shoulder pain. Has had pain for several months following an exercise class. Anterior shoulder pain down to the elbow. IR is most painful. Pain also with horizontal abduction. Has not tried any modalities to alleviate her pain.     Patient did have left x-rays done December 25, 2017.  These were independently belies by [email or web address] AC joint OA otherwise normal with pacemaker   Past Medical History:  Diagnosis Date  . Anxiety   . Atrial fibrillation (HCC)   . Blood transfusion without reported diagnosis   . GAD (generalized anxiety disorder)   . Hearing loss   . Hyperlipidemia   . Hypertension   . PHN (postherpetic neuralgia) 2010  . Systolic CHF [facility])    Past Surgical History:  Procedure Laterality Date  . ABDOMINAL HYSTERECTOMY    . PACEMAKER PLACEMENT    . TOTAL KNEE ARTHROPLASTY Bilateral    Social History   Socioeconomic History  . Marital status: Widowed    Spouse name: Not on file  . Number of children: 3  . Years of education: Not on file  . Highest education level: Not on file  Occupational History  . Occupation: retired  Engineer, production  . Financial resource strain: Not hard at all  . Food insecurity:    Worry: Never true    Inability: Never true  . Transportation needs:    Medical: No    Non-medical: No  Tobacco Use  . Smoking status: Former Smoker    Years: 25.00    Last attempt to quit: 08/03/1988    Years since quitting: 29.5  . Smokeless tobacco: Never Used  Substance and Sexual Activity  . Alcohol use: Not Currently  . Drug use: No  . Sexual activity: Never  Lifestyle  . Physical activity:   Days per week: 0 days    Minutes per session: 0 min  . Stress: Not at all  Relationships  . Social connections:    Talks on phone: More than three times a week    Gets together: More than three times a week    Attends religious service: More than 4 times per year    Active member of club or organization: Yes    Attends meetings of clubs or organizations: More than 4 times per year    Relationship status: Widowed  Other Topics Concern  . Not on file  Social History Narrative  . Not on file   No Known Allergies Family History  Problem Relation Age of Onset  . Heart disease Father   . Heart disease Sister      Current Outpatient Medications (Cardiovascular):  .  metoprolol tartrate (LOPRESSOR) 25 MG tablet, TAKE 1 TABLET BY MOUTH TWICE A DAY .  pravastatin (PRAVACHOL) 20 MG tablet, Take 1 tablet (20 mg total) by mouth daily. Marland Kitchen  spironolactone (ALDACTONE) 25 MG tablet, TAKE 1 TABLET BY MOUTH EVERY DAY   Current Outpatient Medications (Analgesics):  .  aspirin 81 MG tablet, Take 81 mg by mouth daily.  Current Outpatient Medications (Hematological):  Marland Kitchen  ELIQUIS 2.5 MG TABS tablet, TAKE 1 TABLET BY  MOUTH TWICE A DAY  Current Outpatient Medications (Other):  Marland Kitchen.  ALPRAZolam (XANAX) 0.25 MG tablet, TAKE 1 TABLET BY MOUTH AS NEEDED FOR ANXIETY .  BIOTIN PO, Take by mouth daily. Marland Kitchen.  CREON 12000 units CPEP capsule, TAKE 2 CAPSULES BY MOUTH 4 TIMES A DAY .  dexlansoprazole (DEXILANT) 60 MG capsule, Take 1 capsule (60 mg total) by mouth daily. .  diphenoxylate-atropine (LOMOTIL) 2.5-0.025 MG tablet, TAKE 1 TABLET BY MOUTH TWICE A DAY .  DULoxetine (CYMBALTA) 60 MG capsule, Take 1 capsule (60 mg total) by mouth daily. .  famotidine (PEPCID) 20 MG tablet, Take 20 mg by mouth 2 (two) times daily. Marland Kitchen.  LYRICA 75 MG capsule, TAKE 1 CAPSULE BY MOUTH THREE TIMES A DAY .  Omega-3 Fatty Acids (OMEGA-3 FISH OIL PO), Take by mouth. .  Probiotic Product (PROBIOTIC PO), Take 1 tablet by mouth daily. .   traZODone (DESYREL) 50 MG tablet, Take 2 tablets (100 mg total) by mouth at bedtime as needed for sleep.    Past medical history, social, surgical and family history all reviewed in electronic medical record.  No pertanent information unless stated regarding to the chief complaint.   Review of Systems:  No headache, visual changes, nausea, vomiting, diarrhea, constipation, dizziness, abdominal pain, skin rash, fevers, chills, night sweats, weight loss, swollen lymph nodes, body aches, joint swelling, muscle aches, chest pain, shortness of breath, mood changes.   Objective  There were no vitals taken for this visit. Systems examined below as of    General: No apparent distress alert and oriented x3 mood and affect normal, dressed appropriately.  Asymmetry of the left side of the face noted HEENT: Pupils equal, extraocular movements intact  Respiratory: Patient's speak in full sentences and does not appear short of breath  Cardiovascular: No lower extremity edema, non tender, no erythema  Skin: Warm dry intact with no signs of infection or rash on extremities or on axial skeleton.  Abdomen: Soft mildly distended Neuro: Cranial nerves II through XII are intact, neurovascularly intact in all extremities with 2+ DTRs and 2+ pulses.  Lymph: No lymphadenopathy of posterior or anterior cervical chain or axillae bilaterally.  Gait n severely antalgic with patient having instability of the left knee MSK:  tender with limited range of motion and stability and symmetric strength and tone of = elbows, wrist, hip, knee and ankles bilaterally.  Shoulder: left Inspection reveals Palpation is normal with no tenderness over AC joint or bicipital groove. ROM is full in all planes passively. Rotator cuff strength normal throughout. signs of impingement with positive Neer and Hawkin's tests, but negative empty can sign. Speeds and Yergason's tests normal. No labral pathology noted with negative Obrien's,  negative clunk and good stability. Normal scapular function observed. No painful arc and no drop arm sign. No apprehension sign  MSK US performed of: left This study was ordered, performed, and interpreted by Terrilee FilesZach  D.O.  Shoulder:   Supraspinatus: Large chronic degenerative tear with that seems to be full-thickness of the supraspinatus with calcific changes bursal bulge seen with shoulder abduction on impingement view. Infraspinatus:  Appears normal on long and transverse views. Significant increase in Doppler flow Subscapularis:  Appears normal on long and transverse views. Positive bursa Teres Minor: Severe tearing noted. AC joint: Moderate arthritis Glenohumeral Joint:  Amild OA changes  Glenoid Labrum:  Intact without visualized tears. Biceps Tendon:  Appears normal on long and transverse views, no fraying of tendon, tendon located in intertubercular groove, no  subluxation with shoulder internal or external rotation.  Impression: Degenerative rotator cuff tear  17408; 15 additional minutes spent for Therapeutic exercises as stated in above notes.  This included exercises focusing on stretching, strengthening, with significant focus on eccentric aspects.   Long term goals include an improvement in range of motion, strength, endurance as well as avoiding reinjury. Patient's frequency would include in 1-2 times a day, 3-5 times a week for a duration of 6-12 weeks. Shoulder Exercises that included:  Basic scapular stabilization to include adduction and depression of scapula Scaption, focusing on proper movement and good control Internal and External rotation utilizing a theraband, with elbow tucked at side entire time Rows with theraband which was given   Proper technique shown and discussed handout in great detail with ATC.  All questions were discussed and answered.     Impression and Recommendations:     This case required medical decision making of moderate complexity. The  above documentation has been reviewed and is accurate and complete Judi Saa, DO       Note: This dictation was prepared with Dragon dictation along with smaller phrase technology. Any transcriptional errors that result from this process are unintentional.

## 2018-02-07 LAB — CUP PACEART INCLINIC DEVICE CHECK
Brady Statistic RA Percent Paced: 92 %
Brady Statistic RV Percent Paced: 1 %
Date Time Interrogation Session: 20200125111335
Implantable Lead Implant Date: 20150112
Implantable Lead Implant Date: 20150112
Implantable Lead Location: 753859
Implantable Lead Location: 753860
Implantable Lead Model: 4135
Implantable Lead Model: 4136
Implantable Lead Serial Number: 29308444
Implantable Lead Serial Number: 29569556
Implantable Pulse Generator Implant Date: 20150112
Lead Channel Pacing Threshold Amplitude: 0.7 V
Lead Channel Pacing Threshold Amplitude: 0.8 V
Lead Channel Pacing Threshold Pulse Width: 0.4 ms
Lead Channel Pacing Threshold Pulse Width: 0.5 ms
Lead Channel Sensing Intrinsic Amplitude: 16.4 mV
Lead Channel Setting Pacing Amplitude: 1.1 V
Lead Channel Setting Pacing Amplitude: 2.4 V
Lead Channel Setting Pacing Pulse Width: 0.4 ms
Lead Channel Setting Sensing Sensitivity: 3 mV
Pulse Gen Serial Number: 391753

## 2018-02-22 ENCOUNTER — Encounter (HOSPITAL_COMMUNITY): Payer: Self-pay | Admitting: Emergency Medicine

## 2018-02-22 ENCOUNTER — Emergency Department (HOSPITAL_COMMUNITY)
Admission: EM | Admit: 2018-02-22 | Discharge: 2018-02-22 | Disposition: A | Discharge status: Home / Self Care | Payer: Medicare PPO | Attending: Emergency Medicine | Admitting: Emergency Medicine

## 2018-02-22 ENCOUNTER — Other Ambulatory Visit: Payer: Self-pay

## 2018-02-22 ENCOUNTER — Emergency Department (HOSPITAL_COMMUNITY): Payer: Medicare PPO

## 2018-02-22 DIAGNOSIS — Z950 Presence of cardiac pacemaker: Secondary | ICD-10-CM | POA: Insufficient documentation

## 2018-02-22 DIAGNOSIS — Z7982 Long term (current) use of aspirin: Secondary | ICD-10-CM | POA: Diagnosis not present

## 2018-02-22 DIAGNOSIS — I110 Hypertensive heart disease with heart failure: Secondary | ICD-10-CM | POA: Insufficient documentation

## 2018-02-22 DIAGNOSIS — W01198A Fall on same level from slipping, tripping and stumbling with subsequent striking against other object, initial encounter: Secondary | ICD-10-CM | POA: Insufficient documentation

## 2018-02-22 DIAGNOSIS — S0101XA Laceration without foreign body of scalp, initial encounter: Secondary | ICD-10-CM | POA: Diagnosis present

## 2018-02-22 DIAGNOSIS — I5022 Chronic systolic (congestive) heart failure: Secondary | ICD-10-CM | POA: Diagnosis not present

## 2018-02-22 DIAGNOSIS — Y999 Unspecified external cause status: Secondary | ICD-10-CM | POA: Insufficient documentation

## 2018-02-22 DIAGNOSIS — Z87891 Personal history of nicotine dependence: Secondary | ICD-10-CM | POA: Insufficient documentation

## 2018-02-22 DIAGNOSIS — Y929 Unspecified place or not applicable: Secondary | ICD-10-CM | POA: Diagnosis not present

## 2018-02-22 DIAGNOSIS — W010XXA Fall on same level from slipping, tripping and stumbling without subsequent striking against object, initial encounter: Secondary | ICD-10-CM

## 2018-02-22 DIAGNOSIS — Z96653 Presence of artificial knee joint, bilateral: Secondary | ICD-10-CM | POA: Diagnosis not present

## 2018-02-22 DIAGNOSIS — Z79899 Other long term (current) drug therapy: Secondary | ICD-10-CM | POA: Diagnosis not present

## 2018-02-22 DIAGNOSIS — Y939 Activity, unspecified: Secondary | ICD-10-CM | POA: Insufficient documentation

## 2018-02-22 MED ORDER — LIDOCAINE-EPINEPHRINE (PF) 2 %-1:200000 IJ SOLN
10.0000 mL | Freq: Once | INTRAMUSCULAR | Status: AC
  Administered 2018-02-22: 10 mL
  Filled 2018-02-22: qty 20

## 2018-02-22 NOTE — Discharge Instructions (Addendum)
It was our pleasure to provide your ER care today - we hope that you feel better.  Have sutures removed, your doctor or urgent care, in 1 week.   Return to ER if worse, new symptoms, new or severe pain, severe headache, vomiting, infection of wound, other concern.

## 2018-02-22 NOTE — ED Provider Notes (Signed)
[facility] EMERGENCY DEPARTMENT Provider Note   CSN: [id number] Arrival date & time: 02/22/18  1756     History   Chief Complaint Chief Complaint  Patient presents with  . Fall  . Head Laceration    HPI Dejai Ruebush is a 85 y.o. female.  Patient presents s/p mechanical fall. States tripped over feet, fell forward, hit head on edge of rail. Laceration to left scalp with pain to area. Pain dull, mild, constant. Felt fine prior to fall. No faintness or dizziness. Denies any other pain or injury. No neck or back pain. Last tetanus 2015. Is on eliquis.   The history is provided by the patient.  Fall  Pertinent negatives include no chest pain, no abdominal pain, no headaches and no shortness of breath.  Head Laceration  Pertinent negatives include no chest pain, no abdominal pain, no headaches and no shortness of breath.    Past Medical History:  Diagnosis Date  . Anxiety   . Atrial fibrillation (HCC)   . Blood transfusion without reported diagnosis   . GAD (generalized anxiety disorder)   . Hearing loss   . Hyperlipidemia   . Hypertension   . PHN (postherpetic neuralgia) 2010  . Systolic CHF [facility])     Patient Active Problem List   Diagnosis Date Noted  . Rotator cuff tear arthropathy 01/28/2018  . Adhesive capsulitis of left shoulder 01/27/2018  . Primary osteoarthritis involving multiple joints 01/27/2018  . Psychophysiological insomnia 06/24/2017  . Bacterial overgrowth syndrome 08/31/2015  . Non-ischemic cardiomyopathy (HCC) 08/21/2015  . Hyperlipidemia with target LDL less than 100 05/30/2015  . Allergic rhinitis due to pollen 05/30/2015  . Gastroesophageal reflux disease without esophagitis 05/30/2015  . Hyperglycemia 05/30/2015  . Routine general medical examination at a health care facility 05/30/2015  . Pacemaker 08/03/2013  . Atrial fibrillation (HCC) 06/29/2013  . Essential hypertension, benign 06/29/2013  . Hypertriglyceridemia  06/29/2013  . Depression with anxiety 06/29/2013    Past Surgical History:  Procedure Laterality Date  . ABDOMINAL HYSTERECTOMY    . PACEMAKER PLACEMENT    . TOTAL KNEE ARTHROPLASTY Bilateral      OB History    Gravida  3   Para  3   Term  3   Preterm      AB      Living  3     SAB      TAB      Ectopic      Multiple      Live Births  3            Home Medications    Prior to Admission medications   Medication Sig Start Date End Date Taking? Authorizing Provider  ALPRAZolam Prudy Feeler) 0.25 MG tablet TAKE 1 TABLET BY MOUTH AS NEEDED FOR ANXIETY 07/07/17   Etta Grandchild, MD  aspirin 81 MG tablet Take 81 mg by mouth daily.    [provider]  BIOTIN PO Take by mouth daily.    [provider]  CREON 12000 units CPEP capsule TAKE 2 CAPSULES BY MOUTH 4 TIMES A DAY 12/04/17   Rachael Fee, MD  dexlansoprazole (DEXILANT) 60 MG capsule Take 1 capsule (60 mg total) by mouth daily. 01/27/18   Etta Grandchild, MD  diphenoxylate-atropine (LOMOTIL) 2.5-0.025 MG tablet TAKE 1 TABLET BY MOUTH TWICE A DAY 04/30/17   Rachael Fee, MD  DULoxetine (CYMBALTA) 60 MG capsule Take 1 capsule (60 mg total) by mouth daily. 01/27/18  Etta GrandchildJones, Thomas L, MD  ELIQUIS 2.5 MG TABS tablet TAKE 1 TABLET BY MOUTH TWICE A DAY 12/04/17   Etta GrandchildJones, Thomas L, MD  LYRICA 75 MG capsule TAKE 1 CAPSULE BY MOUTH THREE TIMES A DAY 12/04/17   Etta GrandchildJones, Thomas L, MD  metoprolol tartrate (LOPRESSOR) 25 MG tablet TAKE 1 TABLET BY MOUTH TWICE A DAY 01/13/18   Etta GrandchildJones, Thomas L, MD  Omega-3 Fatty Acids (OMEGA-3 FISH OIL PO) Take by mouth.    [provider]  pravastatin (PRAVACHOL) 20 MG tablet Take 1 tablet (20 mg total) by mouth daily. 06/24/17   Etta GrandchildJones, Thomas L, MD  Probiotic Product (PROBIOTIC PO) Take 1 tablet by mouth daily.    [provider]  spironolactone (ALDACTONE) 25 MG tablet TAKE 1 TABLET BY MOUTH EVERY DAY 12/12/17   Etta GrandchildJones, Thomas L, MD  traZODone (DESYREL) 50 MG  tablet Take 2 tablets (100 mg total) by mouth at bedtime as needed for sleep. 12/24/17   Etta GrandchildJones, Thomas L, MD    Family History Family History  Problem Relation Age of Onset  . Heart disease Father   . Heart disease Sister     Social History Social History   Tobacco Use  . Smoking status: Former Smoker    Years: 25.00    Last attempt to quit: 08/03/1988    Years since quitting: 29.5  . Smokeless tobacco: Never Used  Substance Use Topics  . Alcohol use: Not Currently  . Drug use: No     Allergies   Patient has no known allergies.   Review of Systems Review of Systems  Constitutional: Negative for fever.  HENT: Negative for nosebleeds.   Eyes: Negative for pain.  Respiratory: Negative for shortness of breath.   Cardiovascular: Negative for chest pain.  Gastrointestinal: Negative for abdominal pain and vomiting.  Genitourinary: Negative for flank pain.  Musculoskeletal: Negative for back pain and neck pain.  Skin: Positive for wound.  Neurological: Negative for weakness, numbness and headaches.  Hematological:       On eliquis  Psychiatric/Behavioral: Negative for confusion.     Physical Exam Updated Vital Signs BP 129/61 (BP Location: Left Arm)   Pulse 70   Temp 97.6 F (36.4 C) (Oral)   Resp 16   SpO2 96%   Physical Exam   ED Treatments / Results  Labs (all labs ordered are listed, but only abnormal results are displayed) Labs Reviewed - No data to display  EKG None  Radiology Ct Head Wo Contrast  Result Date: 02/22/2018 CLINICAL DATA:  Mechanical fall, left head injury, on Eliquis EXAM: CT HEAD WITHOUT CONTRAST CT CERVICAL SPINE WITHOUT CONTRAST TECHNIQUE: Multidetector CT imaging of the head and cervical spine was performed following the standard protocol without intravenous contrast. Multiplanar CT image reconstructions of the cervical spine were also generated. COMPARISON:  None. FINDINGS: CT HEAD FINDINGS Brain: No evidence of acute infarction,  hemorrhage, hydrocephalus, extra-axial collection or mass lesion/mass effect. Subcortical white matter and periventricular small vessel ischemic changes. Vascular: Intracranial atherosclerosis. Skull: Normal. Negative for fracture or focal lesion. Sinuses/Orbits: The visualized paranasal sinuses are essentially clear. The mastoid air cells are unopacified. Other: None. CT CERVICAL SPINE FINDINGS Alignment: Normal cervical lordosis. Skull base and vertebrae: No acute fracture. No primary bone lesion or focal pathologic process. Soft tissues and spinal canal: No prevertebral fluid or swelling. No visible canal hematoma. Disc levels: Mild degenerative changes of the mid/lower cervical spine. Spinal canal is patent. Upper chest: Right apical pleural-parenchymal scarring. Other:  Visualized thyroid is unremarkable. IMPRESSION: No evidence of acute intracranial abnormality. Small vessel ischemic changes. No evidence of traumatic injury to the cervical spine. Mild degenerative changes. Electronically Signed   By: Charline Bills M.D.   On: 02/22/2018 19:03   Ct Cervical Spine Wo Contrast  Result Date: 02/22/2018 CLINICAL DATA:  Mechanical fall, left head injury, on Eliquis EXAM: CT HEAD WITHOUT CONTRAST CT CERVICAL SPINE WITHOUT CONTRAST TECHNIQUE: Multidetector CT imaging of the head and cervical spine was performed following the standard protocol without intravenous contrast. Multiplanar CT image reconstructions of the cervical spine were also generated. COMPARISON:  None. FINDINGS: CT HEAD FINDINGS Brain: No evidence of acute infarction, hemorrhage, hydrocephalus, extra-axial collection or mass lesion/mass effect. Subcortical white matter and periventricular small vessel ischemic changes. Vascular: Intracranial atherosclerosis. Skull: Normal. Negative for fracture or focal lesion. Sinuses/Orbits: The visualized paranasal sinuses are essentially clear. The mastoid air cells are unopacified. Other: None. CT CERVICAL  SPINE FINDINGS Alignment: Normal cervical lordosis. Skull base and vertebrae: No acute fracture. No primary bone lesion or focal pathologic process. Soft tissues and spinal canal: No prevertebral fluid or swelling. No visible canal hematoma. Disc levels: Mild degenerative changes of the mid/lower cervical spine. Spinal canal is patent. Upper chest: Right apical pleural-parenchymal scarring. Other: Visualized thyroid is unremarkable. IMPRESSION: No evidence of acute intracranial abnormality. Small vessel ischemic changes. No evidence of traumatic injury to the cervical spine. Mild degenerative changes. Electronically Signed   By: Charline Bills M.D.   On: 02/22/2018 19:03    Procedures .Marland KitchenLaceration Repair Date/Time: 02/22/2018 7:16 PM Performed by: Cathren Laine, MD Authorized by: Cathren Laine, MD   Consent:    Consent given by:  Patient Laceration details:    Location:  Scalp   Length (cm):  1.5 Repair type:    Repair type:  Simple Pre-procedure details:    Preparation:  Patient was prepped and draped in usual sterile fashion Exploration:    Wound exploration: wound explored through full range of motion and entire depth of wound probed and visualized     Contaminated: no   Treatment:    Area cleansed with:  Betadine   Amount of cleaning:  Standard   Irrigation solution:  Sterile saline Skin repair:    Repair method:  Sutures   Suture size:  4-0   Suture material:  Prolene   Suture technique:  Simple interrupted   Number of sutures:  2 Post-procedure details:    Patient tolerance of procedure:  Tolerated well, no immediate complications   (including critical care time)  Medications Ordered in ED Medications - No data to display   Initial Impression / Assessment and Plan / ED Course  I have reviewed the triage vital signs and the nursing notes.  Pertinent labs & imaging results that were available during my care of the patient were reviewed by me and considered in my  medical decision making (see chart for details).  Ct.  Reviewed nursing notes and prior charts for additional history.   Wound sutured.  No new c/o. No pain. No headache. No nv.   cts reviewed - no fx/hem.  Patient appears stable for d/c.     Final Clinical Impressions(s) / ED Diagnoses   Final diagnoses:  None    ED Discharge Orders    None       Cathren Laine, MD 02/22/18 [phone]

## 2018-02-22 NOTE — ED Triage Notes (Signed)
Pt reports mechanical fall and hit the left side of her head. Pt states that she take a blood thinner but does not know what it is. Denies neck pain and LOC.

## 2018-02-22 NOTE — ED Notes (Signed)
Patient transported to CT 

## 2018-02-22 NOTE — ED Notes (Signed)
Patient verbalizes understanding of discharge instructions. Opportunity for questioning and answers were provided. Armband removed by staff, pt discharged from ED.  

## 2018-02-25 ENCOUNTER — Ambulatory Visit (INDEPENDENT_AMBULATORY_CARE_PROVIDER_SITE_OTHER): Payer: Medicare PPO

## 2018-02-25 DIAGNOSIS — I480 Paroxysmal atrial fibrillation: Secondary | ICD-10-CM

## 2018-02-25 LAB — CUP PACEART REMOTE DEVICE CHECK
Battery Remaining Longevity: 42 mo
Battery Remaining Percentage: 62 %
Brady Statistic RA Percent Paced: 88 %
Brady Statistic RV Percent Paced: 1 %
Date Time Interrogation Session: 20200212082400
Implantable Lead Implant Date: 20150112
Implantable Lead Implant Date: 20150112
Implantable Lead Location: 753859
Implantable Lead Location: 753860
Implantable Lead Model: 4135
Implantable Lead Model: 4136
Implantable Lead Serial Number: 29308444
Implantable Lead Serial Number: 29569556
Implantable Pulse Generator Implant Date: 20150112
Lead Channel Impedance Value: 537 Ohm
Lead Channel Impedance Value: 851 Ohm
Lead Channel Pacing Threshold Amplitude: 0.7 V
Lead Channel Pacing Threshold Amplitude: 0.8 V
Lead Channel Pacing Threshold Pulse Width: 0.4 ms
Lead Channel Pacing Threshold Pulse Width: 0.5 ms
Lead Channel Setting Pacing Amplitude: 1.2 V
Lead Channel Setting Pacing Amplitude: 2.4 V
Lead Channel Setting Pacing Pulse Width: 0.4 ms
Lead Channel Setting Sensing Sensitivity: 3 mV
Pulse Gen Serial Number: 391753

## 2018-03-01 ENCOUNTER — Ambulatory Visit: Payer: Self-pay | Admitting: Nurse Practitioner

## 2018-03-01 VITALS — BP 116/70 | HR 67 | Temp 98.0°F

## 2018-03-01 DIAGNOSIS — Z4802 Encounter for removal of sutures: Secondary | ICD-10-CM

## 2018-03-01 NOTE — Patient Instructions (Signed)
Suture Removal, Care After  This sheet gives you information about how to care for yourself after your procedure. Your health care provider may also give you more specific instructions. If you have problems or questions, contact your health care provider.  What can I expect after the procedure?  After your stitches (sutures) are removed, it is common to have:  · Some discomfort and swelling in the area.  · Slight redness in the area.  Follow these instructions at home:  If you have a bandage:  · Wash your hands with soap and water before you change your bandage (dressing). If soap and water are not available, use hand sanitizer.  · Change your dressing as told by your health care provider. If your dressing becomes wet or dirty, or develops a bad smell, change it as soon as possible.  · If your dressing sticks to your skin, soak it in warm water to loosen it.  Wound care    · Check your wound every day for signs of infection. Check for:  ? More redness, swelling, or pain.  ? Fluid or blood.  ? Warmth.  ? Pus or a bad smell.  · Wash your hands with soap and water before and after touching your wound.  · Apply cream or ointment only as directed by your health care provider. If you are using cream or ointment, wash the area with soap and water 2 times a day to remove all the cream or ointment. Rinse off the soap and pat the area dry with a clean towel.  · If you have skin glue or adhesive strips on your wound, leave these closures in place. They may need to stay in place for 2 weeks or longer. If adhesive strip edges start to loosen and curl up, you may trim the loose edges. Do not remove adhesive strips completely unless your health care provider tells you to do that.  · Keep the wound area dry and clean. Do not take baths, swim, or use a hot tub until your health care provider approves.  · Continue to protect the wound from injury.  · Do not pick at your wound. Picking can cause an infection.  · When your wound has  completely healed, wear sunscreen over it or cover it with clothing when you are outside. New scars get sunburned easily, which can make scarring worse.  General instructions  · Take over-the-counter and prescription medicines only as told by your health care provider.  · Keep all follow-up visits as told by your health care provider. This is important.  Contact a health care provider if:  · You have redness, swelling, or pain around your wound.  · You have fluid or blood coming from your wound.  · Your wound feels warm to the touch.  · You have pus or a bad smell coming from your wound.  · Your wound opens up.  Get help right away if:  · You have a fever.  · You have redness that is spreading from your wound.  Summary  · After your sutures are removed, it is common to have some discomfort and swelling in the area.  · Wash your hands with soap and water before you change your bandage (dressing).  · Keep the wound area dry and clean. Do not take baths, swim, or use a hot tub until your health care provider approves.  This information is not intended to replace advice given to you by your health care   provider. Make sure you discuss any questions you have with your health care provider.  Document Released: 09/25/2000 Document Revised: 02/06/2016 Document Reviewed: 02/06/2016  Elsevier Interactive Patient Education © 2019 [employer]

## 2018-03-01 NOTE — Progress Notes (Signed)
Subjective:    Deborah Chung is a 85 y.o. female who obtained a laceration 1 week ago, which required closure with 2 sutures. Mechanism of injury: fall . She denies pain, redness, or drainage from the wound. Her last tetanus was 5 years ago.  Patient is on blood thinners, but has not had any complications at this time.  Patient informs she went to the ED and a head CT was performed with no complications.  Patient informed she has not had any signs of dizziness, lightheadedness, bleeding, headache, or abdominal pain.  Patient does have a significant medical history, which was reviewed.  The following portions of the patient's history were reviewed and updated as appropriate: allergies, current medications and past medical history.  Past Medical History:  Diagnosis Date  . Anxiety   . Atrial fibrillation (HCC)   . Blood transfusion without reported diagnosis   . GAD (generalized anxiety disorder)   . Hearing loss   . Hyperlipidemia   . Hypertension   . PHN (postherpetic neuralgia) 2010  . Systolic CHF (HCC)     Review of Systems Constitutional: negative Ears, nose, mouth, throat, and face: negative Respiratory: negative Cardiovascular: negative Neurological: negative    Objective:    BP 116/70 (BP Location: Right Arm, Patient Position: Sitting, Cuff Size: Normal)   Pulse (!) 48   Temp 98 F (36.7 C) (Oral)   SpO2 96%   Physical Exam Vitals signs reviewed.  Constitutional:      General: She is not in acute distress.    Appearance: Normal appearance.  HENT:     Head: Normocephalic.     Comments: Wound to temporal region of left scalp, measures approximately 1.5cm    Right Ear: Tympanic membrane, ear canal and external ear normal.     Left Ear: Tympanic membrane, ear canal and external ear normal.     Nose: Nose normal.     Mouth/Throat:     Mouth: Mucous membranes are moist.  Eyes:     Pupils: Pupils are equal, round, and reactive to light.  Neck:     Musculoskeletal:  Normal range of motion and neck supple.  Cardiovascular:     Rate and Rhythm: Normal rate.     Pulses: Normal pulses.     Heart sounds: Normal heart sounds.     Comments: HR was repeated: 67, patient has a pacemaker. Pulmonary:     Effort: Pulmonary effort is normal. No respiratory distress.     Breath sounds: Normal breath sounds. No stridor. No wheezing, rhonchi or rales.  Skin:    General: Skin is warm and dry.  Neurological:     General: No focal deficit present.     Mental Status: She is alert and oriented to person, place, and time.     Cranial Nerves: No cranial nerve deficit.     Motor: No weakness.     Coordination: Coordination normal.     Gait: Gait normal.  Psychiatric:        Mood and Affect: Mood normal.        Thought Content: Thought content normal.   Injury exam:  A 1.5 cm laceration noted on the left scalp, temporal region.  2 sutures were removed without complication.  Area is healing well, without evidence of infection.  Stitches removed very easily, and did not exhibit redness, drainage, tenderness, or signs of infection.  Patient tolerated well.  There is a scab to the area, that is well approximated, edges are intact.  Assessment:    Laceration is healing well, without evidence of infection.    Plan:   Exam findings, diagnosis etiology and medication use and indications reviewed with patient. Follow- Up and discharge instructions provided. No emergent/urgent issues found on exam.  Suture removal was performed without complication.  Stitches removed very easily, and did not exhibit redness, drainage, tenderness, or signs of infection.  Informed the patient to continue to keep the area clean and dry.  Instructed the patient not to disrupt the scab to the area.  Instructed the patient that if she noticed any foul-smelling drainage, she developed fever, chills, abdominal pain, or malaise, to follow-up in the emergency department.  Patient tolerated well.  Patient  education was provided. Patient verbalized understanding of information provided and agrees with plan of care (POC), all questions answered. The patient is advised to call or return to clinic if condition does not see an improvement in symptoms, or to seek the care of the closest emergency department if condition worsens with the above plan.   1. Visit for suture removal

## 2018-03-09 NOTE — Progress Notes (Signed)
Remote pacemaker transmission.   

## 2018-03-10 ENCOUNTER — Ambulatory Visit (INDEPENDENT_AMBULATORY_CARE_PROVIDER_SITE_OTHER): Payer: Medicare PPO | Admitting: Internal Medicine

## 2018-03-10 ENCOUNTER — Encounter: Payer: Self-pay | Admitting: Internal Medicine

## 2018-03-10 VITALS — BP 111/63 | HR 70 | Ht 64.0 in | Wt 177.4 lb

## 2018-03-10 DIAGNOSIS — E785 Hyperlipidemia, unspecified: Secondary | ICD-10-CM

## 2018-03-10 DIAGNOSIS — I480 Paroxysmal atrial fibrillation: Secondary | ICD-10-CM | POA: Diagnosis not present

## 2018-03-10 DIAGNOSIS — I495 Sick sinus syndrome: Secondary | ICD-10-CM | POA: Diagnosis not present

## 2018-03-10 DIAGNOSIS — Z950 Presence of cardiac pacemaker: Secondary | ICD-10-CM | POA: Diagnosis not present

## 2018-03-10 DIAGNOSIS — I10 Essential (primary) hypertension: Secondary | ICD-10-CM

## 2018-03-10 NOTE — Patient Instructions (Signed)
Medication Instructions:  Stop Aspirin If you need a refill on your cardiac medications before your next appointment, please call your pharmacy.   Lab work: None ordered   Testing/Procedures: None ordered  Follow-Up: At [facility], you and your health needs are our priority.  As part of our continuing mission to provide you with exceptional heart care, we have created designated Provider Care Teams.  These Care Teams include your primary Cardiologist (physician) and Advanced Practice Providers (APPs -  Physician Assistants and Nurse Practitioners) who all work together to provide you with the care you need, when you need it. . Follow Up with Dr.Hilty in 1 year  Call 3 months before to schedule

## 2018-03-11 ENCOUNTER — Telehealth: Payer: Self-pay | Admitting: Gastroenterology

## 2018-03-11 ENCOUNTER — Ambulatory Visit: Payer: Medicare PPO | Admitting: Family Medicine

## 2018-03-11 NOTE — Telephone Encounter (Signed)
Pt called in wanting to speak with the nurse. She advised that she is having that bad diarrhea again and it wont stop and is needing some advice.

## 2018-03-11 NOTE — Telephone Encounter (Signed)
The pt began to have 3-4 Very loose watery stools 1 week ago and  has tried to take lomotil twice daily with no response.  No sick contacts, no recent antibiotics.  No abd pain or bleeding.  Has not had miralax.  She is taking creon 12000 units 2 with every meal and 1 with snacks.  Cymbalta and dexilant are new medications for her. Has been scheduled to see Hyacinth Meeker on Friday.

## 2018-03-13 ENCOUNTER — Other Ambulatory Visit: Payer: Medicare PPO

## 2018-03-13 ENCOUNTER — Ambulatory Visit: Payer: Medicare PPO | Admitting: Physician Assistant

## 2018-03-13 ENCOUNTER — Encounter: Payer: Self-pay | Admitting: Physician Assistant

## 2018-03-13 VITALS — BP 114/62 | HR 64 | Ht 64.5 in | Wt 173.2 lb

## 2018-03-13 DIAGNOSIS — K8689 Other specified diseases of pancreas: Secondary | ICD-10-CM | POA: Diagnosis not present

## 2018-03-13 DIAGNOSIS — R197 Diarrhea, unspecified: Secondary | ICD-10-CM | POA: Diagnosis not present

## 2018-03-13 MED ORDER — FAMOTIDINE 20 MG PO TABS: 20.0000 mg | ORAL_TABLET | Freq: Every day | ORAL | 1 refills | Status: DC

## 2018-03-13 MED ORDER — PANCRELIPASE (LIP-PROT-AMYL) 36000-114000 UNITS PO CPEP: ORAL_CAPSULE | ORAL | 1 refills | Status: DC

## 2018-03-13 NOTE — Progress Notes (Signed)
Chief Complaint: Follow-up diarrhea  Review of pertinent gastrointestinal problems: 1. Presumed pancreatic insufficiency from chronic pancreatitis.  CT scan 09/2015 "The liver is otherwise unremarkable with there is no calcified gallstone or pericholecystic fluid. The common bile duct is within upper limits of normal and measures 9 mm in diameter. No CBD stone identified.  Scattered coarse pancreatic calcifications sequela of chronic pancreatitis."  Labs 2017: shows celiac sprue testing negative, GI pathogen panel was negative, fecal lactoferrin was positive, CBC and complete metabolic profile were both normal.  HPI:    Deborah Chung is an 85 year old Caucasian female with past medical history as listed below including A. fib and chronic calcific pancreatitis based on imaging, known to Dr. Christella Hartigan, who presents clinic today for follow-up for diarrhea.    09/17/2017 office visit with Dr. Christella Hartigan.  At that time patient had been taking Creon regularly for the past year.  Described taking Lomotil occasionally if she "swinging to the looser side" and MiraLAX if she "swinging to the constipated side".  Also described worse gas in the evenings with belching and flatulence.  Recent labs in June of that year showed normal CBC, TSH and BMP.  Discussed that she was free to adjust her Lomotil and MiraLAX in small increments as she sees fit.  Recommend she try Gas-X every morning to help with belching and dyspepsia.    03/11/2018 patient called in to explain that she started with very loose watery stools 3-4 times a day about a week ago.  Had taken Lomotil twice with no response.  Taking Creon 12,000 units 2 with a meal and one with a snack, Cymbalta and Dexilant were new medications for her.    Today, the patient explains that over the past week she has had an increase in diarrhea.  If she wats anything or drinks a Boost/Ensure shake she will have 3 loose stools after doing so, up to 10 loose stools a day.  This is also  waking her up at night.  She has had a couple of new medications added including Dexilant since her primary care provider changed her out from Zantac due to cancer warnings and Cymbalta but these were both a couple of months ago.  She has continued to take Creon 12,000 units, 2 with a meal and one with a snack but tells me that she has not been eating recently due to this diarrhea, so she has not been taking them.  Believes she is taking Lomotil twice a day (but there is some confusion when going over her medications).  Associated symptoms include some lower abdominal cramping and discomfort before a loose stool which is resolved afterwards.    Denies fever, chills, weight loss, blood in her stool, sick contacts, heartburn or reflux.  Past Medical History:  Diagnosis Date  . Anxiety   . Atrial fibrillation (HCC)   . Blood transfusion without reported diagnosis   . GAD (generalized anxiety disorder)   . Hearing loss   . Hyperlipidemia   . Hypertension   . PHN (postherpetic neuralgia) 2010  . Systolic CHF [facility])     Past Surgical History:  Procedure Laterality Date  . ABDOMINAL HYSTERECTOMY    . PACEMAKER PLACEMENT    . TOTAL KNEE ARTHROPLASTY Bilateral     Current Outpatient Medications  Medication Sig Dispense Refill  . ALPRAZolam (XANAX) 0.25 MG tablet TAKE 1 TABLET BY MOUTH AS NEEDED FOR ANXIETY 30 tablet 5  . aspirin 81 MG tablet Take 81 mg by mouth  daily.    Marland Kitchen BIOTIN PO Take by mouth daily.    Marland Kitchen CREON 12000 units CPEP capsule TAKE 2 CAPSULES BY MOUTH 4 TIMES A DAY 720 capsule 3  . dexlansoprazole (DEXILANT) 60 MG capsule Take 1 capsule (60 mg total) by mouth daily. 90 capsule 1  . diphenoxylate-atropine (LOMOTIL) 2.5-0.025 MG tablet TAKE 1 TABLET BY MOUTH TWICE A DAY 60 tablet 1  . DULoxetine (CYMBALTA) 60 MG capsule Take 1 capsule (60 mg total) by mouth daily. 90 capsule 1  . ELIQUIS 2.5 MG TABS tablet TAKE 1 TABLET BY MOUTH TWICE A DAY 180 tablet 1  . LYRICA 75 MG capsule TAKE 1  CAPSULE BY MOUTH THREE TIMES A DAY 270 capsule 1  . metoprolol tartrate (LOPRESSOR) 25 MG tablet TAKE 1 TABLET BY MOUTH TWICE A DAY 180 tablet 1  . Omega-3 Fatty Acids (OMEGA-3 FISH OIL PO) Take by mouth.    . pravastatin (PRAVACHOL) 20 MG tablet Take 1 tablet (20 mg total) by mouth daily. 90 tablet 1  . Probiotic Product (PROBIOTIC PO) Take 1 tablet by mouth daily.    Marland Kitchen spironolactone (ALDACTONE) 25 MG tablet TAKE 1 TABLET BY MOUTH EVERY DAY 90 tablet 1  . traZODone (DESYREL) 50 MG tablet Take 2 tablets (100 mg total) by mouth at bedtime as needed for sleep. 180 tablet 1   No current facility-administered medications for this visit.     Allergies as of 03/13/2018  . (No Known Allergies)    Family History  Problem Relation Age of Onset  . Heart disease Father   . Heart disease Sister     Social History   Socioeconomic History  . Marital status: Widowed    Spouse name: Not on file  . Number of children: 3  . Years of education: Not on file  . Highest education level: Not on file  Occupational History  . Occupation: retired  Engineer, production  . Financial resource strain: Not hard at all  . Food insecurity:    Worry: Never true    Inability: Never true  . Transportation needs:    Medical: No    Non-medical: No  Tobacco Use  . Smoking status: Former Smoker    Years: 25.00    Last attempt to quit: 08/03/1988    Years since quitting: 29.6  . Smokeless tobacco: Never Used  Substance and Sexual Activity  . Alcohol use: Not Currently  . Drug use: No  . Sexual activity: Never  Lifestyle  . Physical activity:    Days per week: 0 days    Minutes per session: 0 min  . Stress: Not at all  Relationships  . Social connections:    Talks on phone: More than three times a week    Gets together: More than three times a week    Attends religious service: More than 4 times per year    Active member of club or organization: Yes    Attends meetings of clubs or organizations: More than  4 times per year    Relationship status: Widowed  . Intimate partner violence:    Fear of current or ex partner: Not on file    Emotionally abused: Not on file    Physically abused: Not on file    Forced sexual activity: Not on file  Other Topics Concern  . Not on file  Social History Narrative  . Not on file    Review of Systems:    Constitutional: No weight loss,  fever or chills Cardiovascular: No chest pain Respiratory: No SOB  Gastrointestinal: See HPI and otherwise negative   Physical Exam:  Vital signs: BP 114/62   Pulse 64   Ht 5' 4.5" (1.638 m)   Wt 173 lb 3.2 oz (78.6 kg)   BMI 29.27 kg/m   Constitutional:   Pleasant Elderly Caucasian female appears to be in NAD, Well developed, Well nourished, alert and cooperative Respiratory: Respirations even and unlabored. Lungs clear to auscultation bilaterally.   No wheezes, crackles, or rhonchi.  Cardiovascular: Normal S1, S2. No MRG. Regular rate and rhythm. No peripheral edema, cyanosis or pallor.  Gastrointestinal:  Soft, nondistended, nontender. No rebound or guarding. Normal bowel sounds. No appreciable masses or hepatomegaly. Msk:  Symmetrical without gross deformities. Without edema, no deformity or joint abnormality. Ambulates with a walker Psychiatric: Demonstrates good judgement and reason without abnormal affect or behaviors.  No recent labs or imaging.  Assessment: 1.  Diarrhea: Thought related to pancreatic insufficiency in the past, patient had been controlled on 12,000 units of Creon 2 with a meal and one with a snack as well as occasional Lomotil, over the past week increase in stools now at least 9 loose stools a day, typically worse after eating accompanied by some lower abdominal discomfort, Dexilant and Cymbalta have been added over the past couple of months; consider relation to Dexilant versus pancreatic insufficiency versus infectious cause  Plan: 1.  Ordered stool studies including a GI pathogen panel  and O&P 2.  Would recommend the patient increase her Creon to 36,000 units, 2 with a meal and 1 with a snack.  We will send in a prescription for this, but she is also okay to use her current Creon, 12,000 units, 6 with a meal and 3 with a snack.  Discussed this in detail at her visit. 3.  Also recommend she discontinue Dexilant and use Famotidine 20 mg daily instead.  Prescription was sent #30 with 11 refills. 4.  Discussed Lomotil in detail with the patient.  She can increase this to every 6 hours if she is having continued loose stools.  Recommend for now that she start taking one before she go to bed to help prevent nighttime awakenings. 5.  Patient was encouraged to at least drink a Boost or Ensure shake throughout the day so that she can take her Creon more often 6.  Patient to call our clinic in 1 week if she continues with symptoms and there is no change after all of the above. 7.  Patient to follow in clinic with Dr. Christella Hartigan or myself as needed in the future.  Hyacinth Meeker, PA-C [facility] 03/13/2018, 2:00 PM  Cc: Etta Grandchild, MD

## 2018-03-13 NOTE — Patient Instructions (Addendum)
Your provider has requested that you go to the basement level for lab work before leaving today. Press "B" on the elevator. The lab is located at the first door on the left as you exit the elevator.  Discontinue Dexilant.  We have sent the following medications to your pharmacy for you to pick up at your convenience: Famotidine 20 mg daily  Increase your creon to 36,000 units- 2 capsules with meals and 1 capsule with each snack.  (you can finish using your 12,000 unit capsules by taking 6 capsules with each meal and 3 capsules with each snack)  You may use lomotil every 6 hours as needed for diarrhea.  Call our office in 1 week if you are not feeling any better.  Please follow up with Dr Christella Hartigan as needed.  If you are age 39 or older, your body mass index should be between 23-30. Your Body mass index is 29.27 kg/m. If this is out of the aforementioned range listed, please consider follow up with your Primary Care Provider.  If you are age 49 or younger, your body mass index should be between 19-25. Your Body mass index is 29.27 kg/m. If this is out of the aformentioned range listed, please consider follow up with your Primary Care Provider.

## 2018-03-14 ENCOUNTER — Encounter: Payer: Self-pay | Admitting: Internal Medicine

## 2018-03-14 NOTE — Progress Notes (Signed)
OFFICE NOTE  Chief Complaint:  No complaints  Primary Care Physician: Etta Grandchild, MD  HPI:  Deborah Chung is a pleasant 85 year old female who recently moved to [place] from [place]. She was apparently living in [place], somewhere outside of [place]. Her son is Lyriq Flamand, who is the in-house counsel for the [employer] system. She was previously seeing [facility] and had an episode in February of 2015. Apparently she had a mobile health screening and was found to be out of rhythm. She was sent to her primary care doctor who did an EKG and noted she was in A. fib and was sent immediately to the hospital. As the story goes, she was on several treatments for her A. fib and apparently eventually underwent cardiac catheterization and/or possibly an ablation procedure for which she developed asystole or some type of arrhythmia requiring her to be resuscitated. Obviously this was successful and she received apparently a pacemaker. She is not clear as to what the type of pacemaker wasn't did not provide documentation today. I've not yet received any records from her cardiologist in [place]. She was placed on Eliquis for anticoagulation. In addition, it appears she is on digoxin and Aldactone along with Lopressor which is suggestive of possible cardiomyopathy. She apparently does have a heart failure diagnosis however I'm not certain as to what her ejection fraction is. She reports a remote check device for her pacemaker and did a remote check apparently last week which went to [place]. Symptomatically she denies any chest pain or shortness of breath.  I saw Deborah Chung back today in the office. She is doing fairly well. She saw Dr. Lewayne Bunting in November who performed a pacemaker check and felt that she was doing well in sinus rhythm. There is no evidence for recurrent atrial fibrillation. She has yet to be enrolled in home monitoring. Twice she has  requested a home monitor but it has not been yet sent to her. I will need to look into why she has not received her home monitoring device. 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 Eyes a chest pain or worsening shortness of breath. As mentioned recent laboratory work from her primary shows excellent cholesterol control with total cholesterol 174, triglycerides 124, HDL 59 and LDL of 90.  At the pleasure seeing Deborah Chung back in the office today. She reports doing fairly well. She still has some issues with swelling in her legs and discomfort with achiness and heaviness. She's had bilateral knee surgeries and is noted to have some varicose veins as well as some discoloration over her ankles. From a cardiac standpoint she had an echo which shows preserved systolic function, this represents a marked improvement since her EF was reported around 10% at one point but this may been after cardiac arrest when she got her pacemaker placed. Pacemaker function appears to be normal. She's had little if any atrial fibrillation and is on Eliquis for a CHADSVASC score of 4.  I saw Deborah Chung back today in the office. Overall she is feeling well. She denies any worsening shortness of breath or swelling. EF is now normalized. She follows with Dr. Ladona Ridgel for pacemaker. She's had no bleeding problems on Eliquis. She is on low-dose digoxin which will require monitoring. She is overdue for cholesterol check.  08/21/2015  Deborah Chung returns today for follow-up. In the interim she had a pacer remote check with a stable burden of atrial fibrillation. She continues  to do well on Eliquis. We discontinued her digoxin and her last office visit for a borderline elevated digoxin level and the fact that it is really not indicated for A. fib and her congestive heart failure has improved with normal LV function. Blood pressure is well-controlled today. Her only other complaint is that she's had diarrhea for approximately 7 days.  There is no fever or chills associated with that. He came on abruptly. She denied any sick contacts. She is using Imodium for this.  03/05/2016  Deborah Chung returns today for follow-up. Overall she feels well. She had recent adjustment in her pacemaker when she saw Dr. Ladona Ridgel and is atrial paced today is 70. Blood pressure is normal 118/62. She denies any bleeding problems on L a course. She is not aware of her A. fib. Recently she had a bout with diarrhea was found to have a pancreatic insufficiency. She is on Creon.  09/03/2078  Deborah Chung was seen today in follow-up. Her main complaint is bilateral knee pain. She has a history of knee replacement the past but her surgeon now lives in [place]. She's hesitant to do anything different about it. She's not as active as she's been in the past. She continues to have some problems with pancreatic insufficiency and pain associated with that. She denies any chest pain or worsening shortness of breath. Her pacemaker remote checks of been stable. The last was in May which showed a very low burden of A. fib of only 2%.  09/10/2017  Deborah Chung is seen today in follow-up.  Overall she has no significant complaints.  She has had a well-functioning pacemaker which has been followed by Dr. Ladona Ridgel.  Remote pacer checks have shown normal findings.  She still struggles with some GI issues and is followed by Dr. Christella Hartigan.  Blood pressure is well controlled today 110/60.  EKG shows an atrial paced rhythm.  She denies any symptomatic atrial fibrillation.  She has had no bleeding problems on Eliquis and denies any frequent falls or head injuries.  03/14/2018  Deborah Chung is seen today for follow-up.  Recently she has had remote pacer checks which continues showed normal findings.  Her blood pressure is well controlled today.  Her next pacer check is in May.  She had recent labs which showed an LDL of 80, which is reasonable control.  With regards to AF she is on Eliquis 2.5 mg  twice daily.  She is also on low-dose aspirin.  We discussed this a little further and I feel that there is no additional indication for that.  She takes pravastatin 20 mg daily.  PMHx:  Past Medical History:  Diagnosis Date  . Anxiety   . Atrial fibrillation (HCC)   . Blood transfusion without reported diagnosis   . GAD (generalized anxiety disorder)   . Hearing loss   . Hyperlipidemia   . Hypertension   . PHN (postherpetic neuralgia) 2010  . Systolic CHF [facility])     Past Surgical History:  Procedure Laterality Date  . ABDOMINAL HYSTERECTOMY    . PACEMAKER PLACEMENT    . TOTAL KNEE ARTHROPLASTY Bilateral     FAMHx:  Family History  Problem Relation Age of Onset  . Heart disease Father   . Heart disease Sister     SOCHx:   reports that she quit smoking about 29 years ago. She quit after 25.00 years of use. She has never used smokeless tobacco. She reports previous alcohol use. She reports that she does  not use drugs.  ALLERGIES:  No Known Allergies  ROS: Pertinent items noted in HPI and remainder of comprehensive ROS otherwise negative.  HOME MEDS: Current Outpatient Medications  Medication Sig Dispense Refill  . ALPRAZolam (XANAX) 0.25 MG tablet TAKE 1 TABLET BY MOUTH AS NEEDED FOR ANXIETY 30 tablet 5  . aspirin 81 MG tablet Take 81 mg by mouth daily.    Marland Kitchen BIOTIN PO Take by mouth daily.    . diphenoxylate-atropine (LOMOTIL) 2.5-0.025 MG tablet TAKE 1 TABLET BY MOUTH TWICE A DAY 60 tablet 1  . DULoxetine (CYMBALTA) 60 MG capsule Take 1 capsule (60 mg total) by mouth daily. 90 capsule 1  . ELIQUIS 2.5 MG TABS tablet TAKE 1 TABLET BY MOUTH TWICE A DAY 180 tablet 1  . LYRICA 75 MG capsule TAKE 1 CAPSULE BY MOUTH THREE TIMES A DAY 270 capsule 1  . metoprolol tartrate (LOPRESSOR) 25 MG tablet TAKE 1 TABLET BY MOUTH TWICE A DAY 180 tablet 1  . Omega-3 Fatty Acids (OMEGA-3 FISH OIL PO) Take by mouth.    . pravastatin (PRAVACHOL) 20 MG tablet Take 1 tablet (20 mg total) by  mouth daily. 90 tablet 1  . Probiotic Product (PROBIOTIC PO) Take 1 tablet by mouth daily.    Marland Kitchen spironolactone (ALDACTONE) 25 MG tablet TAKE 1 TABLET BY MOUTH EVERY DAY 90 tablet 1  . traZODone (DESYREL) 50 MG tablet Take 2 tablets (100 mg total) by mouth at bedtime as needed for sleep. 180 tablet 1  . famotidine (PEPCID) 20 MG tablet Take 1 tablet (20 mg total) by mouth daily. Discontinue dexilant 90 tablet 1  . lipase/protease/amylase (CREON) 36000 UNITS CPEP capsule Take 2 capsules with each meal and 1 capsule with each snack 720 capsule 1   No current facility-administered medications for this visit.     LABS/IMAGING: No results found for this or any previous visit (from the past 48 hour(s)). No results found.  VITALS: BP 111/63   Pulse 70   Ht 5\' 4"  (1.626 m)   Wt 177 lb 6.4 oz (80.5 kg)   SpO2 94%   BMI 30.45 kg/m   EXAM: General appearance: alert and no distress Neck: no carotid bruit and no JVD Lungs: clear to auscultation bilaterally and pacemaker in well-healed pocket in the left upper chest Heart: regular rate and rhythm, S1, S2 normal, no murmur, click, rub or gallop Abdomen: soft, non-tender; bowel sounds normal; no masses,  no organomegaly Extremities: extremities normal, atraumatic, no cyanosis or edema, varicose veins noted and Shiny skin without pitting edema Pulses: 2+ and symmetric Skin: Skin color, texture, turgor normal. No rashes or lesions Neurologic: Grossly normal Psych: NOrmal  EKG: Atrial paced rhythm at 70-personally reviewed  ASSESSMENT: 1. History of atrial fibrillation on anticoagulation with Eliquis (CHADVASC 4) 2. Hypertension 3. Dyslipidemia 4. Status post pacemaker - for sinus node dysfunction and complete heart block 5. Reported history of EF of 10-15%, now improved to 55-60%  PLAN: 1.  Deborah Chung has a low burden of AF about 3%.  At times she has some RVR.  She is on Eliquis.  Blood pressure is well controlled.  She is just above  target LDL less than 70 on her current statin dose.  We talked about dietary options to improve her cholesterol further.  She does have history of cardiomyopathy however her last EF was 55 to 60%.  I do not see an additional reason for aspirin at this point.  We will continue with Eliquis  anticoagulation.  Plan follow-up with me annually or sooner as necessary.  Chrystie Nose, MD, [facility], FACP  [place]  [facility]  Medical Director of the Advanced Lipid Disorders &  Cardiovascular Risk Reduction Clinic Diplomate of the American Board of Clinical Lipidology Attending Cardiologist  Direct Dial: [phone]  Fax: [phone]  Website:  [email or web address] 03/14/2018, 7:19 PM

## 2018-03-16 ENCOUNTER — Telehealth: Payer: Self-pay | Admitting: Physician Assistant

## 2018-03-16 NOTE — Telephone Encounter (Signed)
Pt daughter-in-law called advised that the med CREON does not seem to be helping. The patient was seen on 2-28 and is still having constant diarrhea.  Daughter-in-law Thurston Hole [phone]

## 2018-03-16 NOTE — Progress Notes (Signed)
I agree with the above note, paln

## 2018-03-17 NOTE — Telephone Encounter (Signed)
The pt's daughter in law was advised to have the pt return stool studies so that we will have a better idea of what is going on and how to treat.  She will have her complete the test as soon as she can.

## 2018-03-19 ENCOUNTER — Other Ambulatory Visit: Payer: Medicare PPO

## 2018-03-19 DIAGNOSIS — R197 Diarrhea, unspecified: Secondary | ICD-10-CM

## 2018-03-20 ENCOUNTER — Other Ambulatory Visit: Payer: Self-pay | Admitting: Gastroenterology

## 2018-03-20 MED ORDER — DIPHENOXYLATE-ATROPINE 2.5-0.025 MG PO TABS: 1.0000 | ORAL_TABLET | Freq: Two times a day (BID) | ORAL | 1 refills | Status: DC

## 2018-03-20 NOTE — Telephone Encounter (Signed)
Pt stated that she tried to pick up Lomotil at pharmacy but it was denied.  Pt doesn't know why "they won't let me pick up my medication."

## 2018-03-20 NOTE — Telephone Encounter (Signed)
Informed patient that according to Carmel Sacramento, PA last note patient needs to continue Lomotil so I faxed a refill to her pharmacy. Patient verbalized understanding.

## 2018-03-25 LAB — GASTROINTESTINAL PATHOGEN PANEL PCR
C. difficile Tox A/B, PCR: NOT DETECTED
Campylobacter, PCR: NOT DETECTED
Cryptosporidium, PCR: NOT DETECTED
E coli (ETEC) LT/ST PCR: NOT DETECTED
E coli (STEC) stx1/stx2, PCR: NOT DETECTED
E coli 0157, PCR: NOT DETECTED
Giardia lamblia, PCR: NOT DETECTED
Norovirus, PCR: NOT DETECTED
Rotavirus A, PCR: NOT DETECTED
Salmonella, PCR: NOT DETECTED
Shigella, PCR: NOT DETECTED

## 2018-03-25 LAB — OVA AND PARASITE EXAMINATION
CONCENTRATE RESULT:: NONE SEEN
MICRO NUMBER:: 281442
SPECIMEN QUALITY:: ADEQUATE
TRICHROME RESULT:: NONE SEEN

## 2018-03-27 ENCOUNTER — Other Ambulatory Visit: Payer: Self-pay | Admitting: Internal Medicine

## 2018-03-27 DIAGNOSIS — E785 Hyperlipidemia, unspecified: Secondary | ICD-10-CM

## 2018-05-02 ENCOUNTER — Other Ambulatory Visit: Payer: Self-pay | Admitting: Internal Medicine

## 2018-05-02 DIAGNOSIS — F418 Other specified anxiety disorders: Secondary | ICD-10-CM

## 2018-05-02 DIAGNOSIS — F5104 Psychophysiologic insomnia: Secondary | ICD-10-CM

## 2018-05-20 DIAGNOSIS — H04123 Dry eye syndrome of bilateral lacrimal glands: Secondary | ICD-10-CM | POA: Diagnosis not present

## 2018-05-20 DIAGNOSIS — H52203 Unspecified astigmatism, bilateral: Secondary | ICD-10-CM | POA: Diagnosis not present

## 2018-05-20 DIAGNOSIS — H02102 Unspecified ectropion of right lower eyelid: Secondary | ICD-10-CM | POA: Diagnosis not present

## 2018-05-20 DIAGNOSIS — H26493 Other secondary cataract, bilateral: Secondary | ICD-10-CM | POA: Diagnosis not present

## 2018-05-25 ENCOUNTER — Other Ambulatory Visit: Payer: Self-pay | Admitting: Gastroenterology

## 2018-05-25 ENCOUNTER — Telehealth: Payer: Self-pay | Admitting: Gastroenterology

## 2018-05-25 MED ORDER — DIPHENOXYLATE-ATROPINE 2.5-0.025 MG PO TABS: 1.0000 | ORAL_TABLET | Freq: Four times a day (QID) | ORAL | 1 refills | Status: DC | PRN

## 2018-05-25 NOTE — Telephone Encounter (Signed)
Spoke to patient son [name] to clarify patients pharmacy. Chart has been updated and prescription sent. Patient notified.

## 2018-05-25 NOTE — Telephone Encounter (Signed)
Pt's daughter called and requested Lomotil prescription to be sent to [facility] on [address].  Daughter stated that pt is no longer able to drive--pharmacy preference needs to be updated.

## 2018-05-27 ENCOUNTER — Other Ambulatory Visit: Payer: Self-pay

## 2018-05-27 ENCOUNTER — Ambulatory Visit (INDEPENDENT_AMBULATORY_CARE_PROVIDER_SITE_OTHER): Payer: Medicare PPO | Admitting: *Deleted

## 2018-05-27 DIAGNOSIS — I480 Paroxysmal atrial fibrillation: Secondary | ICD-10-CM

## 2018-05-27 DIAGNOSIS — I495 Sick sinus syndrome: Secondary | ICD-10-CM

## 2018-05-28 DIAGNOSIS — R296 Repeated falls: Secondary | ICD-10-CM | POA: Diagnosis not present

## 2018-05-28 DIAGNOSIS — R2681 Unsteadiness on feet: Secondary | ICD-10-CM | POA: Diagnosis not present

## 2018-05-28 DIAGNOSIS — R262 Difficulty in walking, not elsewhere classified: Secondary | ICD-10-CM | POA: Diagnosis not present

## 2018-05-28 DIAGNOSIS — M6281 Muscle weakness (generalized): Secondary | ICD-10-CM | POA: Diagnosis not present

## 2018-05-28 LAB — CUP PACEART REMOTE DEVICE CHECK
Battery Remaining Longevity: 42 mo
Battery Remaining Percentage: 63 %
Brady Statistic RA Percent Paced: 83 %
Brady Statistic RV Percent Paced: 2 %
Date Time Interrogation Session: 20200513071100
Implantable Lead Implant Date: 20150112
Implantable Lead Implant Date: 20150112
Implantable Lead Location: 753859
Implantable Lead Location: 753860
Implantable Lead Model: 4135
Implantable Lead Model: 4136
Implantable Lead Serial Number: 29308444
Implantable Lead Serial Number: 29569556
Implantable Pulse Generator Implant Date: 20150112
Lead Channel Impedance Value: 521 Ohm
Lead Channel Impedance Value: 799 Ohm
Lead Channel Pacing Threshold Amplitude: 0.7 V
Lead Channel Pacing Threshold Pulse Width: 0.4 ms
Lead Channel Setting Pacing Amplitude: 1.2 V
Lead Channel Setting Pacing Amplitude: 2.4 V
Lead Channel Setting Pacing Pulse Width: 0.4 ms
Lead Channel Setting Sensing Sensitivity: 3 mV
Pulse Gen Serial Number: 391753

## 2018-06-02 DIAGNOSIS — R296 Repeated falls: Secondary | ICD-10-CM | POA: Diagnosis not present

## 2018-06-02 DIAGNOSIS — M6281 Muscle weakness (generalized): Secondary | ICD-10-CM | POA: Diagnosis not present

## 2018-06-02 DIAGNOSIS — R262 Difficulty in walking, not elsewhere classified: Secondary | ICD-10-CM | POA: Diagnosis not present

## 2018-06-02 DIAGNOSIS — R2681 Unsteadiness on feet: Secondary | ICD-10-CM | POA: Diagnosis not present

## 2018-06-03 DIAGNOSIS — R296 Repeated falls: Secondary | ICD-10-CM | POA: Diagnosis not present

## 2018-06-03 DIAGNOSIS — R2681 Unsteadiness on feet: Secondary | ICD-10-CM | POA: Diagnosis not present

## 2018-06-03 DIAGNOSIS — M6281 Muscle weakness (generalized): Secondary | ICD-10-CM | POA: Diagnosis not present

## 2018-06-03 DIAGNOSIS — R262 Difficulty in walking, not elsewhere classified: Secondary | ICD-10-CM | POA: Diagnosis not present

## 2018-06-05 ENCOUNTER — Other Ambulatory Visit: Payer: Self-pay | Admitting: Internal Medicine

## 2018-06-05 DIAGNOSIS — R2681 Unsteadiness on feet: Secondary | ICD-10-CM | POA: Diagnosis not present

## 2018-06-05 DIAGNOSIS — R296 Repeated falls: Secondary | ICD-10-CM | POA: Diagnosis not present

## 2018-06-05 DIAGNOSIS — R262 Difficulty in walking, not elsewhere classified: Secondary | ICD-10-CM | POA: Diagnosis not present

## 2018-06-05 DIAGNOSIS — M6281 Muscle weakness (generalized): Secondary | ICD-10-CM | POA: Diagnosis not present

## 2018-06-10 DIAGNOSIS — R296 Repeated falls: Secondary | ICD-10-CM | POA: Diagnosis not present

## 2018-06-10 DIAGNOSIS — M6281 Muscle weakness (generalized): Secondary | ICD-10-CM | POA: Diagnosis not present

## 2018-06-10 DIAGNOSIS — R262 Difficulty in walking, not elsewhere classified: Secondary | ICD-10-CM | POA: Diagnosis not present

## 2018-06-10 DIAGNOSIS — R2681 Unsteadiness on feet: Secondary | ICD-10-CM | POA: Diagnosis not present

## 2018-06-11 DIAGNOSIS — R262 Difficulty in walking, not elsewhere classified: Secondary | ICD-10-CM | POA: Diagnosis not present

## 2018-06-11 DIAGNOSIS — M6281 Muscle weakness (generalized): Secondary | ICD-10-CM | POA: Diagnosis not present

## 2018-06-11 DIAGNOSIS — R296 Repeated falls: Secondary | ICD-10-CM | POA: Diagnosis not present

## 2018-06-11 DIAGNOSIS — R2681 Unsteadiness on feet: Secondary | ICD-10-CM | POA: Diagnosis not present

## 2018-06-12 ENCOUNTER — Other Ambulatory Visit: Payer: Self-pay | Admitting: Internal Medicine

## 2018-06-12 DIAGNOSIS — I10 Essential (primary) hypertension: Secondary | ICD-10-CM

## 2018-06-12 DIAGNOSIS — R262 Difficulty in walking, not elsewhere classified: Secondary | ICD-10-CM | POA: Diagnosis not present

## 2018-06-12 DIAGNOSIS — M6281 Muscle weakness (generalized): Secondary | ICD-10-CM | POA: Diagnosis not present

## 2018-06-12 DIAGNOSIS — R296 Repeated falls: Secondary | ICD-10-CM | POA: Diagnosis not present

## 2018-06-12 DIAGNOSIS — R2681 Unsteadiness on feet: Secondary | ICD-10-CM | POA: Diagnosis not present

## 2018-06-12 NOTE — Progress Notes (Signed)
Remote pacemaker transmission.   

## 2018-06-15 ENCOUNTER — Telehealth: Payer: Self-pay | Admitting: Internal Medicine

## 2018-06-15 NOTE — Telephone Encounter (Signed)
Copied from CRM [id number]. Topic: Quick Communication - Rx Refill/Question >> Jun 15, 2018  2:50 PM Floria Raveling A wrote: Medication: LYRICA 75 MG capsule [[id number] Has the patient contacted their pharmacy? No.- this script is going to a new location  (Agent: If no, request that the patient contact the pharmacy for the refill.) (Agent: If yes, when and what did the pharmacy advise?)  Preferred Pharmacy (with phone number or street name): [facility] - [place], [place] - [address] [phone] (Phone)   Agent: Please be advised that RX refills may take up to 3 business days. We ask that you follow-up with your pharmacy.

## 2018-06-16 ENCOUNTER — Other Ambulatory Visit: Payer: Self-pay | Admitting: Internal Medicine

## 2018-06-16 DIAGNOSIS — G6181 Chronic inflammatory demyelinating polyneuritis: Secondary | ICD-10-CM

## 2018-06-16 DIAGNOSIS — R2681 Unsteadiness on feet: Secondary | ICD-10-CM | POA: Diagnosis not present

## 2018-06-16 DIAGNOSIS — R262 Difficulty in walking, not elsewhere classified: Secondary | ICD-10-CM | POA: Diagnosis not present

## 2018-06-16 DIAGNOSIS — B0229 Other postherpetic nervous system involvement: Secondary | ICD-10-CM

## 2018-06-16 DIAGNOSIS — R296 Repeated falls: Secondary | ICD-10-CM | POA: Diagnosis not present

## 2018-06-16 DIAGNOSIS — M6281 Muscle weakness (generalized): Secondary | ICD-10-CM | POA: Diagnosis not present

## 2018-06-16 DIAGNOSIS — R278 Other lack of coordination: Secondary | ICD-10-CM | POA: Diagnosis not present

## 2018-06-16 MED ORDER — PREGABALIN 75 MG PO CAPS: ORAL_CAPSULE | ORAL | 1 refills | Status: DC

## 2018-06-16 NOTE — Telephone Encounter (Signed)
I called pt- she states she takes Lyrica for postherpetic neuralgia.

## 2018-06-16 NOTE — Telephone Encounter (Signed)
Does she take this up for postherpetic neuralgia or peripheral neuropathy?

## 2018-06-17 ENCOUNTER — Other Ambulatory Visit: Payer: Self-pay | Admitting: Internal Medicine

## 2018-06-17 DIAGNOSIS — R278 Other lack of coordination: Secondary | ICD-10-CM | POA: Diagnosis not present

## 2018-06-17 DIAGNOSIS — R2681 Unsteadiness on feet: Secondary | ICD-10-CM | POA: Diagnosis not present

## 2018-06-17 DIAGNOSIS — R262 Difficulty in walking, not elsewhere classified: Secondary | ICD-10-CM | POA: Diagnosis not present

## 2018-06-17 DIAGNOSIS — R296 Repeated falls: Secondary | ICD-10-CM | POA: Diagnosis not present

## 2018-06-17 DIAGNOSIS — M6281 Muscle weakness (generalized): Secondary | ICD-10-CM | POA: Diagnosis not present

## 2018-06-17 DIAGNOSIS — B0229 Other postherpetic nervous system involvement: Secondary | ICD-10-CM

## 2018-06-18 DIAGNOSIS — R296 Repeated falls: Secondary | ICD-10-CM | POA: Diagnosis not present

## 2018-06-18 DIAGNOSIS — R2681 Unsteadiness on feet: Secondary | ICD-10-CM | POA: Diagnosis not present

## 2018-06-18 DIAGNOSIS — R278 Other lack of coordination: Secondary | ICD-10-CM | POA: Diagnosis not present

## 2018-06-18 DIAGNOSIS — R262 Difficulty in walking, not elsewhere classified: Secondary | ICD-10-CM | POA: Diagnosis not present

## 2018-06-18 DIAGNOSIS — M6281 Muscle weakness (generalized): Secondary | ICD-10-CM | POA: Diagnosis not present

## 2018-06-19 DIAGNOSIS — M6281 Muscle weakness (generalized): Secondary | ICD-10-CM | POA: Diagnosis not present

## 2018-06-19 DIAGNOSIS — R262 Difficulty in walking, not elsewhere classified: Secondary | ICD-10-CM | POA: Diagnosis not present

## 2018-06-19 DIAGNOSIS — R278 Other lack of coordination: Secondary | ICD-10-CM | POA: Diagnosis not present

## 2018-06-19 DIAGNOSIS — R2681 Unsteadiness on feet: Secondary | ICD-10-CM | POA: Diagnosis not present

## 2018-06-19 DIAGNOSIS — R296 Repeated falls: Secondary | ICD-10-CM | POA: Diagnosis not present

## 2018-06-22 DIAGNOSIS — R2681 Unsteadiness on feet: Secondary | ICD-10-CM | POA: Diagnosis not present

## 2018-06-22 DIAGNOSIS — R262 Difficulty in walking, not elsewhere classified: Secondary | ICD-10-CM | POA: Diagnosis not present

## 2018-06-22 DIAGNOSIS — R278 Other lack of coordination: Secondary | ICD-10-CM | POA: Diagnosis not present

## 2018-06-22 DIAGNOSIS — R296 Repeated falls: Secondary | ICD-10-CM | POA: Diagnosis not present

## 2018-06-22 DIAGNOSIS — M6281 Muscle weakness (generalized): Secondary | ICD-10-CM | POA: Diagnosis not present

## 2018-06-23 DIAGNOSIS — R296 Repeated falls: Secondary | ICD-10-CM | POA: Diagnosis not present

## 2018-06-23 DIAGNOSIS — R262 Difficulty in walking, not elsewhere classified: Secondary | ICD-10-CM | POA: Diagnosis not present

## 2018-06-23 DIAGNOSIS — R278 Other lack of coordination: Secondary | ICD-10-CM | POA: Diagnosis not present

## 2018-06-23 DIAGNOSIS — R2681 Unsteadiness on feet: Secondary | ICD-10-CM | POA: Diagnosis not present

## 2018-06-23 DIAGNOSIS — M6281 Muscle weakness (generalized): Secondary | ICD-10-CM | POA: Diagnosis not present

## 2018-06-24 DIAGNOSIS — R262 Difficulty in walking, not elsewhere classified: Secondary | ICD-10-CM | POA: Diagnosis not present

## 2018-06-24 DIAGNOSIS — M6281 Muscle weakness (generalized): Secondary | ICD-10-CM | POA: Diagnosis not present

## 2018-06-24 DIAGNOSIS — R278 Other lack of coordination: Secondary | ICD-10-CM | POA: Diagnosis not present

## 2018-06-24 DIAGNOSIS — R296 Repeated falls: Secondary | ICD-10-CM | POA: Diagnosis not present

## 2018-06-24 DIAGNOSIS — R2681 Unsteadiness on feet: Secondary | ICD-10-CM | POA: Diagnosis not present

## 2018-06-25 DIAGNOSIS — R278 Other lack of coordination: Secondary | ICD-10-CM | POA: Diagnosis not present

## 2018-06-25 DIAGNOSIS — R296 Repeated falls: Secondary | ICD-10-CM | POA: Diagnosis not present

## 2018-06-25 DIAGNOSIS — R2681 Unsteadiness on feet: Secondary | ICD-10-CM | POA: Diagnosis not present

## 2018-06-25 DIAGNOSIS — M6281 Muscle weakness (generalized): Secondary | ICD-10-CM | POA: Diagnosis not present

## 2018-06-25 DIAGNOSIS — R262 Difficulty in walking, not elsewhere classified: Secondary | ICD-10-CM | POA: Diagnosis not present

## 2018-06-26 DIAGNOSIS — R2681 Unsteadiness on feet: Secondary | ICD-10-CM | POA: Diagnosis not present

## 2018-06-26 DIAGNOSIS — R296 Repeated falls: Secondary | ICD-10-CM | POA: Diagnosis not present

## 2018-06-26 DIAGNOSIS — M6281 Muscle weakness (generalized): Secondary | ICD-10-CM | POA: Diagnosis not present

## 2018-06-26 DIAGNOSIS — R262 Difficulty in walking, not elsewhere classified: Secondary | ICD-10-CM | POA: Diagnosis not present

## 2018-06-26 DIAGNOSIS — R278 Other lack of coordination: Secondary | ICD-10-CM | POA: Diagnosis not present

## 2018-06-29 DIAGNOSIS — R296 Repeated falls: Secondary | ICD-10-CM | POA: Diagnosis not present

## 2018-06-29 DIAGNOSIS — R262 Difficulty in walking, not elsewhere classified: Secondary | ICD-10-CM | POA: Diagnosis not present

## 2018-06-29 DIAGNOSIS — R2681 Unsteadiness on feet: Secondary | ICD-10-CM | POA: Diagnosis not present

## 2018-06-29 DIAGNOSIS — M6281 Muscle weakness (generalized): Secondary | ICD-10-CM | POA: Diagnosis not present

## 2018-06-29 DIAGNOSIS — R278 Other lack of coordination: Secondary | ICD-10-CM | POA: Diagnosis not present

## 2018-06-30 DIAGNOSIS — M6281 Muscle weakness (generalized): Secondary | ICD-10-CM | POA: Diagnosis not present

## 2018-06-30 DIAGNOSIS — R2681 Unsteadiness on feet: Secondary | ICD-10-CM | POA: Diagnosis not present

## 2018-06-30 DIAGNOSIS — R296 Repeated falls: Secondary | ICD-10-CM | POA: Diagnosis not present

## 2018-06-30 DIAGNOSIS — R262 Difficulty in walking, not elsewhere classified: Secondary | ICD-10-CM | POA: Diagnosis not present

## 2018-06-30 DIAGNOSIS — R278 Other lack of coordination: Secondary | ICD-10-CM | POA: Diagnosis not present

## 2018-07-01 DIAGNOSIS — R278 Other lack of coordination: Secondary | ICD-10-CM | POA: Diagnosis not present

## 2018-07-01 DIAGNOSIS — R262 Difficulty in walking, not elsewhere classified: Secondary | ICD-10-CM | POA: Diagnosis not present

## 2018-07-01 DIAGNOSIS — R296 Repeated falls: Secondary | ICD-10-CM | POA: Diagnosis not present

## 2018-07-01 DIAGNOSIS — R2681 Unsteadiness on feet: Secondary | ICD-10-CM | POA: Diagnosis not present

## 2018-07-01 DIAGNOSIS — M6281 Muscle weakness (generalized): Secondary | ICD-10-CM | POA: Diagnosis not present

## 2018-07-02 DIAGNOSIS — R278 Other lack of coordination: Secondary | ICD-10-CM | POA: Diagnosis not present

## 2018-07-02 DIAGNOSIS — M6281 Muscle weakness (generalized): Secondary | ICD-10-CM | POA: Diagnosis not present

## 2018-07-02 DIAGNOSIS — R296 Repeated falls: Secondary | ICD-10-CM | POA: Diagnosis not present

## 2018-07-02 DIAGNOSIS — R262 Difficulty in walking, not elsewhere classified: Secondary | ICD-10-CM | POA: Diagnosis not present

## 2018-07-02 DIAGNOSIS — R2681 Unsteadiness on feet: Secondary | ICD-10-CM | POA: Diagnosis not present

## 2018-07-03 DIAGNOSIS — R262 Difficulty in walking, not elsewhere classified: Secondary | ICD-10-CM | POA: Diagnosis not present

## 2018-07-03 DIAGNOSIS — R2681 Unsteadiness on feet: Secondary | ICD-10-CM | POA: Diagnosis not present

## 2018-07-03 DIAGNOSIS — R296 Repeated falls: Secondary | ICD-10-CM | POA: Diagnosis not present

## 2018-07-03 DIAGNOSIS — M6281 Muscle weakness (generalized): Secondary | ICD-10-CM | POA: Diagnosis not present

## 2018-07-03 DIAGNOSIS — R278 Other lack of coordination: Secondary | ICD-10-CM | POA: Diagnosis not present

## 2018-07-06 DIAGNOSIS — R2681 Unsteadiness on feet: Secondary | ICD-10-CM | POA: Diagnosis not present

## 2018-07-06 DIAGNOSIS — R278 Other lack of coordination: Secondary | ICD-10-CM | POA: Diagnosis not present

## 2018-07-06 DIAGNOSIS — R296 Repeated falls: Secondary | ICD-10-CM | POA: Diagnosis not present

## 2018-07-06 DIAGNOSIS — M6281 Muscle weakness (generalized): Secondary | ICD-10-CM | POA: Diagnosis not present

## 2018-07-06 DIAGNOSIS — R262 Difficulty in walking, not elsewhere classified: Secondary | ICD-10-CM | POA: Diagnosis not present

## 2018-07-07 DIAGNOSIS — R262 Difficulty in walking, not elsewhere classified: Secondary | ICD-10-CM | POA: Diagnosis not present

## 2018-07-07 DIAGNOSIS — R296 Repeated falls: Secondary | ICD-10-CM | POA: Diagnosis not present

## 2018-07-07 DIAGNOSIS — R2681 Unsteadiness on feet: Secondary | ICD-10-CM | POA: Diagnosis not present

## 2018-07-07 DIAGNOSIS — R278 Other lack of coordination: Secondary | ICD-10-CM | POA: Diagnosis not present

## 2018-07-07 DIAGNOSIS — M6281 Muscle weakness (generalized): Secondary | ICD-10-CM | POA: Diagnosis not present

## 2018-07-08 DIAGNOSIS — R2681 Unsteadiness on feet: Secondary | ICD-10-CM | POA: Diagnosis not present

## 2018-07-08 DIAGNOSIS — R262 Difficulty in walking, not elsewhere classified: Secondary | ICD-10-CM | POA: Diagnosis not present

## 2018-07-08 DIAGNOSIS — M6281 Muscle weakness (generalized): Secondary | ICD-10-CM | POA: Diagnosis not present

## 2018-07-08 DIAGNOSIS — R278 Other lack of coordination: Secondary | ICD-10-CM | POA: Diagnosis not present

## 2018-07-08 DIAGNOSIS — R296 Repeated falls: Secondary | ICD-10-CM | POA: Diagnosis not present

## 2018-07-09 DIAGNOSIS — R278 Other lack of coordination: Secondary | ICD-10-CM | POA: Diagnosis not present

## 2018-07-09 DIAGNOSIS — R262 Difficulty in walking, not elsewhere classified: Secondary | ICD-10-CM | POA: Diagnosis not present

## 2018-07-09 DIAGNOSIS — R296 Repeated falls: Secondary | ICD-10-CM | POA: Diagnosis not present

## 2018-07-09 DIAGNOSIS — M6281 Muscle weakness (generalized): Secondary | ICD-10-CM | POA: Diagnosis not present

## 2018-07-09 DIAGNOSIS — R2681 Unsteadiness on feet: Secondary | ICD-10-CM | POA: Diagnosis not present

## 2018-07-10 DIAGNOSIS — M6281 Muscle weakness (generalized): Secondary | ICD-10-CM | POA: Diagnosis not present

## 2018-07-10 DIAGNOSIS — R278 Other lack of coordination: Secondary | ICD-10-CM | POA: Diagnosis not present

## 2018-07-10 DIAGNOSIS — R262 Difficulty in walking, not elsewhere classified: Secondary | ICD-10-CM | POA: Diagnosis not present

## 2018-07-10 DIAGNOSIS — R296 Repeated falls: Secondary | ICD-10-CM | POA: Diagnosis not present

## 2018-07-10 DIAGNOSIS — R2681 Unsteadiness on feet: Secondary | ICD-10-CM | POA: Diagnosis not present

## 2018-07-15 DIAGNOSIS — R1312 Dysphagia, oropharyngeal phase: Secondary | ICD-10-CM | POA: Diagnosis not present

## 2018-07-15 DIAGNOSIS — M6281 Muscle weakness (generalized): Secondary | ICD-10-CM | POA: Diagnosis not present

## 2018-07-15 DIAGNOSIS — R296 Repeated falls: Secondary | ICD-10-CM | POA: Diagnosis not present

## 2018-07-15 DIAGNOSIS — R278 Other lack of coordination: Secondary | ICD-10-CM | POA: Diagnosis not present

## 2018-07-15 DIAGNOSIS — R4701 Aphasia: Secondary | ICD-10-CM | POA: Diagnosis not present

## 2018-07-15 DIAGNOSIS — R262 Difficulty in walking, not elsewhere classified: Secondary | ICD-10-CM | POA: Diagnosis not present

## 2018-07-15 DIAGNOSIS — R41841 Cognitive communication deficit: Secondary | ICD-10-CM | POA: Diagnosis not present

## 2018-07-15 DIAGNOSIS — R2681 Unsteadiness on feet: Secondary | ICD-10-CM | POA: Diagnosis not present

## 2018-07-16 DIAGNOSIS — R2681 Unsteadiness on feet: Secondary | ICD-10-CM | POA: Diagnosis not present

## 2018-07-16 DIAGNOSIS — R296 Repeated falls: Secondary | ICD-10-CM | POA: Diagnosis not present

## 2018-07-16 DIAGNOSIS — M6281 Muscle weakness (generalized): Secondary | ICD-10-CM | POA: Diagnosis not present

## 2018-07-16 DIAGNOSIS — R1312 Dysphagia, oropharyngeal phase: Secondary | ICD-10-CM | POA: Diagnosis not present

## 2018-07-16 DIAGNOSIS — R4701 Aphasia: Secondary | ICD-10-CM | POA: Diagnosis not present

## 2018-07-16 DIAGNOSIS — R278 Other lack of coordination: Secondary | ICD-10-CM | POA: Diagnosis not present

## 2018-07-16 DIAGNOSIS — R41841 Cognitive communication deficit: Secondary | ICD-10-CM | POA: Diagnosis not present

## 2018-07-16 DIAGNOSIS — R262 Difficulty in walking, not elsewhere classified: Secondary | ICD-10-CM | POA: Diagnosis not present

## 2018-07-17 DIAGNOSIS — R296 Repeated falls: Secondary | ICD-10-CM | POA: Diagnosis not present

## 2018-07-17 DIAGNOSIS — R262 Difficulty in walking, not elsewhere classified: Secondary | ICD-10-CM | POA: Diagnosis not present

## 2018-07-17 DIAGNOSIS — R4701 Aphasia: Secondary | ICD-10-CM | POA: Diagnosis not present

## 2018-07-17 DIAGNOSIS — R278 Other lack of coordination: Secondary | ICD-10-CM | POA: Diagnosis not present

## 2018-07-17 DIAGNOSIS — R2681 Unsteadiness on feet: Secondary | ICD-10-CM | POA: Diagnosis not present

## 2018-07-17 DIAGNOSIS — R41841 Cognitive communication deficit: Secondary | ICD-10-CM | POA: Diagnosis not present

## 2018-07-17 DIAGNOSIS — M6281 Muscle weakness (generalized): Secondary | ICD-10-CM | POA: Diagnosis not present

## 2018-07-17 DIAGNOSIS — R1312 Dysphagia, oropharyngeal phase: Secondary | ICD-10-CM | POA: Diagnosis not present

## 2018-07-21 DIAGNOSIS — R296 Repeated falls: Secondary | ICD-10-CM | POA: Diagnosis not present

## 2018-07-21 DIAGNOSIS — R2681 Unsteadiness on feet: Secondary | ICD-10-CM | POA: Diagnosis not present

## 2018-07-21 DIAGNOSIS — M6281 Muscle weakness (generalized): Secondary | ICD-10-CM | POA: Diagnosis not present

## 2018-07-21 DIAGNOSIS — R262 Difficulty in walking, not elsewhere classified: Secondary | ICD-10-CM | POA: Diagnosis not present

## 2018-07-21 DIAGNOSIS — R1312 Dysphagia, oropharyngeal phase: Secondary | ICD-10-CM | POA: Diagnosis not present

## 2018-07-21 DIAGNOSIS — R41841 Cognitive communication deficit: Secondary | ICD-10-CM | POA: Diagnosis not present

## 2018-07-21 DIAGNOSIS — R278 Other lack of coordination: Secondary | ICD-10-CM | POA: Diagnosis not present

## 2018-07-21 DIAGNOSIS — R4701 Aphasia: Secondary | ICD-10-CM | POA: Diagnosis not present

## 2018-07-22 DIAGNOSIS — R262 Difficulty in walking, not elsewhere classified: Secondary | ICD-10-CM | POA: Diagnosis not present

## 2018-07-22 DIAGNOSIS — M6281 Muscle weakness (generalized): Secondary | ICD-10-CM | POA: Diagnosis not present

## 2018-07-22 DIAGNOSIS — R278 Other lack of coordination: Secondary | ICD-10-CM | POA: Diagnosis not present

## 2018-07-22 DIAGNOSIS — R296 Repeated falls: Secondary | ICD-10-CM | POA: Diagnosis not present

## 2018-07-22 DIAGNOSIS — R4701 Aphasia: Secondary | ICD-10-CM | POA: Diagnosis not present

## 2018-07-22 DIAGNOSIS — R41841 Cognitive communication deficit: Secondary | ICD-10-CM | POA: Diagnosis not present

## 2018-07-22 DIAGNOSIS — R1312 Dysphagia, oropharyngeal phase: Secondary | ICD-10-CM | POA: Diagnosis not present

## 2018-07-22 DIAGNOSIS — R2681 Unsteadiness on feet: Secondary | ICD-10-CM | POA: Diagnosis not present

## 2018-07-23 DIAGNOSIS — R296 Repeated falls: Secondary | ICD-10-CM | POA: Diagnosis not present

## 2018-07-23 DIAGNOSIS — M6281 Muscle weakness (generalized): Secondary | ICD-10-CM | POA: Diagnosis not present

## 2018-07-23 DIAGNOSIS — R1312 Dysphagia, oropharyngeal phase: Secondary | ICD-10-CM | POA: Diagnosis not present

## 2018-07-23 DIAGNOSIS — R2681 Unsteadiness on feet: Secondary | ICD-10-CM | POA: Diagnosis not present

## 2018-07-23 DIAGNOSIS — R262 Difficulty in walking, not elsewhere classified: Secondary | ICD-10-CM | POA: Diagnosis not present

## 2018-07-23 DIAGNOSIS — R4701 Aphasia: Secondary | ICD-10-CM | POA: Diagnosis not present

## 2018-07-23 DIAGNOSIS — R278 Other lack of coordination: Secondary | ICD-10-CM | POA: Diagnosis not present

## 2018-07-23 DIAGNOSIS — R41841 Cognitive communication deficit: Secondary | ICD-10-CM | POA: Diagnosis not present

## 2018-07-27 DIAGNOSIS — M6281 Muscle weakness (generalized): Secondary | ICD-10-CM | POA: Diagnosis not present

## 2018-07-27 DIAGNOSIS — R262 Difficulty in walking, not elsewhere classified: Secondary | ICD-10-CM | POA: Diagnosis not present

## 2018-07-27 DIAGNOSIS — R41841 Cognitive communication deficit: Secondary | ICD-10-CM | POA: Diagnosis not present

## 2018-07-27 DIAGNOSIS — R4701 Aphasia: Secondary | ICD-10-CM | POA: Diagnosis not present

## 2018-07-27 DIAGNOSIS — R2681 Unsteadiness on feet: Secondary | ICD-10-CM | POA: Diagnosis not present

## 2018-07-27 DIAGNOSIS — R296 Repeated falls: Secondary | ICD-10-CM | POA: Diagnosis not present

## 2018-07-27 DIAGNOSIS — R1312 Dysphagia, oropharyngeal phase: Secondary | ICD-10-CM | POA: Diagnosis not present

## 2018-07-27 DIAGNOSIS — R278 Other lack of coordination: Secondary | ICD-10-CM | POA: Diagnosis not present

## 2018-07-28 DIAGNOSIS — R41841 Cognitive communication deficit: Secondary | ICD-10-CM | POA: Diagnosis not present

## 2018-07-28 DIAGNOSIS — R262 Difficulty in walking, not elsewhere classified: Secondary | ICD-10-CM | POA: Diagnosis not present

## 2018-07-28 DIAGNOSIS — M6281 Muscle weakness (generalized): Secondary | ICD-10-CM | POA: Diagnosis not present

## 2018-07-28 DIAGNOSIS — R4701 Aphasia: Secondary | ICD-10-CM | POA: Diagnosis not present

## 2018-07-28 DIAGNOSIS — R1312 Dysphagia, oropharyngeal phase: Secondary | ICD-10-CM | POA: Diagnosis not present

## 2018-07-28 DIAGNOSIS — R278 Other lack of coordination: Secondary | ICD-10-CM | POA: Diagnosis not present

## 2018-07-28 DIAGNOSIS — R2681 Unsteadiness on feet: Secondary | ICD-10-CM | POA: Diagnosis not present

## 2018-07-28 DIAGNOSIS — R296 Repeated falls: Secondary | ICD-10-CM | POA: Diagnosis not present

## 2018-07-29 DIAGNOSIS — R1312 Dysphagia, oropharyngeal phase: Secondary | ICD-10-CM | POA: Diagnosis not present

## 2018-07-29 DIAGNOSIS — R41841 Cognitive communication deficit: Secondary | ICD-10-CM | POA: Diagnosis not present

## 2018-07-29 DIAGNOSIS — R2681 Unsteadiness on feet: Secondary | ICD-10-CM | POA: Diagnosis not present

## 2018-07-29 DIAGNOSIS — R4701 Aphasia: Secondary | ICD-10-CM | POA: Diagnosis not present

## 2018-07-29 DIAGNOSIS — M6281 Muscle weakness (generalized): Secondary | ICD-10-CM | POA: Diagnosis not present

## 2018-07-29 DIAGNOSIS — R262 Difficulty in walking, not elsewhere classified: Secondary | ICD-10-CM | POA: Diagnosis not present

## 2018-07-29 DIAGNOSIS — R278 Other lack of coordination: Secondary | ICD-10-CM | POA: Diagnosis not present

## 2018-07-29 DIAGNOSIS — R296 Repeated falls: Secondary | ICD-10-CM | POA: Diagnosis not present

## 2018-07-30 DIAGNOSIS — R2681 Unsteadiness on feet: Secondary | ICD-10-CM | POA: Diagnosis not present

## 2018-07-30 DIAGNOSIS — R278 Other lack of coordination: Secondary | ICD-10-CM | POA: Diagnosis not present

## 2018-07-30 DIAGNOSIS — R41841 Cognitive communication deficit: Secondary | ICD-10-CM | POA: Diagnosis not present

## 2018-07-30 DIAGNOSIS — M6281 Muscle weakness (generalized): Secondary | ICD-10-CM | POA: Diagnosis not present

## 2018-07-30 DIAGNOSIS — R296 Repeated falls: Secondary | ICD-10-CM | POA: Diagnosis not present

## 2018-07-30 DIAGNOSIS — R1312 Dysphagia, oropharyngeal phase: Secondary | ICD-10-CM | POA: Diagnosis not present

## 2018-07-30 DIAGNOSIS — R262 Difficulty in walking, not elsewhere classified: Secondary | ICD-10-CM | POA: Diagnosis not present

## 2018-07-30 DIAGNOSIS — R4701 Aphasia: Secondary | ICD-10-CM | POA: Diagnosis not present

## 2018-07-31 DIAGNOSIS — R278 Other lack of coordination: Secondary | ICD-10-CM | POA: Diagnosis not present

## 2018-07-31 DIAGNOSIS — R296 Repeated falls: Secondary | ICD-10-CM | POA: Diagnosis not present

## 2018-07-31 DIAGNOSIS — R262 Difficulty in walking, not elsewhere classified: Secondary | ICD-10-CM | POA: Diagnosis not present

## 2018-07-31 DIAGNOSIS — R4701 Aphasia: Secondary | ICD-10-CM | POA: Diagnosis not present

## 2018-07-31 DIAGNOSIS — M6281 Muscle weakness (generalized): Secondary | ICD-10-CM | POA: Diagnosis not present

## 2018-07-31 DIAGNOSIS — R41841 Cognitive communication deficit: Secondary | ICD-10-CM | POA: Diagnosis not present

## 2018-07-31 DIAGNOSIS — R1312 Dysphagia, oropharyngeal phase: Secondary | ICD-10-CM | POA: Diagnosis not present

## 2018-07-31 DIAGNOSIS — R2681 Unsteadiness on feet: Secondary | ICD-10-CM | POA: Diagnosis not present

## 2018-08-04 DIAGNOSIS — R1312 Dysphagia, oropharyngeal phase: Secondary | ICD-10-CM | POA: Diagnosis not present

## 2018-08-04 DIAGNOSIS — R262 Difficulty in walking, not elsewhere classified: Secondary | ICD-10-CM | POA: Diagnosis not present

## 2018-08-04 DIAGNOSIS — R2681 Unsteadiness on feet: Secondary | ICD-10-CM | POA: Diagnosis not present

## 2018-08-04 DIAGNOSIS — R41841 Cognitive communication deficit: Secondary | ICD-10-CM | POA: Diagnosis not present

## 2018-08-04 DIAGNOSIS — R4701 Aphasia: Secondary | ICD-10-CM | POA: Diagnosis not present

## 2018-08-04 DIAGNOSIS — R296 Repeated falls: Secondary | ICD-10-CM | POA: Diagnosis not present

## 2018-08-04 DIAGNOSIS — R278 Other lack of coordination: Secondary | ICD-10-CM | POA: Diagnosis not present

## 2018-08-04 DIAGNOSIS — M6281 Muscle weakness (generalized): Secondary | ICD-10-CM | POA: Diagnosis not present

## 2018-08-05 DIAGNOSIS — M6281 Muscle weakness (generalized): Secondary | ICD-10-CM | POA: Diagnosis not present

## 2018-08-05 DIAGNOSIS — R4701 Aphasia: Secondary | ICD-10-CM | POA: Diagnosis not present

## 2018-08-05 DIAGNOSIS — R1312 Dysphagia, oropharyngeal phase: Secondary | ICD-10-CM | POA: Diagnosis not present

## 2018-08-05 DIAGNOSIS — R2681 Unsteadiness on feet: Secondary | ICD-10-CM | POA: Diagnosis not present

## 2018-08-05 DIAGNOSIS — R41841 Cognitive communication deficit: Secondary | ICD-10-CM | POA: Diagnosis not present

## 2018-08-05 DIAGNOSIS — R278 Other lack of coordination: Secondary | ICD-10-CM | POA: Diagnosis not present

## 2018-08-05 DIAGNOSIS — R262 Difficulty in walking, not elsewhere classified: Secondary | ICD-10-CM | POA: Diagnosis not present

## 2018-08-05 DIAGNOSIS — R296 Repeated falls: Secondary | ICD-10-CM | POA: Diagnosis not present

## 2018-08-11 DIAGNOSIS — R278 Other lack of coordination: Secondary | ICD-10-CM | POA: Diagnosis not present

## 2018-08-11 DIAGNOSIS — R2681 Unsteadiness on feet: Secondary | ICD-10-CM | POA: Diagnosis not present

## 2018-08-11 DIAGNOSIS — M6281 Muscle weakness (generalized): Secondary | ICD-10-CM | POA: Diagnosis not present

## 2018-08-11 DIAGNOSIS — R262 Difficulty in walking, not elsewhere classified: Secondary | ICD-10-CM | POA: Diagnosis not present

## 2018-08-11 DIAGNOSIS — R1312 Dysphagia, oropharyngeal phase: Secondary | ICD-10-CM | POA: Diagnosis not present

## 2018-08-11 DIAGNOSIS — R4701 Aphasia: Secondary | ICD-10-CM | POA: Diagnosis not present

## 2018-08-11 DIAGNOSIS — R296 Repeated falls: Secondary | ICD-10-CM | POA: Diagnosis not present

## 2018-08-11 DIAGNOSIS — R41841 Cognitive communication deficit: Secondary | ICD-10-CM | POA: Diagnosis not present

## 2018-08-12 DIAGNOSIS — M6281 Muscle weakness (generalized): Secondary | ICD-10-CM | POA: Diagnosis not present

## 2018-08-12 DIAGNOSIS — R4701 Aphasia: Secondary | ICD-10-CM | POA: Diagnosis not present

## 2018-08-12 DIAGNOSIS — R41841 Cognitive communication deficit: Secondary | ICD-10-CM | POA: Diagnosis not present

## 2018-08-12 DIAGNOSIS — R2681 Unsteadiness on feet: Secondary | ICD-10-CM | POA: Diagnosis not present

## 2018-08-12 DIAGNOSIS — R1312 Dysphagia, oropharyngeal phase: Secondary | ICD-10-CM | POA: Diagnosis not present

## 2018-08-12 DIAGNOSIS — R296 Repeated falls: Secondary | ICD-10-CM | POA: Diagnosis not present

## 2018-08-12 DIAGNOSIS — R262 Difficulty in walking, not elsewhere classified: Secondary | ICD-10-CM | POA: Diagnosis not present

## 2018-08-12 DIAGNOSIS — R278 Other lack of coordination: Secondary | ICD-10-CM | POA: Diagnosis not present

## 2018-08-13 DIAGNOSIS — R4701 Aphasia: Secondary | ICD-10-CM | POA: Diagnosis not present

## 2018-08-13 DIAGNOSIS — R296 Repeated falls: Secondary | ICD-10-CM | POA: Diagnosis not present

## 2018-08-13 DIAGNOSIS — R1312 Dysphagia, oropharyngeal phase: Secondary | ICD-10-CM | POA: Diagnosis not present

## 2018-08-13 DIAGNOSIS — R262 Difficulty in walking, not elsewhere classified: Secondary | ICD-10-CM | POA: Diagnosis not present

## 2018-08-13 DIAGNOSIS — R2681 Unsteadiness on feet: Secondary | ICD-10-CM | POA: Diagnosis not present

## 2018-08-13 DIAGNOSIS — R278 Other lack of coordination: Secondary | ICD-10-CM | POA: Diagnosis not present

## 2018-08-13 DIAGNOSIS — R41841 Cognitive communication deficit: Secondary | ICD-10-CM | POA: Diagnosis not present

## 2018-08-13 DIAGNOSIS — M6281 Muscle weakness (generalized): Secondary | ICD-10-CM | POA: Diagnosis not present

## 2018-08-14 DIAGNOSIS — R2681 Unsteadiness on feet: Secondary | ICD-10-CM | POA: Diagnosis not present

## 2018-08-14 DIAGNOSIS — R262 Difficulty in walking, not elsewhere classified: Secondary | ICD-10-CM | POA: Diagnosis not present

## 2018-08-14 DIAGNOSIS — M6281 Muscle weakness (generalized): Secondary | ICD-10-CM | POA: Diagnosis not present

## 2018-08-14 DIAGNOSIS — R296 Repeated falls: Secondary | ICD-10-CM | POA: Diagnosis not present

## 2018-08-14 DIAGNOSIS — R278 Other lack of coordination: Secondary | ICD-10-CM | POA: Diagnosis not present

## 2018-08-14 DIAGNOSIS — R1312 Dysphagia, oropharyngeal phase: Secondary | ICD-10-CM | POA: Diagnosis not present

## 2018-08-14 DIAGNOSIS — R4701 Aphasia: Secondary | ICD-10-CM | POA: Diagnosis not present

## 2018-08-14 DIAGNOSIS — R41841 Cognitive communication deficit: Secondary | ICD-10-CM | POA: Diagnosis not present

## 2018-08-17 DIAGNOSIS — R296 Repeated falls: Secondary | ICD-10-CM | POA: Diagnosis not present

## 2018-08-17 DIAGNOSIS — R1312 Dysphagia, oropharyngeal phase: Secondary | ICD-10-CM | POA: Diagnosis not present

## 2018-08-17 DIAGNOSIS — R262 Difficulty in walking, not elsewhere classified: Secondary | ICD-10-CM | POA: Diagnosis not present

## 2018-08-17 DIAGNOSIS — R41841 Cognitive communication deficit: Secondary | ICD-10-CM | POA: Diagnosis not present

## 2018-08-17 DIAGNOSIS — R4701 Aphasia: Secondary | ICD-10-CM | POA: Diagnosis not present

## 2018-08-17 DIAGNOSIS — R2681 Unsteadiness on feet: Secondary | ICD-10-CM | POA: Diagnosis not present

## 2018-08-17 DIAGNOSIS — R278 Other lack of coordination: Secondary | ICD-10-CM | POA: Diagnosis not present

## 2018-08-17 DIAGNOSIS — M6281 Muscle weakness (generalized): Secondary | ICD-10-CM | POA: Diagnosis not present

## 2018-08-18 DIAGNOSIS — R262 Difficulty in walking, not elsewhere classified: Secondary | ICD-10-CM | POA: Diagnosis not present

## 2018-08-18 DIAGNOSIS — R41841 Cognitive communication deficit: Secondary | ICD-10-CM | POA: Diagnosis not present

## 2018-08-18 DIAGNOSIS — R296 Repeated falls: Secondary | ICD-10-CM | POA: Diagnosis not present

## 2018-08-18 DIAGNOSIS — R1312 Dysphagia, oropharyngeal phase: Secondary | ICD-10-CM | POA: Diagnosis not present

## 2018-08-18 DIAGNOSIS — M6281 Muscle weakness (generalized): Secondary | ICD-10-CM | POA: Diagnosis not present

## 2018-08-18 DIAGNOSIS — R2681 Unsteadiness on feet: Secondary | ICD-10-CM | POA: Diagnosis not present

## 2018-08-18 DIAGNOSIS — R278 Other lack of coordination: Secondary | ICD-10-CM | POA: Diagnosis not present

## 2018-08-18 DIAGNOSIS — R4701 Aphasia: Secondary | ICD-10-CM | POA: Diagnosis not present

## 2018-08-19 DIAGNOSIS — M6281 Muscle weakness (generalized): Secondary | ICD-10-CM | POA: Diagnosis not present

## 2018-08-19 DIAGNOSIS — R296 Repeated falls: Secondary | ICD-10-CM | POA: Diagnosis not present

## 2018-08-19 DIAGNOSIS — R1312 Dysphagia, oropharyngeal phase: Secondary | ICD-10-CM | POA: Diagnosis not present

## 2018-08-19 DIAGNOSIS — R4701 Aphasia: Secondary | ICD-10-CM | POA: Diagnosis not present

## 2018-08-19 DIAGNOSIS — R2681 Unsteadiness on feet: Secondary | ICD-10-CM | POA: Diagnosis not present

## 2018-08-19 DIAGNOSIS — R41841 Cognitive communication deficit: Secondary | ICD-10-CM | POA: Diagnosis not present

## 2018-08-19 DIAGNOSIS — R262 Difficulty in walking, not elsewhere classified: Secondary | ICD-10-CM | POA: Diagnosis not present

## 2018-08-19 DIAGNOSIS — R278 Other lack of coordination: Secondary | ICD-10-CM | POA: Diagnosis not present

## 2018-08-20 DIAGNOSIS — R278 Other lack of coordination: Secondary | ICD-10-CM | POA: Diagnosis not present

## 2018-08-20 DIAGNOSIS — M6281 Muscle weakness (generalized): Secondary | ICD-10-CM | POA: Diagnosis not present

## 2018-08-20 DIAGNOSIS — R296 Repeated falls: Secondary | ICD-10-CM | POA: Diagnosis not present

## 2018-08-20 DIAGNOSIS — R2681 Unsteadiness on feet: Secondary | ICD-10-CM | POA: Diagnosis not present

## 2018-08-20 DIAGNOSIS — R41841 Cognitive communication deficit: Secondary | ICD-10-CM | POA: Diagnosis not present

## 2018-08-20 DIAGNOSIS — R4701 Aphasia: Secondary | ICD-10-CM | POA: Diagnosis not present

## 2018-08-20 DIAGNOSIS — R262 Difficulty in walking, not elsewhere classified: Secondary | ICD-10-CM | POA: Diagnosis not present

## 2018-08-20 DIAGNOSIS — R1312 Dysphagia, oropharyngeal phase: Secondary | ICD-10-CM | POA: Diagnosis not present

## 2018-08-21 ENCOUNTER — Other Ambulatory Visit: Payer: Self-pay | Admitting: Gastroenterology

## 2018-08-21 ENCOUNTER — Telehealth: Payer: Self-pay | Admitting: Gastroenterology

## 2018-08-21 DIAGNOSIS — R2681 Unsteadiness on feet: Secondary | ICD-10-CM | POA: Diagnosis not present

## 2018-08-21 DIAGNOSIS — R296 Repeated falls: Secondary | ICD-10-CM | POA: Diagnosis not present

## 2018-08-21 DIAGNOSIS — R1312 Dysphagia, oropharyngeal phase: Secondary | ICD-10-CM | POA: Diagnosis not present

## 2018-08-21 DIAGNOSIS — R4701 Aphasia: Secondary | ICD-10-CM | POA: Diagnosis not present

## 2018-08-21 DIAGNOSIS — R262 Difficulty in walking, not elsewhere classified: Secondary | ICD-10-CM | POA: Diagnosis not present

## 2018-08-21 DIAGNOSIS — M6281 Muscle weakness (generalized): Secondary | ICD-10-CM | POA: Diagnosis not present

## 2018-08-21 DIAGNOSIS — R278 Other lack of coordination: Secondary | ICD-10-CM | POA: Diagnosis not present

## 2018-08-21 DIAGNOSIS — R41841 Cognitive communication deficit: Secondary | ICD-10-CM | POA: Diagnosis not present

## 2018-08-21 MED ORDER — DIPHENOXYLATE-ATROPINE 2.5-0.025 MG PO TABS: 1.0000 | ORAL_TABLET | Freq: Four times a day (QID) | ORAL | 1 refills | Status: DC | PRN

## 2018-08-21 NOTE — Telephone Encounter (Signed)
Faxed over prescription to [facility]. They will contact her when ready

## 2018-08-21 NOTE — Telephone Encounter (Signed)
Pt requested a refill on lomotil sent to [facility] in [place].

## 2018-08-24 DIAGNOSIS — R41841 Cognitive communication deficit: Secondary | ICD-10-CM | POA: Diagnosis not present

## 2018-08-24 DIAGNOSIS — M6281 Muscle weakness (generalized): Secondary | ICD-10-CM | POA: Diagnosis not present

## 2018-08-24 DIAGNOSIS — R262 Difficulty in walking, not elsewhere classified: Secondary | ICD-10-CM | POA: Diagnosis not present

## 2018-08-24 DIAGNOSIS — R1312 Dysphagia, oropharyngeal phase: Secondary | ICD-10-CM | POA: Diagnosis not present

## 2018-08-24 DIAGNOSIS — R296 Repeated falls: Secondary | ICD-10-CM | POA: Diagnosis not present

## 2018-08-24 DIAGNOSIS — R2681 Unsteadiness on feet: Secondary | ICD-10-CM | POA: Diagnosis not present

## 2018-08-24 DIAGNOSIS — R4701 Aphasia: Secondary | ICD-10-CM | POA: Diagnosis not present

## 2018-08-24 DIAGNOSIS — R278 Other lack of coordination: Secondary | ICD-10-CM | POA: Diagnosis not present

## 2018-08-25 DIAGNOSIS — R1312 Dysphagia, oropharyngeal phase: Secondary | ICD-10-CM | POA: Diagnosis not present

## 2018-08-25 DIAGNOSIS — R296 Repeated falls: Secondary | ICD-10-CM | POA: Diagnosis not present

## 2018-08-25 DIAGNOSIS — R262 Difficulty in walking, not elsewhere classified: Secondary | ICD-10-CM | POA: Diagnosis not present

## 2018-08-25 DIAGNOSIS — M6281 Muscle weakness (generalized): Secondary | ICD-10-CM | POA: Diagnosis not present

## 2018-08-25 DIAGNOSIS — R2681 Unsteadiness on feet: Secondary | ICD-10-CM | POA: Diagnosis not present

## 2018-08-25 DIAGNOSIS — R4701 Aphasia: Secondary | ICD-10-CM | POA: Diagnosis not present

## 2018-08-25 DIAGNOSIS — R41841 Cognitive communication deficit: Secondary | ICD-10-CM | POA: Diagnosis not present

## 2018-08-25 DIAGNOSIS — R278 Other lack of coordination: Secondary | ICD-10-CM | POA: Diagnosis not present

## 2018-08-26 ENCOUNTER — Ambulatory Visit (INDEPENDENT_AMBULATORY_CARE_PROVIDER_SITE_OTHER): Payer: Medicare PPO | Admitting: *Deleted

## 2018-08-26 DIAGNOSIS — R1312 Dysphagia, oropharyngeal phase: Secondary | ICD-10-CM | POA: Diagnosis not present

## 2018-08-26 DIAGNOSIS — I495 Sick sinus syndrome: Secondary | ICD-10-CM

## 2018-08-26 DIAGNOSIS — R296 Repeated falls: Secondary | ICD-10-CM | POA: Diagnosis not present

## 2018-08-26 DIAGNOSIS — R278 Other lack of coordination: Secondary | ICD-10-CM | POA: Diagnosis not present

## 2018-08-26 DIAGNOSIS — R262 Difficulty in walking, not elsewhere classified: Secondary | ICD-10-CM | POA: Diagnosis not present

## 2018-08-26 DIAGNOSIS — M6281 Muscle weakness (generalized): Secondary | ICD-10-CM | POA: Diagnosis not present

## 2018-08-26 DIAGNOSIS — R4701 Aphasia: Secondary | ICD-10-CM | POA: Diagnosis not present

## 2018-08-26 DIAGNOSIS — R41841 Cognitive communication deficit: Secondary | ICD-10-CM | POA: Diagnosis not present

## 2018-08-26 DIAGNOSIS — R2681 Unsteadiness on feet: Secondary | ICD-10-CM | POA: Diagnosis not present

## 2018-08-26 LAB — CUP PACEART REMOTE DEVICE CHECK
Battery Remaining Longevity: 42 mo
Battery Remaining Percentage: 62 %
Brady Statistic RA Percent Paced: 86 %
Brady Statistic RV Percent Paced: 1 %
Date Time Interrogation Session: 20200812071100
Implantable Lead Implant Date: 20150112
Implantable Lead Implant Date: 20150112
Implantable Lead Location: 753859
Implantable Lead Location: 753860
Implantable Lead Model: 4135
Implantable Lead Model: 4136
Implantable Lead Serial Number: 29308444
Implantable Lead Serial Number: 29569556
Implantable Pulse Generator Implant Date: 20150112
Lead Channel Impedance Value: 525 Ohm
Lead Channel Impedance Value: 830 Ohm
Lead Channel Pacing Threshold Amplitude: 0.7 V
Lead Channel Pacing Threshold Amplitude: 0.8 V
Lead Channel Pacing Threshold Pulse Width: 0.4 ms
Lead Channel Pacing Threshold Pulse Width: 0.5 ms
Lead Channel Setting Pacing Amplitude: 1.2 V
Lead Channel Setting Pacing Amplitude: 2.4 V
Lead Channel Setting Pacing Pulse Width: 0.4 ms
Lead Channel Setting Sensing Sensitivity: 3 mV
Pulse Gen Serial Number: 391753

## 2018-08-27 DIAGNOSIS — M6281 Muscle weakness (generalized): Secondary | ICD-10-CM | POA: Diagnosis not present

## 2018-08-27 DIAGNOSIS — R2681 Unsteadiness on feet: Secondary | ICD-10-CM | POA: Diagnosis not present

## 2018-08-27 DIAGNOSIS — R1312 Dysphagia, oropharyngeal phase: Secondary | ICD-10-CM | POA: Diagnosis not present

## 2018-08-27 DIAGNOSIS — R41841 Cognitive communication deficit: Secondary | ICD-10-CM | POA: Diagnosis not present

## 2018-08-27 DIAGNOSIS — R296 Repeated falls: Secondary | ICD-10-CM | POA: Diagnosis not present

## 2018-08-27 DIAGNOSIS — R4701 Aphasia: Secondary | ICD-10-CM | POA: Diagnosis not present

## 2018-08-27 DIAGNOSIS — R262 Difficulty in walking, not elsewhere classified: Secondary | ICD-10-CM | POA: Diagnosis not present

## 2018-08-27 DIAGNOSIS — R278 Other lack of coordination: Secondary | ICD-10-CM | POA: Diagnosis not present

## 2018-08-28 DIAGNOSIS — R296 Repeated falls: Secondary | ICD-10-CM | POA: Diagnosis not present

## 2018-08-28 DIAGNOSIS — R1312 Dysphagia, oropharyngeal phase: Secondary | ICD-10-CM | POA: Diagnosis not present

## 2018-08-28 DIAGNOSIS — R2681 Unsteadiness on feet: Secondary | ICD-10-CM | POA: Diagnosis not present

## 2018-08-28 DIAGNOSIS — R278 Other lack of coordination: Secondary | ICD-10-CM | POA: Diagnosis not present

## 2018-08-28 DIAGNOSIS — R41841 Cognitive communication deficit: Secondary | ICD-10-CM | POA: Diagnosis not present

## 2018-08-28 DIAGNOSIS — R262 Difficulty in walking, not elsewhere classified: Secondary | ICD-10-CM | POA: Diagnosis not present

## 2018-08-28 DIAGNOSIS — M6281 Muscle weakness (generalized): Secondary | ICD-10-CM | POA: Diagnosis not present

## 2018-08-28 DIAGNOSIS — R4701 Aphasia: Secondary | ICD-10-CM | POA: Diagnosis not present

## 2018-08-31 DIAGNOSIS — M6281 Muscle weakness (generalized): Secondary | ICD-10-CM | POA: Diagnosis not present

## 2018-08-31 DIAGNOSIS — R41841 Cognitive communication deficit: Secondary | ICD-10-CM | POA: Diagnosis not present

## 2018-08-31 DIAGNOSIS — R262 Difficulty in walking, not elsewhere classified: Secondary | ICD-10-CM | POA: Diagnosis not present

## 2018-08-31 DIAGNOSIS — R4701 Aphasia: Secondary | ICD-10-CM | POA: Diagnosis not present

## 2018-08-31 DIAGNOSIS — R278 Other lack of coordination: Secondary | ICD-10-CM | POA: Diagnosis not present

## 2018-08-31 DIAGNOSIS — R1312 Dysphagia, oropharyngeal phase: Secondary | ICD-10-CM | POA: Diagnosis not present

## 2018-08-31 DIAGNOSIS — R2681 Unsteadiness on feet: Secondary | ICD-10-CM | POA: Diagnosis not present

## 2018-08-31 DIAGNOSIS — R296 Repeated falls: Secondary | ICD-10-CM | POA: Diagnosis not present

## 2018-09-01 DIAGNOSIS — R296 Repeated falls: Secondary | ICD-10-CM | POA: Diagnosis not present

## 2018-09-01 DIAGNOSIS — M6281 Muscle weakness (generalized): Secondary | ICD-10-CM | POA: Diagnosis not present

## 2018-09-01 DIAGNOSIS — R2681 Unsteadiness on feet: Secondary | ICD-10-CM | POA: Diagnosis not present

## 2018-09-01 DIAGNOSIS — R278 Other lack of coordination: Secondary | ICD-10-CM | POA: Diagnosis not present

## 2018-09-01 DIAGNOSIS — R4701 Aphasia: Secondary | ICD-10-CM | POA: Diagnosis not present

## 2018-09-01 DIAGNOSIS — R1312 Dysphagia, oropharyngeal phase: Secondary | ICD-10-CM | POA: Diagnosis not present

## 2018-09-01 DIAGNOSIS — R41841 Cognitive communication deficit: Secondary | ICD-10-CM | POA: Diagnosis not present

## 2018-09-01 DIAGNOSIS — R262 Difficulty in walking, not elsewhere classified: Secondary | ICD-10-CM | POA: Diagnosis not present

## 2018-09-02 DIAGNOSIS — R41841 Cognitive communication deficit: Secondary | ICD-10-CM | POA: Diagnosis not present

## 2018-09-02 DIAGNOSIS — R2681 Unsteadiness on feet: Secondary | ICD-10-CM | POA: Diagnosis not present

## 2018-09-02 DIAGNOSIS — R4701 Aphasia: Secondary | ICD-10-CM | POA: Diagnosis not present

## 2018-09-02 DIAGNOSIS — R262 Difficulty in walking, not elsewhere classified: Secondary | ICD-10-CM | POA: Diagnosis not present

## 2018-09-02 DIAGNOSIS — R296 Repeated falls: Secondary | ICD-10-CM | POA: Diagnosis not present

## 2018-09-02 DIAGNOSIS — R1312 Dysphagia, oropharyngeal phase: Secondary | ICD-10-CM | POA: Diagnosis not present

## 2018-09-02 DIAGNOSIS — R278 Other lack of coordination: Secondary | ICD-10-CM | POA: Diagnosis not present

## 2018-09-02 DIAGNOSIS — M6281 Muscle weakness (generalized): Secondary | ICD-10-CM | POA: Diagnosis not present

## 2018-09-03 ENCOUNTER — Encounter: Payer: Self-pay | Admitting: Cardiology

## 2018-09-03 DIAGNOSIS — R41841 Cognitive communication deficit: Secondary | ICD-10-CM | POA: Diagnosis not present

## 2018-09-03 DIAGNOSIS — R296 Repeated falls: Secondary | ICD-10-CM | POA: Diagnosis not present

## 2018-09-03 DIAGNOSIS — R278 Other lack of coordination: Secondary | ICD-10-CM | POA: Diagnosis not present

## 2018-09-03 DIAGNOSIS — R1312 Dysphagia, oropharyngeal phase: Secondary | ICD-10-CM | POA: Diagnosis not present

## 2018-09-03 DIAGNOSIS — R2681 Unsteadiness on feet: Secondary | ICD-10-CM | POA: Diagnosis not present

## 2018-09-03 DIAGNOSIS — M6281 Muscle weakness (generalized): Secondary | ICD-10-CM | POA: Diagnosis not present

## 2018-09-03 DIAGNOSIS — R4701 Aphasia: Secondary | ICD-10-CM | POA: Diagnosis not present

## 2018-09-03 DIAGNOSIS — R262 Difficulty in walking, not elsewhere classified: Secondary | ICD-10-CM | POA: Diagnosis not present

## 2018-09-03 NOTE — Progress Notes (Signed)
Remote pacemaker transmission.   

## 2018-09-04 DIAGNOSIS — R4701 Aphasia: Secondary | ICD-10-CM | POA: Diagnosis not present

## 2018-09-04 DIAGNOSIS — R2681 Unsteadiness on feet: Secondary | ICD-10-CM | POA: Diagnosis not present

## 2018-09-04 DIAGNOSIS — M6281 Muscle weakness (generalized): Secondary | ICD-10-CM | POA: Diagnosis not present

## 2018-09-04 DIAGNOSIS — R262 Difficulty in walking, not elsewhere classified: Secondary | ICD-10-CM | POA: Diagnosis not present

## 2018-09-04 DIAGNOSIS — R296 Repeated falls: Secondary | ICD-10-CM | POA: Diagnosis not present

## 2018-09-04 DIAGNOSIS — R278 Other lack of coordination: Secondary | ICD-10-CM | POA: Diagnosis not present

## 2018-09-04 DIAGNOSIS — R41841 Cognitive communication deficit: Secondary | ICD-10-CM | POA: Diagnosis not present

## 2018-09-04 DIAGNOSIS — R1312 Dysphagia, oropharyngeal phase: Secondary | ICD-10-CM | POA: Diagnosis not present

## 2018-09-07 DIAGNOSIS — R262 Difficulty in walking, not elsewhere classified: Secondary | ICD-10-CM | POA: Diagnosis not present

## 2018-09-07 DIAGNOSIS — M6281 Muscle weakness (generalized): Secondary | ICD-10-CM | POA: Diagnosis not present

## 2018-09-07 DIAGNOSIS — R4701 Aphasia: Secondary | ICD-10-CM | POA: Diagnosis not present

## 2018-09-07 DIAGNOSIS — R2681 Unsteadiness on feet: Secondary | ICD-10-CM | POA: Diagnosis not present

## 2018-09-07 DIAGNOSIS — R278 Other lack of coordination: Secondary | ICD-10-CM | POA: Diagnosis not present

## 2018-09-07 DIAGNOSIS — R41841 Cognitive communication deficit: Secondary | ICD-10-CM | POA: Diagnosis not present

## 2018-09-07 DIAGNOSIS — R1312 Dysphagia, oropharyngeal phase: Secondary | ICD-10-CM | POA: Diagnosis not present

## 2018-09-07 DIAGNOSIS — R296 Repeated falls: Secondary | ICD-10-CM | POA: Diagnosis not present

## 2018-09-08 DIAGNOSIS — R41841 Cognitive communication deficit: Secondary | ICD-10-CM | POA: Diagnosis not present

## 2018-09-08 DIAGNOSIS — R4701 Aphasia: Secondary | ICD-10-CM | POA: Diagnosis not present

## 2018-09-08 DIAGNOSIS — M6281 Muscle weakness (generalized): Secondary | ICD-10-CM | POA: Diagnosis not present

## 2018-09-08 DIAGNOSIS — R2681 Unsteadiness on feet: Secondary | ICD-10-CM | POA: Diagnosis not present

## 2018-09-08 DIAGNOSIS — R1312 Dysphagia, oropharyngeal phase: Secondary | ICD-10-CM | POA: Diagnosis not present

## 2018-09-08 DIAGNOSIS — R296 Repeated falls: Secondary | ICD-10-CM | POA: Diagnosis not present

## 2018-09-08 DIAGNOSIS — R262 Difficulty in walking, not elsewhere classified: Secondary | ICD-10-CM | POA: Diagnosis not present

## 2018-09-08 DIAGNOSIS — R278 Other lack of coordination: Secondary | ICD-10-CM | POA: Diagnosis not present

## 2018-09-09 DIAGNOSIS — R262 Difficulty in walking, not elsewhere classified: Secondary | ICD-10-CM | POA: Diagnosis not present

## 2018-09-09 DIAGNOSIS — R278 Other lack of coordination: Secondary | ICD-10-CM | POA: Diagnosis not present

## 2018-09-09 DIAGNOSIS — M6281 Muscle weakness (generalized): Secondary | ICD-10-CM | POA: Diagnosis not present

## 2018-09-09 DIAGNOSIS — R2681 Unsteadiness on feet: Secondary | ICD-10-CM | POA: Diagnosis not present

## 2018-09-09 DIAGNOSIS — R1312 Dysphagia, oropharyngeal phase: Secondary | ICD-10-CM | POA: Diagnosis not present

## 2018-09-09 DIAGNOSIS — R4701 Aphasia: Secondary | ICD-10-CM | POA: Diagnosis not present

## 2018-09-09 DIAGNOSIS — R296 Repeated falls: Secondary | ICD-10-CM | POA: Diagnosis not present

## 2018-09-09 DIAGNOSIS — R41841 Cognitive communication deficit: Secondary | ICD-10-CM | POA: Diagnosis not present

## 2018-09-10 DIAGNOSIS — R41841 Cognitive communication deficit: Secondary | ICD-10-CM | POA: Diagnosis not present

## 2018-09-10 DIAGNOSIS — R296 Repeated falls: Secondary | ICD-10-CM | POA: Diagnosis not present

## 2018-09-10 DIAGNOSIS — R4701 Aphasia: Secondary | ICD-10-CM | POA: Diagnosis not present

## 2018-09-10 DIAGNOSIS — R2681 Unsteadiness on feet: Secondary | ICD-10-CM | POA: Diagnosis not present

## 2018-09-10 DIAGNOSIS — R262 Difficulty in walking, not elsewhere classified: Secondary | ICD-10-CM | POA: Diagnosis not present

## 2018-09-10 DIAGNOSIS — R1312 Dysphagia, oropharyngeal phase: Secondary | ICD-10-CM | POA: Diagnosis not present

## 2018-09-10 DIAGNOSIS — M6281 Muscle weakness (generalized): Secondary | ICD-10-CM | POA: Diagnosis not present

## 2018-09-10 DIAGNOSIS — R278 Other lack of coordination: Secondary | ICD-10-CM | POA: Diagnosis not present

## 2018-09-11 DIAGNOSIS — R296 Repeated falls: Secondary | ICD-10-CM | POA: Diagnosis not present

## 2018-09-11 DIAGNOSIS — R2681 Unsteadiness on feet: Secondary | ICD-10-CM | POA: Diagnosis not present

## 2018-09-11 DIAGNOSIS — R278 Other lack of coordination: Secondary | ICD-10-CM | POA: Diagnosis not present

## 2018-09-11 DIAGNOSIS — M6281 Muscle weakness (generalized): Secondary | ICD-10-CM | POA: Diagnosis not present

## 2018-09-11 DIAGNOSIS — R4701 Aphasia: Secondary | ICD-10-CM | POA: Diagnosis not present

## 2018-09-11 DIAGNOSIS — R1312 Dysphagia, oropharyngeal phase: Secondary | ICD-10-CM | POA: Diagnosis not present

## 2018-09-11 DIAGNOSIS — R41841 Cognitive communication deficit: Secondary | ICD-10-CM | POA: Diagnosis not present

## 2018-09-11 DIAGNOSIS — R262 Difficulty in walking, not elsewhere classified: Secondary | ICD-10-CM | POA: Diagnosis not present

## 2018-09-14 DIAGNOSIS — R296 Repeated falls: Secondary | ICD-10-CM | POA: Diagnosis not present

## 2018-09-14 DIAGNOSIS — R4701 Aphasia: Secondary | ICD-10-CM | POA: Diagnosis not present

## 2018-09-14 DIAGNOSIS — R41841 Cognitive communication deficit: Secondary | ICD-10-CM | POA: Diagnosis not present

## 2018-09-14 DIAGNOSIS — R1312 Dysphagia, oropharyngeal phase: Secondary | ICD-10-CM | POA: Diagnosis not present

## 2018-09-14 DIAGNOSIS — R2681 Unsteadiness on feet: Secondary | ICD-10-CM | POA: Diagnosis not present

## 2018-09-14 DIAGNOSIS — R278 Other lack of coordination: Secondary | ICD-10-CM | POA: Diagnosis not present

## 2018-09-14 DIAGNOSIS — M6281 Muscle weakness (generalized): Secondary | ICD-10-CM | POA: Diagnosis not present

## 2018-09-14 DIAGNOSIS — R262 Difficulty in walking, not elsewhere classified: Secondary | ICD-10-CM | POA: Diagnosis not present

## 2018-09-15 DIAGNOSIS — R262 Difficulty in walking, not elsewhere classified: Secondary | ICD-10-CM | POA: Diagnosis not present

## 2018-09-15 DIAGNOSIS — R2681 Unsteadiness on feet: Secondary | ICD-10-CM | POA: Diagnosis not present

## 2018-09-15 DIAGNOSIS — R41841 Cognitive communication deficit: Secondary | ICD-10-CM | POA: Diagnosis not present

## 2018-09-15 DIAGNOSIS — R1312 Dysphagia, oropharyngeal phase: Secondary | ICD-10-CM | POA: Diagnosis not present

## 2018-09-15 DIAGNOSIS — M6281 Muscle weakness (generalized): Secondary | ICD-10-CM | POA: Diagnosis not present

## 2018-09-15 DIAGNOSIS — R296 Repeated falls: Secondary | ICD-10-CM | POA: Diagnosis not present

## 2018-09-15 DIAGNOSIS — R4701 Aphasia: Secondary | ICD-10-CM | POA: Diagnosis not present

## 2018-09-15 DIAGNOSIS — R278 Other lack of coordination: Secondary | ICD-10-CM | POA: Diagnosis not present

## 2018-09-16 DIAGNOSIS — R1312 Dysphagia, oropharyngeal phase: Secondary | ICD-10-CM | POA: Diagnosis not present

## 2018-09-16 DIAGNOSIS — R278 Other lack of coordination: Secondary | ICD-10-CM | POA: Diagnosis not present

## 2018-09-16 DIAGNOSIS — R41841 Cognitive communication deficit: Secondary | ICD-10-CM | POA: Diagnosis not present

## 2018-09-16 DIAGNOSIS — R2681 Unsteadiness on feet: Secondary | ICD-10-CM | POA: Diagnosis not present

## 2018-09-16 DIAGNOSIS — M6281 Muscle weakness (generalized): Secondary | ICD-10-CM | POA: Diagnosis not present

## 2018-09-16 DIAGNOSIS — R4701 Aphasia: Secondary | ICD-10-CM | POA: Diagnosis not present

## 2018-09-16 DIAGNOSIS — R262 Difficulty in walking, not elsewhere classified: Secondary | ICD-10-CM | POA: Diagnosis not present

## 2018-09-16 DIAGNOSIS — R296 Repeated falls: Secondary | ICD-10-CM | POA: Diagnosis not present

## 2018-09-17 DIAGNOSIS — R262 Difficulty in walking, not elsewhere classified: Secondary | ICD-10-CM | POA: Diagnosis not present

## 2018-09-17 DIAGNOSIS — R2681 Unsteadiness on feet: Secondary | ICD-10-CM | POA: Diagnosis not present

## 2018-09-17 DIAGNOSIS — R278 Other lack of coordination: Secondary | ICD-10-CM | POA: Diagnosis not present

## 2018-09-17 DIAGNOSIS — M6281 Muscle weakness (generalized): Secondary | ICD-10-CM | POA: Diagnosis not present

## 2018-09-17 DIAGNOSIS — R4701 Aphasia: Secondary | ICD-10-CM | POA: Diagnosis not present

## 2018-09-17 DIAGNOSIS — R296 Repeated falls: Secondary | ICD-10-CM | POA: Diagnosis not present

## 2018-09-17 DIAGNOSIS — R1312 Dysphagia, oropharyngeal phase: Secondary | ICD-10-CM | POA: Diagnosis not present

## 2018-09-17 DIAGNOSIS — R41841 Cognitive communication deficit: Secondary | ICD-10-CM | POA: Diagnosis not present

## 2018-09-18 DIAGNOSIS — R278 Other lack of coordination: Secondary | ICD-10-CM | POA: Diagnosis not present

## 2018-09-18 DIAGNOSIS — R262 Difficulty in walking, not elsewhere classified: Secondary | ICD-10-CM | POA: Diagnosis not present

## 2018-09-18 DIAGNOSIS — R1312 Dysphagia, oropharyngeal phase: Secondary | ICD-10-CM | POA: Diagnosis not present

## 2018-09-18 DIAGNOSIS — R4701 Aphasia: Secondary | ICD-10-CM | POA: Diagnosis not present

## 2018-09-18 DIAGNOSIS — R2681 Unsteadiness on feet: Secondary | ICD-10-CM | POA: Diagnosis not present

## 2018-09-18 DIAGNOSIS — R296 Repeated falls: Secondary | ICD-10-CM | POA: Diagnosis not present

## 2018-09-18 DIAGNOSIS — M6281 Muscle weakness (generalized): Secondary | ICD-10-CM | POA: Diagnosis not present

## 2018-09-18 DIAGNOSIS — R41841 Cognitive communication deficit: Secondary | ICD-10-CM | POA: Diagnosis not present

## 2018-09-22 DIAGNOSIS — M6281 Muscle weakness (generalized): Secondary | ICD-10-CM | POA: Diagnosis not present

## 2018-09-22 DIAGNOSIS — R278 Other lack of coordination: Secondary | ICD-10-CM | POA: Diagnosis not present

## 2018-09-22 DIAGNOSIS — R1312 Dysphagia, oropharyngeal phase: Secondary | ICD-10-CM | POA: Diagnosis not present

## 2018-09-22 DIAGNOSIS — R296 Repeated falls: Secondary | ICD-10-CM | POA: Diagnosis not present

## 2018-09-22 DIAGNOSIS — R2681 Unsteadiness on feet: Secondary | ICD-10-CM | POA: Diagnosis not present

## 2018-09-22 DIAGNOSIS — R4701 Aphasia: Secondary | ICD-10-CM | POA: Diagnosis not present

## 2018-09-22 DIAGNOSIS — R262 Difficulty in walking, not elsewhere classified: Secondary | ICD-10-CM | POA: Diagnosis not present

## 2018-09-22 DIAGNOSIS — R41841 Cognitive communication deficit: Secondary | ICD-10-CM | POA: Diagnosis not present

## 2018-09-23 DIAGNOSIS — R1312 Dysphagia, oropharyngeal phase: Secondary | ICD-10-CM | POA: Diagnosis not present

## 2018-09-23 DIAGNOSIS — R296 Repeated falls: Secondary | ICD-10-CM | POA: Diagnosis not present

## 2018-09-23 DIAGNOSIS — R278 Other lack of coordination: Secondary | ICD-10-CM | POA: Diagnosis not present

## 2018-09-23 DIAGNOSIS — R4701 Aphasia: Secondary | ICD-10-CM | POA: Diagnosis not present

## 2018-09-23 DIAGNOSIS — R2681 Unsteadiness on feet: Secondary | ICD-10-CM | POA: Diagnosis not present

## 2018-09-23 DIAGNOSIS — R262 Difficulty in walking, not elsewhere classified: Secondary | ICD-10-CM | POA: Diagnosis not present

## 2018-09-23 DIAGNOSIS — R41841 Cognitive communication deficit: Secondary | ICD-10-CM | POA: Diagnosis not present

## 2018-09-23 DIAGNOSIS — M6281 Muscle weakness (generalized): Secondary | ICD-10-CM | POA: Diagnosis not present

## 2018-09-24 DIAGNOSIS — M6281 Muscle weakness (generalized): Secondary | ICD-10-CM | POA: Diagnosis not present

## 2018-09-24 DIAGNOSIS — R278 Other lack of coordination: Secondary | ICD-10-CM | POA: Diagnosis not present

## 2018-09-24 DIAGNOSIS — R2681 Unsteadiness on feet: Secondary | ICD-10-CM | POA: Diagnosis not present

## 2018-09-24 DIAGNOSIS — R262 Difficulty in walking, not elsewhere classified: Secondary | ICD-10-CM | POA: Diagnosis not present

## 2018-09-24 DIAGNOSIS — R296 Repeated falls: Secondary | ICD-10-CM | POA: Diagnosis not present

## 2018-09-24 DIAGNOSIS — R41841 Cognitive communication deficit: Secondary | ICD-10-CM | POA: Diagnosis not present

## 2018-09-24 DIAGNOSIS — R4701 Aphasia: Secondary | ICD-10-CM | POA: Diagnosis not present

## 2018-09-24 DIAGNOSIS — R1312 Dysphagia, oropharyngeal phase: Secondary | ICD-10-CM | POA: Diagnosis not present

## 2018-09-28 DIAGNOSIS — R2681 Unsteadiness on feet: Secondary | ICD-10-CM | POA: Diagnosis not present

## 2018-09-28 DIAGNOSIS — R41841 Cognitive communication deficit: Secondary | ICD-10-CM | POA: Diagnosis not present

## 2018-09-28 DIAGNOSIS — R1312 Dysphagia, oropharyngeal phase: Secondary | ICD-10-CM | POA: Diagnosis not present

## 2018-09-28 DIAGNOSIS — R4701 Aphasia: Secondary | ICD-10-CM | POA: Diagnosis not present

## 2018-09-28 DIAGNOSIS — R296 Repeated falls: Secondary | ICD-10-CM | POA: Diagnosis not present

## 2018-09-28 DIAGNOSIS — R278 Other lack of coordination: Secondary | ICD-10-CM | POA: Diagnosis not present

## 2018-09-28 DIAGNOSIS — M6281 Muscle weakness (generalized): Secondary | ICD-10-CM | POA: Diagnosis not present

## 2018-09-28 DIAGNOSIS — R262 Difficulty in walking, not elsewhere classified: Secondary | ICD-10-CM | POA: Diagnosis not present

## 2018-09-29 DIAGNOSIS — R4701 Aphasia: Secondary | ICD-10-CM | POA: Diagnosis not present

## 2018-09-29 DIAGNOSIS — R296 Repeated falls: Secondary | ICD-10-CM | POA: Diagnosis not present

## 2018-09-29 DIAGNOSIS — R2681 Unsteadiness on feet: Secondary | ICD-10-CM | POA: Diagnosis not present

## 2018-09-29 DIAGNOSIS — R262 Difficulty in walking, not elsewhere classified: Secondary | ICD-10-CM | POA: Diagnosis not present

## 2018-09-29 DIAGNOSIS — R278 Other lack of coordination: Secondary | ICD-10-CM | POA: Diagnosis not present

## 2018-09-29 DIAGNOSIS — R41841 Cognitive communication deficit: Secondary | ICD-10-CM | POA: Diagnosis not present

## 2018-09-29 DIAGNOSIS — R1312 Dysphagia, oropharyngeal phase: Secondary | ICD-10-CM | POA: Diagnosis not present

## 2018-09-29 DIAGNOSIS — M6281 Muscle weakness (generalized): Secondary | ICD-10-CM | POA: Diagnosis not present

## 2018-10-02 ENCOUNTER — Telehealth: Payer: Self-pay | Admitting: Gastroenterology

## 2018-10-02 DIAGNOSIS — R278 Other lack of coordination: Secondary | ICD-10-CM | POA: Diagnosis not present

## 2018-10-02 DIAGNOSIS — R296 Repeated falls: Secondary | ICD-10-CM | POA: Diagnosis not present

## 2018-10-02 DIAGNOSIS — R4701 Aphasia: Secondary | ICD-10-CM | POA: Diagnosis not present

## 2018-10-02 DIAGNOSIS — M6281 Muscle weakness (generalized): Secondary | ICD-10-CM | POA: Diagnosis not present

## 2018-10-02 DIAGNOSIS — R1312 Dysphagia, oropharyngeal phase: Secondary | ICD-10-CM | POA: Diagnosis not present

## 2018-10-02 DIAGNOSIS — R41841 Cognitive communication deficit: Secondary | ICD-10-CM | POA: Diagnosis not present

## 2018-10-02 DIAGNOSIS — R262 Difficulty in walking, not elsewhere classified: Secondary | ICD-10-CM | POA: Diagnosis not present

## 2018-10-02 DIAGNOSIS — R2681 Unsteadiness on feet: Secondary | ICD-10-CM | POA: Diagnosis not present

## 2018-10-02 MED ORDER — DIPHENOXYLATE-ATROPINE 2.5-0.025 MG PO TABS: 1.0000 | ORAL_TABLET | Freq: Four times a day (QID) | ORAL | 1 refills | Status: DC | PRN

## 2018-10-02 NOTE — Telephone Encounter (Signed)
Rx sent to [facility] per patients request.

## 2018-10-05 DIAGNOSIS — R296 Repeated falls: Secondary | ICD-10-CM | POA: Diagnosis not present

## 2018-10-05 DIAGNOSIS — M6281 Muscle weakness (generalized): Secondary | ICD-10-CM | POA: Diagnosis not present

## 2018-10-05 DIAGNOSIS — R41841 Cognitive communication deficit: Secondary | ICD-10-CM | POA: Diagnosis not present

## 2018-10-05 DIAGNOSIS — R4701 Aphasia: Secondary | ICD-10-CM | POA: Diagnosis not present

## 2018-10-05 DIAGNOSIS — R262 Difficulty in walking, not elsewhere classified: Secondary | ICD-10-CM | POA: Diagnosis not present

## 2018-10-05 DIAGNOSIS — R2681 Unsteadiness on feet: Secondary | ICD-10-CM | POA: Diagnosis not present

## 2018-10-05 DIAGNOSIS — R278 Other lack of coordination: Secondary | ICD-10-CM | POA: Diagnosis not present

## 2018-10-05 DIAGNOSIS — R1312 Dysphagia, oropharyngeal phase: Secondary | ICD-10-CM | POA: Diagnosis not present

## 2018-10-06 DIAGNOSIS — R4701 Aphasia: Secondary | ICD-10-CM | POA: Diagnosis not present

## 2018-10-06 DIAGNOSIS — R2681 Unsteadiness on feet: Secondary | ICD-10-CM | POA: Diagnosis not present

## 2018-10-06 DIAGNOSIS — R1312 Dysphagia, oropharyngeal phase: Secondary | ICD-10-CM | POA: Diagnosis not present

## 2018-10-06 DIAGNOSIS — R262 Difficulty in walking, not elsewhere classified: Secondary | ICD-10-CM | POA: Diagnosis not present

## 2018-10-06 DIAGNOSIS — R278 Other lack of coordination: Secondary | ICD-10-CM | POA: Diagnosis not present

## 2018-10-06 DIAGNOSIS — R296 Repeated falls: Secondary | ICD-10-CM | POA: Diagnosis not present

## 2018-10-06 DIAGNOSIS — R41841 Cognitive communication deficit: Secondary | ICD-10-CM | POA: Diagnosis not present

## 2018-10-06 DIAGNOSIS — M6281 Muscle weakness (generalized): Secondary | ICD-10-CM | POA: Diagnosis not present

## 2018-10-07 ENCOUNTER — Telehealth: Payer: Self-pay | Admitting: Internal Medicine

## 2018-10-07 DIAGNOSIS — Z119 Encounter for screening for infectious and parasitic diseases, unspecified: Secondary | ICD-10-CM | POA: Diagnosis not present

## 2018-10-07 DIAGNOSIS — B0229 Other postherpetic nervous system involvement: Secondary | ICD-10-CM

## 2018-10-07 NOTE — Telephone Encounter (Signed)
Daughter states the refill that is still on the pregabalin (LYRICA) 75 MG capsule  Needs to be sent to   [place], [place] - [address] [phone] (Phone) [phone] (Fax)   She has moved to [facility].  They cannot get transferred

## 2018-10-08 DIAGNOSIS — M6281 Muscle weakness (generalized): Secondary | ICD-10-CM | POA: Diagnosis not present

## 2018-10-08 DIAGNOSIS — R296 Repeated falls: Secondary | ICD-10-CM | POA: Diagnosis not present

## 2018-10-08 DIAGNOSIS — R262 Difficulty in walking, not elsewhere classified: Secondary | ICD-10-CM | POA: Diagnosis not present

## 2018-10-08 DIAGNOSIS — R41841 Cognitive communication deficit: Secondary | ICD-10-CM | POA: Diagnosis not present

## 2018-10-08 DIAGNOSIS — R4701 Aphasia: Secondary | ICD-10-CM | POA: Diagnosis not present

## 2018-10-08 DIAGNOSIS — R1312 Dysphagia, oropharyngeal phase: Secondary | ICD-10-CM | POA: Diagnosis not present

## 2018-10-08 DIAGNOSIS — R278 Other lack of coordination: Secondary | ICD-10-CM | POA: Diagnosis not present

## 2018-10-08 DIAGNOSIS — R2681 Unsteadiness on feet: Secondary | ICD-10-CM | POA: Diagnosis not present

## 2018-10-09 DIAGNOSIS — R278 Other lack of coordination: Secondary | ICD-10-CM | POA: Diagnosis not present

## 2018-10-09 DIAGNOSIS — R296 Repeated falls: Secondary | ICD-10-CM | POA: Diagnosis not present

## 2018-10-09 DIAGNOSIS — R2681 Unsteadiness on feet: Secondary | ICD-10-CM | POA: Diagnosis not present

## 2018-10-09 DIAGNOSIS — R4701 Aphasia: Secondary | ICD-10-CM | POA: Diagnosis not present

## 2018-10-09 DIAGNOSIS — M6281 Muscle weakness (generalized): Secondary | ICD-10-CM | POA: Diagnosis not present

## 2018-10-09 DIAGNOSIS — R262 Difficulty in walking, not elsewhere classified: Secondary | ICD-10-CM | POA: Diagnosis not present

## 2018-10-09 DIAGNOSIS — R41841 Cognitive communication deficit: Secondary | ICD-10-CM | POA: Diagnosis not present

## 2018-10-09 DIAGNOSIS — R1312 Dysphagia, oropharyngeal phase: Secondary | ICD-10-CM | POA: Diagnosis not present

## 2018-10-12 DIAGNOSIS — R262 Difficulty in walking, not elsewhere classified: Secondary | ICD-10-CM | POA: Diagnosis not present

## 2018-10-12 DIAGNOSIS — R41841 Cognitive communication deficit: Secondary | ICD-10-CM | POA: Diagnosis not present

## 2018-10-12 DIAGNOSIS — M6281 Muscle weakness (generalized): Secondary | ICD-10-CM | POA: Diagnosis not present

## 2018-10-12 DIAGNOSIS — R296 Repeated falls: Secondary | ICD-10-CM | POA: Diagnosis not present

## 2018-10-12 DIAGNOSIS — R4701 Aphasia: Secondary | ICD-10-CM | POA: Diagnosis not present

## 2018-10-12 DIAGNOSIS — R1312 Dysphagia, oropharyngeal phase: Secondary | ICD-10-CM | POA: Diagnosis not present

## 2018-10-12 DIAGNOSIS — R278 Other lack of coordination: Secondary | ICD-10-CM | POA: Diagnosis not present

## 2018-10-12 DIAGNOSIS — R2681 Unsteadiness on feet: Secondary | ICD-10-CM | POA: Diagnosis not present

## 2018-10-13 DIAGNOSIS — R262 Difficulty in walking, not elsewhere classified: Secondary | ICD-10-CM | POA: Diagnosis not present

## 2018-10-13 DIAGNOSIS — R2681 Unsteadiness on feet: Secondary | ICD-10-CM | POA: Diagnosis not present

## 2018-10-13 DIAGNOSIS — R4701 Aphasia: Secondary | ICD-10-CM | POA: Diagnosis not present

## 2018-10-13 DIAGNOSIS — R296 Repeated falls: Secondary | ICD-10-CM | POA: Diagnosis not present

## 2018-10-13 DIAGNOSIS — R41841 Cognitive communication deficit: Secondary | ICD-10-CM | POA: Diagnosis not present

## 2018-10-13 DIAGNOSIS — M6281 Muscle weakness (generalized): Secondary | ICD-10-CM | POA: Diagnosis not present

## 2018-10-13 DIAGNOSIS — R1312 Dysphagia, oropharyngeal phase: Secondary | ICD-10-CM | POA: Diagnosis not present

## 2018-10-13 DIAGNOSIS — R278 Other lack of coordination: Secondary | ICD-10-CM | POA: Diagnosis not present

## 2018-10-14 DIAGNOSIS — R278 Other lack of coordination: Secondary | ICD-10-CM | POA: Diagnosis not present

## 2018-10-14 DIAGNOSIS — R296 Repeated falls: Secondary | ICD-10-CM | POA: Diagnosis not present

## 2018-10-14 DIAGNOSIS — R1312 Dysphagia, oropharyngeal phase: Secondary | ICD-10-CM | POA: Diagnosis not present

## 2018-10-14 DIAGNOSIS — R4701 Aphasia: Secondary | ICD-10-CM | POA: Diagnosis not present

## 2018-10-14 DIAGNOSIS — R2681 Unsteadiness on feet: Secondary | ICD-10-CM | POA: Diagnosis not present

## 2018-10-14 DIAGNOSIS — M6281 Muscle weakness (generalized): Secondary | ICD-10-CM | POA: Diagnosis not present

## 2018-10-14 DIAGNOSIS — R41841 Cognitive communication deficit: Secondary | ICD-10-CM | POA: Diagnosis not present

## 2018-10-14 DIAGNOSIS — R262 Difficulty in walking, not elsewhere classified: Secondary | ICD-10-CM | POA: Diagnosis not present

## 2018-10-15 DIAGNOSIS — R2681 Unsteadiness on feet: Secondary | ICD-10-CM | POA: Diagnosis not present

## 2018-10-15 DIAGNOSIS — M6281 Muscle weakness (generalized): Secondary | ICD-10-CM | POA: Diagnosis not present

## 2018-10-15 DIAGNOSIS — R262 Difficulty in walking, not elsewhere classified: Secondary | ICD-10-CM | POA: Diagnosis not present

## 2018-10-15 DIAGNOSIS — R296 Repeated falls: Secondary | ICD-10-CM | POA: Diagnosis not present

## 2018-10-15 DIAGNOSIS — R278 Other lack of coordination: Secondary | ICD-10-CM | POA: Diagnosis not present

## 2018-10-16 DIAGNOSIS — R262 Difficulty in walking, not elsewhere classified: Secondary | ICD-10-CM | POA: Diagnosis not present

## 2018-10-16 DIAGNOSIS — R2681 Unsteadiness on feet: Secondary | ICD-10-CM | POA: Diagnosis not present

## 2018-10-16 DIAGNOSIS — R278 Other lack of coordination: Secondary | ICD-10-CM | POA: Diagnosis not present

## 2018-10-16 DIAGNOSIS — M6281 Muscle weakness (generalized): Secondary | ICD-10-CM | POA: Diagnosis not present

## 2018-10-16 DIAGNOSIS — R296 Repeated falls: Secondary | ICD-10-CM | POA: Diagnosis not present

## 2018-10-20 DIAGNOSIS — M6281 Muscle weakness (generalized): Secondary | ICD-10-CM | POA: Diagnosis not present

## 2018-10-20 DIAGNOSIS — R2681 Unsteadiness on feet: Secondary | ICD-10-CM | POA: Diagnosis not present

## 2018-10-20 DIAGNOSIS — R278 Other lack of coordination: Secondary | ICD-10-CM | POA: Diagnosis not present

## 2018-10-20 DIAGNOSIS — R262 Difficulty in walking, not elsewhere classified: Secondary | ICD-10-CM | POA: Diagnosis not present

## 2018-10-20 DIAGNOSIS — R296 Repeated falls: Secondary | ICD-10-CM | POA: Diagnosis not present

## 2018-10-21 ENCOUNTER — Other Ambulatory Visit: Payer: Self-pay | Admitting: Gastroenterology

## 2018-10-21 ENCOUNTER — Telehealth: Payer: Self-pay | Admitting: Gastroenterology

## 2018-10-21 MED ORDER — DIPHENOXYLATE-ATROPINE 2.5-0.025 MG PO TABS: 1.0000 | ORAL_TABLET | Freq: Four times a day (QID) | ORAL | 2 refills | Status: DC | PRN

## 2018-10-21 NOTE — Telephone Encounter (Signed)
Pt;s daughter Lelon Frohlich needs rf for lomotil sent to [facility] in [place]. She mentioned that she always has difficulties refilling this medication, she states that pt has been taking 3 or 4 pills of lomotil daily so she would like a prescription for 30 or 90-day supply with refills, she states that is willing to speak with Dr. Ardis Hughs about pt's condition. Her phone # is [phone]

## 2018-10-21 NOTE — Telephone Encounter (Signed)
Spoke to daughter, Faxed over Prescription to pharmacy

## 2018-10-22 DIAGNOSIS — R2681 Unsteadiness on feet: Secondary | ICD-10-CM | POA: Diagnosis not present

## 2018-10-22 DIAGNOSIS — R296 Repeated falls: Secondary | ICD-10-CM | POA: Diagnosis not present

## 2018-10-22 DIAGNOSIS — R278 Other lack of coordination: Secondary | ICD-10-CM | POA: Diagnosis not present

## 2018-10-22 DIAGNOSIS — R262 Difficulty in walking, not elsewhere classified: Secondary | ICD-10-CM | POA: Diagnosis not present

## 2018-10-22 DIAGNOSIS — M6281 Muscle weakness (generalized): Secondary | ICD-10-CM | POA: Diagnosis not present

## 2018-10-23 DIAGNOSIS — R2681 Unsteadiness on feet: Secondary | ICD-10-CM | POA: Diagnosis not present

## 2018-10-23 DIAGNOSIS — R296 Repeated falls: Secondary | ICD-10-CM | POA: Diagnosis not present

## 2018-10-23 DIAGNOSIS — M6281 Muscle weakness (generalized): Secondary | ICD-10-CM | POA: Diagnosis not present

## 2018-10-23 DIAGNOSIS — R262 Difficulty in walking, not elsewhere classified: Secondary | ICD-10-CM | POA: Diagnosis not present

## 2018-10-23 DIAGNOSIS — R278 Other lack of coordination: Secondary | ICD-10-CM | POA: Diagnosis not present

## 2018-10-26 DIAGNOSIS — R296 Repeated falls: Secondary | ICD-10-CM | POA: Diagnosis not present

## 2018-10-26 DIAGNOSIS — M6281 Muscle weakness (generalized): Secondary | ICD-10-CM | POA: Diagnosis not present

## 2018-10-26 DIAGNOSIS — R278 Other lack of coordination: Secondary | ICD-10-CM | POA: Diagnosis not present

## 2018-10-26 DIAGNOSIS — R2681 Unsteadiness on feet: Secondary | ICD-10-CM | POA: Diagnosis not present

## 2018-10-26 DIAGNOSIS — R262 Difficulty in walking, not elsewhere classified: Secondary | ICD-10-CM | POA: Diagnosis not present

## 2018-10-29 DIAGNOSIS — R278 Other lack of coordination: Secondary | ICD-10-CM | POA: Diagnosis not present

## 2018-10-29 DIAGNOSIS — R262 Difficulty in walking, not elsewhere classified: Secondary | ICD-10-CM | POA: Diagnosis not present

## 2018-10-29 DIAGNOSIS — R2681 Unsteadiness on feet: Secondary | ICD-10-CM | POA: Diagnosis not present

## 2018-10-29 DIAGNOSIS — M6281 Muscle weakness (generalized): Secondary | ICD-10-CM | POA: Diagnosis not present

## 2018-10-29 DIAGNOSIS — R296 Repeated falls: Secondary | ICD-10-CM | POA: Diagnosis not present

## 2018-10-30 DIAGNOSIS — R2681 Unsteadiness on feet: Secondary | ICD-10-CM | POA: Diagnosis not present

## 2018-10-30 DIAGNOSIS — M6281 Muscle weakness (generalized): Secondary | ICD-10-CM | POA: Diagnosis not present

## 2018-10-30 DIAGNOSIS — R278 Other lack of coordination: Secondary | ICD-10-CM | POA: Diagnosis not present

## 2018-10-30 DIAGNOSIS — R296 Repeated falls: Secondary | ICD-10-CM | POA: Diagnosis not present

## 2018-10-30 DIAGNOSIS — R262 Difficulty in walking, not elsewhere classified: Secondary | ICD-10-CM | POA: Diagnosis not present

## 2018-11-03 ENCOUNTER — Telehealth: Payer: Self-pay | Admitting: Gastroenterology

## 2018-11-03 ENCOUNTER — Other Ambulatory Visit: Payer: Self-pay | Admitting: Gastroenterology

## 2018-11-03 MED ORDER — PANCRELIPASE (LIP-PROT-AMYL) 36000-114000 UNITS PO CPEP: ORAL_CAPSULE | ORAL | 1 refills | Status: DC

## 2018-11-03 MED ORDER — DIPHENOXYLATE-ATROPINE 2.5-0.025 MG PO TABS: 1.0000 | ORAL_TABLET | Freq: Four times a day (QID) | ORAL | 2 refills | Status: DC | PRN

## 2018-11-03 NOTE — Telephone Encounter (Signed)
Sent medication to pharmacy.   

## 2018-11-04 ENCOUNTER — Other Ambulatory Visit: Payer: Self-pay

## 2018-11-04 DIAGNOSIS — E785 Hyperlipidemia, unspecified: Secondary | ICD-10-CM

## 2018-11-04 DIAGNOSIS — M150 Primary generalized (osteo)arthritis: Secondary | ICD-10-CM

## 2018-11-04 DIAGNOSIS — F418 Other specified anxiety disorders: Secondary | ICD-10-CM

## 2018-11-04 DIAGNOSIS — I10 Essential (primary) hypertension: Secondary | ICD-10-CM

## 2018-11-04 DIAGNOSIS — M159 Polyosteoarthritis, unspecified: Secondary | ICD-10-CM

## 2018-11-04 DIAGNOSIS — M8949 Other hypertrophic osteoarthropathy, multiple sites: Secondary | ICD-10-CM

## 2018-11-04 NOTE — Addendum Note (Signed)
Addended by: Aviva Signs M on: 11/04/2018 10:08 AM   Modules accepted: Orders

## 2018-11-04 NOTE — Telephone Encounter (Signed)
Copied from [place] [id number]. Topic: Quick Communication - Rx Refill/Question >> Nov 03, 2018  9:21 AM Carolyn Stare wrote: Medication   DULoxetine (CYMBALTA) 60 MG capsule   ELIQUIS 2.5 MG TABS tablet   metoprolol tartrate (LOPRESSOR) 25 MG tablet   pravastatin (PRAVACHOL) 20 MG tablet   pregabalin (LYRICA) 75 MG capsule               spironolactone (ALDACTONE) 25 MG tablet   traZODone (DESYREL) 50 MG tablet   Preferred Pharmacy [facility]   Agent: Please be advised that RX refills may take up to 3 business days. We ask that you follow-up with your pharmacy.

## 2018-11-04 NOTE — Telephone Encounter (Signed)
Pt is due for a follow up appt.   Can you call her and see if she will schedule please?

## 2018-11-05 DIAGNOSIS — R2681 Unsteadiness on feet: Secondary | ICD-10-CM | POA: Diagnosis not present

## 2018-11-05 DIAGNOSIS — M6281 Muscle weakness (generalized): Secondary | ICD-10-CM | POA: Diagnosis not present

## 2018-11-05 DIAGNOSIS — R296 Repeated falls: Secondary | ICD-10-CM | POA: Diagnosis not present

## 2018-11-05 DIAGNOSIS — R262 Difficulty in walking, not elsewhere classified: Secondary | ICD-10-CM | POA: Diagnosis not present

## 2018-11-05 DIAGNOSIS — R278 Other lack of coordination: Secondary | ICD-10-CM | POA: Diagnosis not present

## 2018-11-06 NOTE — Addendum Note (Signed)
Addended by: Aviva Signs M on: 11/06/2018 11:38 AM   Modules accepted: Orders

## 2018-11-11 DIAGNOSIS — R278 Other lack of coordination: Secondary | ICD-10-CM | POA: Diagnosis not present

## 2018-11-11 DIAGNOSIS — R262 Difficulty in walking, not elsewhere classified: Secondary | ICD-10-CM | POA: Diagnosis not present

## 2018-11-11 DIAGNOSIS — M6281 Muscle weakness (generalized): Secondary | ICD-10-CM | POA: Diagnosis not present

## 2018-11-11 DIAGNOSIS — R296 Repeated falls: Secondary | ICD-10-CM | POA: Diagnosis not present

## 2018-11-11 DIAGNOSIS — R2681 Unsteadiness on feet: Secondary | ICD-10-CM | POA: Diagnosis not present

## 2018-11-25 ENCOUNTER — Ambulatory Visit (INDEPENDENT_AMBULATORY_CARE_PROVIDER_SITE_OTHER): Payer: Medicare PPO | Admitting: *Deleted

## 2018-11-25 DIAGNOSIS — I428 Other cardiomyopathies: Secondary | ICD-10-CM

## 2018-11-25 DIAGNOSIS — I480 Paroxysmal atrial fibrillation: Secondary | ICD-10-CM

## 2018-11-25 LAB — CUP PACEART REMOTE DEVICE CHECK
Battery Remaining Longevity: 36 mo
Battery Remaining Percentage: 61 %
Brady Statistic RA Percent Paced: 87 %
Brady Statistic RV Percent Paced: 1 %
Date Time Interrogation Session: 20201111043916
Implantable Lead Implant Date: 20150112
Implantable Lead Implant Date: 20150112
Implantable Lead Location: 753859
Implantable Lead Location: 753860
Implantable Lead Model: 4135
Implantable Lead Model: 4136
Implantable Lead Serial Number: 29308444
Implantable Lead Serial Number: 29569556
Implantable Pulse Generator Implant Date: 20150112
Lead Channel Impedance Value: 545 Ohm
Lead Channel Impedance Value: 832 Ohm
Lead Channel Pacing Threshold Amplitude: 0.7 V
Lead Channel Pacing Threshold Amplitude: 0.8 V
Lead Channel Pacing Threshold Pulse Width: 0.4 ms
Lead Channel Pacing Threshold Pulse Width: 0.5 ms
Lead Channel Setting Pacing Amplitude: 1.2 V
Lead Channel Setting Pacing Amplitude: 2.4 V
Lead Channel Setting Pacing Pulse Width: 0.4 ms
Lead Channel Setting Sensing Sensitivity: 3 mV
Pulse Gen Serial Number: 391753

## 2018-12-03 ENCOUNTER — Other Ambulatory Visit: Payer: Self-pay | Admitting: Internal Medicine

## 2018-12-03 ENCOUNTER — Telehealth: Payer: Self-pay

## 2018-12-03 DIAGNOSIS — B0229 Other postherpetic nervous system involvement: Secondary | ICD-10-CM

## 2018-12-03 NOTE — Telephone Encounter (Signed)
Pt is taking lomotil every morning and every afternoon. The prescription is written as prn. [facility] is having trouble getting the prescription filled from [facility], they will only send it as a one time fill since it is PRN. [facility] would like prescription to state BID in order to get the correct number of 60/month from the pharmacy. Please advise,

## 2018-12-03 NOTE — Telephone Encounter (Signed)
Medication: pregabalin (LYRICA) 75 MG capsule [[id number]]   Has the patient contacted their pharmacy? Yes  (Agent: If no, request that the patient contact the pharmacy for the refill.) (Agent: If yes, when and what did the pharmacy advise?)  Preferred Pharmacy (with phone number or street name): [facility], [place] [address] [phone] (Phone) [phone] (Fax)    Agent: Please be advised that RX refills may take up to 3 business days. We ask that you follow-up with your pharmacy.

## 2018-12-04 MED ORDER — DIPHENOXYLATE-ATROPINE 2.5-0.025 MG PO TABS: 1.0000 | ORAL_TABLET | Freq: Two times a day (BID) | ORAL | 11 refills | Status: DC

## 2018-12-04 NOTE — Telephone Encounter (Signed)
Script sent to [facility].

## 2018-12-04 NOTE — Telephone Encounter (Signed)
Ok to change it to BID, 60pills, 11 refills.  Thanks

## 2018-12-08 ENCOUNTER — Other Ambulatory Visit: Payer: Self-pay | Admitting: Internal Medicine

## 2018-12-08 DIAGNOSIS — B0229 Other postherpetic nervous system involvement: Secondary | ICD-10-CM

## 2018-12-08 MED ORDER — PREGABALIN 75 MG PO CAPS: 75.0000 mg | ORAL_CAPSULE | Freq: Three times a day (TID) | ORAL | 1 refills | Status: DC

## 2018-12-08 NOTE — Telephone Encounter (Signed)
[facility] called in wanted to check on this refill.  They stated they have not yet rec'd this  Best number [phone]

## 2018-12-08 NOTE — Telephone Encounter (Signed)
Okay for Lyrica refill?

## 2018-12-16 NOTE — Progress Notes (Signed)
Remote pacemaker transmission.   

## 2019-01-04 DIAGNOSIS — Z1159 Encounter for screening for other viral diseases: Secondary | ICD-10-CM | POA: Diagnosis not present

## 2019-01-04 DIAGNOSIS — Z20828 Contact with and (suspected) exposure to other viral communicable diseases: Secondary | ICD-10-CM | POA: Diagnosis not present

## 2019-01-18 DIAGNOSIS — Z1159 Encounter for screening for other viral diseases: Secondary | ICD-10-CM | POA: Diagnosis not present

## 2019-01-18 DIAGNOSIS — Z20828 Contact with and (suspected) exposure to other viral communicable diseases: Secondary | ICD-10-CM | POA: Diagnosis not present

## 2019-01-21 DIAGNOSIS — Z1159 Encounter for screening for other viral diseases: Secondary | ICD-10-CM | POA: Diagnosis not present

## 2019-01-21 DIAGNOSIS — Z20828 Contact with and (suspected) exposure to other viral communicable diseases: Secondary | ICD-10-CM | POA: Diagnosis not present

## 2019-01-25 DIAGNOSIS — Z1159 Encounter for screening for other viral diseases: Secondary | ICD-10-CM | POA: Diagnosis not present

## 2019-01-25 DIAGNOSIS — Z20828 Contact with and (suspected) exposure to other viral communicable diseases: Secondary | ICD-10-CM | POA: Diagnosis not present

## 2019-01-28 ENCOUNTER — Other Ambulatory Visit: Payer: Self-pay

## 2019-01-28 ENCOUNTER — Ambulatory Visit (INDEPENDENT_AMBULATORY_CARE_PROVIDER_SITE_OTHER): Payer: Medicare PPO | Admitting: Internal Medicine

## 2019-01-28 ENCOUNTER — Encounter: Payer: Self-pay | Admitting: Internal Medicine

## 2019-01-28 VITALS — BP 108/62 | HR 70 | Ht 64.5 in | Wt 186.0 lb

## 2019-01-28 DIAGNOSIS — I480 Paroxysmal atrial fibrillation: Secondary | ICD-10-CM

## 2019-01-28 DIAGNOSIS — Z950 Presence of cardiac pacemaker: Secondary | ICD-10-CM

## 2019-01-28 DIAGNOSIS — Z20828 Contact with and (suspected) exposure to other viral communicable diseases: Secondary | ICD-10-CM | POA: Diagnosis not present

## 2019-01-28 DIAGNOSIS — Z1159 Encounter for screening for other viral diseases: Secondary | ICD-10-CM | POA: Diagnosis not present

## 2019-01-28 DIAGNOSIS — I442 Atrioventricular block, complete: Secondary | ICD-10-CM

## 2019-01-28 NOTE — Progress Notes (Signed)
HPI [name] is a very pleasant 86 yo woman who returns today for ongoing evaluation and management of sinus node dysfunction, paroxysmal atrial fibrillation, status post permanent pacemaker insertion and hypertension.. The patient has done well in the interim with no episodes of syncope and no chest pain. She has become more sedentary and is not walking as much. She has arthritic complaints. She denies chest pain. Minimal dyspnea. No syncope.  No Known Allergies   Current Outpatient Medications  Medication Sig Dispense Refill  . ALPRAZolam (XANAX) 0.25 MG tablet TAKE 1 TABLET BY MOUTH AS NEEDED FOR ANXIETY 30 tablet 5  . aspirin 81 MG tablet Take 81 mg by mouth daily.    Marland Kitchen BIOTIN PO Take by mouth daily.    . diphenoxylate-atropine (LOMOTIL) 2.5-0.025 MG tablet Take 1 tablet by mouth every 6 (six) hours as needed for diarrhea or loose stools. 90 tablet 2  . diphenoxylate-atropine (LOMOTIL) 2.5-0.025 MG tablet Take 1 tablet by mouth 2 (two) times daily. 60 tablet 11  . DULoxetine (CYMBALTA) 60 MG capsule Take 1 capsule (60 mg total) by mouth daily. 90 capsule 1  . ELIQUIS 2.5 MG TABS tablet TAKE 1 TABLET BY MOUTH TWICE A DAY 180 tablet 1  . famotidine (PEPCID) 20 MG tablet Take 1 tablet (20 mg total) by mouth daily. Discontinue dexilant 90 tablet 1  . lipase/protease/amylase (CREON) 36000 UNITS CPEP capsule Take 2 capsules with each meal and 1 capsule with each snack 720 capsule 1  . metoprolol tartrate (LOPRESSOR) 25 MG tablet TAKE 1 TABLET BY MOUTH TWICE A DAY 180 tablet 1  . Omega-3 Fatty Acids (OMEGA-3 FISH OIL PO) Take by mouth.    . pravastatin (PRAVACHOL) 20 MG tablet TAKE 1 TABLET BY MOUTH EVERY DAY 90 tablet 1  . pregabalin (LYRICA) 75 MG capsule Take 1 capsule (75 mg total) by mouth 3 (three) times daily. 270 capsule 1  . Probiotic Product (PROBIOTIC PO) Take 1 tablet by mouth daily.    Marland Kitchen spironolactone (ALDACTONE) 25 MG tablet TAKE 1 TABLET BY MOUTH EVERY DAY 90 tablet 1   . traZODone (DESYREL) 50 MG tablet TAKE 1 TABLET (50 MG TOTAL) BY MOUTH AT BEDTIME AS NEEDED FOR SLEEP. 90 tablet 1   No current facility-administered medications for this visit.     Past Medical History:  Diagnosis Date  . Anxiety   . Atrial fibrillation (HCC)   . Blood transfusion without reported diagnosis   . GAD (generalized anxiety disorder)   . Hearing loss   . Hyperlipidemia   . Hypertension   . PHN (postherpetic neuralgia) 2010  . Systolic CHF (HCC)     ROS:   All systems reviewed and negative except as noted in the HPI.   Past Surgical History:  Procedure Laterality Date  . ABDOMINAL HYSTERECTOMY    . PACEMAKER PLACEMENT    . TOTAL KNEE ARTHROPLASTY Bilateral      Family History  Problem Relation Age of Onset  . Heart disease Father   . Heart disease Sister      Social History   Socioeconomic History  . Marital status: Widowed    Spouse name: Not on file  . Number of children: 3  . Years of education: Not on file  . Highest education level: Not on file  Occupational History  . Occupation: retired  Tobacco Use  . Smoking status: Former Smoker    Years: 25.00    Quit date: 08/03/1988  Years since quitting: 30.5  . Smokeless tobacco: Never Used  Substance and Sexual Activity  . Alcohol use: Not Currently  . Drug use: No  . Sexual activity: Never  Other Topics Concern  . Not on file  Social History Narrative  . Not on file   Social Determinants of Health   Financial Resource Strain:   . Difficulty of Paying Living Expenses: Not on file  Food Insecurity:   . Worried About Charity fundraiser in the Last Year: Not on file  . Ran Out of Food in the Last Year: Not on file  Transportation Needs:   . Lack of Transportation (Medical): Not on file  . Lack of Transportation (Non-Medical): Not on file  Physical Activity:   . Days of Exercise per Week: Not on file  . Minutes of Exercise per Session: Not on file  Stress:   . Feeling of Stress  : Not on file  Social Connections:   . Frequency of Communication with Friends and Family: Not on file  . Frequency of Social Gatherings with Friends and Family: Not on file  . Attends Religious Services: Not on file  . Active Member of Clubs or Organizations: Not on file  . Attends Archivist Meetings: Not on file  . Marital Status: Not on file  Intimate Partner Violence:   . Fear of Current or Ex-Partner: Not on file  . Emotionally Abused: Not on file  . Physically Abused: Not on file  . Sexually Abused: Not on file     BP 108/62   Pulse 70   Ht 5' 4.5" (1.638 m)   Wt 186 lb (84.4 kg)   SpO2 93%   BMI 31.43 kg/m   Physical Exam:  Well appearing elderly woman, NAD HEENT: Unremarkable Neck:  6 cm JVD, no thyromegally Lymphatics:  No adenopathy Back:  No CVA tenderness Lungs:  Clear with no wheezes HEART:  Regular rate rhythm, no murmurs, no rubs, no clicks Abd:  soft, positive bowel sounds, no organomegally, no rebound, no guarding Ext:  2 plus pulses, no edema, no cyanosis, no clubbing Skin:  No rashes no nodules Neuro:  CN II through XII intact, motor grossly intact  EKG - nsr with atrial pacing  DEVICE  Normal device function.  See PaceArt for details.   Assess/Plan: 1. Sinus node dysfunction - she is asymptomatic, s/p PPM insertion.  2. PPM - her [place] [employer] DDD PPM is working normally and she has about 3 years of battery longevity.  3. PAF - she maintaining NSR about 99% of the time.   Mikle Bosworth.D.

## 2019-01-28 NOTE — Patient Instructions (Signed)

## 2019-02-01 DIAGNOSIS — Z20828 Contact with and (suspected) exposure to other viral communicable diseases: Secondary | ICD-10-CM | POA: Diagnosis not present

## 2019-02-01 DIAGNOSIS — Z1159 Encounter for screening for other viral diseases: Secondary | ICD-10-CM | POA: Diagnosis not present

## 2019-02-08 DIAGNOSIS — Z20828 Contact with and (suspected) exposure to other viral communicable diseases: Secondary | ICD-10-CM | POA: Diagnosis not present

## 2019-02-08 DIAGNOSIS — Z1159 Encounter for screening for other viral diseases: Secondary | ICD-10-CM | POA: Diagnosis not present

## 2019-02-15 ENCOUNTER — Ambulatory Visit: Payer: Medicare PPO | Admitting: Internal Medicine

## 2019-02-15 ENCOUNTER — Other Ambulatory Visit: Payer: Self-pay

## 2019-02-15 ENCOUNTER — Encounter: Payer: Self-pay | Admitting: Internal Medicine

## 2019-02-15 VITALS — BP 127/72 | HR 70 | Ht 64.5 in | Wt 183.0 lb

## 2019-02-15 DIAGNOSIS — I480 Paroxysmal atrial fibrillation: Secondary | ICD-10-CM

## 2019-02-15 DIAGNOSIS — Z20828 Contact with and (suspected) exposure to other viral communicable diseases: Secondary | ICD-10-CM | POA: Diagnosis not present

## 2019-02-15 DIAGNOSIS — Z950 Presence of cardiac pacemaker: Secondary | ICD-10-CM

## 2019-02-15 DIAGNOSIS — I428 Other cardiomyopathies: Secondary | ICD-10-CM | POA: Diagnosis not present

## 2019-02-15 DIAGNOSIS — Z1159 Encounter for screening for other viral diseases: Secondary | ICD-10-CM | POA: Diagnosis not present

## 2019-02-15 NOTE — Patient Instructions (Signed)
Medication Instructions:  Your physician recommends that you continue on your current medications as directed. Please refer to the Current Medication list given to you today.  *If you need a refill on your cardiac medications before your next appointment, please call your pharmacy*   Follow-Up: At [facility], you and your health needs are our priority.  As part of our continuing mission to provide you with exceptional heart care, we have created designated Provider Care Teams.  These Care Teams include your primary Cardiologist (physician) and Advanced Practice Providers (APPs -  Physician Assistants and Nurse Practitioners) who all work together to provide you with the care you need, when you need it.  Your next appointment:   6 months  The format for your next appointment:   In Person  Provider:   You may see Dr. Hilty or one of the following Advanced Practice Providers on your designated Care Team:    Hao Meng, PA-C  Angela Duke, PA-C or   Krista Kroeger, PA-C   Other Instructions   

## 2019-02-15 NOTE — Progress Notes (Signed)
OFFICE NOTE  Chief Complaint:  Pain, shortness of breath  Primary Care Physician: Deborah Grandchild, MD  HPI:  Deborah Chung is a pleasant 86 year old female who recently moved to [place] from [place]. She was apparently living in [place], somewhere outside of [place]. Her son is Deborah Chung, who is the in-house counsel for the [employer] [facility] [employer] [employer] [employer] [facility] system. She was previously seeing [facility] and had an episode in February of 2015. Apparently she had a mobile health screening and was found to be out of rhythm. She was sent to her primary care doctor who did an EKG and noted she was in A. fib and was sent immediately to the hospital. As the story goes, she was on several treatments for her A. fib and apparently eventually underwent cardiac catheterization and/or possibly an ablation procedure for which she developed asystole or some type of arrhythmia requiring her to be resuscitated. Obviously this was successful and she received apparently a pacemaker. She is not clear as to what the type of pacemaker wasn't did not provide documentation today. I've not yet received any records from her cardiologist in [place]. She was placed on Eliquis for anticoagulation. In addition, it appears she is on digoxin and Aldactone along with Lopressor which is suggestive of possible cardiomyopathy. She apparently does have a heart failure diagnosis however I'm not certain as to what her ejection fraction is. She reports a remote check device for her pacemaker and did a remote check apparently last week which went to [place]. Symptomatically she denies any chest pain or shortness of breath.  I saw Mrs. Spiker back today in the office. She is doing fairly well. She saw Dr. Lewayne Bunting in November who performed a pacemaker check and felt that she was doing well in sinus rhythm. There is no evidence for recurrent atrial fibrillation. She has yet to be enrolled in home monitoring.  Twice she has requested a home monitor but it has not been yet sent to her. I will need to look into why she has not received her home monitoring device. 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 Eyes a chest pain or worsening shortness of breath. As mentioned recent laboratory work from her primary shows excellent cholesterol control with total cholesterol 174, triglycerides 124, HDL 59 and LDL of 90.  At the pleasure seeing Deborah Chung back in the office today. She reports doing fairly well. She still has some issues with swelling in her legs and discomfort with achiness and heaviness. She's had bilateral knee surgeries and is noted to have some varicose veins as well as some discoloration over her ankles. From a cardiac standpoint she had an echo which shows preserved systolic function, this represents a marked improvement since her EF was reported around 10% at one point but this may been after cardiac arrest when she got her pacemaker placed. Pacemaker function appears to be normal. She's had little if any atrial fibrillation and is on Eliquis for a CHADSVASC score of 4.  I saw Deborah Chung back today in the office. Overall she is feeling well. She denies any worsening shortness of breath or swelling. EF is now normalized. She follows with Dr. Ladona Ridgel for pacemaker. She's had no bleeding problems on Eliquis. She is on low-dose digoxin which will require monitoring. She is overdue for cholesterol check.  08/21/2015  Deborah Chung returns today for follow-up. In the interim she had a pacer remote check with a stable burden of atrial fibrillation.  She continues to do well on Eliquis. We discontinued her digoxin and her last office visit for a borderline elevated digoxin level and the fact that it is really not indicated for A. fib and her congestive heart failure has improved with normal LV function. Blood pressure is well-controlled today. Her only other complaint is that she's had diarrhea for  approximately 7 days. There is no fever or chills associated with that. He came on abruptly. She denied any sick contacts. She is using Imodium for this.  03/05/2016  Deborah Chung returns today for follow-up. Overall she feels well. She had recent adjustment in her pacemaker when she saw Dr. Ladona Ridgel and is atrial paced today is 70. Blood pressure is normal 118/62. She denies any bleeding problems on L a course. She is not aware of her A. fib. Recently she had a bout with diarrhea was found to have a pancreatic insufficiency. She is on Creon.  09/03/2078  Deborah Chung was seen today in follow-up. Her main complaint is bilateral knee pain. She has a history of knee replacement the past but her surgeon now lives in [place]. She's hesitant to do anything different about it. She's not as active as she's been in the past. She continues to have some problems with pancreatic insufficiency and pain associated with that. She denies any chest pain or worsening shortness of breath. Her pacemaker remote checks of been stable. The last was in May which showed a very low burden of A. fib of only 2%.  09/10/2017  Deborah Chung is seen today in follow-up.  Overall she has no significant complaints.  She has had a well-functioning pacemaker which has been followed by Dr. Ladona Ridgel.  Remote pacer checks have shown normal findings.  She still struggles with some GI issues and is followed by Dr. Christella Hartigan.  Blood pressure is well controlled today 110/60.  EKG shows an atrial paced rhythm.  She denies any symptomatic atrial fibrillation.  She has had no bleeding problems on Eliquis and denies any frequent falls or head injuries.  03/14/2018  Deborah Chung is seen today for follow-up.  Recently she has had remote pacer checks which continues showed normal findings.  Her blood pressure is well controlled today.  Her next pacer check is in May.  She had recent labs which showed an LDL of 80, which is reasonable control.  With regards to AF  she is on Eliquis 2.5 mg twice daily.  She is also on low-dose aspirin.  We discussed this a little further and I feel that there is no additional indication for that.  She takes pravastatin 20 mg daily.  02/15/2019  Mrs. Hagmann returns for follow-up.  She is accompanied by her daughter-in-law.  She is reporting some left ankle pain and difficulty walking.  She is noted to have somewhat of a shuffling gait and uses a walker.  According to her daughter-in-law she has had some more shortness of breath.  She has had several falls including one last month.  There was one fall that caused some bruising over her head and she had a CT scan which was negative for intracranial bleed since she is on Eliquis.  Her hearing is quite reduced.  Blood pressure is normal today.  Her recent remote check showed normal pacemaker function with a low burden of A. fib of about 3%.  She has a remote history of cardiomyopathy however EF was normal as of last echo.  PMHx:  Past Medical History:  Diagnosis Date  .  Anxiety   . Atrial fibrillation ([place])   . Blood transfusion without reported diagnosis   . GAD (generalized anxiety disorder)   . Hearing loss   . Hyperlipidemia   . Hypertension   . PHN (postherpetic neuralgia) 2010  . Systolic CHF [facility])     Past Surgical History:  Procedure Laterality Date  . ABDOMINAL HYSTERECTOMY    . PACEMAKER PLACEMENT    . TOTAL KNEE ARTHROPLASTY Bilateral     FAMHx:  Family History  Problem Relation Age of Onset  . Heart disease Father   . Heart disease Sister     SOCHx:   reports that she quit smoking about 30 years ago. She quit after 25.00 years of use. She has never used smokeless tobacco. She reports previous alcohol use. She reports that she does not use drugs.  ALLERGIES:  No Known Allergies  ROS: Pertinent items noted in HPI and remainder of comprehensive ROS otherwise negative.  HOME MEDS: Current Outpatient Medications  Medication Sig Dispense Refill  .  ALPRAZolam (XANAX) 0.25 MG tablet TAKE 1 TABLET BY MOUTH AS NEEDED FOR ANXIETY 30 tablet 5  . aspirin 81 MG tablet Take 81 mg by mouth daily.    Marland Kitchen BIOTIN PO Take by mouth daily.    . diphenoxylate-atropine (LOMOTIL) 2.5-0.025 MG tablet Take 1 tablet by mouth every 6 (six) hours as needed for diarrhea or loose stools. 90 tablet 2  . diphenoxylate-atropine (LOMOTIL) 2.5-0.025 MG tablet Take 1 tablet by mouth 2 (two) times daily. 60 tablet 11  . DULoxetine (CYMBALTA) 60 MG capsule Take 1 capsule (60 mg total) by mouth daily. 90 capsule 1  . ELIQUIS 2.5 MG TABS tablet TAKE 1 TABLET BY MOUTH TWICE A DAY 180 tablet 1  . famotidine (PEPCID) 20 MG tablet Take 1 tablet (20 mg total) by mouth daily. Discontinue dexilant 90 tablet 1  . lipase/protease/amylase (CREON) 36000 UNITS CPEP capsule Take 2 capsules with each meal and 1 capsule with each snack 720 capsule 1  . metoprolol tartrate (LOPRESSOR) 25 MG tablet TAKE 1 TABLET BY MOUTH TWICE A DAY 180 tablet 1  . Omega-3 Fatty Acids (OMEGA-3 FISH OIL PO) Take by mouth.    . pravastatin (PRAVACHOL) 20 MG tablet TAKE 1 TABLET BY MOUTH EVERY DAY 90 tablet 1  . pregabalin (LYRICA) 75 MG capsule Take 1 capsule (75 mg total) by mouth 3 (three) times daily. 270 capsule 1  . Probiotic Product (PROBIOTIC PO) Take 1 tablet by mouth daily.    Marland Kitchen spironolactone (ALDACTONE) 25 MG tablet TAKE 1 TABLET BY MOUTH EVERY DAY 90 tablet 1  . traZODone (DESYREL) 50 MG tablet TAKE 1 TABLET (50 MG TOTAL) BY MOUTH AT BEDTIME AS NEEDED FOR SLEEP. 90 tablet 1   No current facility-administered medications for this visit.    LABS/IMAGING: No results found for this or any previous visit (from the past 48 hour(s)). No results found.  VITALS: BP 127/72   Pulse 70   Ht 5' 4.5" (1.638 m)   Wt 183 lb (83 kg)   SpO2 97%   BMI 30.93 kg/m   EXAM: General appearance: alert and no distress Neck: no carotid bruit and no JVD Lungs: clear to auscultation bilaterally and pacemaker in  well-healed pocket in the left upper chest Heart: regular rate and rhythm, S1, S2 normal, no murmur, click, rub or gallop Abdomen: soft, non-tender; bowel sounds normal; no masses,  no organomegaly Extremities: extremities normal, atraumatic, no cyanosis or edema, varicose veins noted  and Shiny skin without pitting edema Pulses: 2+ and symmetric Skin: Skin color, texture, turgor normal. No rashes or lesions Neurologic: Grossly normal Psych: NOrmal  EKG: Atrial paced rhythm at 70-personally reviewed  ASSESSMENT: 1. History of atrial fibrillation on anticoagulation with Eliquis (CHADVASC 4) 2. Hypertension 3. Dyslipidemia 4. Status post pacemaker - for sinus node dysfunction and complete heart block 5. Reported history of EF of 10-15%, now improved to 55-60% (2016)  PLAN: 1.  Mrs. Paulick has a low burden of AF about 3%.  This seems to be stable on remote checks.  Recently over the past few months she has been more short of breath with exertion according to her daughter-in-law.  Possibilities include recurrent cardiomyopathy, anemia, some diastolic heart failure, etc.  I offered to repeat an echo and perform some lab work however Mrs. Bowmer did not seem to be in favor that.  After discussion with her and her daughter they wanted to defer this to follow-up with her primary if it continues.  I am concerned about the falls and will need to consider long-term whether or not if she continues to fall it safe to be on Eliquis.  Happy to see her back sooner if she wants to do additional testing otherwise 6 months.  Chrystie Nose, MD, [facility], FACP  [place]  [facility]  Medical Director of the Advanced Lipid Disorders &  Cardiovascular Risk Reduction Clinic Diplomate of the American Board of Clinical Lipidology Attending Cardiologist  Direct Dial: [phone]  Fax: [phone]  Website:  [email or web address] Nicely Amoura Ransier 02/15/2019, 11:16 AM

## 2019-02-22 DIAGNOSIS — Z20828 Contact with and (suspected) exposure to other viral communicable diseases: Secondary | ICD-10-CM | POA: Diagnosis not present

## 2019-02-22 DIAGNOSIS — Z1159 Encounter for screening for other viral diseases: Secondary | ICD-10-CM | POA: Diagnosis not present

## 2019-02-24 ENCOUNTER — Ambulatory Visit (INDEPENDENT_AMBULATORY_CARE_PROVIDER_SITE_OTHER): Payer: Medicare PPO | Admitting: *Deleted

## 2019-02-24 DIAGNOSIS — I480 Paroxysmal atrial fibrillation: Secondary | ICD-10-CM | POA: Diagnosis not present

## 2019-02-24 LAB — CUP PACEART REMOTE DEVICE CHECK
Battery Remaining Longevity: 36 mo
Battery Remaining Percentage: 55 %
Brady Statistic RA Percent Paced: 94 %
Brady Statistic RV Percent Paced: 1 %
Date Time Interrogation Session: 20210210040300
Implantable Lead Implant Date: 20150112
Implantable Lead Implant Date: 20150112
Implantable Lead Location: 753859
Implantable Lead Location: 753860
Implantable Lead Model: 4135
Implantable Lead Model: 4136
Implantable Lead Serial Number: 29308444
Implantable Lead Serial Number: 29569556
Implantable Pulse Generator Implant Date: 20150112
Lead Channel Impedance Value: 628 Ohm
Lead Channel Impedance Value: 868 Ohm
Lead Channel Pacing Threshold Amplitude: 0.7 V
Lead Channel Pacing Threshold Amplitude: 0.8 V
Lead Channel Pacing Threshold Pulse Width: 0.4 ms
Lead Channel Pacing Threshold Pulse Width: 0.5 ms
Lead Channel Setting Pacing Amplitude: 1.1 V
Lead Channel Setting Pacing Amplitude: 2.4 V
Lead Channel Setting Pacing Pulse Width: 0.4 ms
Lead Channel Setting Sensing Sensitivity: 3 mV
Pulse Gen Serial Number: 391753

## 2019-02-24 NOTE — Progress Notes (Signed)
PPM Remote  

## 2019-03-01 DIAGNOSIS — Z20828 Contact with and (suspected) exposure to other viral communicable diseases: Secondary | ICD-10-CM | POA: Diagnosis not present

## 2019-03-01 DIAGNOSIS — Z1159 Encounter for screening for other viral diseases: Secondary | ICD-10-CM | POA: Diagnosis not present

## 2019-03-08 DIAGNOSIS — Z1159 Encounter for screening for other viral diseases: Secondary | ICD-10-CM | POA: Diagnosis not present

## 2019-03-08 DIAGNOSIS — Z20828 Contact with and (suspected) exposure to other viral communicable diseases: Secondary | ICD-10-CM | POA: Diagnosis not present

## 2019-03-12 DIAGNOSIS — R262 Difficulty in walking, not elsewhere classified: Secondary | ICD-10-CM | POA: Diagnosis not present

## 2019-03-12 DIAGNOSIS — R2681 Unsteadiness on feet: Secondary | ICD-10-CM | POA: Diagnosis not present

## 2019-03-12 DIAGNOSIS — M25572 Pain in left ankle and joints of left foot: Secondary | ICD-10-CM | POA: Diagnosis not present

## 2019-03-12 DIAGNOSIS — R296 Repeated falls: Secondary | ICD-10-CM | POA: Diagnosis not present

## 2019-03-15 DIAGNOSIS — R278 Other lack of coordination: Secondary | ICD-10-CM | POA: Diagnosis not present

## 2019-03-15 DIAGNOSIS — M25572 Pain in left ankle and joints of left foot: Secondary | ICD-10-CM | POA: Diagnosis not present

## 2019-03-15 DIAGNOSIS — Z20828 Contact with and (suspected) exposure to other viral communicable diseases: Secondary | ICD-10-CM | POA: Diagnosis not present

## 2019-03-15 DIAGNOSIS — R262 Difficulty in walking, not elsewhere classified: Secondary | ICD-10-CM | POA: Diagnosis not present

## 2019-03-15 DIAGNOSIS — M6281 Muscle weakness (generalized): Secondary | ICD-10-CM | POA: Diagnosis not present

## 2019-03-15 DIAGNOSIS — R2681 Unsteadiness on feet: Secondary | ICD-10-CM | POA: Diagnosis not present

## 2019-03-15 DIAGNOSIS — Z1159 Encounter for screening for other viral diseases: Secondary | ICD-10-CM | POA: Diagnosis not present

## 2019-03-15 DIAGNOSIS — Z9181 History of falling: Secondary | ICD-10-CM | POA: Diagnosis not present

## 2019-03-15 DIAGNOSIS — R296 Repeated falls: Secondary | ICD-10-CM | POA: Diagnosis not present

## 2019-03-16 DIAGNOSIS — M25572 Pain in left ankle and joints of left foot: Secondary | ICD-10-CM | POA: Diagnosis not present

## 2019-03-16 DIAGNOSIS — R296 Repeated falls: Secondary | ICD-10-CM | POA: Diagnosis not present

## 2019-03-16 DIAGNOSIS — M6281 Muscle weakness (generalized): Secondary | ICD-10-CM | POA: Diagnosis not present

## 2019-03-16 DIAGNOSIS — R278 Other lack of coordination: Secondary | ICD-10-CM | POA: Diagnosis not present

## 2019-03-16 DIAGNOSIS — R262 Difficulty in walking, not elsewhere classified: Secondary | ICD-10-CM | POA: Diagnosis not present

## 2019-03-16 DIAGNOSIS — Z9181 History of falling: Secondary | ICD-10-CM | POA: Diagnosis not present

## 2019-03-16 DIAGNOSIS — R2681 Unsteadiness on feet: Secondary | ICD-10-CM | POA: Diagnosis not present

## 2019-03-17 DIAGNOSIS — R2681 Unsteadiness on feet: Secondary | ICD-10-CM | POA: Diagnosis not present

## 2019-03-17 DIAGNOSIS — R262 Difficulty in walking, not elsewhere classified: Secondary | ICD-10-CM | POA: Diagnosis not present

## 2019-03-17 DIAGNOSIS — R278 Other lack of coordination: Secondary | ICD-10-CM | POA: Diagnosis not present

## 2019-03-17 DIAGNOSIS — M6281 Muscle weakness (generalized): Secondary | ICD-10-CM | POA: Diagnosis not present

## 2019-03-17 DIAGNOSIS — M25572 Pain in left ankle and joints of left foot: Secondary | ICD-10-CM | POA: Diagnosis not present

## 2019-03-17 DIAGNOSIS — Z9181 History of falling: Secondary | ICD-10-CM | POA: Diagnosis not present

## 2019-03-17 DIAGNOSIS — R296 Repeated falls: Secondary | ICD-10-CM | POA: Diagnosis not present

## 2019-03-22 DIAGNOSIS — Z1159 Encounter for screening for other viral diseases: Secondary | ICD-10-CM | POA: Diagnosis not present

## 2019-03-22 DIAGNOSIS — Z20828 Contact with and (suspected) exposure to other viral communicable diseases: Secondary | ICD-10-CM | POA: Diagnosis not present

## 2019-03-23 DIAGNOSIS — R2681 Unsteadiness on feet: Secondary | ICD-10-CM | POA: Diagnosis not present

## 2019-03-23 DIAGNOSIS — R278 Other lack of coordination: Secondary | ICD-10-CM | POA: Diagnosis not present

## 2019-03-23 DIAGNOSIS — R296 Repeated falls: Secondary | ICD-10-CM | POA: Diagnosis not present

## 2019-03-23 DIAGNOSIS — R262 Difficulty in walking, not elsewhere classified: Secondary | ICD-10-CM | POA: Diagnosis not present

## 2019-03-23 DIAGNOSIS — Z9181 History of falling: Secondary | ICD-10-CM | POA: Diagnosis not present

## 2019-03-23 DIAGNOSIS — M25572 Pain in left ankle and joints of left foot: Secondary | ICD-10-CM | POA: Diagnosis not present

## 2019-03-23 DIAGNOSIS — M6281 Muscle weakness (generalized): Secondary | ICD-10-CM | POA: Diagnosis not present

## 2019-03-24 DIAGNOSIS — M25572 Pain in left ankle and joints of left foot: Secondary | ICD-10-CM | POA: Diagnosis not present

## 2019-03-24 DIAGNOSIS — R296 Repeated falls: Secondary | ICD-10-CM | POA: Diagnosis not present

## 2019-03-24 DIAGNOSIS — R2681 Unsteadiness on feet: Secondary | ICD-10-CM | POA: Diagnosis not present

## 2019-03-24 DIAGNOSIS — R278 Other lack of coordination: Secondary | ICD-10-CM | POA: Diagnosis not present

## 2019-03-24 DIAGNOSIS — M6281 Muscle weakness (generalized): Secondary | ICD-10-CM | POA: Diagnosis not present

## 2019-03-24 DIAGNOSIS — Z9181 History of falling: Secondary | ICD-10-CM | POA: Diagnosis not present

## 2019-03-24 DIAGNOSIS — R262 Difficulty in walking, not elsewhere classified: Secondary | ICD-10-CM | POA: Diagnosis not present

## 2019-03-25 DIAGNOSIS — M25572 Pain in left ankle and joints of left foot: Secondary | ICD-10-CM | POA: Diagnosis not present

## 2019-03-25 DIAGNOSIS — M6281 Muscle weakness (generalized): Secondary | ICD-10-CM | POA: Diagnosis not present

## 2019-03-25 DIAGNOSIS — R296 Repeated falls: Secondary | ICD-10-CM | POA: Diagnosis not present

## 2019-03-25 DIAGNOSIS — R2681 Unsteadiness on feet: Secondary | ICD-10-CM | POA: Diagnosis not present

## 2019-03-25 DIAGNOSIS — Z9181 History of falling: Secondary | ICD-10-CM | POA: Diagnosis not present

## 2019-03-25 DIAGNOSIS — R262 Difficulty in walking, not elsewhere classified: Secondary | ICD-10-CM | POA: Diagnosis not present

## 2019-03-25 DIAGNOSIS — R278 Other lack of coordination: Secondary | ICD-10-CM | POA: Diagnosis not present

## 2019-03-26 DIAGNOSIS — R2681 Unsteadiness on feet: Secondary | ICD-10-CM | POA: Diagnosis not present

## 2019-03-26 DIAGNOSIS — R278 Other lack of coordination: Secondary | ICD-10-CM | POA: Diagnosis not present

## 2019-03-26 DIAGNOSIS — M25572 Pain in left ankle and joints of left foot: Secondary | ICD-10-CM | POA: Diagnosis not present

## 2019-03-26 DIAGNOSIS — Z9181 History of falling: Secondary | ICD-10-CM | POA: Diagnosis not present

## 2019-03-26 DIAGNOSIS — M6281 Muscle weakness (generalized): Secondary | ICD-10-CM | POA: Diagnosis not present

## 2019-03-26 DIAGNOSIS — R262 Difficulty in walking, not elsewhere classified: Secondary | ICD-10-CM | POA: Diagnosis not present

## 2019-03-26 DIAGNOSIS — R296 Repeated falls: Secondary | ICD-10-CM | POA: Diagnosis not present

## 2019-03-29 DIAGNOSIS — Z1159 Encounter for screening for other viral diseases: Secondary | ICD-10-CM | POA: Diagnosis not present

## 2019-03-29 DIAGNOSIS — R296 Repeated falls: Secondary | ICD-10-CM | POA: Diagnosis not present

## 2019-03-29 DIAGNOSIS — Z9181 History of falling: Secondary | ICD-10-CM | POA: Diagnosis not present

## 2019-03-29 DIAGNOSIS — Z20828 Contact with and (suspected) exposure to other viral communicable diseases: Secondary | ICD-10-CM | POA: Diagnosis not present

## 2019-03-29 DIAGNOSIS — R278 Other lack of coordination: Secondary | ICD-10-CM | POA: Diagnosis not present

## 2019-03-29 DIAGNOSIS — M6281 Muscle weakness (generalized): Secondary | ICD-10-CM | POA: Diagnosis not present

## 2019-03-29 DIAGNOSIS — R262 Difficulty in walking, not elsewhere classified: Secondary | ICD-10-CM | POA: Diagnosis not present

## 2019-03-29 DIAGNOSIS — M25572 Pain in left ankle and joints of left foot: Secondary | ICD-10-CM | POA: Diagnosis not present

## 2019-03-29 DIAGNOSIS — R2681 Unsteadiness on feet: Secondary | ICD-10-CM | POA: Diagnosis not present

## 2019-03-30 DIAGNOSIS — R278 Other lack of coordination: Secondary | ICD-10-CM | POA: Diagnosis not present

## 2019-03-30 DIAGNOSIS — R296 Repeated falls: Secondary | ICD-10-CM | POA: Diagnosis not present

## 2019-03-30 DIAGNOSIS — M25572 Pain in left ankle and joints of left foot: Secondary | ICD-10-CM | POA: Diagnosis not present

## 2019-03-30 DIAGNOSIS — R2681 Unsteadiness on feet: Secondary | ICD-10-CM | POA: Diagnosis not present

## 2019-03-30 DIAGNOSIS — R262 Difficulty in walking, not elsewhere classified: Secondary | ICD-10-CM | POA: Diagnosis not present

## 2019-03-30 DIAGNOSIS — M6281 Muscle weakness (generalized): Secondary | ICD-10-CM | POA: Diagnosis not present

## 2019-03-30 DIAGNOSIS — Z9181 History of falling: Secondary | ICD-10-CM | POA: Diagnosis not present

## 2019-04-01 ENCOUNTER — Telehealth: Payer: Self-pay | Admitting: Internal Medicine

## 2019-04-01 DIAGNOSIS — M6281 Muscle weakness (generalized): Secondary | ICD-10-CM | POA: Diagnosis not present

## 2019-04-01 DIAGNOSIS — R262 Difficulty in walking, not elsewhere classified: Secondary | ICD-10-CM | POA: Diagnosis not present

## 2019-04-01 DIAGNOSIS — Z0279 Encounter for issue of other medical certificate: Secondary | ICD-10-CM

## 2019-04-01 DIAGNOSIS — R2681 Unsteadiness on feet: Secondary | ICD-10-CM | POA: Diagnosis not present

## 2019-04-01 DIAGNOSIS — M25572 Pain in left ankle and joints of left foot: Secondary | ICD-10-CM | POA: Diagnosis not present

## 2019-04-01 DIAGNOSIS — Z9181 History of falling: Secondary | ICD-10-CM | POA: Diagnosis not present

## 2019-04-01 DIAGNOSIS — R278 Other lack of coordination: Secondary | ICD-10-CM | POA: Diagnosis not present

## 2019-04-01 DIAGNOSIS — R296 Repeated falls: Secondary | ICD-10-CM | POA: Diagnosis not present

## 2019-04-01 NOTE — Telephone Encounter (Signed)
Fax received, completed, signed and faxed as requested.

## 2019-04-01 NOTE — Telephone Encounter (Signed)
New message:    Deborah Chung is calling and would like to know if we received the forms or this patient. They are FL2 forms and she stated the patient is supposed to be moving in today but can't if they don't have these forms first. Please advise.

## 2019-04-02 ENCOUNTER — Telehealth: Payer: Self-pay | Admitting: Internal Medicine

## 2019-04-02 NOTE — Telephone Encounter (Signed)
New message:   [facility] called to let us know there is two things on the [facility] form that need to be changed for this patient. The form was faxed while on the phone with me and would appreciate it if the form can be sent back asap.

## 2019-04-03 NOTE — Telephone Encounter (Signed)
The form has been completed and faxed back.

## 2019-04-05 DIAGNOSIS — R278 Other lack of coordination: Secondary | ICD-10-CM | POA: Diagnosis not present

## 2019-04-05 DIAGNOSIS — Z1159 Encounter for screening for other viral diseases: Secondary | ICD-10-CM | POA: Diagnosis not present

## 2019-04-05 DIAGNOSIS — M6281 Muscle weakness (generalized): Secondary | ICD-10-CM | POA: Diagnosis not present

## 2019-04-05 DIAGNOSIS — R296 Repeated falls: Secondary | ICD-10-CM | POA: Diagnosis not present

## 2019-04-05 DIAGNOSIS — R2681 Unsteadiness on feet: Secondary | ICD-10-CM | POA: Diagnosis not present

## 2019-04-05 DIAGNOSIS — R262 Difficulty in walking, not elsewhere classified: Secondary | ICD-10-CM | POA: Diagnosis not present

## 2019-04-05 DIAGNOSIS — Z20828 Contact with and (suspected) exposure to other viral communicable diseases: Secondary | ICD-10-CM | POA: Diagnosis not present

## 2019-04-05 DIAGNOSIS — Z9181 History of falling: Secondary | ICD-10-CM | POA: Diagnosis not present

## 2019-04-05 DIAGNOSIS — M25572 Pain in left ankle and joints of left foot: Secondary | ICD-10-CM | POA: Diagnosis not present

## 2019-04-06 DIAGNOSIS — M6281 Muscle weakness (generalized): Secondary | ICD-10-CM | POA: Diagnosis not present

## 2019-04-06 DIAGNOSIS — R278 Other lack of coordination: Secondary | ICD-10-CM | POA: Diagnosis not present

## 2019-04-06 DIAGNOSIS — R2681 Unsteadiness on feet: Secondary | ICD-10-CM | POA: Diagnosis not present

## 2019-04-06 DIAGNOSIS — Z9181 History of falling: Secondary | ICD-10-CM | POA: Diagnosis not present

## 2019-04-06 DIAGNOSIS — R262 Difficulty in walking, not elsewhere classified: Secondary | ICD-10-CM | POA: Diagnosis not present

## 2019-04-06 DIAGNOSIS — M25572 Pain in left ankle and joints of left foot: Secondary | ICD-10-CM | POA: Diagnosis not present

## 2019-04-06 DIAGNOSIS — R296 Repeated falls: Secondary | ICD-10-CM | POA: Diagnosis not present

## 2019-04-08 ENCOUNTER — Telehealth: Payer: Self-pay | Admitting: Internal Medicine

## 2019-04-08 DIAGNOSIS — R278 Other lack of coordination: Secondary | ICD-10-CM | POA: Diagnosis not present

## 2019-04-08 DIAGNOSIS — Z9181 History of falling: Secondary | ICD-10-CM | POA: Diagnosis not present

## 2019-04-08 DIAGNOSIS — R2681 Unsteadiness on feet: Secondary | ICD-10-CM | POA: Diagnosis not present

## 2019-04-08 DIAGNOSIS — Z1159 Encounter for screening for other viral diseases: Secondary | ICD-10-CM | POA: Diagnosis not present

## 2019-04-08 DIAGNOSIS — R296 Repeated falls: Secondary | ICD-10-CM | POA: Diagnosis not present

## 2019-04-08 DIAGNOSIS — M25572 Pain in left ankle and joints of left foot: Secondary | ICD-10-CM | POA: Diagnosis not present

## 2019-04-08 DIAGNOSIS — Z20828 Contact with and (suspected) exposure to other viral communicable diseases: Secondary | ICD-10-CM | POA: Diagnosis not present

## 2019-04-08 DIAGNOSIS — M6281 Muscle weakness (generalized): Secondary | ICD-10-CM | POA: Diagnosis not present

## 2019-04-08 DIAGNOSIS — R262 Difficulty in walking, not elsewhere classified: Secondary | ICD-10-CM | POA: Diagnosis not present

## 2019-04-08 NOTE — Telephone Encounter (Signed)
   [facility] calling to report patient has nausea today. Requesting med order  Call [phone]

## 2019-04-09 DIAGNOSIS — R2681 Unsteadiness on feet: Secondary | ICD-10-CM | POA: Diagnosis not present

## 2019-04-09 DIAGNOSIS — R262 Difficulty in walking, not elsewhere classified: Secondary | ICD-10-CM | POA: Diagnosis not present

## 2019-04-09 DIAGNOSIS — M25572 Pain in left ankle and joints of left foot: Secondary | ICD-10-CM | POA: Diagnosis not present

## 2019-04-09 DIAGNOSIS — M6281 Muscle weakness (generalized): Secondary | ICD-10-CM | POA: Diagnosis not present

## 2019-04-09 DIAGNOSIS — R278 Other lack of coordination: Secondary | ICD-10-CM | POA: Diagnosis not present

## 2019-04-09 DIAGNOSIS — R296 Repeated falls: Secondary | ICD-10-CM | POA: Diagnosis not present

## 2019-04-09 DIAGNOSIS — Z9181 History of falling: Secondary | ICD-10-CM | POA: Diagnosis not present

## 2019-04-09 NOTE — Telephone Encounter (Signed)
Patient's son and POA, [name], calling and is requesting the nausea meds and a prescription for xanax PRN. States that the patient has been on them before for anxiety. States that she is experiencing both of these issues. CB#: [phone]

## 2019-04-09 NOTE — Telephone Encounter (Signed)
Orders faxed as requested. Robert informed of same.

## 2019-04-12 DIAGNOSIS — R278 Other lack of coordination: Secondary | ICD-10-CM | POA: Diagnosis not present

## 2019-04-12 DIAGNOSIS — R296 Repeated falls: Secondary | ICD-10-CM | POA: Diagnosis not present

## 2019-04-12 DIAGNOSIS — Z20828 Contact with and (suspected) exposure to other viral communicable diseases: Secondary | ICD-10-CM | POA: Diagnosis not present

## 2019-04-12 DIAGNOSIS — R2681 Unsteadiness on feet: Secondary | ICD-10-CM | POA: Diagnosis not present

## 2019-04-12 DIAGNOSIS — M6281 Muscle weakness (generalized): Secondary | ICD-10-CM | POA: Diagnosis not present

## 2019-04-12 DIAGNOSIS — Z9181 History of falling: Secondary | ICD-10-CM | POA: Diagnosis not present

## 2019-04-12 DIAGNOSIS — Z1159 Encounter for screening for other viral diseases: Secondary | ICD-10-CM | POA: Diagnosis not present

## 2019-04-12 DIAGNOSIS — M25572 Pain in left ankle and joints of left foot: Secondary | ICD-10-CM | POA: Diagnosis not present

## 2019-04-12 DIAGNOSIS — R262 Difficulty in walking, not elsewhere classified: Secondary | ICD-10-CM | POA: Diagnosis not present

## 2019-04-13 DIAGNOSIS — M25572 Pain in left ankle and joints of left foot: Secondary | ICD-10-CM | POA: Diagnosis not present

## 2019-04-13 DIAGNOSIS — Z9181 History of falling: Secondary | ICD-10-CM | POA: Diagnosis not present

## 2019-04-13 DIAGNOSIS — M6281 Muscle weakness (generalized): Secondary | ICD-10-CM | POA: Diagnosis not present

## 2019-04-13 DIAGNOSIS — R2681 Unsteadiness on feet: Secondary | ICD-10-CM | POA: Diagnosis not present

## 2019-04-13 DIAGNOSIS — R262 Difficulty in walking, not elsewhere classified: Secondary | ICD-10-CM | POA: Diagnosis not present

## 2019-04-13 DIAGNOSIS — R278 Other lack of coordination: Secondary | ICD-10-CM | POA: Diagnosis not present

## 2019-04-13 DIAGNOSIS — R296 Repeated falls: Secondary | ICD-10-CM | POA: Diagnosis not present

## 2019-04-14 DIAGNOSIS — M6281 Muscle weakness (generalized): Secondary | ICD-10-CM | POA: Diagnosis not present

## 2019-04-14 DIAGNOSIS — R2681 Unsteadiness on feet: Secondary | ICD-10-CM | POA: Diagnosis not present

## 2019-04-14 DIAGNOSIS — M25572 Pain in left ankle and joints of left foot: Secondary | ICD-10-CM | POA: Diagnosis not present

## 2019-04-14 DIAGNOSIS — R278 Other lack of coordination: Secondary | ICD-10-CM | POA: Diagnosis not present

## 2019-04-14 DIAGNOSIS — Z9181 History of falling: Secondary | ICD-10-CM | POA: Diagnosis not present

## 2019-04-14 DIAGNOSIS — R296 Repeated falls: Secondary | ICD-10-CM | POA: Diagnosis not present

## 2019-04-14 DIAGNOSIS — R262 Difficulty in walking, not elsewhere classified: Secondary | ICD-10-CM | POA: Diagnosis not present

## 2019-04-15 DIAGNOSIS — Z1159 Encounter for screening for other viral diseases: Secondary | ICD-10-CM | POA: Diagnosis not present

## 2019-04-15 DIAGNOSIS — Z20828 Contact with and (suspected) exposure to other viral communicable diseases: Secondary | ICD-10-CM | POA: Diagnosis not present

## 2019-04-19 DIAGNOSIS — Z20828 Contact with and (suspected) exposure to other viral communicable diseases: Secondary | ICD-10-CM | POA: Diagnosis not present

## 2019-04-19 DIAGNOSIS — Z1159 Encounter for screening for other viral diseases: Secondary | ICD-10-CM | POA: Diagnosis not present

## 2019-04-20 DIAGNOSIS — M6281 Muscle weakness (generalized): Secondary | ICD-10-CM | POA: Diagnosis not present

## 2019-04-20 DIAGNOSIS — Z9181 History of falling: Secondary | ICD-10-CM | POA: Diagnosis not present

## 2019-04-20 DIAGNOSIS — R41841 Cognitive communication deficit: Secondary | ICD-10-CM | POA: Diagnosis not present

## 2019-04-20 DIAGNOSIS — R278 Other lack of coordination: Secondary | ICD-10-CM | POA: Diagnosis not present

## 2019-04-20 DIAGNOSIS — R296 Repeated falls: Secondary | ICD-10-CM | POA: Diagnosis not present

## 2019-04-20 DIAGNOSIS — R262 Difficulty in walking, not elsewhere classified: Secondary | ICD-10-CM | POA: Diagnosis not present

## 2019-04-20 DIAGNOSIS — R2681 Unsteadiness on feet: Secondary | ICD-10-CM | POA: Diagnosis not present

## 2019-04-20 DIAGNOSIS — M25572 Pain in left ankle and joints of left foot: Secondary | ICD-10-CM | POA: Diagnosis not present

## 2019-04-21 ENCOUNTER — Other Ambulatory Visit: Payer: Self-pay

## 2019-04-21 ENCOUNTER — Encounter: Payer: Self-pay | Admitting: Internal Medicine

## 2019-04-21 ENCOUNTER — Ambulatory Visit: Payer: Medicare PPO | Admitting: Internal Medicine

## 2019-04-21 VITALS — BP 110/60 | HR 70 | Temp 97.9°F | Resp 16 | Ht 64.5 in | Wt 179.0 lb

## 2019-04-21 DIAGNOSIS — F0390 Unspecified dementia without behavioral disturbance: Secondary | ICD-10-CM

## 2019-04-21 DIAGNOSIS — I10 Essential (primary) hypertension: Secondary | ICD-10-CM | POA: Diagnosis not present

## 2019-04-21 DIAGNOSIS — I480 Paroxysmal atrial fibrillation: Secondary | ICD-10-CM | POA: Diagnosis not present

## 2019-04-21 DIAGNOSIS — F0392 Unspecified dementia, unspecified severity, with psychotic disturbance: Secondary | ICD-10-CM

## 2019-04-21 DIAGNOSIS — R443 Hallucinations, unspecified: Secondary | ICD-10-CM

## 2019-04-21 DIAGNOSIS — E781 Pure hyperglyceridemia: Secondary | ICD-10-CM

## 2019-04-21 DIAGNOSIS — B354 Tinea corporis: Secondary | ICD-10-CM | POA: Diagnosis not present

## 2019-04-21 DIAGNOSIS — E785 Hyperlipidemia, unspecified: Secondary | ICD-10-CM

## 2019-04-21 DIAGNOSIS — Z0000 Encounter for general adult medical examination without abnormal findings: Secondary | ICD-10-CM | POA: Diagnosis not present

## 2019-04-21 DIAGNOSIS — R739 Hyperglycemia, unspecified: Secondary | ICD-10-CM | POA: Diagnosis not present

## 2019-04-21 DIAGNOSIS — B0229 Other postherpetic nervous system involvement: Secondary | ICD-10-CM

## 2019-04-21 DIAGNOSIS — N1832 Chronic kidney disease, stage 3b: Secondary | ICD-10-CM

## 2019-04-21 LAB — BASIC METABOLIC PANEL
BUN: 19 mg/dL (ref 6–23)
CO2: 32 mEq/L (ref 19–32)
Calcium: 9.5 mg/dL (ref 8.4–10.5)
Chloride: 102 mEq/L (ref 96–112)
Creatinine, Ser: 1.12 mg/dL (ref 0.40–1.20)
GFR: 45.61 mL/min — ABNORMAL LOW (ref >60.00)
Glucose, Bld: 125 mg/dL — ABNORMAL HIGH (ref 70–99)
Potassium: 4.5 mEq/L (ref 3.5–5.1)
Sodium: 138 mEq/L (ref 135–145)

## 2019-04-21 LAB — CBC WITH DIFFERENTIAL/PLATELET
Basophils Absolute: 0 10*3/uL (ref 0.0–0.1)
Basophils Relative: 0.6 % (ref 0.0–3.0)
Eosinophils Absolute: 0.2 10*3/uL (ref 0.0–0.7)
Eosinophils Relative: 2.4 % (ref 0.0–5.0)
HCT: 44.8 % (ref 36.0–46.0)
Hemoglobin: 14.9 g/dL (ref 12.0–15.0)
Lymphocytes Relative: 25.1 % (ref 12.0–46.0)
Lymphs Abs: 1.9 10*3/uL (ref 0.7–4.0)
MCHC: 33.3 g/dL (ref 30.0–36.0)
MCV: 96 fl (ref 78.0–100.0)
Monocytes Absolute: 0.8 10*3/uL (ref 0.1–1.0)
Monocytes Relative: 10.3 % (ref 3.0–12.0)
Neutro Abs: 4.6 10*3/uL (ref 1.4–7.7)
Neutrophils Relative %: 61.6 % (ref 43.0–77.0)
Platelets: 220 10*3/uL (ref 150.0–400.0)
RBC: 4.67 Mil/uL (ref 3.87–5.11)
RDW: 14.1 % (ref 11.5–15.5)
WBC: 7.4 10*3/uL (ref 4.0–10.5)

## 2019-04-21 LAB — LIPID PANEL
Cholesterol: 192 mg/dL (ref 0–200)
HDL: 43.4 mg/dL (ref >39.00)
LDL Cholesterol: 109 mg/dL — ABNORMAL HIGH (ref 0–99)
NonHDL: 148.81
Total CHOL/HDL Ratio: 4
Triglycerides: 198 mg/dL — ABNORMAL HIGH (ref 0.0–149.0)
VLDL: 39.6 mg/dL (ref 0.0–40.0)

## 2019-04-21 LAB — HEPATIC FUNCTION PANEL
ALT: 11 U/L (ref 0–35)
AST: 14 U/L (ref 0–37)
Albumin: 4.2 g/dL (ref 3.5–5.2)
Alkaline Phosphatase: 131 U/L — ABNORMAL HIGH (ref 39–117)
Bilirubin, Direct: 0.1 mg/dL (ref 0.0–0.3)
Total Bilirubin: 0.5 mg/dL (ref 0.2–1.2)
Total Protein: 6.8 g/dL (ref 6.0–8.3)

## 2019-04-21 LAB — HEMOGLOBIN A1C: Hgb A1c MFr Bld: 5.6 % (ref 4.6–6.5)

## 2019-04-21 LAB — TSH: TSH: 1.45 u[IU]/mL (ref 0.35–4.50)

## 2019-04-21 MED ORDER — FLUCONAZOLE 100 MG PO TABS: 100.0000 mg | ORAL_TABLET | Freq: Every day | ORAL | 0 refills | Status: AC

## 2019-04-21 MED ORDER — PREGABALIN 75 MG PO CAPS: 75.0000 mg | ORAL_CAPSULE | Freq: Three times a day (TID) | ORAL | 1 refills | Status: DC

## 2019-04-21 MED ORDER — QUETIAPINE FUMARATE 50 MG PO TABS: 50.0000 mg | ORAL_TABLET | Freq: Every day | ORAL | 1 refills | Status: DC

## 2019-04-21 NOTE — Progress Notes (Signed)
Subjective:  Patient ID: Deborah Chung, female    DOB: 01-04-1927  Age: 86 y.o. MRN: [id number]  CC: Annual Exam, Rash, Congestive Heart Failure, Hypertension, and Atrial Fibrillation  This visit occurred during the SARS-CoV-2 public health emergency.  Safety protocols were in place, including screening questions prior to the visit, additional usage of staff PPE, and extensive cleaning of exam room while observing appropriate contact time as indicated for disinfecting solutions.    HPI Deborah Chung presents for a CPX.  She comes in today with her daughter-in-law who tells me most of the history.  The patient has declined cognitively over the last year.  She has had to be transitioned into a memory unit. She has been found wandering.  She is also having auditory hallucinations.  She complains that she is hearing music in the evenings and during the night that interferes with her sleeping and causes her great distress.  The music is not heard by anyone else.  Complains of a several week history of red rash under both breasts.  She has a history of postherpetic neuralgia in the left flank.  Based on her MAR it appears that she is not currently receiving Lyrica.  She is complaining that there is pain in the area that interferes with  Outpatient Medications Prior to Visit  Medication Sig Dispense Refill  . ALPRAZolam (XANAX) 0.25 MG tablet TAKE 1 TABLET BY MOUTH AS NEEDED FOR ANXIETY 30 tablet 5  . aspirin 81 MG tablet Take 81 mg by mouth daily.    Marland Kitchen BIOTIN PO Take by mouth daily.    . diphenoxylate-atropine (LOMOTIL) 2.5-0.025 MG tablet Take 1 tablet by mouth every 6 (six) hours as needed for diarrhea or loose stools. 90 tablet 2  . diphenoxylate-atropine (LOMOTIL) 2.5-0.025 MG tablet Take 1 tablet by mouth 2 (two) times daily. 60 tablet 11  . DULoxetine (CYMBALTA) 60 MG capsule Take 1 capsule (60 mg total) by mouth daily. 90 capsule 1  . ELIQUIS 2.5 MG TABS tablet TAKE 1 TABLET BY MOUTH TWICE A  DAY 180 tablet 1  . famotidine (PEPCID) 20 MG tablet Take 1 tablet (20 mg total) by mouth daily. Discontinue dexilant 90 tablet 1  . lipase/protease/amylase (CREON) 36000 UNITS CPEP capsule Take 2 capsules with each meal and 1 capsule with each snack 720 capsule 1  . metoprolol tartrate (LOPRESSOR) 25 MG tablet TAKE 1 TABLET BY MOUTH TWICE A DAY 180 tablet 1  . Omega-3 Fatty Acids (OMEGA-3 FISH OIL PO) Take by mouth.    . pravastatin (PRAVACHOL) 20 MG tablet TAKE 1 TABLET BY MOUTH EVERY DAY 90 tablet 1  . Probiotic Product (PROBIOTIC PO) Take 1 tablet by mouth daily.    Marland Kitchen spironolactone (ALDACTONE) 25 MG tablet TAKE 1 TABLET BY MOUTH EVERY DAY 90 tablet 1  . traZODone (DESYREL) 50 MG tablet TAKE 1 TABLET (50 MG TOTAL) BY MOUTH AT BEDTIME AS NEEDED FOR SLEEP. 90 tablet 1  . pregabalin (LYRICA) 75 MG capsule Take 1 capsule (75 mg total) by mouth 3 (three) times daily. 270 capsule 1   No facility-administered medications prior to visit.    ROS Review of Systems  Constitutional: Positive for fatigue and unexpected weight change (wt loss). Negative for diaphoresis.  HENT: Negative.   Eyes: Negative for visual disturbance.  Respiratory: Negative for cough, chest tightness, shortness of breath and wheezing.   Cardiovascular: Negative for chest pain, palpitations and leg swelling.  Gastrointestinal: Negative for abdominal pain.  Genitourinary: Negative.  Negative for difficulty urinating.  Musculoskeletal: Negative for arthralgias and myalgias.  Skin: Positive for color change and rash.  Neurological: Negative for dizziness, weakness and light-headedness.  Hematological: Negative.  Negative for adenopathy. Does not bruise/bleed easily.  Psychiatric/Behavioral: Positive for behavioral problems, confusion, hallucinations and sleep disturbance. Negative for agitation, self-injury and suicidal ideas. The patient is not nervous/anxious.     Objective:  BP 110/60 (BP Location: Right Arm, Patient  Position: Sitting, Cuff Size: Large)   Pulse 70   Temp 97.9 F (36.6 C) (Oral)   Resp 16   Ht 5' 4.5" (1.638 m)   Wt 179 lb (81.2 kg)   SpO2 93%   BMI 30.25 kg/m   BP Readings from Last 3 Encounters:  04/21/19 110/60  02/15/19 127/72  01/28/19 108/62    Wt Readings from Last 3 Encounters:  04/21/19 179 lb (81.2 kg)  02/15/19 183 lb (83 kg)  01/28/19 186 lb (84.4 kg)    Physical Exam Vitals reviewed.  Constitutional:     HENT:     Nose: Nose normal.     Mouth/Throat:     Mouth: Mucous membranes are moist.  Eyes:     General: No scleral icterus.    Conjunctiva/sclera: Conjunctivae normal.  Cardiovascular:     Rate and Rhythm: Normal rate and regular rhythm.     Heart sounds: No murmur.  Pulmonary:     Effort: Pulmonary effort is normal.     Breath sounds: No stridor. No wheezing, rhonchi or rales.  Abdominal:     General: Abdomen is flat. Bowel sounds are normal. There is no distension.     Palpations: Abdomen is soft. There is no hepatomegaly, splenomegaly or mass.     Tenderness: There is no abdominal tenderness.  Musculoskeletal:        General: Normal range of motion.     Cervical back: Neck supple.     Right lower leg: No edema.     Left lower leg: No edema.  Lymphadenopathy:     Cervical: No cervical adenopathy.  Skin:    General: Skin is warm and dry.  Neurological:     General: No focal deficit present.     Mental Status: Mental status is at baseline.  Psychiatric:        Attention and Perception: She is inattentive. She does not perceive auditory or visual hallucinations.        Mood and Affect: Mood normal. Affect is flat.        Speech: Speech is delayed and tangential.        Behavior: Behavior is slowed and withdrawn. Behavior is cooperative.        Thought Content: Thought content is not paranoid or delusional. Thought content does not include homicidal or suicidal ideation.        Cognition and Memory: Cognition is impaired. Memory is  impaired. She exhibits impaired recent memory and impaired remote memory.     Lab Results  Component Value Date   WBC 7.4 04/21/2019   HGB 14.9 04/21/2019   HCT 44.8 04/21/2019   PLT 220.0 04/21/2019   GLUCOSE 125 (H) 04/21/2019   CHOL 192 04/21/2019   TRIG 198.0 (H) 04/21/2019   HDL 43.40 04/21/2019   LDLDIRECT 106.0 05/16/2014   LDLCALC 109 (H) 04/21/2019   ALT 11 04/21/2019   AST 14 04/21/2019   NA 138 04/21/2019   K 4.5 04/21/2019   CL 102 04/21/2019   CREATININE 1.12 04/21/2019   BUN  19 04/21/2019   CO2 32 04/21/2019   TSH 1.45 04/21/2019   HGBA1C 5.6 04/21/2019    CT Head Wo Contrast  Result Date: 02/22/2018 CLINICAL DATA:  Mechanical fall, left head injury, on Eliquis EXAM: CT HEAD WITHOUT CONTRAST CT CERVICAL SPINE WITHOUT CONTRAST TECHNIQUE: Multidetector CT imaging of the head and cervical spine was performed following the standard protocol without intravenous contrast. Multiplanar CT image reconstructions of the cervical spine were also generated. COMPARISON:  None. FINDINGS: CT HEAD FINDINGS Brain: No evidence of acute infarction, hemorrhage, hydrocephalus, extra-axial collection or mass lesion/mass effect. Subcortical white matter and periventricular small vessel ischemic changes. Vascular: Intracranial atherosclerosis. Skull: Normal. Negative for fracture or focal lesion. Sinuses/Orbits: The visualized paranasal sinuses are essentially clear. The mastoid air cells are unopacified. Other: None. CT CERVICAL SPINE FINDINGS Alignment: Normal cervical lordosis. Skull base and vertebrae: No acute fracture. No primary bone lesion or focal pathologic process. Soft tissues and spinal canal: No prevertebral fluid or swelling. No visible canal hematoma. Disc levels: Mild degenerative changes of the mid/lower cervical spine. Spinal canal is patent. Upper chest: Right apical pleural-parenchymal scarring. Other: Visualized thyroid is unremarkable. IMPRESSION: No evidence of acute  intracranial abnormality. Small vessel ischemic changes. No evidence of traumatic injury to the cervical spine. Mild degenerative changes. Electronically Signed   By: Charline Bills M.D.   On: 02/22/2018 19:03   CT Cervical Spine Wo Contrast  Result Date: 02/22/2018 CLINICAL DATA:  Mechanical fall, left head injury, on Eliquis EXAM: CT HEAD WITHOUT CONTRAST CT CERVICAL SPINE WITHOUT CONTRAST TECHNIQUE: Multidetector CT imaging of the head and cervical spine was performed following the standard protocol without intravenous contrast. Multiplanar CT image reconstructions of the cervical spine were also generated. COMPARISON:  None. FINDINGS: CT HEAD FINDINGS Brain: No evidence of acute infarction, hemorrhage, hydrocephalus, extra-axial collection or mass lesion/mass effect. Subcortical white matter and periventricular small vessel ischemic changes. Vascular: Intracranial atherosclerosis. Skull: Normal. Negative for fracture or focal lesion. Sinuses/Orbits: The visualized paranasal sinuses are essentially clear. The mastoid air cells are unopacified. Other: None. CT CERVICAL SPINE FINDINGS Alignment: Normal cervical lordosis. Skull base and vertebrae: No acute fracture. No primary bone lesion or focal pathologic process. Soft tissues and spinal canal: No prevertebral fluid or swelling. No visible canal hematoma. Disc levels: Mild degenerative changes of the mid/lower cervical spine. Spinal canal is patent. Upper chest: Right apical pleural-parenchymal scarring. Other: Visualized thyroid is unremarkable. IMPRESSION: No evidence of acute intracranial abnormality. Small vessel ischemic changes. No evidence of traumatic injury to the cervical spine. Mild degenerative changes. Electronically Signed   By: Charline Bills M.D.   On: 02/22/2018 19:03    Assessment & Plan:   Deborah Chung was seen today for annual exam, rash, congestive heart failure, hypertension and atrial fibrillation.  Diagnoses and all orders for  this visit:  Tinea corporis -     fluconazole (DIFLUCAN) 100 MG tablet; Take 1 tablet (100 mg total) by mouth daily for 10 days.  PHN (postherpetic neuralgia)- Will restart lyrica. -     pregabalin (LYRICA) 75 MG capsule; Take 1 capsule (75 mg total) by mouth 3 (three) times daily.  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 I encouraged the family to keep her engaged and active. -     QUEtiapine (SEROQUEL) 50 MG tablet; Take 1 tablet (50 mg total) by mouth at bedtime.  Paroxysmal atrial fibrillation (HCC)- She has achieved good rate and rhythm control.  Essential hypertension,  benign- Her BP is well controlled. Lytes and rena function are normal. -     CBC with Differential/Platelet; Future -     TSH; Future -     TSH -     CBC with Differential/Platelet  Hypertriglyceridemia- Mildly elevated. Does not need to be treated. -     Lipid panel; Future -     Lipid panel  Hyperglycemia- Her A1C is at 5.6%. treatment is not indicated. -     Basic metabolic panel; Future -     Hemoglobin A1c; Future -     Hemoglobin A1c -     Basic metabolic panel  Hyperlipidemia with target LDL less than 100- Statin therapy is not indicated. -     Lipid panel; Future -     TSH; Future -     Hepatic function panel; Future -     Hepatic function panel -     TSH -     Lipid panel  Routine general medical examination at a health care facility- Exam completed, labs reviewed, vaccines reviewed and updated, no cancer screenings are indicated, patient education was given.  Stage 3b chronic kidney disease- She will avoid nephrotoxic agents.   I am having Deborah Chung start on fluconazole and QUEtiapine. I am also having her maintain her Probiotic Product (PROBIOTIC PO), Omega-3 Fatty Acids (OMEGA-3 FISH OIL PO), ALPRAZolam, aspirin, BIOTIN PO, metoprolol tartrate, DULoxetine, famotidine, pravastatin, traZODone, Eliquis, spironolactone,  diphenoxylate-atropine, lipase/protease/amylase, diphenoxylate-atropine, and pregabalin.  Meds ordered this encounter  Medications  . fluconazole (DIFLUCAN) 100 MG tablet    Sig: Take 1 tablet (100 mg total) by mouth daily for 10 days.    Dispense:  10 tablet    Refill:  0  . pregabalin (LYRICA) 75 MG capsule    Sig: Take 1 capsule (75 mg total) by mouth 3 (three) times daily.    Dispense:  270 capsule    Refill:  1    This request is for a new prescription for a controlled substance as required by Federal/State law..  . QUEtiapine (SEROQUEL) 50 MG tablet    Sig: Take 1 tablet (50 mg total) by mouth at bedtime.    Dispense:  90 tablet    Refill:  1   I spent 50 minutes in preparing to see the patient by review of recent labs, imaging and procedures, obtaining and reviewing separately obtained history, communicating with the patient and family or caregiver, ordering medications, tests or procedures, and documenting clinical information in the EHR including the differential Dx, treatment, and any further evaluation and other management of 1. Tinea corporis 2. PHN (postherpetic neuralgia) 3. 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 Paroxysmal atrial fibrillation (HCC) 5. Essential hypertension, benign 6. Hypertriglyceridemia 7. Hyperglycemia 8. Hyperlipidemia with target LDL less than 100 9. Stage 3b chronic kidney disease    Follow-up: Return in about 6 months (around 10/21/2019).  Sanda Linger, MD

## 2019-04-21 NOTE — Patient Instructions (Signed)
Health Maintenance, Female Adopting a healthy lifestyle and getting preventive care are important in promoting health and wellness. Ask your health care provider about:  The right schedule for you to have regular tests and exams.  Things you can do on your own to prevent diseases and keep yourself healthy. What should I know about diet, weight, and exercise? Eat a healthy diet   Eat a diet that includes plenty of vegetables, fruits, low-fat dairy products, and lean protein.  Do not eat a lot of foods that are high in solid fats, added sugars, or sodium. Maintain a healthy weight Body mass index (BMI) is used to identify weight problems. It estimates body fat based on height and weight. Your health care provider can help determine your BMI and help you achieve or maintain a healthy weight. Get regular exercise Get regular exercise. This is one of the most important things you can do for your health. Most adults should:  Exercise for at least 150 minutes each week. The exercise should increase your heart rate and make you sweat (moderate-intensity exercise).  Do strengthening exercises at least twice a week. This is in addition to the moderate-intensity exercise.  Spend less time sitting. Even light physical activity can be beneficial. Watch cholesterol and blood lipids Have your blood tested for lipids and cholesterol at 84 years of age, then have this test every 5 years. Have your cholesterol levels checked more often if:  Your lipid or cholesterol levels are high.  You are older than 84 years of age.  You are at high risk for heart disease. What should I know about cancer screening? Depending on your health history and family history, you may need to have cancer screening at various ages. This may include screening for:  Breast cancer.  Cervical cancer.  Colorectal cancer.  Skin cancer.  Lung cancer. What should I know about heart disease, diabetes, and high blood  pressure? Blood pressure and heart disease  High blood pressure causes heart disease and increases the risk of stroke. This is more likely to develop in people who have high blood pressure readings, are of African descent, or are overweight.  Have your blood pressure checked: ? Every 3-5 years if you are 18-39 years of age. ? Every year if you are 40 years old or older. Diabetes Have regular diabetes screenings. This checks your fasting blood sugar level. Have the screening done:  Once every three years after age 40 if you are at a normal weight and have a low risk for diabetes.  More often and at a younger age if you are overweight or have a high risk for diabetes. What should I know about preventing infection? Hepatitis B If you have a higher risk for hepatitis B, you should be screened for this virus. Talk with your health care provider to find out if you are at risk for hepatitis B infection. Hepatitis C Testing is recommended for:  Everyone born from 1945 through 1965.  Anyone with known risk factors for hepatitis C. Sexually transmitted infections (STIs)  Get screened for STIs, including gonorrhea and chlamydia, if: ? You are sexually active and are younger than 84 years of age. ? You are older than 84 years of age and your health care provider tells you that you are at risk for this type of infection. ? Your sexual activity has changed since you were last screened, and you are at increased risk for chlamydia or gonorrhea. Ask your health care provider if   you are at risk.  Ask your health care provider about whether you are at high risk for HIV. Your health care provider may recommend a prescription medicine to help prevent HIV infection. If you choose to take medicine to prevent HIV, you should first get tested for HIV. You should then be tested every 3 months for as long as you are taking the medicine. Pregnancy  If you are about to stop having your period (premenopausal) and  you may become pregnant, seek counseling before you get pregnant.  Take 400 to 800 micrograms (mcg) of folic acid every day if you become pregnant.  Ask for birth control (contraception) if you want to prevent pregnancy. Osteoporosis and menopause Osteoporosis is a disease in which the bones lose minerals and strength with aging. This can result in bone fractures. If you are 65 years old or older, or if you are at risk for osteoporosis and fractures, ask your health care provider if you should:  Be screened for bone loss.  Take a calcium or vitamin D supplement to lower your risk of fractures.  Be given hormone replacement therapy (HRT) to treat symptoms of menopause. Follow these instructions at home: Lifestyle  Do not use any products that contain nicotine or tobacco, such as cigarettes, e-cigarettes, and chewing tobacco. If you need help quitting, ask your health care provider.  Do not use street drugs.  Do not share needles.  Ask your health care provider for help if you need support or information about quitting drugs. Alcohol use  Do not drink alcohol if: ? Your health care provider tells you not to drink. ? You are pregnant, may be pregnant, or are planning to become pregnant.  If you drink alcohol: ? Limit how much you use to 0-1 drink a day. ? Limit intake if you are breastfeeding.  Be aware of how much alcohol is in your drink. In the [place] one drink equals one 12 oz bottle of beer (355 mL), one 5 oz glass of wine (148 mL), or one 1 oz glass of hard liquor (44 mL). General instructions  Schedule regular health, dental, and eye exams.  Stay current with your vaccines.  Tell your health care provider if: ? You often feel depressed. ? You have ever been abused or do not feel safe at home. Summary  Adopting a healthy lifestyle and getting preventive care are important in promoting health and wellness.  Follow your health care provider's instructions about healthy  diet, exercising, and getting tested or screened for diseases.  Follow your health care provider's instructions on monitoring your cholesterol and blood pressure. This information is not intended to replace advice given to you by your health care provider. Make sure you discuss any questions you have with your health care provider. Document Revised: 12/24/2017 Document Reviewed: 12/24/2017 Elsevier Patient Education  2020 [employer]  

## 2019-04-22 DIAGNOSIS — R278 Other lack of coordination: Secondary | ICD-10-CM | POA: Diagnosis not present

## 2019-04-22 DIAGNOSIS — M25572 Pain in left ankle and joints of left foot: Secondary | ICD-10-CM | POA: Diagnosis not present

## 2019-04-22 DIAGNOSIS — Z1159 Encounter for screening for other viral diseases: Secondary | ICD-10-CM | POA: Diagnosis not present

## 2019-04-22 DIAGNOSIS — Z9181 History of falling: Secondary | ICD-10-CM | POA: Diagnosis not present

## 2019-04-22 DIAGNOSIS — R262 Difficulty in walking, not elsewhere classified: Secondary | ICD-10-CM | POA: Diagnosis not present

## 2019-04-22 DIAGNOSIS — R296 Repeated falls: Secondary | ICD-10-CM | POA: Diagnosis not present

## 2019-04-22 DIAGNOSIS — M6281 Muscle weakness (generalized): Secondary | ICD-10-CM | POA: Diagnosis not present

## 2019-04-22 DIAGNOSIS — Z20828 Contact with and (suspected) exposure to other viral communicable diseases: Secondary | ICD-10-CM | POA: Diagnosis not present

## 2019-04-22 DIAGNOSIS — R41841 Cognitive communication deficit: Secondary | ICD-10-CM | POA: Diagnosis not present

## 2019-04-22 DIAGNOSIS — R2681 Unsteadiness on feet: Secondary | ICD-10-CM | POA: Diagnosis not present

## 2019-04-23 DIAGNOSIS — R296 Repeated falls: Secondary | ICD-10-CM | POA: Diagnosis not present

## 2019-04-23 DIAGNOSIS — R262 Difficulty in walking, not elsewhere classified: Secondary | ICD-10-CM | POA: Diagnosis not present

## 2019-04-23 DIAGNOSIS — R2681 Unsteadiness on feet: Secondary | ICD-10-CM | POA: Diagnosis not present

## 2019-04-23 DIAGNOSIS — R278 Other lack of coordination: Secondary | ICD-10-CM | POA: Diagnosis not present

## 2019-04-23 DIAGNOSIS — R41841 Cognitive communication deficit: Secondary | ICD-10-CM | POA: Diagnosis not present

## 2019-04-23 DIAGNOSIS — M6281 Muscle weakness (generalized): Secondary | ICD-10-CM | POA: Diagnosis not present

## 2019-04-23 DIAGNOSIS — M25572 Pain in left ankle and joints of left foot: Secondary | ICD-10-CM | POA: Diagnosis not present

## 2019-04-23 DIAGNOSIS — Z9181 History of falling: Secondary | ICD-10-CM | POA: Diagnosis not present

## 2019-04-26 DIAGNOSIS — R278 Other lack of coordination: Secondary | ICD-10-CM | POA: Diagnosis not present

## 2019-04-26 DIAGNOSIS — R41841 Cognitive communication deficit: Secondary | ICD-10-CM | POA: Diagnosis not present

## 2019-04-26 DIAGNOSIS — R2681 Unsteadiness on feet: Secondary | ICD-10-CM | POA: Diagnosis not present

## 2019-04-26 DIAGNOSIS — Z1159 Encounter for screening for other viral diseases: Secondary | ICD-10-CM | POA: Diagnosis not present

## 2019-04-26 DIAGNOSIS — Z9181 History of falling: Secondary | ICD-10-CM | POA: Diagnosis not present

## 2019-04-26 DIAGNOSIS — M25572 Pain in left ankle and joints of left foot: Secondary | ICD-10-CM | POA: Diagnosis not present

## 2019-04-26 DIAGNOSIS — Z20828 Contact with and (suspected) exposure to other viral communicable diseases: Secondary | ICD-10-CM | POA: Diagnosis not present

## 2019-04-26 DIAGNOSIS — R296 Repeated falls: Secondary | ICD-10-CM | POA: Diagnosis not present

## 2019-04-26 DIAGNOSIS — M6281 Muscle weakness (generalized): Secondary | ICD-10-CM | POA: Diagnosis not present

## 2019-04-26 DIAGNOSIS — R262 Difficulty in walking, not elsewhere classified: Secondary | ICD-10-CM | POA: Diagnosis not present

## 2019-04-28 ENCOUNTER — Emergency Department (HOSPITAL_COMMUNITY): Payer: Medicare PPO

## 2019-04-28 ENCOUNTER — Emergency Department (HOSPITAL_COMMUNITY)
Admission: EM | Admit: 2019-04-28 | Discharge: 2019-04-28 | Disposition: A | Discharge status: Home / Self Care | Payer: Medicare PPO | Attending: Emergency Medicine | Admitting: Emergency Medicine

## 2019-04-28 ENCOUNTER — Other Ambulatory Visit: Payer: Self-pay

## 2019-04-28 ENCOUNTER — Encounter (HOSPITAL_COMMUNITY): Payer: Self-pay

## 2019-04-28 ENCOUNTER — Telehealth: Payer: Self-pay | Admitting: Internal Medicine

## 2019-04-28 DIAGNOSIS — Z7982 Long term (current) use of aspirin: Secondary | ICD-10-CM | POA: Insufficient documentation

## 2019-04-28 DIAGNOSIS — M25572 Pain in left ankle and joints of left foot: Secondary | ICD-10-CM | POA: Diagnosis not present

## 2019-04-28 DIAGNOSIS — R41841 Cognitive communication deficit: Secondary | ICD-10-CM | POA: Diagnosis not present

## 2019-04-28 DIAGNOSIS — E860 Dehydration: Secondary | ICD-10-CM | POA: Diagnosis not present

## 2019-04-28 DIAGNOSIS — Z87891 Personal history of nicotine dependence: Secondary | ICD-10-CM | POA: Diagnosis not present

## 2019-04-28 DIAGNOSIS — W07XXXA Fall from chair, initial encounter: Secondary | ICD-10-CM | POA: Diagnosis not present

## 2019-04-28 DIAGNOSIS — S0003XA Contusion of scalp, initial encounter: Secondary | ICD-10-CM | POA: Diagnosis not present

## 2019-04-28 DIAGNOSIS — M6281 Muscle weakness (generalized): Secondary | ICD-10-CM | POA: Diagnosis not present

## 2019-04-28 DIAGNOSIS — I5020 Unspecified systolic (congestive) heart failure: Secondary | ICD-10-CM | POA: Insufficient documentation

## 2019-04-28 DIAGNOSIS — R278 Other lack of coordination: Secondary | ICD-10-CM | POA: Diagnosis not present

## 2019-04-28 DIAGNOSIS — I10 Essential (primary) hypertension: Secondary | ICD-10-CM | POA: Diagnosis not present

## 2019-04-28 DIAGNOSIS — I110 Hypertensive heart disease with heart failure: Secondary | ICD-10-CM | POA: Insufficient documentation

## 2019-04-28 DIAGNOSIS — I959 Hypotension, unspecified: Secondary | ICD-10-CM | POA: Diagnosis not present

## 2019-04-28 DIAGNOSIS — Z7901 Long term (current) use of anticoagulants: Secondary | ICD-10-CM | POA: Diagnosis not present

## 2019-04-28 DIAGNOSIS — Z96653 Presence of artificial knee joint, bilateral: Secondary | ICD-10-CM | POA: Diagnosis not present

## 2019-04-28 DIAGNOSIS — W19XXXA Unspecified fall, initial encounter: Secondary | ICD-10-CM

## 2019-04-28 DIAGNOSIS — R2681 Unsteadiness on feet: Secondary | ICD-10-CM | POA: Diagnosis not present

## 2019-04-28 DIAGNOSIS — Z950 Presence of cardiac pacemaker: Secondary | ICD-10-CM | POA: Insufficient documentation

## 2019-04-28 DIAGNOSIS — Y939 Activity, unspecified: Secondary | ICD-10-CM | POA: Diagnosis not present

## 2019-04-28 DIAGNOSIS — R296 Repeated falls: Secondary | ICD-10-CM | POA: Diagnosis not present

## 2019-04-28 DIAGNOSIS — S0990XA Unspecified injury of head, initial encounter: Secondary | ICD-10-CM | POA: Diagnosis not present

## 2019-04-28 DIAGNOSIS — Y92128 Other place in nursing home as the place of occurrence of the external cause: Secondary | ICD-10-CM | POA: Diagnosis not present

## 2019-04-28 DIAGNOSIS — R262 Difficulty in walking, not elsewhere classified: Secondary | ICD-10-CM | POA: Diagnosis not present

## 2019-04-28 DIAGNOSIS — Y999 Unspecified external cause status: Secondary | ICD-10-CM | POA: Diagnosis not present

## 2019-04-28 DIAGNOSIS — S0291XA Unspecified fracture of skull, initial encounter for closed fracture: Secondary | ICD-10-CM | POA: Diagnosis not present

## 2019-04-28 DIAGNOSIS — Z9181 History of falling: Secondary | ICD-10-CM | POA: Diagnosis not present

## 2019-04-28 DIAGNOSIS — R42 Dizziness and giddiness: Secondary | ICD-10-CM | POA: Diagnosis not present

## 2019-04-28 DIAGNOSIS — S199XXA Unspecified injury of neck, initial encounter: Secondary | ICD-10-CM | POA: Diagnosis not present

## 2019-04-28 DIAGNOSIS — Z79899 Other long term (current) drug therapy: Secondary | ICD-10-CM | POA: Insufficient documentation

## 2019-04-28 DIAGNOSIS — R0902 Hypoxemia: Secondary | ICD-10-CM | POA: Diagnosis not present

## 2019-04-28 HISTORY — DX: Presence of cardiac pacemaker: Z95.0

## 2019-04-28 LAB — URINALYSIS, ROUTINE W REFLEX MICROSCOPIC
Bilirubin Urine: NEGATIVE
Glucose, UA: NEGATIVE mg/dL
Ketones, ur: NEGATIVE mg/dL
Nitrite: NEGATIVE
Protein, ur: NEGATIVE mg/dL
Specific Gravity, Urine: 1.004 — ABNORMAL LOW (ref 1.005–1.030)
pH: 6 (ref 5.0–8.0)

## 2019-04-28 LAB — BASIC METABOLIC PANEL
Anion gap: 6 (ref 5–15)
BUN: 21 mg/dL (ref 8–23)
CO2: 26 mmol/L (ref 22–32)
Calcium: 8.5 mg/dL — ABNORMAL LOW (ref 8.9–10.3)
Chloride: 105 mmol/L (ref 98–111)
Creatinine, Ser: 1.1 mg/dL — ABNORMAL HIGH (ref 0.44–1.00)
GFR calc Af Amer: 51 mL/min — ABNORMAL LOW (ref >60)
GFR calc non Af Amer: 44 mL/min — ABNORMAL LOW (ref >60)
Glucose, Bld: 95 mg/dL (ref 70–99)
Potassium: 4.9 mmol/L (ref 3.5–5.1)
Sodium: 137 mmol/L (ref 135–145)

## 2019-04-28 LAB — CBC WITH DIFFERENTIAL/PLATELET
Abs Immature Granulocytes: 0.02 10*3/uL (ref 0.00–0.07)
Basophils Absolute: 0 10*3/uL (ref 0.0–0.1)
Basophils Relative: 1 %
Eosinophils Absolute: 0.3 10*3/uL (ref 0.0–0.5)
Eosinophils Relative: 4 %
HCT: 41.7 % (ref 36.0–46.0)
Hemoglobin: 13.2 g/dL (ref 12.0–15.0)
Immature Granulocytes: 0 %
Lymphocytes Relative: 29 %
Lymphs Abs: 1.8 10*3/uL (ref 0.7–4.0)
MCH: 31.7 pg (ref 26.0–34.0)
MCHC: 31.7 g/dL (ref 30.0–36.0)
MCV: 100 fL (ref 80.0–100.0)
Monocytes Absolute: 0.7 10*3/uL (ref 0.1–1.0)
Monocytes Relative: 12 %
Neutro Abs: 3.3 10*3/uL (ref 1.7–7.7)
Neutrophils Relative %: 54 %
Platelets: 186 10*3/uL (ref 150–400)
RBC: 4.17 MIL/uL (ref 3.87–5.11)
RDW: 13.8 % (ref 11.5–15.5)
WBC: 6.2 10*3/uL (ref 4.0–10.5)
nRBC: 0 % (ref 0.0–0.2)

## 2019-04-28 LAB — SAMPLE TO BLOOD BANK

## 2019-04-28 LAB — PROTIME-INR
INR: 1.1 (ref 0.8–1.2)
Prothrombin Time: 13.9 seconds (ref 11.4–15.2)

## 2019-04-28 MED ORDER — SODIUM CHLORIDE 0.9 % IV BOLUS
500.0000 mL | Freq: Once | INTRAVENOUS | Status: AC
  Administered 2019-04-28: 500 mL via INTRAVENOUS

## 2019-04-28 NOTE — Discharge Instructions (Addendum)
You were seen in the emergency department for evaluation of injuries from a fall.  You had a CAT scan of your head and cervical spine that did not show any serious findings.  You had some dizziness initially but that improved while you were here.  Please follow-up with your regular doctor and return to the emergency department for any worsening or concerning symptoms.

## 2019-04-28 NOTE — Telephone Encounter (Signed)
These have been faxed back.

## 2019-04-28 NOTE — ED Provider Notes (Signed)
[facility] EMERGENCY DEPARTMENT Provider Note   CSN: [id number] Arrival date & time:        History No chief complaint on file.   Deborah Chung is a 86 y.o. female.  She is a resident at [facility].  She had a witnessed fall where she was trying to get up out of a chair at the activities room falling backwards onto her buttocks and then striking her head.  She is on anticoagulation and so was a level 2 trauma activation.  Apparently she was very dizzy when they tried to stand her up and so they gave her some IV fluids.  Vitals been stable in transport.  She is complaining of pain to the back of her head from striking it.  She denies any other pain from the fall.  She has some chronic knee pain.  No chest pain abdominal pain back pain neck pain.  No numbness or weakness.  The history is provided by the patient and the EMS personnel.  Fall This is a new problem. The current episode started less than 1 hour ago. The problem occurs rarely. The problem has not changed since onset.Associated symptoms include headaches. Pertinent negatives include no chest pain, no abdominal pain and no shortness of breath. Nothing aggravates the symptoms. Nothing relieves the symptoms. She has tried nothing for the symptoms. The treatment provided no relief.       Past Medical History:  Diagnosis Date  . Anxiety   . Atrial fibrillation (HCC)   . Blood transfusion without reported diagnosis   . GAD (generalized anxiety disorder)   . Hearing loss   . Hyperlipidemia   . Hypertension   . Pacemaker   . PHN (postherpetic neuralgia) 2010  . Systolic CHF (HCC)     There are no problems to display for this patient.   Past Surgical History:  Procedure Laterality Date  . ABDOMINAL HYSTERECTOMY    . PACEMAKER PLACEMENT    . TOTAL KNEE ARTHROPLASTY Bilateral      OB History   No obstetric history on file.     No family history on file.  Social History   Tobacco Use  . Smoking  status: Former Smoker    Years: 25.00    Quit date: 08/03/1988    Years since quitting: 30.7  . Smokeless tobacco: Never Used  Substance Use Topics  . Alcohol use: Not Currently  . Drug use: No    Home Medications Prior to Admission medications   Medication Sig Start Date End Date Taking? Authorizing Provider  ALPRAZolam Prudy Feeler) 0.25 MG tablet Take 0.25 mg by mouth daily as needed for anxiety.  04/05/19  Yes [provider]  aspirin 81 MG chewable tablet Chew 81 mg by mouth daily.   Yes [provider]  Biotin 1000 MCG tablet Take 1,000 mcg by mouth daily.   Yes [provider]  CREON 36000 units CPEP capsule Take 72,000 Units by mouth 3 (three) times daily before meals.  04/17/19  Yes [provider]  diphenoxylate-atropine (LOMOTIL) 2.5-0.025 MG tablet Take 1 tablet by mouth daily. 04/05/19  Yes [provider]  DULoxetine (CYMBALTA) 60 MG capsule Take 60 mg by mouth daily. 04/17/19  Yes [provider]  ELIQUIS 2.5 MG TABS tablet Take 2.5 mg by mouth 2 (two) times daily. 04/17/19  Yes [provider]  famotidine (PEPCID) 20 MG tablet Take 20 mg by mouth daily. 04/17/19  Yes [provider]  fluconazole (DIFLUCAN)  100 MG tablet Take 100 mg by mouth daily. 04/21/19  Yes [provider]  loratadine (CLARITIN) 10 MG tablet Take 10 mg by mouth daily.   Yes [provider]  metoprolol tartrate (LOPRESSOR) 25 MG tablet Take 25 mg by mouth 2 (two) times daily. 04/17/19  Yes [provider]  ondansetron (ZOFRAN) 4 MG tablet Take 4 mg by mouth every 4 (four) hours as needed for nausea or vomiting.   Yes [provider]  Polyvinyl Alcohol-Povidone (CLEAR EYES NATURAL TEARS OP) Apply 1 drop to eye every 4 (four) hours as needed (Dry eyes).   Yes [provider]  pregabalin (LYRICA) 75 MG capsule Take 75 mg by mouth 3 (three) times daily. 04/21/19  Yes [provider]  QUEtiapine (SEROQUEL)  50 MG tablet Take 50 mg by mouth at bedtime. 04/21/19  Yes [provider]  saccharomyces boulardii (FLORASTOR) 250 MG capsule Take 250 mg by mouth daily.   Yes [provider]  spironolactone (ALDACTONE) 25 MG tablet Take 25 mg by mouth daily. 04/17/19  Yes [provider]  traZODone (DESYREL) 50 MG tablet Take 50 mg by mouth at bedtime as needed for sleep.  04/05/19  Yes [provider]  ALPRAZolam Prudy Feeler) 0.25 MG tablet TAKE 1 TABLET BY MOUTH AS NEEDED FOR ANXIETY 07/07/17   Etta Grandchild, MD  aspirin 81 MG tablet Take 81 mg by mouth daily.    [provider]  BIOTIN PO Take by mouth daily.    [provider]  diphenoxylate-atropine (LOMOTIL) 2.5-0.025 MG tablet Take 1 tablet by mouth every 6 (six) hours as needed for diarrhea or loose stools. 11/03/18   Rachael Fee, MD  diphenoxylate-atropine (LOMOTIL) 2.5-0.025 MG tablet Take 1 tablet by mouth 2 (two) times daily. 12/04/18   Rachael Fee, MD  DULoxetine (CYMBALTA) 60 MG capsule Take 1 capsule (60 mg total) by mouth daily. 01/27/18   Etta Grandchild, MD  ELIQUIS 2.5 MG TABS tablet TAKE 1 TABLET BY MOUTH TWICE A DAY 06/05/18   Etta Grandchild, MD  famotidine (PEPCID) 20 MG tablet Take 1 tablet (20 mg total) by mouth daily. Discontinue dexilant 03/13/18   Unk Lightning, PA  fluconazole (DIFLUCAN) 100 MG tablet Take 1 tablet (100 mg total) by mouth daily for 10 days. 04/21/19 05/01/19  Etta Grandchild, MD  lipase/protease/amylase (CREON) 36000 UNITS CPEP capsule Take 2 capsules with each meal and 1 capsule with each snack 11/03/18   Rachael Fee, MD  metoprolol tartrate (LOPRESSOR) 25 MG tablet TAKE 1 TABLET BY MOUTH TWICE A DAY 01/13/18   Etta Grandchild, MD  Omega-3 Fatty Acids (OMEGA-3 FISH OIL PO) Take by mouth.    [provider]  pravastatin (PRAVACHOL) 20 MG tablet TAKE 1 TABLET BY MOUTH EVERY DAY 03/27/18   Etta Grandchild, MD  pregabalin (LYRICA) 75 MG capsule Take 1  capsule (75 mg total) by mouth 3 (three) times daily. 04/21/19   Etta Grandchild, MD  Probiotic Product (PROBIOTIC PO) Take 1 tablet by mouth daily.    [provider]  QUEtiapine (SEROQUEL) 50 MG tablet Take 1 tablet (50 mg total) by mouth at bedtime. 04/21/19   Etta Grandchild, MD  spironolactone (ALDACTONE) 25 MG tablet TAKE 1 TABLET BY MOUTH EVERY DAY 06/12/18   Etta Grandchild, MD  traZODone (DESYREL) 50 MG tablet TAKE 1 TABLET (50 MG TOTAL) BY MOUTH AT BEDTIME AS NEEDED FOR SLEEP. 05/03/18  Janith Lima, MD    Allergies    Patient has no known allergies.  Review of Systems   Review of Systems  Constitutional: Negative for fever.  HENT: Negative for sore throat.   Eyes: Negative for visual disturbance.  Respiratory: Negative for shortness of breath.   Cardiovascular: Negative for chest pain.  Gastrointestinal: Negative for abdominal pain.  Genitourinary: Negative for dysuria.  Musculoskeletal: Negative for back pain.  Skin: Negative for rash.  Neurological: Positive for dizziness and headaches.    Physical Exam Updated Vital Signs BP (!) 152/64   Pulse 70   Temp (!) 97.5 F (36.4 C) (Oral)   Resp 15   Ht 5\' 6"  (1.676 m)   Wt 81.6 kg   SpO2 99%   BMI 29.05 kg/m   Physical Exam Vitals and nursing note reviewed.  Constitutional:      General: She is not in acute distress.    Appearance: She is well-developed.  HENT:     Head: Normocephalic.     Comments: She has a small hematoma to the back of her skull.  No bleeding. Eyes:     Extraocular Movements: Extraocular movements intact.     Conjunctiva/sclera: Conjunctivae normal.     Pupils: Pupils are equal, round, and reactive to light.  Neck:     Comments: She is in a cervical collar.  There is no midline tenderness or step-offs.  Trach midline Cardiovascular:     Rate and Rhythm: Normal rate and regular rhythm.     Heart sounds: No murmur.  Pulmonary:     Effort: Pulmonary effort is normal. No respiratory  distress.     Breath sounds: Normal breath sounds.  Abdominal:     Palpations: Abdomen is soft.     Tenderness: There is no abdominal tenderness.  Musculoskeletal:        General: No tenderness, deformity or signs of injury. Normal range of motion.  Skin:    General: Skin is warm and dry.     Capillary Refill: Capillary refill takes less than 2 seconds.  Neurological:     General: No focal deficit present.     Mental Status: She is alert. Mental status is at baseline.     ED Results / Procedures / Treatments   Labs (all labs ordered are listed, but only abnormal results are displayed) Labs Reviewed  BASIC METABOLIC PANEL - Abnormal; Notable for the following components:      Result Value   Creatinine, Ser 1.10 (*)    Calcium 8.5 (*)    GFR calc non Af Amer 44 (*)    GFR calc Af Amer 51 (*)    All other components within normal limits  URINALYSIS, ROUTINE W REFLEX MICROSCOPIC - Abnormal; Notable for the following components:   Color, Urine STRAW (*)    Specific Gravity, Urine 1.004 (*)    Hgb urine dipstick SMALL (*)    Leukocytes,Ua SMALL (*)    Bacteria, UA RARE (*)    All other components within normal limits  URINE CULTURE  CBC WITH DIFFERENTIAL/PLATELET  PROTIME-INR  SAMPLE TO BLOOD BANK    EKG EKG Interpretation  Date/Time:  Wednesday April 28 2019 15:34:59 EDT Ventricular Rate:  70 PR Interval:    QRS Duration: 97 QT Interval:  403 QTC Calculation: 435 R Axis:   81 Text Interpretation: Sinus rhythm Prolonged PR interval Borderline right axis deviation atrial pacer No old tracing to compare Confirmed by Aletta Edouard [phone]) on 04/28/2019  3:41:24 PM   Radiology CT Head Wo Contrast  Result Date: 04/28/2019 CLINICAL DATA:  Larey Seat backwards out of chair with posterior head strike EXAM: CT HEAD WITHOUT CONTRAST CT CERVICAL SPINE WITHOUT CONTRAST TECHNIQUE: Multidetector CT imaging of the head and cervical spine was performed following the standard protocol  without intravenous contrast. Multiplanar CT image reconstructions of the cervical spine were also generated. COMPARISON:  CT head and cervical spine 02/22/2018 FINDINGS: CT HEAD FINDINGS Brain: No evidence of acute infarction, hemorrhage, hydrocephalus, extra-axial collection or mass lesion/mass effect. Symmetric prominence of the ventricles, cisterns and sulci compatible with parenchymal volume loss. Patchy areas of white matter hypoattenuation are most compatible with chronic microvascular angiopathy. Vascular: Atherosclerotic calcification of the carotid siphons. No hyperdense vessel. Skull: Contusive changes to the posterior scalp slightly to the left of midline. No calvarial fracture or suspicious osseous lesion. Sinuses/Orbits: Paranasal sinuses and mastoid air cells are predominantly clear. Orbital structures are unremarkable aside from prior lens extractions. Other: None CT CERVICAL SPINE FINDINGS Alignment: Mild straightening of normal cervical lordosis. Craniocervical and atlantoaxial articulations are maintained. Minimal stepwise anterolisthesis C3-C5 on a likely degenerative basis is overall unchanged from comparison study. No abnormally widened, jumped or perched facets or other evidence of traumatic listhesis. Skull base and vertebrae: No acute fracture. No primary bone lesion or focal pathologic process. Soft tissues and spinal canal: No pre or paravertebral fluid or swelling. No visible canal hematoma. Disc levels: Multilevel intervertebral disc height loss with spondylitic endplate changes. Features maximal at C5-6, C6-7 where there is at most mild canal stenosis. Minimal uncinate spurring and facet hypertrophic changes most pronounced at C3-4 and C5-6 result in mild bilateral foraminal narrowing at these levels. No severe canal stenosis or neural foraminal narrowing. Upper chest: No acute abnormality in the upper chest or imaged lung apices. Other: Cervical carotid atherosclerosis. Normal thyroid.  IMPRESSION: 1. Contusive changes to the posterior scalp slightly to the left of midline without underlying calvarial fracture or acute intracranial abnormality. 2. Stable parenchymal volume loss and chronic microvascular ischemic changes. 3. No acute fracture or traumatic listhesis of the cervical spine. 4. Multilevel degenerative disc disease and facet hypertrophic changes of the cervical spine, maximal at C5-6, C6-7 where there is at most mild canal stenosis. No severe canal stenosis or neural foraminal narrowing. 5. Cervical and intracranial carotid atherosclerosis. Electronically Signed   By: Kreg Shropshire M.D.   On: 04/28/2019 16:19   CT Cervical Spine Wo Contrast  Result Date: 04/28/2019 CLINICAL DATA:  Larey Seat backwards out of chair with posterior head strike EXAM: CT HEAD WITHOUT CONTRAST CT CERVICAL SPINE WITHOUT CONTRAST TECHNIQUE: Multidetector CT imaging of the head and cervical spine was performed following the standard protocol without intravenous contrast. Multiplanar CT image reconstructions of the cervical spine were also generated. COMPARISON:  CT head and cervical spine 02/22/2018 FINDINGS: CT HEAD FINDINGS Brain: No evidence of acute infarction, hemorrhage, hydrocephalus, extra-axial collection or mass lesion/mass effect. Symmetric prominence of the ventricles, cisterns and sulci compatible with parenchymal volume loss. Patchy areas of white matter hypoattenuation are most compatible with chronic microvascular angiopathy. Vascular: Atherosclerotic calcification of the carotid siphons. No hyperdense vessel. Skull: Contusive changes to the posterior scalp slightly to the left of midline. No calvarial fracture or suspicious osseous lesion. Sinuses/Orbits: Paranasal sinuses and mastoid air cells are predominantly clear. Orbital structures are unremarkable aside from prior lens extractions. Other: None CT CERVICAL SPINE FINDINGS Alignment: Mild straightening of normal cervical lordosis. Craniocervical  and atlantoaxial articulations are maintained. Minimal  stepwise anterolisthesis C3-C5 on a likely degenerative basis is overall unchanged from comparison study. No abnormally widened, jumped or perched facets or other evidence of traumatic listhesis. Skull base and vertebrae: No acute fracture. No primary bone lesion or focal pathologic process. Soft tissues and spinal canal: No pre or paravertebral fluid or swelling. No visible canal hematoma. Disc levels: Multilevel intervertebral disc height loss with spondylitic endplate changes. Features maximal at C5-6, C6-7 where there is at most mild canal stenosis. Minimal uncinate spurring and facet hypertrophic changes most pronounced at C3-4 and C5-6 result in mild bilateral foraminal narrowing at these levels. No severe canal stenosis or neural foraminal narrowing. Upper chest: No acute abnormality in the upper chest or imaged lung apices. Other: Cervical carotid atherosclerosis. Normal thyroid. IMPRESSION: 1. Contusive changes to the posterior scalp slightly to the left of midline without underlying calvarial fracture or acute intracranial abnormality. 2. Stable parenchymal volume loss and chronic microvascular ischemic changes. 3. No acute fracture or traumatic listhesis of the cervical spine. 4. Multilevel degenerative disc disease and facet hypertrophic changes of the cervical spine, maximal at C5-6, C6-7 where there is at most mild canal stenosis. No severe canal stenosis or neural foraminal narrowing. 5. Cervical and intracranial carotid atherosclerosis. Electronically Signed   By: Kreg Shropshire M.D.   On: 04/28/2019 16:19    Procedures Procedures (including critical care time)  Medications Ordered in ED Medications - No data to display  ED Course  I have reviewed the triage vital signs and the nursing notes.  Pertinent labs & imaging results that were available during my care of the patient were reviewed by me and considered in my medical decision  making (see chart for details).  Clinical Course as of Apr 29 931  Wed Apr 28, 2019  7035 Patient was up with assistance and walker and feels back to baseline.  No further dizziness.   [MB]    Clinical Course User Index [MB] Terrilee Files, MD   MDM Rules/Calculators/A&P                     This patient complains of witnessed fall; this involves an extensive number of treatment Options and is a complaint that carries with it a high risk of complications and Morbidity. The differential includes fracture, intracranial bleed cervical fracture derangement, anemia  I ordered, reviewed and interpreted labs, which included CBC with normal white count normal hemoglobin.  INR also 1.1.  Chemistry showing slightly elevated creatinine of 1.1.  Urinalysis with no gross signs of infection I ordered medication IV fluid bolus I ordered imaging studies which included CT head and cervical spine and I independently    visualized and interpreted imaging which showed no gross fractures no intracranial bleed Additional history obtained from son.  Previous records obtained and reviewed in epic  After the interventions stated above, I reevaluated the patient and found the patient to be improved.  She was able to stand and had no further dizziness. Final Clinical Impression(s) / ED Diagnoses Final diagnoses:  Fall, initial encounter  Contusion of scalp, initial encounter    Rx / DC Orders ED Discharge Orders    None       Terrilee Files, MD 04/29/19 [phone]

## 2019-04-28 NOTE — ED Triage Notes (Signed)
Per [facility]: pt tried to get out of a chair. Fell backwards, landed on bottom then fell back onto head. Pt now having posterior head tenderness. Pt has a c-collar in place. Pt given 500 ml saline IV (18 g in L AC)with EMS. Pts initial BP was 130/80, when she sat up she became dizzy, last BP was 119/71. Heart rate 70, CBG 120. Pt on eliquois and has a pacemaker.   [facility], on [address]

## 2019-04-28 NOTE — Telephone Encounter (Signed)
New message:   Deborah Chung is calling from [facility] and needs clarification on the way the pt takes this medication. pregabalin (LYRICA) 75 MG capsule Please advise.

## 2019-04-28 NOTE — Telephone Encounter (Signed)
Called [employer].   They are faxing verification form to [place] to sign.

## 2019-04-29 ENCOUNTER — Encounter (HOSPITAL_COMMUNITY): Payer: Self-pay | Admitting: Emergency Medicine

## 2019-04-29 ENCOUNTER — Encounter: Payer: Self-pay | Admitting: Internal Medicine

## 2019-04-29 DIAGNOSIS — R296 Repeated falls: Secondary | ICD-10-CM | POA: Diagnosis not present

## 2019-04-29 DIAGNOSIS — Z9181 History of falling: Secondary | ICD-10-CM | POA: Diagnosis not present

## 2019-04-29 DIAGNOSIS — M6281 Muscle weakness (generalized): Secondary | ICD-10-CM | POA: Diagnosis not present

## 2019-04-29 DIAGNOSIS — R2681 Unsteadiness on feet: Secondary | ICD-10-CM | POA: Diagnosis not present

## 2019-04-29 DIAGNOSIS — R41841 Cognitive communication deficit: Secondary | ICD-10-CM | POA: Diagnosis not present

## 2019-04-29 DIAGNOSIS — Z20828 Contact with and (suspected) exposure to other viral communicable diseases: Secondary | ICD-10-CM | POA: Diagnosis not present

## 2019-04-29 DIAGNOSIS — M25572 Pain in left ankle and joints of left foot: Secondary | ICD-10-CM | POA: Diagnosis not present

## 2019-04-29 DIAGNOSIS — Z1159 Encounter for screening for other viral diseases: Secondary | ICD-10-CM | POA: Diagnosis not present

## 2019-04-29 DIAGNOSIS — R262 Difficulty in walking, not elsewhere classified: Secondary | ICD-10-CM | POA: Diagnosis not present

## 2019-04-29 DIAGNOSIS — R278 Other lack of coordination: Secondary | ICD-10-CM | POA: Diagnosis not present

## 2019-04-30 DIAGNOSIS — R262 Difficulty in walking, not elsewhere classified: Secondary | ICD-10-CM | POA: Diagnosis not present

## 2019-04-30 DIAGNOSIS — R2681 Unsteadiness on feet: Secondary | ICD-10-CM | POA: Diagnosis not present

## 2019-04-30 DIAGNOSIS — R296 Repeated falls: Secondary | ICD-10-CM | POA: Diagnosis not present

## 2019-04-30 DIAGNOSIS — Z9181 History of falling: Secondary | ICD-10-CM | POA: Diagnosis not present

## 2019-04-30 DIAGNOSIS — R41841 Cognitive communication deficit: Secondary | ICD-10-CM | POA: Diagnosis not present

## 2019-04-30 DIAGNOSIS — M25572 Pain in left ankle and joints of left foot: Secondary | ICD-10-CM | POA: Diagnosis not present

## 2019-04-30 DIAGNOSIS — M6281 Muscle weakness (generalized): Secondary | ICD-10-CM | POA: Diagnosis not present

## 2019-04-30 DIAGNOSIS — R278 Other lack of coordination: Secondary | ICD-10-CM | POA: Diagnosis not present

## 2019-04-30 LAB — URINE CULTURE: Culture: >=100000 — AB

## 2019-05-02 ENCOUNTER — Telehealth: Payer: Self-pay | Admitting: Emergency Medicine

## 2019-05-02 NOTE — Telephone Encounter (Signed)
Post ED Visit - Positive Culture Follow-up  Culture report reviewed by antimicrobial stewardship pharmacist: [facility] []  , Pharm.D. []  Enzo Bi, Pharm.D., BCPS AQ-ID []  , Pharm.D., BCPS []  Celedonio Miyamoto, Pharm.D., BCPS []  Doyle, Garvin Fila.D., BCPS, AAHIVP []  , Pharm.D., BCPS, AAHIVP []  Georgina Pillion, PharmD, BCPS []  , PharmD, BCPS []  [place], PharmD, BCPS []  [address], PharmD []  , PharmD, BCPS [x]  Estella Husk, PharmD  Pharmacy Team []  Lysle Pearl, PharmD []  , PharmD []  Phillips Climes, PharmD []  , Rph []  Agapito Games) , PharmD []  Verlan Friends, PharmD []  , PharmD []  Mervyn Gay, PharmD []  , PharmD []  Daylene Posey, PharmD []  [facility], PharmD []  , PharmD []  Len Childs, PharmD   Positive urine culture No further patient follow-up is required at this time.  05/02/2019, 5:48 PM

## 2019-05-03 DIAGNOSIS — Z1159 Encounter for screening for other viral diseases: Secondary | ICD-10-CM | POA: Diagnosis not present

## 2019-05-03 DIAGNOSIS — R41841 Cognitive communication deficit: Secondary | ICD-10-CM | POA: Diagnosis not present

## 2019-05-03 DIAGNOSIS — R2681 Unsteadiness on feet: Secondary | ICD-10-CM | POA: Diagnosis not present

## 2019-05-03 DIAGNOSIS — R262 Difficulty in walking, not elsewhere classified: Secondary | ICD-10-CM | POA: Diagnosis not present

## 2019-05-03 DIAGNOSIS — M25572 Pain in left ankle and joints of left foot: Secondary | ICD-10-CM | POA: Diagnosis not present

## 2019-05-03 DIAGNOSIS — R278 Other lack of coordination: Secondary | ICD-10-CM | POA: Diagnosis not present

## 2019-05-03 DIAGNOSIS — Z9181 History of falling: Secondary | ICD-10-CM | POA: Diagnosis not present

## 2019-05-03 DIAGNOSIS — M6281 Muscle weakness (generalized): Secondary | ICD-10-CM | POA: Diagnosis not present

## 2019-05-03 DIAGNOSIS — Z20828 Contact with and (suspected) exposure to other viral communicable diseases: Secondary | ICD-10-CM | POA: Diagnosis not present

## 2019-05-03 DIAGNOSIS — R296 Repeated falls: Secondary | ICD-10-CM | POA: Diagnosis not present

## 2019-05-05 DIAGNOSIS — R262 Difficulty in walking, not elsewhere classified: Secondary | ICD-10-CM | POA: Diagnosis not present

## 2019-05-05 DIAGNOSIS — M6281 Muscle weakness (generalized): Secondary | ICD-10-CM | POA: Diagnosis not present

## 2019-05-05 DIAGNOSIS — R296 Repeated falls: Secondary | ICD-10-CM | POA: Diagnosis not present

## 2019-05-05 DIAGNOSIS — R2681 Unsteadiness on feet: Secondary | ICD-10-CM | POA: Diagnosis not present

## 2019-05-05 DIAGNOSIS — Z9181 History of falling: Secondary | ICD-10-CM | POA: Diagnosis not present

## 2019-05-05 DIAGNOSIS — R278 Other lack of coordination: Secondary | ICD-10-CM | POA: Diagnosis not present

## 2019-05-05 DIAGNOSIS — M25572 Pain in left ankle and joints of left foot: Secondary | ICD-10-CM | POA: Diagnosis not present

## 2019-05-05 DIAGNOSIS — R41841 Cognitive communication deficit: Secondary | ICD-10-CM | POA: Diagnosis not present

## 2019-05-06 DIAGNOSIS — R296 Repeated falls: Secondary | ICD-10-CM | POA: Diagnosis not present

## 2019-05-06 DIAGNOSIS — Z20828 Contact with and (suspected) exposure to other viral communicable diseases: Secondary | ICD-10-CM | POA: Diagnosis not present

## 2019-05-06 DIAGNOSIS — Z9181 History of falling: Secondary | ICD-10-CM | POA: Diagnosis not present

## 2019-05-06 DIAGNOSIS — Z1159 Encounter for screening for other viral diseases: Secondary | ICD-10-CM | POA: Diagnosis not present

## 2019-05-06 DIAGNOSIS — R278 Other lack of coordination: Secondary | ICD-10-CM | POA: Diagnosis not present

## 2019-05-06 DIAGNOSIS — M25572 Pain in left ankle and joints of left foot: Secondary | ICD-10-CM | POA: Diagnosis not present

## 2019-05-06 DIAGNOSIS — R41841 Cognitive communication deficit: Secondary | ICD-10-CM | POA: Diagnosis not present

## 2019-05-06 DIAGNOSIS — R2681 Unsteadiness on feet: Secondary | ICD-10-CM | POA: Diagnosis not present

## 2019-05-06 DIAGNOSIS — M6281 Muscle weakness (generalized): Secondary | ICD-10-CM | POA: Diagnosis not present

## 2019-05-06 DIAGNOSIS — R262 Difficulty in walking, not elsewhere classified: Secondary | ICD-10-CM | POA: Diagnosis not present

## 2019-05-07 DIAGNOSIS — M6281 Muscle weakness (generalized): Secondary | ICD-10-CM | POA: Diagnosis not present

## 2019-05-07 DIAGNOSIS — R262 Difficulty in walking, not elsewhere classified: Secondary | ICD-10-CM | POA: Diagnosis not present

## 2019-05-07 DIAGNOSIS — R278 Other lack of coordination: Secondary | ICD-10-CM | POA: Diagnosis not present

## 2019-05-07 DIAGNOSIS — R296 Repeated falls: Secondary | ICD-10-CM | POA: Diagnosis not present

## 2019-05-07 DIAGNOSIS — R41841 Cognitive communication deficit: Secondary | ICD-10-CM | POA: Diagnosis not present

## 2019-05-07 DIAGNOSIS — M25572 Pain in left ankle and joints of left foot: Secondary | ICD-10-CM | POA: Diagnosis not present

## 2019-05-07 DIAGNOSIS — Z9181 History of falling: Secondary | ICD-10-CM | POA: Diagnosis not present

## 2019-05-07 DIAGNOSIS — R2681 Unsteadiness on feet: Secondary | ICD-10-CM | POA: Diagnosis not present

## 2019-05-10 DIAGNOSIS — Z1159 Encounter for screening for other viral diseases: Secondary | ICD-10-CM | POA: Diagnosis not present

## 2019-05-10 DIAGNOSIS — Z20828 Contact with and (suspected) exposure to other viral communicable diseases: Secondary | ICD-10-CM | POA: Diagnosis not present

## 2019-05-10 DIAGNOSIS — Z9181 History of falling: Secondary | ICD-10-CM | POA: Diagnosis not present

## 2019-05-10 DIAGNOSIS — R41841 Cognitive communication deficit: Secondary | ICD-10-CM | POA: Diagnosis not present

## 2019-05-10 DIAGNOSIS — R262 Difficulty in walking, not elsewhere classified: Secondary | ICD-10-CM | POA: Diagnosis not present

## 2019-05-10 DIAGNOSIS — R2681 Unsteadiness on feet: Secondary | ICD-10-CM | POA: Diagnosis not present

## 2019-05-10 DIAGNOSIS — R296 Repeated falls: Secondary | ICD-10-CM | POA: Diagnosis not present

## 2019-05-10 DIAGNOSIS — R278 Other lack of coordination: Secondary | ICD-10-CM | POA: Diagnosis not present

## 2019-05-10 DIAGNOSIS — M6281 Muscle weakness (generalized): Secondary | ICD-10-CM | POA: Diagnosis not present

## 2019-05-10 DIAGNOSIS — M25572 Pain in left ankle and joints of left foot: Secondary | ICD-10-CM | POA: Diagnosis not present

## 2019-05-11 DIAGNOSIS — M6281 Muscle weakness (generalized): Secondary | ICD-10-CM | POA: Diagnosis not present

## 2019-05-11 DIAGNOSIS — Z9181 History of falling: Secondary | ICD-10-CM | POA: Diagnosis not present

## 2019-05-11 DIAGNOSIS — R41841 Cognitive communication deficit: Secondary | ICD-10-CM | POA: Diagnosis not present

## 2019-05-11 DIAGNOSIS — R262 Difficulty in walking, not elsewhere classified: Secondary | ICD-10-CM | POA: Diagnosis not present

## 2019-05-11 DIAGNOSIS — R2681 Unsteadiness on feet: Secondary | ICD-10-CM | POA: Diagnosis not present

## 2019-05-11 DIAGNOSIS — R278 Other lack of coordination: Secondary | ICD-10-CM | POA: Diagnosis not present

## 2019-05-11 DIAGNOSIS — M25572 Pain in left ankle and joints of left foot: Secondary | ICD-10-CM | POA: Diagnosis not present

## 2019-05-11 DIAGNOSIS — R296 Repeated falls: Secondary | ICD-10-CM | POA: Diagnosis not present

## 2019-05-13 ENCOUNTER — Other Ambulatory Visit: Payer: Self-pay | Admitting: Internal Medicine

## 2019-05-13 DIAGNOSIS — Z1159 Encounter for screening for other viral diseases: Secondary | ICD-10-CM | POA: Diagnosis not present

## 2019-05-13 DIAGNOSIS — Z20828 Contact with and (suspected) exposure to other viral communicable diseases: Secondary | ICD-10-CM | POA: Diagnosis not present

## 2019-05-13 DIAGNOSIS — F411 Generalized anxiety disorder: Secondary | ICD-10-CM

## 2019-05-13 MED ORDER — ALPRAZOLAM 0.25 MG PO TABS: ORAL_TABLET | ORAL | 5 refills | Status: DC

## 2019-05-17 DIAGNOSIS — Z1159 Encounter for screening for other viral diseases: Secondary | ICD-10-CM | POA: Diagnosis not present

## 2019-05-17 DIAGNOSIS — R296 Repeated falls: Secondary | ICD-10-CM | POA: Diagnosis not present

## 2019-05-17 DIAGNOSIS — Z20828 Contact with and (suspected) exposure to other viral communicable diseases: Secondary | ICD-10-CM | POA: Diagnosis not present

## 2019-05-17 DIAGNOSIS — M25572 Pain in left ankle and joints of left foot: Secondary | ICD-10-CM | POA: Diagnosis not present

## 2019-05-17 DIAGNOSIS — R262 Difficulty in walking, not elsewhere classified: Secondary | ICD-10-CM | POA: Diagnosis not present

## 2019-05-17 DIAGNOSIS — M6281 Muscle weakness (generalized): Secondary | ICD-10-CM | POA: Diagnosis not present

## 2019-05-17 DIAGNOSIS — R278 Other lack of coordination: Secondary | ICD-10-CM | POA: Diagnosis not present

## 2019-05-17 DIAGNOSIS — Z9181 History of falling: Secondary | ICD-10-CM | POA: Diagnosis not present

## 2019-05-17 DIAGNOSIS — R2681 Unsteadiness on feet: Secondary | ICD-10-CM | POA: Diagnosis not present

## 2019-05-17 DIAGNOSIS — R41841 Cognitive communication deficit: Secondary | ICD-10-CM | POA: Diagnosis not present

## 2019-05-18 DIAGNOSIS — R278 Other lack of coordination: Secondary | ICD-10-CM | POA: Diagnosis not present

## 2019-05-18 DIAGNOSIS — M6281 Muscle weakness (generalized): Secondary | ICD-10-CM | POA: Diagnosis not present

## 2019-05-18 DIAGNOSIS — R41841 Cognitive communication deficit: Secondary | ICD-10-CM | POA: Diagnosis not present

## 2019-05-18 DIAGNOSIS — R262 Difficulty in walking, not elsewhere classified: Secondary | ICD-10-CM | POA: Diagnosis not present

## 2019-05-18 DIAGNOSIS — R2681 Unsteadiness on feet: Secondary | ICD-10-CM | POA: Diagnosis not present

## 2019-05-18 DIAGNOSIS — Z9181 History of falling: Secondary | ICD-10-CM | POA: Diagnosis not present

## 2019-05-18 DIAGNOSIS — R296 Repeated falls: Secondary | ICD-10-CM | POA: Diagnosis not present

## 2019-05-18 DIAGNOSIS — M25572 Pain in left ankle and joints of left foot: Secondary | ICD-10-CM | POA: Diagnosis not present

## 2019-05-20 DIAGNOSIS — M6281 Muscle weakness (generalized): Secondary | ICD-10-CM | POA: Diagnosis not present

## 2019-05-20 DIAGNOSIS — M25572 Pain in left ankle and joints of left foot: Secondary | ICD-10-CM | POA: Diagnosis not present

## 2019-05-20 DIAGNOSIS — R296 Repeated falls: Secondary | ICD-10-CM | POA: Diagnosis not present

## 2019-05-20 DIAGNOSIS — Z20828 Contact with and (suspected) exposure to other viral communicable diseases: Secondary | ICD-10-CM | POA: Diagnosis not present

## 2019-05-20 DIAGNOSIS — Z1159 Encounter for screening for other viral diseases: Secondary | ICD-10-CM | POA: Diagnosis not present

## 2019-05-20 DIAGNOSIS — R2681 Unsteadiness on feet: Secondary | ICD-10-CM | POA: Diagnosis not present

## 2019-05-20 DIAGNOSIS — R41841 Cognitive communication deficit: Secondary | ICD-10-CM | POA: Diagnosis not present

## 2019-05-20 DIAGNOSIS — Z9181 History of falling: Secondary | ICD-10-CM | POA: Diagnosis not present

## 2019-05-20 DIAGNOSIS — R262 Difficulty in walking, not elsewhere classified: Secondary | ICD-10-CM | POA: Diagnosis not present

## 2019-05-20 DIAGNOSIS — R278 Other lack of coordination: Secondary | ICD-10-CM | POA: Diagnosis not present

## 2019-05-21 DIAGNOSIS — Z9181 History of falling: Secondary | ICD-10-CM | POA: Diagnosis not present

## 2019-05-21 DIAGNOSIS — M25572 Pain in left ankle and joints of left foot: Secondary | ICD-10-CM | POA: Diagnosis not present

## 2019-05-21 DIAGNOSIS — H26493 Other secondary cataract, bilateral: Secondary | ICD-10-CM | POA: Diagnosis not present

## 2019-05-21 DIAGNOSIS — H04123 Dry eye syndrome of bilateral lacrimal glands: Secondary | ICD-10-CM | POA: Diagnosis not present

## 2019-05-21 DIAGNOSIS — R296 Repeated falls: Secondary | ICD-10-CM | POA: Diagnosis not present

## 2019-05-21 DIAGNOSIS — R278 Other lack of coordination: Secondary | ICD-10-CM | POA: Diagnosis not present

## 2019-05-21 DIAGNOSIS — R262 Difficulty in walking, not elsewhere classified: Secondary | ICD-10-CM | POA: Diagnosis not present

## 2019-05-21 DIAGNOSIS — M6281 Muscle weakness (generalized): Secondary | ICD-10-CM | POA: Diagnosis not present

## 2019-05-21 DIAGNOSIS — R41841 Cognitive communication deficit: Secondary | ICD-10-CM | POA: Diagnosis not present

## 2019-05-21 DIAGNOSIS — H43813 Vitreous degeneration, bilateral: Secondary | ICD-10-CM | POA: Diagnosis not present

## 2019-05-21 DIAGNOSIS — R2681 Unsteadiness on feet: Secondary | ICD-10-CM | POA: Diagnosis not present

## 2019-05-21 DIAGNOSIS — H52203 Unspecified astigmatism, bilateral: Secondary | ICD-10-CM | POA: Diagnosis not present

## 2019-05-24 DIAGNOSIS — R278 Other lack of coordination: Secondary | ICD-10-CM | POA: Diagnosis not present

## 2019-05-24 DIAGNOSIS — R41841 Cognitive communication deficit: Secondary | ICD-10-CM | POA: Diagnosis not present

## 2019-05-24 DIAGNOSIS — Z9181 History of falling: Secondary | ICD-10-CM | POA: Diagnosis not present

## 2019-05-24 DIAGNOSIS — Z20828 Contact with and (suspected) exposure to other viral communicable diseases: Secondary | ICD-10-CM | POA: Diagnosis not present

## 2019-05-24 DIAGNOSIS — Z1159 Encounter for screening for other viral diseases: Secondary | ICD-10-CM | POA: Diagnosis not present

## 2019-05-24 DIAGNOSIS — M6281 Muscle weakness (generalized): Secondary | ICD-10-CM | POA: Diagnosis not present

## 2019-05-24 DIAGNOSIS — M25572 Pain in left ankle and joints of left foot: Secondary | ICD-10-CM | POA: Diagnosis not present

## 2019-05-24 DIAGNOSIS — R262 Difficulty in walking, not elsewhere classified: Secondary | ICD-10-CM | POA: Diagnosis not present

## 2019-05-24 DIAGNOSIS — R2681 Unsteadiness on feet: Secondary | ICD-10-CM | POA: Diagnosis not present

## 2019-05-24 DIAGNOSIS — R296 Repeated falls: Secondary | ICD-10-CM | POA: Diagnosis not present

## 2019-05-25 DIAGNOSIS — M6281 Muscle weakness (generalized): Secondary | ICD-10-CM | POA: Diagnosis not present

## 2019-05-25 DIAGNOSIS — M25572 Pain in left ankle and joints of left foot: Secondary | ICD-10-CM | POA: Diagnosis not present

## 2019-05-25 DIAGNOSIS — R2681 Unsteadiness on feet: Secondary | ICD-10-CM | POA: Diagnosis not present

## 2019-05-25 DIAGNOSIS — Z9181 History of falling: Secondary | ICD-10-CM | POA: Diagnosis not present

## 2019-05-25 DIAGNOSIS — R296 Repeated falls: Secondary | ICD-10-CM | POA: Diagnosis not present

## 2019-05-25 DIAGNOSIS — R278 Other lack of coordination: Secondary | ICD-10-CM | POA: Diagnosis not present

## 2019-05-25 DIAGNOSIS — R262 Difficulty in walking, not elsewhere classified: Secondary | ICD-10-CM | POA: Diagnosis not present

## 2019-05-25 DIAGNOSIS — R41841 Cognitive communication deficit: Secondary | ICD-10-CM | POA: Diagnosis not present

## 2019-05-26 ENCOUNTER — Ambulatory Visit (INDEPENDENT_AMBULATORY_CARE_PROVIDER_SITE_OTHER): Payer: Medicare PPO | Admitting: *Deleted

## 2019-05-26 DIAGNOSIS — Z9181 History of falling: Secondary | ICD-10-CM | POA: Diagnosis not present

## 2019-05-26 DIAGNOSIS — I428 Other cardiomyopathies: Secondary | ICD-10-CM

## 2019-05-26 DIAGNOSIS — R2681 Unsteadiness on feet: Secondary | ICD-10-CM | POA: Diagnosis not present

## 2019-05-26 DIAGNOSIS — R262 Difficulty in walking, not elsewhere classified: Secondary | ICD-10-CM | POA: Diagnosis not present

## 2019-05-26 DIAGNOSIS — M25572 Pain in left ankle and joints of left foot: Secondary | ICD-10-CM | POA: Diagnosis not present

## 2019-05-26 DIAGNOSIS — M6281 Muscle weakness (generalized): Secondary | ICD-10-CM | POA: Diagnosis not present

## 2019-05-26 DIAGNOSIS — R41841 Cognitive communication deficit: Secondary | ICD-10-CM | POA: Diagnosis not present

## 2019-05-26 DIAGNOSIS — I480 Paroxysmal atrial fibrillation: Secondary | ICD-10-CM | POA: Diagnosis not present

## 2019-05-26 DIAGNOSIS — R296 Repeated falls: Secondary | ICD-10-CM | POA: Diagnosis not present

## 2019-05-26 DIAGNOSIS — R278 Other lack of coordination: Secondary | ICD-10-CM | POA: Diagnosis not present

## 2019-05-26 LAB — CUP PACEART REMOTE DEVICE CHECK
Battery Remaining Longevity: 30 mo
Battery Remaining Percentage: 53 %
Brady Statistic RA Percent Paced: 89 %
Brady Statistic RV Percent Paced: 2 %
Date Time Interrogation Session: 20210512031100
Implantable Lead Implant Date: 20150112
Implantable Lead Implant Date: 20150112
Implantable Lead Location: 753859
Implantable Lead Location: 753860
Implantable Lead Model: 4135
Implantable Lead Model: 4136
Implantable Lead Serial Number: 29308444
Implantable Lead Serial Number: 29569556
Implantable Pulse Generator Implant Date: 20150112
Lead Channel Impedance Value: 616 Ohm
Lead Channel Impedance Value: 830 Ohm
Lead Channel Pacing Threshold Amplitude: 0.6 V
Lead Channel Pacing Threshold Amplitude: 0.8 V
Lead Channel Pacing Threshold Pulse Width: 0.4 ms
Lead Channel Pacing Threshold Pulse Width: 0.5 ms
Lead Channel Setting Pacing Amplitude: 1.1 V
Lead Channel Setting Pacing Amplitude: 2.4 V
Lead Channel Setting Pacing Pulse Width: 0.4 ms
Lead Channel Setting Sensing Sensitivity: 3 mV
Pulse Gen Serial Number: 391753

## 2019-05-27 DIAGNOSIS — Z1159 Encounter for screening for other viral diseases: Secondary | ICD-10-CM | POA: Diagnosis not present

## 2019-05-27 DIAGNOSIS — R296 Repeated falls: Secondary | ICD-10-CM | POA: Diagnosis not present

## 2019-05-27 DIAGNOSIS — M6281 Muscle weakness (generalized): Secondary | ICD-10-CM | POA: Diagnosis not present

## 2019-05-27 DIAGNOSIS — Z9181 History of falling: Secondary | ICD-10-CM | POA: Diagnosis not present

## 2019-05-27 DIAGNOSIS — Z20828 Contact with and (suspected) exposure to other viral communicable diseases: Secondary | ICD-10-CM | POA: Diagnosis not present

## 2019-05-27 DIAGNOSIS — M25572 Pain in left ankle and joints of left foot: Secondary | ICD-10-CM | POA: Diagnosis not present

## 2019-05-27 DIAGNOSIS — R2681 Unsteadiness on feet: Secondary | ICD-10-CM | POA: Diagnosis not present

## 2019-05-27 DIAGNOSIS — R278 Other lack of coordination: Secondary | ICD-10-CM | POA: Diagnosis not present

## 2019-05-27 DIAGNOSIS — R262 Difficulty in walking, not elsewhere classified: Secondary | ICD-10-CM | POA: Diagnosis not present

## 2019-05-27 DIAGNOSIS — R41841 Cognitive communication deficit: Secondary | ICD-10-CM | POA: Diagnosis not present

## 2019-05-28 NOTE — Progress Notes (Signed)
Remote pacemaker transmission.   

## 2019-05-31 DIAGNOSIS — M6281 Muscle weakness (generalized): Secondary | ICD-10-CM | POA: Diagnosis not present

## 2019-05-31 DIAGNOSIS — R278 Other lack of coordination: Secondary | ICD-10-CM | POA: Diagnosis not present

## 2019-05-31 DIAGNOSIS — Z1159 Encounter for screening for other viral diseases: Secondary | ICD-10-CM | POA: Diagnosis not present

## 2019-05-31 DIAGNOSIS — M25572 Pain in left ankle and joints of left foot: Secondary | ICD-10-CM | POA: Diagnosis not present

## 2019-05-31 DIAGNOSIS — R2681 Unsteadiness on feet: Secondary | ICD-10-CM | POA: Diagnosis not present

## 2019-05-31 DIAGNOSIS — Z20828 Contact with and (suspected) exposure to other viral communicable diseases: Secondary | ICD-10-CM | POA: Diagnosis not present

## 2019-05-31 DIAGNOSIS — R41841 Cognitive communication deficit: Secondary | ICD-10-CM | POA: Diagnosis not present

## 2019-05-31 DIAGNOSIS — R262 Difficulty in walking, not elsewhere classified: Secondary | ICD-10-CM | POA: Diagnosis not present

## 2019-05-31 DIAGNOSIS — R296 Repeated falls: Secondary | ICD-10-CM | POA: Diagnosis not present

## 2019-05-31 DIAGNOSIS — Z9181 History of falling: Secondary | ICD-10-CM | POA: Diagnosis not present

## 2019-06-01 DIAGNOSIS — R2681 Unsteadiness on feet: Secondary | ICD-10-CM | POA: Diagnosis not present

## 2019-06-01 DIAGNOSIS — M6281 Muscle weakness (generalized): Secondary | ICD-10-CM | POA: Diagnosis not present

## 2019-06-01 DIAGNOSIS — R278 Other lack of coordination: Secondary | ICD-10-CM | POA: Diagnosis not present

## 2019-06-01 DIAGNOSIS — R41841 Cognitive communication deficit: Secondary | ICD-10-CM | POA: Diagnosis not present

## 2019-06-01 DIAGNOSIS — R296 Repeated falls: Secondary | ICD-10-CM | POA: Diagnosis not present

## 2019-06-01 DIAGNOSIS — M25572 Pain in left ankle and joints of left foot: Secondary | ICD-10-CM | POA: Diagnosis not present

## 2019-06-01 DIAGNOSIS — R262 Difficulty in walking, not elsewhere classified: Secondary | ICD-10-CM | POA: Diagnosis not present

## 2019-06-01 DIAGNOSIS — Z9181 History of falling: Secondary | ICD-10-CM | POA: Diagnosis not present

## 2019-06-02 ENCOUNTER — Telehealth: Payer: Self-pay | Admitting: Internal Medicine

## 2019-06-02 DIAGNOSIS — R296 Repeated falls: Secondary | ICD-10-CM | POA: Diagnosis not present

## 2019-06-02 DIAGNOSIS — R262 Difficulty in walking, not elsewhere classified: Secondary | ICD-10-CM | POA: Diagnosis not present

## 2019-06-02 DIAGNOSIS — R278 Other lack of coordination: Secondary | ICD-10-CM | POA: Diagnosis not present

## 2019-06-02 DIAGNOSIS — R2681 Unsteadiness on feet: Secondary | ICD-10-CM | POA: Diagnosis not present

## 2019-06-02 DIAGNOSIS — Z9181 History of falling: Secondary | ICD-10-CM | POA: Diagnosis not present

## 2019-06-02 DIAGNOSIS — M25572 Pain in left ankle and joints of left foot: Secondary | ICD-10-CM | POA: Diagnosis not present

## 2019-06-02 DIAGNOSIS — R41841 Cognitive communication deficit: Secondary | ICD-10-CM | POA: Diagnosis not present

## 2019-06-02 DIAGNOSIS — M6281 Muscle weakness (generalized): Secondary | ICD-10-CM | POA: Diagnosis not present

## 2019-06-02 NOTE — Telephone Encounter (Signed)
   Daughter calling to request order for TUMS be sent to [facility] at  [employer] for her mother Fax [phone] Phone [phone]

## 2019-06-03 DIAGNOSIS — Z1159 Encounter for screening for other viral diseases: Secondary | ICD-10-CM | POA: Diagnosis not present

## 2019-06-03 DIAGNOSIS — Z20828 Contact with and (suspected) exposure to other viral communicable diseases: Secondary | ICD-10-CM | POA: Diagnosis not present

## 2019-06-03 MED ORDER — CALCIUM CARBONATE ANTACID 500 MG PO CHEW: 1.0000 | CHEWABLE_TABLET | Freq: Two times a day (BID) | ORAL | 1 refills | Status: AC | PRN

## 2019-06-03 NOTE — Telephone Encounter (Signed)
rx printed for pcp to sign.   

## 2019-06-04 NOTE — Telephone Encounter (Signed)
Rx faxed to facility. 

## 2019-06-07 DIAGNOSIS — R278 Other lack of coordination: Secondary | ICD-10-CM | POA: Diagnosis not present

## 2019-06-07 DIAGNOSIS — R296 Repeated falls: Secondary | ICD-10-CM | POA: Diagnosis not present

## 2019-06-07 DIAGNOSIS — R262 Difficulty in walking, not elsewhere classified: Secondary | ICD-10-CM | POA: Diagnosis not present

## 2019-06-07 DIAGNOSIS — Z1159 Encounter for screening for other viral diseases: Secondary | ICD-10-CM | POA: Diagnosis not present

## 2019-06-07 DIAGNOSIS — Z9181 History of falling: Secondary | ICD-10-CM | POA: Diagnosis not present

## 2019-06-07 DIAGNOSIS — Z20828 Contact with and (suspected) exposure to other viral communicable diseases: Secondary | ICD-10-CM | POA: Diagnosis not present

## 2019-06-07 DIAGNOSIS — M25572 Pain in left ankle and joints of left foot: Secondary | ICD-10-CM | POA: Diagnosis not present

## 2019-06-07 DIAGNOSIS — M6281 Muscle weakness (generalized): Secondary | ICD-10-CM | POA: Diagnosis not present

## 2019-06-07 DIAGNOSIS — R41841 Cognitive communication deficit: Secondary | ICD-10-CM | POA: Diagnosis not present

## 2019-06-07 DIAGNOSIS — R2681 Unsteadiness on feet: Secondary | ICD-10-CM | POA: Diagnosis not present

## 2019-06-09 DIAGNOSIS — R296 Repeated falls: Secondary | ICD-10-CM | POA: Diagnosis not present

## 2019-06-09 DIAGNOSIS — R2681 Unsteadiness on feet: Secondary | ICD-10-CM | POA: Diagnosis not present

## 2019-06-09 DIAGNOSIS — M6281 Muscle weakness (generalized): Secondary | ICD-10-CM | POA: Diagnosis not present

## 2019-06-09 DIAGNOSIS — Z9181 History of falling: Secondary | ICD-10-CM | POA: Diagnosis not present

## 2019-06-09 DIAGNOSIS — M25572 Pain in left ankle and joints of left foot: Secondary | ICD-10-CM | POA: Diagnosis not present

## 2019-06-09 DIAGNOSIS — R41841 Cognitive communication deficit: Secondary | ICD-10-CM | POA: Diagnosis not present

## 2019-06-09 DIAGNOSIS — R278 Other lack of coordination: Secondary | ICD-10-CM | POA: Diagnosis not present

## 2019-06-09 DIAGNOSIS — R262 Difficulty in walking, not elsewhere classified: Secondary | ICD-10-CM | POA: Diagnosis not present

## 2019-06-10 DIAGNOSIS — Z20828 Contact with and (suspected) exposure to other viral communicable diseases: Secondary | ICD-10-CM | POA: Diagnosis not present

## 2019-06-10 DIAGNOSIS — Z1159 Encounter for screening for other viral diseases: Secondary | ICD-10-CM | POA: Diagnosis not present

## 2019-06-14 DIAGNOSIS — R2681 Unsteadiness on feet: Secondary | ICD-10-CM | POA: Diagnosis not present

## 2019-06-14 DIAGNOSIS — Z20828 Contact with and (suspected) exposure to other viral communicable diseases: Secondary | ICD-10-CM | POA: Diagnosis not present

## 2019-06-14 DIAGNOSIS — M6281 Muscle weakness (generalized): Secondary | ICD-10-CM | POA: Diagnosis not present

## 2019-06-14 DIAGNOSIS — R278 Other lack of coordination: Secondary | ICD-10-CM | POA: Diagnosis not present

## 2019-06-14 DIAGNOSIS — Z1159 Encounter for screening for other viral diseases: Secondary | ICD-10-CM | POA: Diagnosis not present

## 2019-06-14 DIAGNOSIS — R296 Repeated falls: Secondary | ICD-10-CM | POA: Diagnosis not present

## 2019-06-14 DIAGNOSIS — R262 Difficulty in walking, not elsewhere classified: Secondary | ICD-10-CM | POA: Diagnosis not present

## 2019-06-14 DIAGNOSIS — Z9181 History of falling: Secondary | ICD-10-CM | POA: Diagnosis not present

## 2019-06-14 DIAGNOSIS — R41841 Cognitive communication deficit: Secondary | ICD-10-CM | POA: Diagnosis not present

## 2019-06-14 DIAGNOSIS — M25572 Pain in left ankle and joints of left foot: Secondary | ICD-10-CM | POA: Diagnosis not present

## 2019-06-16 DIAGNOSIS — R41841 Cognitive communication deficit: Secondary | ICD-10-CM | POA: Diagnosis not present

## 2019-06-16 DIAGNOSIS — M6281 Muscle weakness (generalized): Secondary | ICD-10-CM | POA: Diagnosis not present

## 2019-06-16 DIAGNOSIS — R278 Other lack of coordination: Secondary | ICD-10-CM | POA: Diagnosis not present

## 2019-06-16 DIAGNOSIS — Z9181 History of falling: Secondary | ICD-10-CM | POA: Diagnosis not present

## 2019-06-17 DIAGNOSIS — Z20828 Contact with and (suspected) exposure to other viral communicable diseases: Secondary | ICD-10-CM | POA: Diagnosis not present

## 2019-06-17 DIAGNOSIS — Z1159 Encounter for screening for other viral diseases: Secondary | ICD-10-CM | POA: Diagnosis not present

## 2019-06-21 DIAGNOSIS — M6281 Muscle weakness (generalized): Secondary | ICD-10-CM | POA: Diagnosis not present

## 2019-06-21 DIAGNOSIS — R278 Other lack of coordination: Secondary | ICD-10-CM | POA: Diagnosis not present

## 2019-06-21 DIAGNOSIS — Z1159 Encounter for screening for other viral diseases: Secondary | ICD-10-CM | POA: Diagnosis not present

## 2019-06-21 DIAGNOSIS — Z20828 Contact with and (suspected) exposure to other viral communicable diseases: Secondary | ICD-10-CM | POA: Diagnosis not present

## 2019-06-21 DIAGNOSIS — Z9181 History of falling: Secondary | ICD-10-CM | POA: Diagnosis not present

## 2019-06-21 DIAGNOSIS — R41841 Cognitive communication deficit: Secondary | ICD-10-CM | POA: Diagnosis not present

## 2019-06-22 DIAGNOSIS — R278 Other lack of coordination: Secondary | ICD-10-CM | POA: Diagnosis not present

## 2019-06-22 DIAGNOSIS — M6281 Muscle weakness (generalized): Secondary | ICD-10-CM | POA: Diagnosis not present

## 2019-06-22 DIAGNOSIS — Z9181 History of falling: Secondary | ICD-10-CM | POA: Diagnosis not present

## 2019-06-22 DIAGNOSIS — R41841 Cognitive communication deficit: Secondary | ICD-10-CM | POA: Diagnosis not present

## 2019-06-24 DIAGNOSIS — Z9181 History of falling: Secondary | ICD-10-CM | POA: Diagnosis not present

## 2019-06-24 DIAGNOSIS — Z20828 Contact with and (suspected) exposure to other viral communicable diseases: Secondary | ICD-10-CM | POA: Diagnosis not present

## 2019-06-24 DIAGNOSIS — R41841 Cognitive communication deficit: Secondary | ICD-10-CM | POA: Diagnosis not present

## 2019-06-24 DIAGNOSIS — R278 Other lack of coordination: Secondary | ICD-10-CM | POA: Diagnosis not present

## 2019-06-24 DIAGNOSIS — M6281 Muscle weakness (generalized): Secondary | ICD-10-CM | POA: Diagnosis not present

## 2019-06-24 DIAGNOSIS — Z1159 Encounter for screening for other viral diseases: Secondary | ICD-10-CM | POA: Diagnosis not present

## 2019-06-28 DIAGNOSIS — Z20828 Contact with and (suspected) exposure to other viral communicable diseases: Secondary | ICD-10-CM | POA: Diagnosis not present

## 2019-06-28 DIAGNOSIS — Z1159 Encounter for screening for other viral diseases: Secondary | ICD-10-CM | POA: Diagnosis not present

## 2019-06-30 DIAGNOSIS — Z9181 History of falling: Secondary | ICD-10-CM | POA: Diagnosis not present

## 2019-06-30 DIAGNOSIS — R278 Other lack of coordination: Secondary | ICD-10-CM | POA: Diagnosis not present

## 2019-06-30 DIAGNOSIS — M6281 Muscle weakness (generalized): Secondary | ICD-10-CM | POA: Diagnosis not present

## 2019-06-30 DIAGNOSIS — R41841 Cognitive communication deficit: Secondary | ICD-10-CM | POA: Diagnosis not present

## 2019-07-01 DIAGNOSIS — Z20828 Contact with and (suspected) exposure to other viral communicable diseases: Secondary | ICD-10-CM | POA: Diagnosis not present

## 2019-07-01 DIAGNOSIS — Z1159 Encounter for screening for other viral diseases: Secondary | ICD-10-CM | POA: Diagnosis not present

## 2019-07-02 DIAGNOSIS — M6281 Muscle weakness (generalized): Secondary | ICD-10-CM | POA: Diagnosis not present

## 2019-07-02 DIAGNOSIS — R278 Other lack of coordination: Secondary | ICD-10-CM | POA: Diagnosis not present

## 2019-07-02 DIAGNOSIS — R41841 Cognitive communication deficit: Secondary | ICD-10-CM | POA: Diagnosis not present

## 2019-07-02 DIAGNOSIS — Z9181 History of falling: Secondary | ICD-10-CM | POA: Diagnosis not present

## 2019-07-05 DIAGNOSIS — R278 Other lack of coordination: Secondary | ICD-10-CM | POA: Diagnosis not present

## 2019-07-05 DIAGNOSIS — Z9181 History of falling: Secondary | ICD-10-CM | POA: Diagnosis not present

## 2019-07-05 DIAGNOSIS — M6281 Muscle weakness (generalized): Secondary | ICD-10-CM | POA: Diagnosis not present

## 2019-07-05 DIAGNOSIS — Z20828 Contact with and (suspected) exposure to other viral communicable diseases: Secondary | ICD-10-CM | POA: Diagnosis not present

## 2019-07-05 DIAGNOSIS — Z1159 Encounter for screening for other viral diseases: Secondary | ICD-10-CM | POA: Diagnosis not present

## 2019-07-05 DIAGNOSIS — R41841 Cognitive communication deficit: Secondary | ICD-10-CM | POA: Diagnosis not present

## 2019-07-08 DIAGNOSIS — Z9181 History of falling: Secondary | ICD-10-CM | POA: Diagnosis not present

## 2019-07-08 DIAGNOSIS — R41841 Cognitive communication deficit: Secondary | ICD-10-CM | POA: Diagnosis not present

## 2019-07-08 DIAGNOSIS — Z1159 Encounter for screening for other viral diseases: Secondary | ICD-10-CM | POA: Diagnosis not present

## 2019-07-08 DIAGNOSIS — M6281 Muscle weakness (generalized): Secondary | ICD-10-CM | POA: Diagnosis not present

## 2019-07-08 DIAGNOSIS — Z20828 Contact with and (suspected) exposure to other viral communicable diseases: Secondary | ICD-10-CM | POA: Diagnosis not present

## 2019-07-08 DIAGNOSIS — R278 Other lack of coordination: Secondary | ICD-10-CM | POA: Diagnosis not present

## 2019-07-12 DIAGNOSIS — Z1159 Encounter for screening for other viral diseases: Secondary | ICD-10-CM | POA: Diagnosis not present

## 2019-07-12 DIAGNOSIS — Z20828 Contact with and (suspected) exposure to other viral communicable diseases: Secondary | ICD-10-CM | POA: Diagnosis not present

## 2019-07-15 DIAGNOSIS — Z1159 Encounter for screening for other viral diseases: Secondary | ICD-10-CM | POA: Diagnosis not present

## 2019-07-15 DIAGNOSIS — Z20828 Contact with and (suspected) exposure to other viral communicable diseases: Secondary | ICD-10-CM | POA: Diagnosis not present

## 2019-07-16 ENCOUNTER — Other Ambulatory Visit: Payer: Self-pay | Admitting: Internal Medicine

## 2019-07-16 DIAGNOSIS — I10 Essential (primary) hypertension: Secondary | ICD-10-CM

## 2019-07-16 DIAGNOSIS — K219 Gastro-esophageal reflux disease without esophagitis: Secondary | ICD-10-CM

## 2019-07-16 DIAGNOSIS — I428 Other cardiomyopathies: Secondary | ICD-10-CM

## 2019-07-16 MED ORDER — ESOMEPRAZOLE MAGNESIUM 20 MG PO CPDR: 20.0000 mg | DELAYED_RELEASE_CAPSULE | Freq: Every day | ORAL | 1 refills | Status: DC

## 2019-07-16 MED ORDER — TORSEMIDE 10 MG PO TABS: 10.0000 mg | ORAL_TABLET | Freq: Every day | ORAL | 1 refills | Status: DC

## 2019-07-19 DIAGNOSIS — Z20828 Contact with and (suspected) exposure to other viral communicable diseases: Secondary | ICD-10-CM | POA: Diagnosis not present

## 2019-07-19 DIAGNOSIS — Z1159 Encounter for screening for other viral diseases: Secondary | ICD-10-CM | POA: Diagnosis not present

## 2019-07-22 DIAGNOSIS — Z1159 Encounter for screening for other viral diseases: Secondary | ICD-10-CM | POA: Diagnosis not present

## 2019-07-22 DIAGNOSIS — Z20828 Contact with and (suspected) exposure to other viral communicable diseases: Secondary | ICD-10-CM | POA: Diagnosis not present

## 2019-07-26 DIAGNOSIS — Z1159 Encounter for screening for other viral diseases: Secondary | ICD-10-CM | POA: Diagnosis not present

## 2019-07-26 DIAGNOSIS — Z20828 Contact with and (suspected) exposure to other viral communicable diseases: Secondary | ICD-10-CM | POA: Diagnosis not present

## 2019-07-29 DIAGNOSIS — Z20828 Contact with and (suspected) exposure to other viral communicable diseases: Secondary | ICD-10-CM | POA: Diagnosis not present

## 2019-07-29 DIAGNOSIS — Z1159 Encounter for screening for other viral diseases: Secondary | ICD-10-CM | POA: Diagnosis not present

## 2019-08-02 DIAGNOSIS — Z20828 Contact with and (suspected) exposure to other viral communicable diseases: Secondary | ICD-10-CM | POA: Diagnosis not present

## 2019-08-02 DIAGNOSIS — Z1159 Encounter for screening for other viral diseases: Secondary | ICD-10-CM | POA: Diagnosis not present

## 2019-08-05 DIAGNOSIS — Z1159 Encounter for screening for other viral diseases: Secondary | ICD-10-CM | POA: Diagnosis not present

## 2019-08-05 DIAGNOSIS — Z20828 Contact with and (suspected) exposure to other viral communicable diseases: Secondary | ICD-10-CM | POA: Diagnosis not present

## 2019-08-09 DIAGNOSIS — Z20828 Contact with and (suspected) exposure to other viral communicable diseases: Secondary | ICD-10-CM | POA: Diagnosis not present

## 2019-08-09 DIAGNOSIS — Z1159 Encounter for screening for other viral diseases: Secondary | ICD-10-CM | POA: Diagnosis not present

## 2019-08-12 DIAGNOSIS — Z1159 Encounter for screening for other viral diseases: Secondary | ICD-10-CM | POA: Diagnosis not present

## 2019-08-12 DIAGNOSIS — Z20828 Contact with and (suspected) exposure to other viral communicable diseases: Secondary | ICD-10-CM | POA: Diagnosis not present

## 2019-08-16 DIAGNOSIS — Z1159 Encounter for screening for other viral diseases: Secondary | ICD-10-CM | POA: Diagnosis not present

## 2019-08-16 DIAGNOSIS — Z20828 Contact with and (suspected) exposure to other viral communicable diseases: Secondary | ICD-10-CM | POA: Diagnosis not present

## 2019-08-19 DIAGNOSIS — Z20828 Contact with and (suspected) exposure to other viral communicable diseases: Secondary | ICD-10-CM | POA: Diagnosis not present

## 2019-08-19 DIAGNOSIS — Z1159 Encounter for screening for other viral diseases: Secondary | ICD-10-CM | POA: Diagnosis not present

## 2019-08-23 DIAGNOSIS — Z1159 Encounter for screening for other viral diseases: Secondary | ICD-10-CM | POA: Diagnosis not present

## 2019-08-23 DIAGNOSIS — Z20828 Contact with and (suspected) exposure to other viral communicable diseases: Secondary | ICD-10-CM | POA: Diagnosis not present

## 2019-08-25 ENCOUNTER — Ambulatory Visit (INDEPENDENT_AMBULATORY_CARE_PROVIDER_SITE_OTHER): Payer: Medicare PPO | Admitting: *Deleted

## 2019-08-25 DIAGNOSIS — I442 Atrioventricular block, complete: Secondary | ICD-10-CM

## 2019-08-25 LAB — CUP PACEART REMOTE DEVICE CHECK
Battery Remaining Longevity: 30 mo
Battery Remaining Percentage: 47 %
Brady Statistic RA Percent Paced: 86 %
Brady Statistic RV Percent Paced: 2 %
Date Time Interrogation Session: 20210811031100
Implantable Lead Implant Date: 20150112
Implantable Lead Implant Date: 20150112
Implantable Lead Location: 753859
Implantable Lead Location: 753860
Implantable Lead Model: 4135
Implantable Lead Model: 4136
Implantable Lead Serial Number: 29308444
Implantable Lead Serial Number: 29569556
Implantable Pulse Generator Implant Date: 20150112
Lead Channel Impedance Value: 580 Ohm
Lead Channel Impedance Value: 855 Ohm
Lead Channel Pacing Threshold Amplitude: 0.7 V
Lead Channel Pacing Threshold Amplitude: 0.8 V
Lead Channel Pacing Threshold Pulse Width: 0.4 ms
Lead Channel Pacing Threshold Pulse Width: 0.5 ms
Lead Channel Setting Pacing Amplitude: 1.1 V
Lead Channel Setting Pacing Amplitude: 2.4 V
Lead Channel Setting Pacing Pulse Width: 0.4 ms
Lead Channel Setting Sensing Sensitivity: 3 mV
Pulse Gen Serial Number: 391753

## 2019-08-26 DIAGNOSIS — Z1159 Encounter for screening for other viral diseases: Secondary | ICD-10-CM | POA: Diagnosis not present

## 2019-08-26 DIAGNOSIS — Z20828 Contact with and (suspected) exposure to other viral communicable diseases: Secondary | ICD-10-CM | POA: Diagnosis not present

## 2019-08-30 DIAGNOSIS — Z20828 Contact with and (suspected) exposure to other viral communicable diseases: Secondary | ICD-10-CM | POA: Diagnosis not present

## 2019-08-30 DIAGNOSIS — Z1159 Encounter for screening for other viral diseases: Secondary | ICD-10-CM | POA: Diagnosis not present

## 2019-08-30 NOTE — Progress Notes (Signed)
Remote pacemaker transmission.   

## 2019-09-02 DIAGNOSIS — Z20828 Contact with and (suspected) exposure to other viral communicable diseases: Secondary | ICD-10-CM | POA: Diagnosis not present

## 2019-09-02 DIAGNOSIS — Z1159 Encounter for screening for other viral diseases: Secondary | ICD-10-CM | POA: Diagnosis not present

## 2019-09-03 ENCOUNTER — Telehealth: Payer: Self-pay | Admitting: Internal Medicine

## 2019-09-03 NOTE — Telephone Encounter (Signed)
LVM for patients son to return call to get patient scheduled for follow up with [facility] from recall list

## 2019-09-06 DIAGNOSIS — Z1159 Encounter for screening for other viral diseases: Secondary | ICD-10-CM | POA: Diagnosis not present

## 2019-09-06 DIAGNOSIS — Z20828 Contact with and (suspected) exposure to other viral communicable diseases: Secondary | ICD-10-CM | POA: Diagnosis not present

## 2019-09-09 DIAGNOSIS — Z1159 Encounter for screening for other viral diseases: Secondary | ICD-10-CM | POA: Diagnosis not present

## 2019-09-09 DIAGNOSIS — Z20828 Contact with and (suspected) exposure to other viral communicable diseases: Secondary | ICD-10-CM | POA: Diagnosis not present

## 2019-09-13 DIAGNOSIS — Z20828 Contact with and (suspected) exposure to other viral communicable diseases: Secondary | ICD-10-CM | POA: Diagnosis not present

## 2019-09-13 DIAGNOSIS — Z1159 Encounter for screening for other viral diseases: Secondary | ICD-10-CM | POA: Diagnosis not present

## 2019-09-16 DIAGNOSIS — Z20828 Contact with and (suspected) exposure to other viral communicable diseases: Secondary | ICD-10-CM | POA: Diagnosis not present

## 2019-09-16 DIAGNOSIS — Z1159 Encounter for screening for other viral diseases: Secondary | ICD-10-CM | POA: Diagnosis not present

## 2019-09-20 DIAGNOSIS — Z20828 Contact with and (suspected) exposure to other viral communicable diseases: Secondary | ICD-10-CM | POA: Diagnosis not present

## 2019-09-20 DIAGNOSIS — Z1159 Encounter for screening for other viral diseases: Secondary | ICD-10-CM | POA: Diagnosis not present

## 2019-09-23 DIAGNOSIS — Z1159 Encounter for screening for other viral diseases: Secondary | ICD-10-CM | POA: Diagnosis not present

## 2019-09-23 DIAGNOSIS — Z20828 Contact with and (suspected) exposure to other viral communicable diseases: Secondary | ICD-10-CM | POA: Diagnosis not present

## 2019-09-27 DIAGNOSIS — Z1159 Encounter for screening for other viral diseases: Secondary | ICD-10-CM | POA: Diagnosis not present

## 2019-09-27 DIAGNOSIS — Z20828 Contact with and (suspected) exposure to other viral communicable diseases: Secondary | ICD-10-CM | POA: Diagnosis not present

## 2019-09-30 DIAGNOSIS — Z1159 Encounter for screening for other viral diseases: Secondary | ICD-10-CM | POA: Diagnosis not present

## 2019-09-30 DIAGNOSIS — Z20828 Contact with and (suspected) exposure to other viral communicable diseases: Secondary | ICD-10-CM | POA: Diagnosis not present

## 2019-10-04 DIAGNOSIS — Z1159 Encounter for screening for other viral diseases: Secondary | ICD-10-CM | POA: Diagnosis not present

## 2019-10-04 DIAGNOSIS — Z20828 Contact with and (suspected) exposure to other viral communicable diseases: Secondary | ICD-10-CM | POA: Diagnosis not present

## 2019-10-07 DIAGNOSIS — Z20828 Contact with and (suspected) exposure to other viral communicable diseases: Secondary | ICD-10-CM | POA: Diagnosis not present

## 2019-10-07 DIAGNOSIS — Z1159 Encounter for screening for other viral diseases: Secondary | ICD-10-CM | POA: Diagnosis not present

## 2019-10-11 DIAGNOSIS — Z1159 Encounter for screening for other viral diseases: Secondary | ICD-10-CM | POA: Diagnosis not present

## 2019-10-11 DIAGNOSIS — Z20828 Contact with and (suspected) exposure to other viral communicable diseases: Secondary | ICD-10-CM | POA: Diagnosis not present

## 2019-10-14 DIAGNOSIS — Z1159 Encounter for screening for other viral diseases: Secondary | ICD-10-CM | POA: Diagnosis not present

## 2019-10-14 DIAGNOSIS — Z20828 Contact with and (suspected) exposure to other viral communicable diseases: Secondary | ICD-10-CM | POA: Diagnosis not present

## 2019-10-15 ENCOUNTER — Telehealth: Payer: Self-pay | Admitting: Emergency Medicine

## 2019-10-15 NOTE — Telephone Encounter (Signed)
Deborah Chung at [facility] called and is requesting PT orders due to imbalance and galt problem. Pt needs to be evaluated and treated. She has also had multiple falls. Fax number is [phone]. Thanks

## 2019-10-17 ENCOUNTER — Other Ambulatory Visit: Payer: Self-pay | Admitting: Internal Medicine

## 2019-10-17 DIAGNOSIS — B0229 Other postherpetic nervous system involvement: Secondary | ICD-10-CM

## 2019-10-17 MED ORDER — PREGABALIN 75 MG PO CAPS: 75.0000 mg | ORAL_CAPSULE | Freq: Three times a day (TID) | ORAL | 1 refills | Status: DC

## 2019-10-18 DIAGNOSIS — Z1159 Encounter for screening for other viral diseases: Secondary | ICD-10-CM | POA: Diagnosis not present

## 2019-10-18 DIAGNOSIS — Z20828 Contact with and (suspected) exposure to other viral communicable diseases: Secondary | ICD-10-CM | POA: Diagnosis not present

## 2019-10-18 NOTE — Telephone Encounter (Signed)
Verbal orders given. Deborah Chung said she will send written orders for PT sign off today also.

## 2019-10-21 DIAGNOSIS — Z20828 Contact with and (suspected) exposure to other viral communicable diseases: Secondary | ICD-10-CM | POA: Diagnosis not present

## 2019-10-21 DIAGNOSIS — Z1159 Encounter for screening for other viral diseases: Secondary | ICD-10-CM | POA: Diagnosis not present

## 2019-10-22 ENCOUNTER — Other Ambulatory Visit: Payer: Self-pay | Admitting: Internal Medicine

## 2019-10-22 DIAGNOSIS — B0229 Other postherpetic nervous system involvement: Secondary | ICD-10-CM

## 2019-10-22 MED ORDER — PREGABALIN 75 MG PO CAPS: 75.0000 mg | ORAL_CAPSULE | Freq: Three times a day (TID) | ORAL | 1 refills | Status: DC

## 2019-10-25 DIAGNOSIS — R2689 Other abnormalities of gait and mobility: Secondary | ICD-10-CM | POA: Diagnosis not present

## 2019-10-25 DIAGNOSIS — Z1159 Encounter for screening for other viral diseases: Secondary | ICD-10-CM | POA: Diagnosis not present

## 2019-10-25 DIAGNOSIS — M6281 Muscle weakness (generalized): Secondary | ICD-10-CM | POA: Diagnosis not present

## 2019-10-25 DIAGNOSIS — M25512 Pain in left shoulder: Secondary | ICD-10-CM | POA: Diagnosis not present

## 2019-10-25 DIAGNOSIS — Z9181 History of falling: Secondary | ICD-10-CM | POA: Diagnosis not present

## 2019-10-25 DIAGNOSIS — R488 Other symbolic dysfunctions: Secondary | ICD-10-CM | POA: Diagnosis not present

## 2019-10-25 DIAGNOSIS — R262 Difficulty in walking, not elsewhere classified: Secondary | ICD-10-CM | POA: Diagnosis not present

## 2019-10-25 DIAGNOSIS — R2681 Unsteadiness on feet: Secondary | ICD-10-CM | POA: Diagnosis not present

## 2019-10-25 DIAGNOSIS — Z20828 Contact with and (suspected) exposure to other viral communicable diseases: Secondary | ICD-10-CM | POA: Diagnosis not present

## 2019-10-28 DIAGNOSIS — Z1159 Encounter for screening for other viral diseases: Secondary | ICD-10-CM | POA: Diagnosis not present

## 2019-10-28 DIAGNOSIS — Z20828 Contact with and (suspected) exposure to other viral communicable diseases: Secondary | ICD-10-CM | POA: Diagnosis not present

## 2019-10-29 DIAGNOSIS — Z9181 History of falling: Secondary | ICD-10-CM | POA: Diagnosis not present

## 2019-10-29 DIAGNOSIS — M25512 Pain in left shoulder: Secondary | ICD-10-CM | POA: Diagnosis not present

## 2019-10-29 DIAGNOSIS — R488 Other symbolic dysfunctions: Secondary | ICD-10-CM | POA: Diagnosis not present

## 2019-10-29 DIAGNOSIS — R2689 Other abnormalities of gait and mobility: Secondary | ICD-10-CM | POA: Diagnosis not present

## 2019-10-29 DIAGNOSIS — M6281 Muscle weakness (generalized): Secondary | ICD-10-CM | POA: Diagnosis not present

## 2019-10-29 DIAGNOSIS — R2681 Unsteadiness on feet: Secondary | ICD-10-CM | POA: Diagnosis not present

## 2019-10-29 DIAGNOSIS — R262 Difficulty in walking, not elsewhere classified: Secondary | ICD-10-CM | POA: Diagnosis not present

## 2019-11-01 ENCOUNTER — Ambulatory Visit: Payer: Medicare PPO | Admitting: Internal Medicine

## 2019-11-01 ENCOUNTER — Encounter: Payer: Self-pay | Admitting: Internal Medicine

## 2019-11-01 ENCOUNTER — Other Ambulatory Visit: Payer: Self-pay

## 2019-11-01 VITALS — BP 116/72 | HR 70 | Temp 98.6°F | Resp 16 | Ht 66.0 in | Wt 200.0 lb

## 2019-11-01 DIAGNOSIS — I480 Paroxysmal atrial fibrillation: Secondary | ICD-10-CM

## 2019-11-01 DIAGNOSIS — Z23 Encounter for immunization: Secondary | ICD-10-CM

## 2019-11-01 DIAGNOSIS — I10 Essential (primary) hypertension: Secondary | ICD-10-CM | POA: Diagnosis not present

## 2019-11-01 DIAGNOSIS — Z1159 Encounter for screening for other viral diseases: Secondary | ICD-10-CM | POA: Diagnosis not present

## 2019-11-01 DIAGNOSIS — Z20828 Contact with and (suspected) exposure to other viral communicable diseases: Secondary | ICD-10-CM | POA: Diagnosis not present

## 2019-11-01 NOTE — Progress Notes (Addendum)
Subjective:  Patient ID: Deborah Chung, female    DOB: 07/11/1926  Age: 86 y.o. MRN: [id number]  CC: Hypertension and Atrial Fibrillation  This visit occurred during the SARS-CoV-2 public health emergency.  Safety protocols were in place, including screening questions prior to the visit, additional usage of staff PPE, and extensive cleaning of exam room while observing appropriate contact time as indicated for disinfecting solutions.    HPI Deborah Chung presents for f/up - She returns for follow-up.  She is with her daughter-in-law today.  She continues to complain of chronic, unchanged symptoms including fatigue, weight gain, weakness, and frequent falls.  Patient suffers from  Dementia and CHF which impairs her ability to perform daily activities like toileting, feeding, dressing, grooming, bathing. In the home a cane, walker or crutch will not resolve their issues with performing activities of daily living. A manual wheelchair will allow patient to safely perform daily activities. Patient can safely operate a manual wheelchair in the home. Patient has the mental and physical capacity to follow and operate the device. Patient is willing to use in the home as well as outside of the home. Upper and Lower Extremity Strength is 3/5.    Outpatient Medications Prior to Visit  Medication Sig Dispense Refill  . ALPRAZolam (XANAX) 0.25 MG tablet TAKE 1 TABLET BY MOUTH AS NEEDED FOR ANXIETY 30 tablet 5  . aspirin 81 MG tablet Take 81 mg by mouth daily.    . calcium carbonate (TUMS) 500 MG chewable tablet Chew 1 tablet (200 mg of elemental calcium total) by mouth 2 (two) times daily as needed for indigestion or heartburn. 180 tablet 1  . CREON 36000 units CPEP capsule Take 72,000 Units by mouth 3 (three) times daily before meals.     . diphenoxylate-atropine (LOMOTIL) 2.5-0.025 MG tablet Take 1 tablet by mouth 2 (two) times daily. 60 tablet 11  . DULoxetine (CYMBALTA) 60 MG capsule Take 1 capsule (60  mg total) by mouth daily. 90 capsule 1  . ELIQUIS 2.5 MG TABS tablet TAKE 1 TABLET BY MOUTH TWICE A DAY 180 tablet 1  . ELIQUIS 2.5 MG TABS tablet Take 2.5 mg by mouth 2 (two) times daily.    [facility] esomeprazole (NEXIUM) 20 MG capsule Take 1 capsule (20 mg total) by mouth daily at 12 noon. 90 capsule 1  . famotidine (PEPCID) 20 MG tablet Take 20 mg by mouth daily.    . lipase/protease/amylase (CREON) 36000 UNITS CPEP capsule Take 2 capsules with each meal and 1 capsule with each snack 720 capsule 1  . loratadine (CLARITIN) 10 MG tablet Take 10 mg by mouth daily.    . metoprolol tartrate (LOPRESSOR) 25 MG tablet TAKE 1 TABLET BY MOUTH TWICE A DAY 180 tablet 1  . metoprolol tartrate (LOPRESSOR) 25 MG tablet Take 25 mg by mouth 2 (two) times daily.    . Omega-3 Fatty Acids (OMEGA-3 FISH OIL PO) Take by mouth.    . ondansetron (ZOFRAN) 4 MG tablet Take 4 mg by mouth every 4 (four) hours as needed for nausea or vomiting.    . Polyvinyl Alcohol-Povidone (CLEAR EYES NATURAL TEARS OP) Apply 1 drop to eye every 4 (four) hours as needed (Dry eyes).    . pravastatin (PRAVACHOL) 20 MG tablet TAKE 1 TABLET BY MOUTH EVERY DAY 90 tablet 1  . pregabalin (LYRICA) 75 MG capsule Take 1 capsule (75 mg total) by mouth 3 (three) times daily. 270 capsule 1  . Probiotic Product (PROBIOTIC PO) Take  1 tablet by mouth daily.    . QUEtiapine (SEROQUEL) 50 MG tablet Take 1 tablet (50 mg total) by mouth at bedtime. 90 tablet 1  . saccharomyces boulardii (FLORASTOR) 250 MG capsule Take 250 mg by mouth daily.    [facility] spironolactone (ALDACTONE) 25 MG tablet TAKE 1 TABLET BY MOUTH EVERY DAY 90 tablet 1  . spironolactone (ALDACTONE) 25 MG tablet Take 25 mg by mouth daily.    [facility] torsemide (DEMADEX) 10 MG tablet Take 1 tablet (10 mg total) by mouth daily. 90 tablet 1  . traZODone (DESYREL) 50 MG tablet TAKE 1 TABLET (50 MG TOTAL) BY MOUTH AT BEDTIME AS NEEDED FOR SLEEP. 90 tablet 1  . traZODone (DESYREL) 50 MG tablet Take 50 mg by mouth at  bedtime as needed for sleep.      No facility-administered medications prior to visit.    ROS Review of Systems  Constitutional: Positive for fatigue and unexpected weight change. Negative for appetite change, chills and diaphoresis.  HENT: Positive for postnasal drip and rhinorrhea. Negative for congestion, sinus pressure and sore throat.   Eyes: Negative for visual disturbance.  Respiratory: Negative for cough, chest tightness, shortness of breath and wheezing.   Cardiovascular: Negative for chest pain, palpitations and leg swelling.  Gastrointestinal: Negative for abdominal pain, constipation, diarrhea, nausea and vomiting.  Genitourinary: Negative.  Negative for difficulty urinating.  Musculoskeletal: Positive for gait problem. Negative for back pain and myalgias.  Skin: Negative.  Negative for color change and pallor.  Hematological: Negative for adenopathy. Does not bruise/bleed easily.  Psychiatric/Behavioral: Positive for behavioral problems, confusion, decreased concentration and sleep disturbance. Negative for agitation, dysphoric mood, self-injury and suicidal ideas. The patient is not nervous/anxious.     Objective:  BP 116/72   Pulse 70   Temp 98.6 F (37 C) (Oral)   Resp 16   Ht 5\' 6"  (1.676 m)   Wt 200 lb (90.7 kg)   SpO2 96%   BMI 32.28 kg/m   BP Readings from Last 3 Encounters:  11/01/19 116/72  04/28/19 (!) 152/64  04/21/19 110/60    Wt Readings from Last 3 Encounters:  11/01/19 200 lb (90.7 kg)  04/28/19 180 lb (81.6 kg)  04/21/19 179 lb (81.2 kg)    Physical Exam Vitals reviewed.  Constitutional:      Appearance: She is obese. She is ill-appearing (in a wheelchair).  HENT:     Nose: Nose normal.     Mouth/Throat:     Mouth: Mucous membranes are moist.  Eyes:     General: No scleral icterus.    Conjunctiva/sclera: Conjunctivae normal.  Cardiovascular:     Rate and Rhythm: Normal rate and regular rhythm.     Heart sounds: No murmur heard.    Pulmonary:     Effort: Pulmonary effort is normal.     Breath sounds: No stridor. No wheezing, rhonchi or rales.  Abdominal:     General: Abdomen is protuberant. Bowel sounds are normal. There is no distension.     Palpations: Abdomen is soft. There is no hepatomegaly, splenomegaly or mass.     Tenderness: There is no abdominal tenderness.  Musculoskeletal:        General: No swelling or tenderness. Normal range of motion.     Cervical back: Neck supple.     Right lower leg: No edema.     Left lower leg: No edema.  Lymphadenopathy:     Cervical: No cervical adenopathy.  Skin:    General:  Skin is warm.     Coloration: Skin is not pale.  Neurological:     General: No focal deficit present.     Lab Results  Component Value Date   WBC 6.2 04/28/2019   HGB 13.2 04/28/2019   HCT 41.7 04/28/2019   PLT 186 04/28/2019   GLUCOSE 95 04/28/2019   CHOL 192 04/21/2019   TRIG 198.0 (H) 04/21/2019   HDL 43.40 04/21/2019   LDLDIRECT 106.0 05/16/2014   LDLCALC 109 (H) 04/21/2019   ALT 11 04/21/2019   AST 14 04/21/2019   NA 137 04/28/2019   K 4.9 04/28/2019   CL 105 04/28/2019   CREATININE 1.10 (H) 04/28/2019   BUN 21 04/28/2019   CO2 26 04/28/2019   TSH 1.45 04/21/2019   INR 1.1 04/28/2019   HGBA1C 5.6 04/21/2019    CT Head Wo Contrast  Result Date: 04/28/2019 CLINICAL DATA:  Larey Seat backwards out of chair with posterior head strike EXAM: CT HEAD WITHOUT CONTRAST CT CERVICAL SPINE WITHOUT CONTRAST TECHNIQUE: Multidetector CT imaging of the head and cervical spine was performed following the standard protocol without intravenous contrast. Multiplanar CT image reconstructions of the cervical spine were also generated. COMPARISON:  CT head and cervical spine 02/22/2018 FINDINGS: CT HEAD FINDINGS Brain: No evidence of acute infarction, hemorrhage, hydrocephalus, extra-axial collection or mass lesion/mass effect. Symmetric prominence of the ventricles, cisterns and sulci compatible with  parenchymal volume loss. Patchy areas of white matter hypoattenuation are most compatible with chronic microvascular angiopathy. Vascular: Atherosclerotic calcification of the carotid siphons. No hyperdense vessel. Skull: Contusive changes to the posterior scalp slightly to the left of midline. No calvarial fracture or suspicious osseous lesion. Sinuses/Orbits: Paranasal sinuses and mastoid air cells are predominantly clear. Orbital structures are unremarkable aside from prior lens extractions. Other: None CT CERVICAL SPINE FINDINGS Alignment: Mild straightening of normal cervical lordosis. Craniocervical and atlantoaxial articulations are maintained. Minimal stepwise anterolisthesis C3-C5 on a likely degenerative basis is overall unchanged from comparison study. No abnormally widened, jumped or perched facets or other evidence of traumatic listhesis. Skull base and vertebrae: No acute fracture. No primary bone lesion or focal pathologic process. Soft tissues and spinal canal: No pre or paravertebral fluid or swelling. No visible canal hematoma. Disc levels: Multilevel intervertebral disc height loss with spondylitic endplate changes. Features maximal at C5-6, C6-7 where there is at most mild canal stenosis. Minimal uncinate spurring and facet hypertrophic changes most pronounced at C3-4 and C5-6 result in mild bilateral foraminal narrowing at these levels. No severe canal stenosis or neural foraminal narrowing. Upper chest: No acute abnormality in the upper chest or imaged lung apices. Other: Cervical carotid atherosclerosis. Normal thyroid. IMPRESSION: 1. Contusive changes to the posterior scalp slightly to the left of midline without underlying calvarial fracture or acute intracranial abnormality. 2. Stable parenchymal volume loss and chronic microvascular ischemic changes. 3. No acute fracture or traumatic listhesis of the cervical spine. 4. Multilevel degenerative disc disease and facet hypertrophic changes of  the cervical spine, maximal at C5-6, C6-7 where there is at most mild canal stenosis. No severe canal stenosis or neural foraminal narrowing. 5. Cervical and intracranial carotid atherosclerosis. Electronically Signed   By: Kreg Shropshire M.D.   On: 04/28/2019 16:19   CT Cervical Spine Wo Contrast  Result Date: 04/28/2019 CLINICAL DATA:  Larey Seat backwards out of chair with posterior head strike EXAM: CT HEAD WITHOUT CONTRAST CT CERVICAL SPINE WITHOUT CONTRAST TECHNIQUE: Multidetector CT imaging of the head and cervical spine was performed following  the standard protocol without intravenous contrast. Multiplanar CT image reconstructions of the cervical spine were also generated. COMPARISON:  CT head and cervical spine 02/22/2018 FINDINGS: CT HEAD FINDINGS Brain: No evidence of acute infarction, hemorrhage, hydrocephalus, extra-axial collection or mass lesion/mass effect. Symmetric prominence of the ventricles, cisterns and sulci compatible with parenchymal volume loss. Patchy areas of white matter hypoattenuation are most compatible with chronic microvascular angiopathy. Vascular: Atherosclerotic calcification of the carotid siphons. No hyperdense vessel. Skull: Contusive changes to the posterior scalp slightly to the left of midline. No calvarial fracture or suspicious osseous lesion. Sinuses/Orbits: Paranasal sinuses and mastoid air cells are predominantly clear. Orbital structures are unremarkable aside from prior lens extractions. Other: None CT CERVICAL SPINE FINDINGS Alignment: Mild straightening of normal cervical lordosis. Craniocervical and atlantoaxial articulations are maintained. Minimal stepwise anterolisthesis C3-C5 on a likely degenerative basis is overall unchanged from comparison study. No abnormally widened, jumped or perched facets or other evidence of traumatic listhesis. Skull base and vertebrae: No acute fracture. No primary bone lesion or focal pathologic process. Soft tissues and spinal canal:  No pre or paravertebral fluid or swelling. No visible canal hematoma. Disc levels: Multilevel intervertebral disc height loss with spondylitic endplate changes. Features maximal at C5-6, C6-7 where there is at most mild canal stenosis. Minimal uncinate spurring and facet hypertrophic changes most pronounced at C3-4 and C5-6 result in mild bilateral foraminal narrowing at these levels. No severe canal stenosis or neural foraminal narrowing. Upper chest: No acute abnormality in the upper chest or imaged lung apices. Other: Cervical carotid atherosclerosis. Normal thyroid. IMPRESSION: 1. Contusive changes to the posterior scalp slightly to the left of midline without underlying calvarial fracture or acute intracranial abnormality. 2. Stable parenchymal volume loss and chronic microvascular ischemic changes. 3. No acute fracture or traumatic listhesis of the cervical spine. 4. Multilevel degenerative disc disease and facet hypertrophic changes of the cervical spine, maximal at C5-6, C6-7 where there is at most mild canal stenosis. No severe canal stenosis or neural foraminal narrowing. 5. Cervical and intracranial carotid atherosclerosis. Electronically Signed   By: Kreg Shropshire M.D.   On: 04/28/2019 16:19    Assessment & Plan:   Deborah Chung was seen today for hypertension and atrial fibrillation.  Diagnoses and all orders for this visit:  Essential hypertension, benign- Her BP is well controlled  Paroxysmal atrial fibrillation (HCC)- She has good rate/rhythm control. Will cont the DOAC.  Other orders -     Pneumococcal polysaccharide vaccine 23-valent greater than or equal to 2yo subcutaneous/IM   I am having Deborah Chung maintain her Probiotic Product (PROBIOTIC PO), Omega-3 Fatty Acids (OMEGA-3 FISH OIL PO), aspirin, metoprolol tartrate, DULoxetine, pravastatin, traZODone, Eliquis, spironolactone, lipase/protease/amylase, diphenoxylate-atropine, QUEtiapine, ALPRAZolam, calcium carbonate, torsemide,  esomeprazole, Eliquis, famotidine, metoprolol tartrate, Creon, spironolactone, traZODone, loratadine, saccharomyces boulardii, Polyvinyl Alcohol-Povidone (CLEAR EYES NATURAL TEARS OP), ondansetron, and pregabalin.  No orders of the defined types were placed in this encounter.    Follow-up: No follow-ups on file.  Sanda Linger, MD

## 2019-11-01 NOTE — Patient Instructions (Signed)

## 2019-11-02 DIAGNOSIS — R488 Other symbolic dysfunctions: Secondary | ICD-10-CM | POA: Diagnosis not present

## 2019-11-02 DIAGNOSIS — Z9181 History of falling: Secondary | ICD-10-CM | POA: Diagnosis not present

## 2019-11-02 DIAGNOSIS — R2681 Unsteadiness on feet: Secondary | ICD-10-CM | POA: Diagnosis not present

## 2019-11-02 DIAGNOSIS — M6281 Muscle weakness (generalized): Secondary | ICD-10-CM | POA: Diagnosis not present

## 2019-11-02 DIAGNOSIS — M25512 Pain in left shoulder: Secondary | ICD-10-CM | POA: Diagnosis not present

## 2019-11-02 DIAGNOSIS — R2689 Other abnormalities of gait and mobility: Secondary | ICD-10-CM | POA: Diagnosis not present

## 2019-11-02 DIAGNOSIS — R262 Difficulty in walking, not elsewhere classified: Secondary | ICD-10-CM | POA: Diagnosis not present

## 2019-11-03 ENCOUNTER — Telehealth: Payer: Self-pay | Admitting: Internal Medicine

## 2019-11-03 DIAGNOSIS — R488 Other symbolic dysfunctions: Secondary | ICD-10-CM | POA: Diagnosis not present

## 2019-11-03 DIAGNOSIS — M6281 Muscle weakness (generalized): Secondary | ICD-10-CM | POA: Diagnosis not present

## 2019-11-03 DIAGNOSIS — R2681 Unsteadiness on feet: Secondary | ICD-10-CM | POA: Diagnosis not present

## 2019-11-03 DIAGNOSIS — R262 Difficulty in walking, not elsewhere classified: Secondary | ICD-10-CM | POA: Diagnosis not present

## 2019-11-03 DIAGNOSIS — Z9181 History of falling: Secondary | ICD-10-CM | POA: Diagnosis not present

## 2019-11-03 DIAGNOSIS — M25512 Pain in left shoulder: Secondary | ICD-10-CM | POA: Diagnosis not present

## 2019-11-03 DIAGNOSIS — R2689 Other abnormalities of gait and mobility: Secondary | ICD-10-CM | POA: Diagnosis not present

## 2019-11-03 NOTE — Telephone Encounter (Signed)
Deborah Chung from [facility] calling for verbal orders for a evaluation for occupational therapy. Deborah Chung # [phone] Okay to LVM

## 2019-11-03 NOTE — Telephone Encounter (Signed)
Verbal orders given  

## 2019-11-04 DIAGNOSIS — M6281 Muscle weakness (generalized): Secondary | ICD-10-CM | POA: Diagnosis not present

## 2019-11-04 DIAGNOSIS — R488 Other symbolic dysfunctions: Secondary | ICD-10-CM | POA: Diagnosis not present

## 2019-11-04 DIAGNOSIS — R2689 Other abnormalities of gait and mobility: Secondary | ICD-10-CM | POA: Diagnosis not present

## 2019-11-04 DIAGNOSIS — Z9181 History of falling: Secondary | ICD-10-CM | POA: Diagnosis not present

## 2019-11-04 DIAGNOSIS — Z20828 Contact with and (suspected) exposure to other viral communicable diseases: Secondary | ICD-10-CM | POA: Diagnosis not present

## 2019-11-04 DIAGNOSIS — M25512 Pain in left shoulder: Secondary | ICD-10-CM | POA: Diagnosis not present

## 2019-11-04 DIAGNOSIS — Z1159 Encounter for screening for other viral diseases: Secondary | ICD-10-CM | POA: Diagnosis not present

## 2019-11-04 DIAGNOSIS — R2681 Unsteadiness on feet: Secondary | ICD-10-CM | POA: Diagnosis not present

## 2019-11-04 DIAGNOSIS — R262 Difficulty in walking, not elsewhere classified: Secondary | ICD-10-CM | POA: Diagnosis not present

## 2019-11-05 ENCOUNTER — Encounter: Payer: Self-pay | Admitting: Internal Medicine

## 2019-11-05 ENCOUNTER — Ambulatory Visit: Payer: Medicare PPO | Admitting: Internal Medicine

## 2019-11-05 ENCOUNTER — Other Ambulatory Visit: Payer: Self-pay

## 2019-11-05 VITALS — BP 112/56 | HR 70 | Ht 65.0 in | Wt 209.0 lb

## 2019-11-05 DIAGNOSIS — R600 Localized edema: Secondary | ICD-10-CM | POA: Diagnosis not present

## 2019-11-05 DIAGNOSIS — I10 Essential (primary) hypertension: Secondary | ICD-10-CM

## 2019-11-05 DIAGNOSIS — R488 Other symbolic dysfunctions: Secondary | ICD-10-CM | POA: Diagnosis not present

## 2019-11-05 DIAGNOSIS — I480 Paroxysmal atrial fibrillation: Secondary | ICD-10-CM | POA: Diagnosis not present

## 2019-11-05 DIAGNOSIS — M6281 Muscle weakness (generalized): Secondary | ICD-10-CM | POA: Diagnosis not present

## 2019-11-05 DIAGNOSIS — R2681 Unsteadiness on feet: Secondary | ICD-10-CM | POA: Diagnosis not present

## 2019-11-05 DIAGNOSIS — R0609 Other forms of dyspnea: Secondary | ICD-10-CM

## 2019-11-05 DIAGNOSIS — M25512 Pain in left shoulder: Secondary | ICD-10-CM | POA: Diagnosis not present

## 2019-11-05 DIAGNOSIS — I428 Other cardiomyopathies: Secondary | ICD-10-CM

## 2019-11-05 DIAGNOSIS — Z9181 History of falling: Secondary | ICD-10-CM | POA: Diagnosis not present

## 2019-11-05 DIAGNOSIS — R262 Difficulty in walking, not elsewhere classified: Secondary | ICD-10-CM | POA: Diagnosis not present

## 2019-11-05 DIAGNOSIS — R2689 Other abnormalities of gait and mobility: Secondary | ICD-10-CM | POA: Diagnosis not present

## 2019-11-05 DIAGNOSIS — R0600 Dyspnea, unspecified: Secondary | ICD-10-CM | POA: Diagnosis not present

## 2019-11-05 MED ORDER — TORSEMIDE 20 MG PO TABS: 20.0000 mg | ORAL_TABLET | Freq: Every day | ORAL | 1 refills | Status: DC

## 2019-11-05 NOTE — Patient Instructions (Signed)
Medication Instructions:  INCREASE torsemide to 20mg  daily  *If you need a refill on your cardiac medications before your next appointment, please call your pharmacy*   Lab Work: CMET, BNP in 2-3 weeks  If you have labs (blood work) drawn today and your tests are completely normal, you will receive your results only by: MyChart Message (if you have MyChart) OR . A paper copy in the mail If you have any lab test that is abnormal or we need to change your treatment, we will call you to review the results.   Testing/Procedures: Echocardiogram @ [address] - 3rd Floor   Follow-Up: At [facility], you and your health needs are our priority.  As part of our continuing mission to provide you with exceptional heart care, we have created designated Provider Care Teams.  These Care Teams include your primary Cardiologist (physician) and Advanced Practice Providers (APPs -  Physician Assistants and Nurse Practitioners) who all work together to provide you with the care you need, when you need it.  We recommend signing up for the patient portal called "MyChart".  Sign up information is provided on this After Visit Summary.  MyChart is used to connect with patients for Virtual Visits (Telemedicine).  Patients are able to view lab/test results, encounter notes, upcoming appointments, etc.  Non-urgent messages can be sent to your provider as well.   To learn more about what you can do with MyChart, go to [employer].    Your next appointment:   2 month(s)  The format for your next appointment:   In Person  Provider:   You will see Dr. [email or web address] or one of the following Advanced Practice Providers on your designated Care Team:    Rennis Golden, PA-C  Azalee Course, PA-C or   Micah Flesher, Judy Pimple

## 2019-11-05 NOTE — Progress Notes (Signed)
OFFICE NOTE  Chief Complaint:  Bilateral leg swelling  Primary Care Physician: Etta Grandchild, MD  HPI:  Deborah Chung is a pleasant 86 year old female who recently moved to [place] from [place]. She was apparently living in [place], somewhere outside of [place]. Her son is Deborah Chung, who is the in-house counsel for the [facility] system. She was previously seeing [facility] and had an episode in February of 2015. Apparently she had a mobile health screening and was found to be out of rhythm. She was sent to her primary care doctor who did an EKG and noted she was in A. fib and was sent immediately to the hospital. As the story goes, she was on several treatments for her A. fib and apparently eventually underwent cardiac catheterization and/or possibly an ablation procedure for which she developed asystole or some type of arrhythmia requiring her to be resuscitated. Obviously this was successful and she received apparently a pacemaker. She is not clear as to what the type of pacemaker wasn't did not provide documentation today. I've not yet received any records from her cardiologist in [place]. She was placed on Eliquis for anticoagulation. In addition, it appears she is on digoxin and Aldactone along with Lopressor which is suggestive of possible cardiomyopathy. She apparently does have a heart failure diagnosis however I'm not certain as to what her ejection fraction is. She reports a remote check device for her pacemaker and did a remote check apparently last week which went to [place]. Symptomatically she denies any chest pain or shortness of breath.  I saw Deborah Chung back today in the office. She is doing fairly well. She saw Dr. Lewayne Bunting in November who performed a pacemaker check and felt that she was doing well in sinus rhythm. There is no evidence for recurrent atrial fibrillation. She has yet to be enrolled in home monitoring. Twice  she has requested a home monitor but it has not been yet sent to her. I will need to look into why she has not received her home monitoring device. 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 Eyes a chest pain or worsening shortness of breath. As mentioned recent laboratory work from her primary shows excellent cholesterol control with total cholesterol 174, triglycerides 124, HDL 59 and LDL of 90.  At the pleasure seeing Deborah Chung back in the office today. She reports doing fairly well. She still has some issues with swelling in her legs and discomfort with achiness and heaviness. She's had bilateral knee surgeries and is noted to have some varicose veins as well as some discoloration over her ankles. From a cardiac standpoint she had an echo which shows preserved systolic function, this represents a marked improvement since her EF was reported around 10% at one point but this may been after cardiac arrest when she got her pacemaker placed. Pacemaker function appears to be normal. She's had little if any atrial fibrillation and is on Eliquis for a CHADSVASC score of 4.  I saw [name] back today in the office. Overall she is feeling well. She denies any worsening shortness of breath or swelling. EF is now normalized. She follows with Dr. Ladona Ridgel for pacemaker. She's had no bleeding problems on Eliquis. She is on low-dose digoxin which will require monitoring. She is overdue for cholesterol check.  08/21/2015  Deborah Chung returns today for follow-up. In the interim she had a pacer remote check with a stable burden of atrial fibrillation. She  continues to do well on Eliquis. We discontinued her digoxin and her last office visit for a borderline elevated digoxin level and the fact that it is really not indicated for A. fib and her congestive heart failure has improved with normal LV function. Blood pressure is well-controlled today. Her only other complaint is that she's had diarrhea for approximately 7  days. There is no fever or chills associated with that. He came on abruptly. She denied any sick contacts. She is using Imodium for this.  03/05/2016  Deborah Chung returns today for follow-up. Overall she feels well. She had recent adjustment in her pacemaker when she saw Dr. Ladona Ridgel and is atrial paced today is 70. Blood pressure is normal 118/62. She denies any bleeding problems on L a course. She is not aware of her A. fib. Recently she had a bout with diarrhea was found to have a pancreatic insufficiency. She is on Creon.  09/03/2078  Deborah Chung was seen today in follow-up. Her main complaint is bilateral knee pain. She has a history of knee replacement the past but her surgeon now lives in [place]. She's hesitant to do anything different about it. She's not as active as she's been in the past. She continues to have some problems with pancreatic insufficiency and pain associated with that. She denies any chest pain or worsening shortness of breath. Her pacemaker remote checks of been stable. The last was in May which showed a very low burden of A. fib of only 2%.  09/10/2017  Deborah Chung is seen today in follow-up.  Overall she has no significant complaints.  She has had a well-functioning pacemaker which has been followed by Dr. Ladona Ridgel.  Remote pacer checks have shown normal findings.  She still struggles with some GI issues and is followed by Dr. Christella Hartigan.  Blood pressure is well controlled today 110/60.  EKG shows an atrial paced rhythm.  She denies any symptomatic atrial fibrillation.  She has had no bleeding problems on Eliquis and denies any frequent falls or head injuries.  03/14/2018  Deborah Chung is seen today for follow-up.  Recently she has had remote pacer checks which continues showed normal findings.  Her blood pressure is well controlled today.  Her next pacer check is in May.  She had recent labs which showed an LDL of 80, which is reasonable control.  With regards to AF she is on Eliquis  2.5 mg twice daily.  She is also on low-dose aspirin.  We discussed this a little further and I feel that there is no additional indication for that.  She takes pravastatin 20 mg daily.  02/15/2019  Deborah Chung returns for follow-up.  She is accompanied by her daughter-in-law.  She is reporting some left ankle pain and difficulty walking.  She is noted to have somewhat of a shuffling gait and uses a walker.  According to her daughter-in-law she has had some more shortness of breath.  She has had several falls including one last month.  There was one fall that caused some bruising over her head and she had a CT scan which was negative for intracranial bleed since she is on Eliquis.  Her hearing is quite reduced.  Blood pressure is normal today.  Her recent remote check showed normal pacemaker function with a low burden of A. fib of about 3%.  She has a remote history of cardiomyopathy however EF was normal as of last echo.  11/05/2019  Deborah Chung seen today in follow-up.  She is accompanied by her daughter-in-law again.  She has had some worsening lower extremity edema.  She just saw her PCP.  They really did not address this.  She was noted to have well-controlled blood pressure and A. fib which is rate controlled on Eliquis.  Before we talked about shortness of breath.  This is a persistent issue.  I recommended an echo but they were not too excited about doing it mostly because of Covid.  At this point she seems that she would be interested in the echo.  Additionally she is noted to be on some lower dose diuretics.  It was in 2016 which showed normal EF 55 to 60% with some diastolic dysfunction.  PMHx:  Past Medical History:  Diagnosis Date  . Anxiety   . Atrial fibrillation (HCC)   . Blood transfusion without reported diagnosis   . GAD (generalized anxiety disorder)   . Hearing loss   . Hyperlipidemia   . Hypertension   . Pacemaker   . PHN (postherpetic neuralgia) 2010  . Systolic CHF [facility])       Past Surgical History:  Procedure Laterality Date  . ABDOMINAL HYSTERECTOMY    . PACEMAKER PLACEMENT    . TOTAL KNEE ARTHROPLASTY Bilateral     FAMHx:  Family History  Problem Relation Age of Onset  . Heart disease Father   . Heart disease Sister     SOCHx:   reports that she quit smoking about 31 years ago. She quit after 25.00 years of use. She has never used smokeless tobacco. She reports previous alcohol use. She reports that she does not use drugs.  ALLERGIES:  No Known Allergies  ROS: Pertinent items noted in HPI and remainder of comprehensive ROS otherwise negative.  HOME MEDS: Current Outpatient Medications  Medication Sig Dispense Refill  . ALPRAZolam (XANAX) 0.25 MG tablet TAKE 1 TABLET BY MOUTH AS NEEDED FOR ANXIETY 30 tablet 5  . aspirin 81 MG tablet Take 81 mg by mouth daily.    . calcium carbonate (TUMS) 500 MG chewable tablet Chew 1 tablet (200 mg of elemental calcium total) by mouth 2 (two) times daily as needed for indigestion or heartburn. 180 tablet 1  . CREON 36000 units CPEP capsule Take 72,000 Units by mouth 3 (three) times daily before meals.     . diphenoxylate-atropine (LOMOTIL) 2.5-0.025 MG tablet Take 1 tablet by mouth 2 (two) times daily. 60 tablet 11  . DULoxetine (CYMBALTA) 60 MG capsule Take 1 capsule (60 mg total) by mouth daily. 90 capsule 1  . ELIQUIS 2.5 MG TABS tablet Take 2.5 mg by mouth 2 (two) times daily.    Marland Kitchen esomeprazole (NEXIUM) 20 MG capsule Take 1 capsule (20 mg total) by mouth daily at 12 noon. 90 capsule 1  . famotidine (PEPCID) 20 MG tablet Take 20 mg by mouth daily.    . lipase/protease/amylase (CREON) 36000 UNITS CPEP capsule Take 2 capsules with each meal and 1 capsule with each snack 720 capsule 1  . loratadine (CLARITIN) 10 MG tablet Take 10 mg by mouth daily.    . metoprolol tartrate (LOPRESSOR) 25 MG tablet TAKE 1 TABLET BY MOUTH TWICE A DAY 180 tablet 1  . metoprolol tartrate (LOPRESSOR) 25 MG tablet Take 25 mg by  mouth 2 (two) times daily.    . Omega-3 Fatty Acids (OMEGA-3 FISH OIL PO) Take by mouth.    . ondansetron (ZOFRAN) 4 MG tablet Take 4 mg by mouth every 4 (four) hours as  needed for nausea or vomiting.    . Polyvinyl Alcohol-Povidone (CLEAR EYES NATURAL TEARS OP) Apply 1 drop to eye every 4 (four) hours as needed (Dry eyes).    . pravastatin (PRAVACHOL) 20 MG tablet TAKE 1 TABLET BY MOUTH EVERY DAY 90 tablet 1  . pregabalin (LYRICA) 75 MG capsule Take 1 capsule (75 mg total) by mouth 3 (three) times daily. 270 capsule 1  . Probiotic Product (PROBIOTIC PO) Take 1 tablet by mouth daily.    . QUEtiapine (SEROQUEL) 50 MG tablet Take 1 tablet (50 mg total) by mouth at bedtime. 90 tablet 1  . saccharomyces boulardii (FLORASTOR) 250 MG capsule Take 250 mg by mouth daily.    Marland Kitchen spironolactone (ALDACTONE) 25 MG tablet TAKE 1 TABLET BY MOUTH EVERY DAY 90 tablet 1  . spironolactone (ALDACTONE) 25 MG tablet Take 25 mg by mouth daily.    Marland Kitchen torsemide (DEMADEX) 10 MG tablet Take 1 tablet (10 mg total) by mouth daily. 90 tablet 1  . traZODone (DESYREL) 50 MG tablet TAKE 1 TABLET (50 MG TOTAL) BY MOUTH AT BEDTIME AS NEEDED FOR SLEEP. 90 tablet 1  . traZODone (DESYREL) 50 MG tablet Take 50 mg by mouth at bedtime as needed for sleep.      No current facility-administered medications for this visit.    LABS/IMAGING: No results found for this or any previous visit (from the past 48 hour(s)). No results found.  VITALS: BP (!) 112/56 (BP Location: Right Arm, Patient Position: Sitting, Cuff Size: Large)   Pulse 70   Ht 5\' 5"  (1.651 m)   Wt 209 lb (94.8 kg)   BMI 34.78 kg/m   EXAM: General appearance: alert and no distress Neck: no carotid bruit and no JVD Lungs: clear to auscultation bilaterally Heart: regular rate and rhythm, S1, S2 normal, no murmur, click, rub or gallop Abdomen: soft, non-tender; bowel sounds normal; no masses,  no organomegaly and protuberant Extremities: extremities normal,  atraumatic, no cyanosis or edema, edema 2+ bilateral LE edema and varicose veins noted Pulses: 2+ and symmetric Skin: Skin color, texture, turgor normal. No rashes or lesions Neurologic: Grossly normal Psych: Pleasant  EKG: Atrial paced rhythm at 70-personally reviewed  ASSESSMENT: 1. Dyspnea on exertion with lower extremity edema 2. History of atrial fibrillation on anticoagulation with Eliquis (CHADVASC 4) 3. Hypertension 4. Dyslipidemia 5. Status post pacemaker - for sinus node dysfunction and complete heart block 6. Reported history of EF of 10-15%, now improved to 55-60% (2016)  PLAN: 1.  Deborah Chung has had some recent dyspnea and lower extremity edema.  She had a history reported of low EF 10 to 15% however her last echo showed normal LV function in 2016.  I previously recommended an echo for her symptoms last visit however due to Covid they did not want to pursue it.  At this point they are interested in the echo and I would recommend it in addition we will advise increasing her torsemide to 20 mg a day.  We will repeat labs including a BMP and comprehensive metabolic profile in a couple weeks.  Plan follow-up with me after the echo in about 2 months.  2017, MD, [facility], FACP  [place]  [facility]  Medical Director of the [facility]  Cardiovascular [facility] Reduction Clinic Diplomate of the American Board of Clinical Lipidology Attending Cardiologist  Direct Dial: [phone]  Fax: [phone]  Website:  [email or web address] Caedin Mogan 11/05/2019, 3:02 PM

## 2019-11-08 DIAGNOSIS — M6281 Muscle weakness (generalized): Secondary | ICD-10-CM | POA: Diagnosis not present

## 2019-11-08 DIAGNOSIS — R2681 Unsteadiness on feet: Secondary | ICD-10-CM | POA: Diagnosis not present

## 2019-11-08 DIAGNOSIS — Z9181 History of falling: Secondary | ICD-10-CM | POA: Diagnosis not present

## 2019-11-08 DIAGNOSIS — Z20828 Contact with and (suspected) exposure to other viral communicable diseases: Secondary | ICD-10-CM | POA: Diagnosis not present

## 2019-11-08 DIAGNOSIS — M25512 Pain in left shoulder: Secondary | ICD-10-CM | POA: Diagnosis not present

## 2019-11-08 DIAGNOSIS — R488 Other symbolic dysfunctions: Secondary | ICD-10-CM | POA: Diagnosis not present

## 2019-11-08 DIAGNOSIS — R2689 Other abnormalities of gait and mobility: Secondary | ICD-10-CM | POA: Diagnosis not present

## 2019-11-08 DIAGNOSIS — R262 Difficulty in walking, not elsewhere classified: Secondary | ICD-10-CM | POA: Diagnosis not present

## 2019-11-08 DIAGNOSIS — Z1159 Encounter for screening for other viral diseases: Secondary | ICD-10-CM | POA: Diagnosis not present

## 2019-11-09 ENCOUNTER — Telehealth: Payer: Self-pay | Admitting: Internal Medicine

## 2019-11-09 DIAGNOSIS — R262 Difficulty in walking, not elsewhere classified: Secondary | ICD-10-CM | POA: Diagnosis not present

## 2019-11-09 DIAGNOSIS — Z9181 History of falling: Secondary | ICD-10-CM | POA: Diagnosis not present

## 2019-11-09 DIAGNOSIS — R2689 Other abnormalities of gait and mobility: Secondary | ICD-10-CM | POA: Diagnosis not present

## 2019-11-09 DIAGNOSIS — R488 Other symbolic dysfunctions: Secondary | ICD-10-CM | POA: Diagnosis not present

## 2019-11-09 DIAGNOSIS — R2681 Unsteadiness on feet: Secondary | ICD-10-CM | POA: Diagnosis not present

## 2019-11-09 DIAGNOSIS — M6281 Muscle weakness (generalized): Secondary | ICD-10-CM | POA: Diagnosis not present

## 2019-11-09 DIAGNOSIS — M25512 Pain in left shoulder: Secondary | ICD-10-CM | POA: Diagnosis not present

## 2019-11-09 NOTE — Telephone Encounter (Signed)
Deborah Chung with [employer] is requesting a face to face note. She said that [employer] is asking for one in order to get the patients wheelchair ordered.    Phone: [phone] Fax: [phone]

## 2019-11-09 NOTE — Telephone Encounter (Signed)
11/01/19 OV has been printed and faxed to [facility].

## 2019-11-10 ENCOUNTER — Telehealth: Payer: Self-pay | Admitting: Internal Medicine

## 2019-11-10 DIAGNOSIS — R2681 Unsteadiness on feet: Secondary | ICD-10-CM | POA: Diagnosis not present

## 2019-11-10 DIAGNOSIS — R488 Other symbolic dysfunctions: Secondary | ICD-10-CM | POA: Diagnosis not present

## 2019-11-10 DIAGNOSIS — R262 Difficulty in walking, not elsewhere classified: Secondary | ICD-10-CM | POA: Diagnosis not present

## 2019-11-10 DIAGNOSIS — M6281 Muscle weakness (generalized): Secondary | ICD-10-CM | POA: Diagnosis not present

## 2019-11-10 DIAGNOSIS — R2689 Other abnormalities of gait and mobility: Secondary | ICD-10-CM | POA: Diagnosis not present

## 2019-11-10 DIAGNOSIS — M25512 Pain in left shoulder: Secondary | ICD-10-CM | POA: Diagnosis not present

## 2019-11-10 DIAGNOSIS — Z9181 History of falling: Secondary | ICD-10-CM | POA: Diagnosis not present

## 2019-11-10 NOTE — Telephone Encounter (Signed)
Deborah Chung a occupational therapist with [employer] calling to request [place] to send over an addendum to any office note to support medical necessity for her wheelchair, she is having a hard time now even getting to her kitchen. Deborah Leatherwood779 [phone] Fax# [phone]

## 2019-11-11 DIAGNOSIS — M6281 Muscle weakness (generalized): Secondary | ICD-10-CM | POA: Diagnosis not present

## 2019-11-11 DIAGNOSIS — Z1159 Encounter for screening for other viral diseases: Secondary | ICD-10-CM | POA: Diagnosis not present

## 2019-11-11 DIAGNOSIS — R262 Difficulty in walking, not elsewhere classified: Secondary | ICD-10-CM | POA: Diagnosis not present

## 2019-11-11 DIAGNOSIS — Z20828 Contact with and (suspected) exposure to other viral communicable diseases: Secondary | ICD-10-CM | POA: Diagnosis not present

## 2019-11-11 DIAGNOSIS — R488 Other symbolic dysfunctions: Secondary | ICD-10-CM | POA: Diagnosis not present

## 2019-11-11 DIAGNOSIS — Z9181 History of falling: Secondary | ICD-10-CM | POA: Diagnosis not present

## 2019-11-11 DIAGNOSIS — M25512 Pain in left shoulder: Secondary | ICD-10-CM | POA: Diagnosis not present

## 2019-11-11 DIAGNOSIS — R2689 Other abnormalities of gait and mobility: Secondary | ICD-10-CM | POA: Diagnosis not present

## 2019-11-11 DIAGNOSIS — R2681 Unsteadiness on feet: Secondary | ICD-10-CM | POA: Diagnosis not present

## 2019-11-11 NOTE — Telephone Encounter (Signed)
Addendum has been faxed

## 2019-11-12 DIAGNOSIS — R2681 Unsteadiness on feet: Secondary | ICD-10-CM | POA: Diagnosis not present

## 2019-11-12 DIAGNOSIS — R2689 Other abnormalities of gait and mobility: Secondary | ICD-10-CM | POA: Diagnosis not present

## 2019-11-12 DIAGNOSIS — R262 Difficulty in walking, not elsewhere classified: Secondary | ICD-10-CM | POA: Diagnosis not present

## 2019-11-12 DIAGNOSIS — M6281 Muscle weakness (generalized): Secondary | ICD-10-CM | POA: Diagnosis not present

## 2019-11-12 DIAGNOSIS — M25512 Pain in left shoulder: Secondary | ICD-10-CM | POA: Diagnosis not present

## 2019-11-12 DIAGNOSIS — Z9181 History of falling: Secondary | ICD-10-CM | POA: Diagnosis not present

## 2019-11-12 DIAGNOSIS — R488 Other symbolic dysfunctions: Secondary | ICD-10-CM | POA: Diagnosis not present

## 2019-11-15 DIAGNOSIS — Z1159 Encounter for screening for other viral diseases: Secondary | ICD-10-CM | POA: Diagnosis not present

## 2019-11-15 DIAGNOSIS — Z20828 Contact with and (suspected) exposure to other viral communicable diseases: Secondary | ICD-10-CM | POA: Diagnosis not present

## 2019-11-16 DIAGNOSIS — M25512 Pain in left shoulder: Secondary | ICD-10-CM | POA: Diagnosis not present

## 2019-11-16 DIAGNOSIS — Z9181 History of falling: Secondary | ICD-10-CM | POA: Diagnosis not present

## 2019-11-16 DIAGNOSIS — R262 Difficulty in walking, not elsewhere classified: Secondary | ICD-10-CM | POA: Diagnosis not present

## 2019-11-16 DIAGNOSIS — R2689 Other abnormalities of gait and mobility: Secondary | ICD-10-CM | POA: Diagnosis not present

## 2019-11-16 DIAGNOSIS — R2681 Unsteadiness on feet: Secondary | ICD-10-CM | POA: Diagnosis not present

## 2019-11-16 DIAGNOSIS — M6281 Muscle weakness (generalized): Secondary | ICD-10-CM | POA: Diagnosis not present

## 2019-11-16 DIAGNOSIS — R488 Other symbolic dysfunctions: Secondary | ICD-10-CM | POA: Diagnosis not present

## 2019-11-17 DIAGNOSIS — M25512 Pain in left shoulder: Secondary | ICD-10-CM | POA: Diagnosis not present

## 2019-11-17 DIAGNOSIS — R488 Other symbolic dysfunctions: Secondary | ICD-10-CM | POA: Diagnosis not present

## 2019-11-17 DIAGNOSIS — Z9181 History of falling: Secondary | ICD-10-CM | POA: Diagnosis not present

## 2019-11-17 DIAGNOSIS — R2689 Other abnormalities of gait and mobility: Secondary | ICD-10-CM | POA: Diagnosis not present

## 2019-11-17 DIAGNOSIS — M6281 Muscle weakness (generalized): Secondary | ICD-10-CM | POA: Diagnosis not present

## 2019-11-17 DIAGNOSIS — R262 Difficulty in walking, not elsewhere classified: Secondary | ICD-10-CM | POA: Diagnosis not present

## 2019-11-17 DIAGNOSIS — R2681 Unsteadiness on feet: Secondary | ICD-10-CM | POA: Diagnosis not present

## 2019-11-18 ENCOUNTER — Telehealth: Payer: Self-pay | Admitting: Internal Medicine

## 2019-11-18 DIAGNOSIS — R2681 Unsteadiness on feet: Secondary | ICD-10-CM | POA: Diagnosis not present

## 2019-11-18 DIAGNOSIS — Z20828 Contact with and (suspected) exposure to other viral communicable diseases: Secondary | ICD-10-CM | POA: Diagnosis not present

## 2019-11-18 DIAGNOSIS — Z9181 History of falling: Secondary | ICD-10-CM | POA: Diagnosis not present

## 2019-11-18 DIAGNOSIS — R262 Difficulty in walking, not elsewhere classified: Secondary | ICD-10-CM | POA: Diagnosis not present

## 2019-11-18 DIAGNOSIS — Z1159 Encounter for screening for other viral diseases: Secondary | ICD-10-CM | POA: Diagnosis not present

## 2019-11-18 DIAGNOSIS — M6281 Muscle weakness (generalized): Secondary | ICD-10-CM | POA: Diagnosis not present

## 2019-11-18 DIAGNOSIS — R2689 Other abnormalities of gait and mobility: Secondary | ICD-10-CM | POA: Diagnosis not present

## 2019-11-18 DIAGNOSIS — R488 Other symbolic dysfunctions: Secondary | ICD-10-CM | POA: Diagnosis not present

## 2019-11-18 DIAGNOSIS — M25512 Pain in left shoulder: Secondary | ICD-10-CM | POA: Diagnosis not present

## 2019-11-18 NOTE — Telephone Encounter (Signed)
Santina Evans an occupational therapist from [facility] calling stating that Dr. Yetta Barre had faxed over some office visit notes to support the need for a wheelchair but he listed it that she needed a electric wheelchair and Santina Evans stating that the notes need to state a manual wheelchair instead of electric Phone #[phone] Fax#[phone]

## 2019-11-19 DIAGNOSIS — Z9181 History of falling: Secondary | ICD-10-CM | POA: Diagnosis not present

## 2019-11-19 DIAGNOSIS — R2681 Unsteadiness on feet: Secondary | ICD-10-CM | POA: Diagnosis not present

## 2019-11-19 DIAGNOSIS — R2689 Other abnormalities of gait and mobility: Secondary | ICD-10-CM | POA: Diagnosis not present

## 2019-11-19 DIAGNOSIS — M6281 Muscle weakness (generalized): Secondary | ICD-10-CM | POA: Diagnosis not present

## 2019-11-19 DIAGNOSIS — R488 Other symbolic dysfunctions: Secondary | ICD-10-CM | POA: Diagnosis not present

## 2019-11-19 DIAGNOSIS — R262 Difficulty in walking, not elsewhere classified: Secondary | ICD-10-CM | POA: Diagnosis not present

## 2019-11-19 DIAGNOSIS — M25512 Pain in left shoulder: Secondary | ICD-10-CM | POA: Diagnosis not present

## 2019-11-19 NOTE — Telephone Encounter (Signed)
10/18 OV note addendum faxed.

## 2019-11-22 DIAGNOSIS — Z1159 Encounter for screening for other viral diseases: Secondary | ICD-10-CM | POA: Diagnosis not present

## 2019-11-22 DIAGNOSIS — Z20828 Contact with and (suspected) exposure to other viral communicable diseases: Secondary | ICD-10-CM | POA: Diagnosis not present

## 2019-11-23 DIAGNOSIS — R488 Other symbolic dysfunctions: Secondary | ICD-10-CM | POA: Diagnosis not present

## 2019-11-23 DIAGNOSIS — R262 Difficulty in walking, not elsewhere classified: Secondary | ICD-10-CM | POA: Diagnosis not present

## 2019-11-23 DIAGNOSIS — M6281 Muscle weakness (generalized): Secondary | ICD-10-CM | POA: Diagnosis not present

## 2019-11-23 DIAGNOSIS — R2689 Other abnormalities of gait and mobility: Secondary | ICD-10-CM | POA: Diagnosis not present

## 2019-11-23 DIAGNOSIS — R2681 Unsteadiness on feet: Secondary | ICD-10-CM | POA: Diagnosis not present

## 2019-11-23 DIAGNOSIS — M25512 Pain in left shoulder: Secondary | ICD-10-CM | POA: Diagnosis not present

## 2019-11-23 DIAGNOSIS — Z9181 History of falling: Secondary | ICD-10-CM | POA: Diagnosis not present

## 2019-11-24 ENCOUNTER — Ambulatory Visit (INDEPENDENT_AMBULATORY_CARE_PROVIDER_SITE_OTHER): Payer: Medicare PPO

## 2019-11-24 DIAGNOSIS — I480 Paroxysmal atrial fibrillation: Secondary | ICD-10-CM

## 2019-11-24 DIAGNOSIS — R262 Difficulty in walking, not elsewhere classified: Secondary | ICD-10-CM | POA: Diagnosis not present

## 2019-11-24 DIAGNOSIS — Z9181 History of falling: Secondary | ICD-10-CM | POA: Diagnosis not present

## 2019-11-24 DIAGNOSIS — M6281 Muscle weakness (generalized): Secondary | ICD-10-CM | POA: Diagnosis not present

## 2019-11-24 DIAGNOSIS — R2681 Unsteadiness on feet: Secondary | ICD-10-CM | POA: Diagnosis not present

## 2019-11-24 DIAGNOSIS — R2689 Other abnormalities of gait and mobility: Secondary | ICD-10-CM | POA: Diagnosis not present

## 2019-11-24 DIAGNOSIS — R488 Other symbolic dysfunctions: Secondary | ICD-10-CM | POA: Diagnosis not present

## 2019-11-24 DIAGNOSIS — M25512 Pain in left shoulder: Secondary | ICD-10-CM | POA: Diagnosis not present

## 2019-11-24 LAB — CUP PACEART REMOTE DEVICE CHECK
Battery Remaining Longevity: 24 mo
Battery Remaining Percentage: 43 %
Brady Statistic RA Percent Paced: 83 %
Brady Statistic RV Percent Paced: 2 %
Date Time Interrogation Session: 20211110031100
Implantable Lead Implant Date: 20150112
Implantable Lead Implant Date: 20150112
Implantable Lead Location: 753859
Implantable Lead Location: 753860
Implantable Lead Model: 4135
Implantable Lead Model: 4136
Implantable Lead Serial Number: 29308444
Implantable Lead Serial Number: 29569556
Implantable Pulse Generator Implant Date: 20150112
Lead Channel Impedance Value: 583 Ohm
Lead Channel Impedance Value: 871 Ohm
Lead Channel Pacing Threshold Amplitude: 0.7 V
Lead Channel Pacing Threshold Amplitude: 0.8 V
Lead Channel Pacing Threshold Pulse Width: 0.4 ms
Lead Channel Pacing Threshold Pulse Width: 0.5 ms
Lead Channel Setting Pacing Amplitude: 1.1 V
Lead Channel Setting Pacing Amplitude: 2.4 V
Lead Channel Setting Pacing Pulse Width: 0.4 ms
Lead Channel Setting Sensing Sensitivity: 3 mV
Pulse Gen Serial Number: 391753

## 2019-11-25 DIAGNOSIS — Z1159 Encounter for screening for other viral diseases: Secondary | ICD-10-CM | POA: Diagnosis not present

## 2019-11-25 DIAGNOSIS — Z20828 Contact with and (suspected) exposure to other viral communicable diseases: Secondary | ICD-10-CM | POA: Diagnosis not present

## 2019-11-25 NOTE — Progress Notes (Signed)
Remote pacemaker transmission.   

## 2019-11-26 ENCOUNTER — Ambulatory Visit (HOSPITAL_COMMUNITY): Payer: Medicare PPO | Attending: Internal Medicine

## 2019-11-26 ENCOUNTER — Other Ambulatory Visit: Payer: Self-pay

## 2019-11-26 ENCOUNTER — Other Ambulatory Visit: Payer: Medicare PPO | Admitting: *Deleted

## 2019-11-26 ENCOUNTER — Other Ambulatory Visit: Payer: Self-pay | Admitting: Internal Medicine

## 2019-11-26 ENCOUNTER — Other Ambulatory Visit: Payer: Self-pay | Admitting: *Deleted

## 2019-11-26 DIAGNOSIS — R0600 Dyspnea, unspecified: Secondary | ICD-10-CM

## 2019-11-26 DIAGNOSIS — I428 Other cardiomyopathies: Secondary | ICD-10-CM

## 2019-11-26 DIAGNOSIS — I480 Paroxysmal atrial fibrillation: Secondary | ICD-10-CM | POA: Diagnosis not present

## 2019-11-26 DIAGNOSIS — R0609 Other forms of dyspnea: Secondary | ICD-10-CM

## 2019-11-26 DIAGNOSIS — R600 Localized edema: Secondary | ICD-10-CM | POA: Diagnosis not present

## 2019-11-26 LAB — ECHOCARDIOGRAM COMPLETE
Area-P 1/2: 3.72 cm2
S' Lateral: 3 cm

## 2019-11-26 MED ORDER — PERFLUTREN LIPID MICROSPHERE
1.0000 mL | INTRAVENOUS | Status: AC | PRN
  Administered 2019-11-26: 1 mL via INTRAVENOUS

## 2019-11-26 NOTE — Addendum Note (Signed)
Addended by: Baird Lyons on: 11/26/2019 01:55 PM   Modules accepted: Orders

## 2019-11-26 NOTE — Addendum Note (Signed)
Addended by: Baird Lyons on: 11/26/2019 01:53 PM   Modules accepted: Orders

## 2019-11-27 LAB — COMPREHENSIVE METABOLIC PANEL
ALT: 6 IU/L (ref 0–32)
AST: 13 IU/L (ref 0–40)
Albumin/Globulin Ratio: 1.5 (ref 1.2–2.2)
Albumin: 3.9 g/dL (ref 3.5–4.6)
Alkaline Phosphatase: 153 IU/L — ABNORMAL HIGH (ref 44–121)
BUN/Creatinine Ratio: 19 (ref 12–28)
BUN: 25 mg/dL (ref 10–36)
Bilirubin Total: 0.4 mg/dL (ref 0.0–1.2)
CO2: 28 mmol/L (ref 20–29)
Calcium: 9 mg/dL (ref 8.7–10.3)
Chloride: 100 mmol/L (ref 96–106)
Creatinine, Ser: 1.29 mg/dL — ABNORMAL HIGH (ref 0.57–1.00)
GFR calc Af Amer: 42 mL/min/{1.73_m2} — ABNORMAL LOW (ref >59)
GFR calc non Af Amer: 36 mL/min/{1.73_m2} — ABNORMAL LOW (ref >59)
Globulin, Total: 2.6 g/dL (ref 1.5–4.5)
Glucose: 92 mg/dL (ref 65–99)
Potassium: 4.3 mmol/L (ref 3.5–5.2)
Sodium: 141 mmol/L (ref 134–144)
Total Protein: 6.5 g/dL (ref 6.0–8.5)

## 2019-11-27 LAB — PRO B NATRIURETIC PEPTIDE: NT-Pro BNP: 205 pg/mL (ref 0–738)

## 2019-11-29 DIAGNOSIS — Z9181 History of falling: Secondary | ICD-10-CM | POA: Diagnosis not present

## 2019-11-29 DIAGNOSIS — Z1159 Encounter for screening for other viral diseases: Secondary | ICD-10-CM | POA: Diagnosis not present

## 2019-11-29 DIAGNOSIS — R488 Other symbolic dysfunctions: Secondary | ICD-10-CM | POA: Diagnosis not present

## 2019-11-29 DIAGNOSIS — R2689 Other abnormalities of gait and mobility: Secondary | ICD-10-CM | POA: Diagnosis not present

## 2019-11-29 DIAGNOSIS — M25512 Pain in left shoulder: Secondary | ICD-10-CM | POA: Diagnosis not present

## 2019-11-29 DIAGNOSIS — R262 Difficulty in walking, not elsewhere classified: Secondary | ICD-10-CM | POA: Diagnosis not present

## 2019-11-29 DIAGNOSIS — Z20828 Contact with and (suspected) exposure to other viral communicable diseases: Secondary | ICD-10-CM | POA: Diagnosis not present

## 2019-11-29 DIAGNOSIS — R2681 Unsteadiness on feet: Secondary | ICD-10-CM | POA: Diagnosis not present

## 2019-11-29 DIAGNOSIS — M6281 Muscle weakness (generalized): Secondary | ICD-10-CM | POA: Diagnosis not present

## 2019-12-01 DIAGNOSIS — R262 Difficulty in walking, not elsewhere classified: Secondary | ICD-10-CM | POA: Diagnosis not present

## 2019-12-01 DIAGNOSIS — M25512 Pain in left shoulder: Secondary | ICD-10-CM | POA: Diagnosis not present

## 2019-12-01 DIAGNOSIS — R2689 Other abnormalities of gait and mobility: Secondary | ICD-10-CM | POA: Diagnosis not present

## 2019-12-01 DIAGNOSIS — F0390 Unspecified dementia without behavioral disturbance: Secondary | ICD-10-CM | POA: Diagnosis not present

## 2019-12-01 DIAGNOSIS — Z9181 History of falling: Secondary | ICD-10-CM | POA: Diagnosis not present

## 2019-12-01 DIAGNOSIS — I5030 Unspecified diastolic (congestive) heart failure: Secondary | ICD-10-CM | POA: Diagnosis not present

## 2019-12-01 DIAGNOSIS — R2681 Unsteadiness on feet: Secondary | ICD-10-CM | POA: Diagnosis not present

## 2019-12-01 DIAGNOSIS — M6281 Muscle weakness (generalized): Secondary | ICD-10-CM | POA: Diagnosis not present

## 2019-12-01 DIAGNOSIS — R488 Other symbolic dysfunctions: Secondary | ICD-10-CM | POA: Diagnosis not present

## 2019-12-02 DIAGNOSIS — Z1159 Encounter for screening for other viral diseases: Secondary | ICD-10-CM | POA: Diagnosis not present

## 2019-12-02 DIAGNOSIS — B351 Tinea unguium: Secondary | ICD-10-CM | POA: Diagnosis not present

## 2019-12-02 DIAGNOSIS — L84 Corns and callosities: Secondary | ICD-10-CM | POA: Diagnosis not present

## 2019-12-02 DIAGNOSIS — M2011 Hallux valgus (acquired), right foot: Secondary | ICD-10-CM | POA: Diagnosis not present

## 2019-12-02 DIAGNOSIS — I739 Peripheral vascular disease, unspecified: Secondary | ICD-10-CM | POA: Diagnosis not present

## 2019-12-02 DIAGNOSIS — Z20828 Contact with and (suspected) exposure to other viral communicable diseases: Secondary | ICD-10-CM | POA: Diagnosis not present

## 2019-12-02 DIAGNOSIS — M79674 Pain in right toe(s): Secondary | ICD-10-CM | POA: Diagnosis not present

## 2019-12-03 DIAGNOSIS — R2681 Unsteadiness on feet: Secondary | ICD-10-CM | POA: Diagnosis not present

## 2019-12-03 DIAGNOSIS — M6281 Muscle weakness (generalized): Secondary | ICD-10-CM | POA: Diagnosis not present

## 2019-12-03 DIAGNOSIS — M25512 Pain in left shoulder: Secondary | ICD-10-CM | POA: Diagnosis not present

## 2019-12-03 DIAGNOSIS — R488 Other symbolic dysfunctions: Secondary | ICD-10-CM | POA: Diagnosis not present

## 2019-12-03 DIAGNOSIS — R2689 Other abnormalities of gait and mobility: Secondary | ICD-10-CM | POA: Diagnosis not present

## 2019-12-03 DIAGNOSIS — Z9181 History of falling: Secondary | ICD-10-CM | POA: Diagnosis not present

## 2019-12-03 DIAGNOSIS — R262 Difficulty in walking, not elsewhere classified: Secondary | ICD-10-CM | POA: Diagnosis not present

## 2019-12-06 ENCOUNTER — Telehealth: Payer: Self-pay | Admitting: Internal Medicine

## 2019-12-06 DIAGNOSIS — I10 Essential (primary) hypertension: Secondary | ICD-10-CM

## 2019-12-06 DIAGNOSIS — R488 Other symbolic dysfunctions: Secondary | ICD-10-CM | POA: Diagnosis not present

## 2019-12-06 DIAGNOSIS — R2681 Unsteadiness on feet: Secondary | ICD-10-CM | POA: Diagnosis not present

## 2019-12-06 DIAGNOSIS — Z20828 Contact with and (suspected) exposure to other viral communicable diseases: Secondary | ICD-10-CM | POA: Diagnosis not present

## 2019-12-06 DIAGNOSIS — I428 Other cardiomyopathies: Secondary | ICD-10-CM

## 2019-12-06 DIAGNOSIS — M6281 Muscle weakness (generalized): Secondary | ICD-10-CM | POA: Diagnosis not present

## 2019-12-06 DIAGNOSIS — Z9181 History of falling: Secondary | ICD-10-CM | POA: Diagnosis not present

## 2019-12-06 DIAGNOSIS — M25512 Pain in left shoulder: Secondary | ICD-10-CM | POA: Diagnosis not present

## 2019-12-06 DIAGNOSIS — Z1159 Encounter for screening for other viral diseases: Secondary | ICD-10-CM | POA: Diagnosis not present

## 2019-12-06 DIAGNOSIS — R262 Difficulty in walking, not elsewhere classified: Secondary | ICD-10-CM | POA: Diagnosis not present

## 2019-12-06 DIAGNOSIS — R2689 Other abnormalities of gait and mobility: Secondary | ICD-10-CM | POA: Diagnosis not present

## 2019-12-06 MED ORDER — TORSEMIDE 10 MG PO TABS: 10.0000 mg | ORAL_TABLET | Freq: Every day | ORAL | 3 refills | Status: DC

## 2019-12-06 NOTE — Telephone Encounter (Signed)
Patient's son is calling to get patient's test results.

## 2019-12-06 NOTE — Telephone Encounter (Signed)
Left message for pt son to call  

## 2019-12-06 NOTE — Telephone Encounter (Signed)
Patient's son is returning the call to discuss results. He states he is having issues with answering his phone and he would like the call returned to alternate number, [phone].

## 2019-12-06 NOTE — Telephone Encounter (Signed)
Spoke with pt son, aware of the recommendations based on the lab results. New script sent to the pharmacy. Spoke with bukky at the assisted facility where the patient lives. Aware to stop torsemide 20 mg once daily and start torsemide 10 mg by mouth once daily. This note will be faxed to the facility at [phone].

## 2019-12-07 NOTE — Telephone Encounter (Signed)
Bucky with [facility] is returning call regarding order to modify Torsemide. She states they received the order, but they also received a list of all the patient's medications. She wanted to make [place] aware that there are several medications listed that the patient is no longer taking. She composed an updated listed and faxed it to our office. She is requesting to have the list signed by Dr. Rennis Golden and faxed back to their facility.  Phone#: [phone] Fax#: [phone]

## 2019-12-07 NOTE — Telephone Encounter (Signed)
Spoke with [facility]. No medication list has been received yet. Verbalized understanding. Informed facility Dr. Rennis Golden may only sign off on cardiac medications and patient's PCP would need to sign off on the rest.

## 2019-12-08 DIAGNOSIS — R488 Other symbolic dysfunctions: Secondary | ICD-10-CM | POA: Diagnosis not present

## 2019-12-08 DIAGNOSIS — R2681 Unsteadiness on feet: Secondary | ICD-10-CM | POA: Diagnosis not present

## 2019-12-08 DIAGNOSIS — R2689 Other abnormalities of gait and mobility: Secondary | ICD-10-CM | POA: Diagnosis not present

## 2019-12-08 DIAGNOSIS — M25512 Pain in left shoulder: Secondary | ICD-10-CM | POA: Diagnosis not present

## 2019-12-08 DIAGNOSIS — R262 Difficulty in walking, not elsewhere classified: Secondary | ICD-10-CM | POA: Diagnosis not present

## 2019-12-08 DIAGNOSIS — M6281 Muscle weakness (generalized): Secondary | ICD-10-CM | POA: Diagnosis not present

## 2019-12-08 DIAGNOSIS — Z9181 History of falling: Secondary | ICD-10-CM | POA: Diagnosis not present

## 2019-12-10 DIAGNOSIS — Z9181 History of falling: Secondary | ICD-10-CM | POA: Diagnosis not present

## 2019-12-10 DIAGNOSIS — R2681 Unsteadiness on feet: Secondary | ICD-10-CM | POA: Diagnosis not present

## 2019-12-10 DIAGNOSIS — M6281 Muscle weakness (generalized): Secondary | ICD-10-CM | POA: Diagnosis not present

## 2019-12-10 DIAGNOSIS — M25512 Pain in left shoulder: Secondary | ICD-10-CM | POA: Diagnosis not present

## 2019-12-10 DIAGNOSIS — R2689 Other abnormalities of gait and mobility: Secondary | ICD-10-CM | POA: Diagnosis not present

## 2019-12-10 DIAGNOSIS — R488 Other symbolic dysfunctions: Secondary | ICD-10-CM | POA: Diagnosis not present

## 2019-12-10 DIAGNOSIS — R262 Difficulty in walking, not elsewhere classified: Secondary | ICD-10-CM | POA: Diagnosis not present

## 2019-12-13 DIAGNOSIS — M6281 Muscle weakness (generalized): Secondary | ICD-10-CM | POA: Diagnosis not present

## 2019-12-13 DIAGNOSIS — R2681 Unsteadiness on feet: Secondary | ICD-10-CM | POA: Diagnosis not present

## 2019-12-13 DIAGNOSIS — M25512 Pain in left shoulder: Secondary | ICD-10-CM | POA: Diagnosis not present

## 2019-12-13 DIAGNOSIS — R262 Difficulty in walking, not elsewhere classified: Secondary | ICD-10-CM | POA: Diagnosis not present

## 2019-12-13 DIAGNOSIS — R488 Other symbolic dysfunctions: Secondary | ICD-10-CM | POA: Diagnosis not present

## 2019-12-13 DIAGNOSIS — Z9181 History of falling: Secondary | ICD-10-CM | POA: Diagnosis not present

## 2019-12-13 DIAGNOSIS — R2689 Other abnormalities of gait and mobility: Secondary | ICD-10-CM | POA: Diagnosis not present

## 2019-12-13 DIAGNOSIS — Z20828 Contact with and (suspected) exposure to other viral communicable diseases: Secondary | ICD-10-CM | POA: Diagnosis not present

## 2019-12-13 DIAGNOSIS — Z1159 Encounter for screening for other viral diseases: Secondary | ICD-10-CM | POA: Diagnosis not present

## 2019-12-13 NOTE — Telephone Encounter (Signed)
Med list updated per fax received. Faxed back with MD signature

## 2019-12-13 NOTE — Addendum Note (Signed)
Addended by: Lindell Spar on: 12/13/2019 03:13 PM   Modules accepted: Orders

## 2019-12-16 DIAGNOSIS — M25512 Pain in left shoulder: Secondary | ICD-10-CM | POA: Diagnosis not present

## 2019-12-16 DIAGNOSIS — Z20828 Contact with and (suspected) exposure to other viral communicable diseases: Secondary | ICD-10-CM | POA: Diagnosis not present

## 2019-12-16 DIAGNOSIS — R2681 Unsteadiness on feet: Secondary | ICD-10-CM | POA: Diagnosis not present

## 2019-12-16 DIAGNOSIS — Z1159 Encounter for screening for other viral diseases: Secondary | ICD-10-CM | POA: Diagnosis not present

## 2019-12-16 DIAGNOSIS — R2689 Other abnormalities of gait and mobility: Secondary | ICD-10-CM | POA: Diagnosis not present

## 2019-12-16 DIAGNOSIS — M25511 Pain in right shoulder: Secondary | ICD-10-CM | POA: Diagnosis not present

## 2019-12-16 DIAGNOSIS — R488 Other symbolic dysfunctions: Secondary | ICD-10-CM | POA: Diagnosis not present

## 2019-12-16 DIAGNOSIS — R262 Difficulty in walking, not elsewhere classified: Secondary | ICD-10-CM | POA: Diagnosis not present

## 2019-12-16 DIAGNOSIS — M6281 Muscle weakness (generalized): Secondary | ICD-10-CM | POA: Diagnosis not present

## 2019-12-17 DIAGNOSIS — R262 Difficulty in walking, not elsewhere classified: Secondary | ICD-10-CM | POA: Diagnosis not present

## 2019-12-17 DIAGNOSIS — M6281 Muscle weakness (generalized): Secondary | ICD-10-CM | POA: Diagnosis not present

## 2019-12-17 DIAGNOSIS — M25511 Pain in right shoulder: Secondary | ICD-10-CM | POA: Diagnosis not present

## 2019-12-17 DIAGNOSIS — R488 Other symbolic dysfunctions: Secondary | ICD-10-CM | POA: Diagnosis not present

## 2019-12-17 DIAGNOSIS — R2689 Other abnormalities of gait and mobility: Secondary | ICD-10-CM | POA: Diagnosis not present

## 2019-12-17 DIAGNOSIS — R2681 Unsteadiness on feet: Secondary | ICD-10-CM | POA: Diagnosis not present

## 2019-12-17 DIAGNOSIS — M25512 Pain in left shoulder: Secondary | ICD-10-CM | POA: Diagnosis not present

## 2019-12-20 DIAGNOSIS — Z1159 Encounter for screening for other viral diseases: Secondary | ICD-10-CM | POA: Diagnosis not present

## 2019-12-20 DIAGNOSIS — M25511 Pain in right shoulder: Secondary | ICD-10-CM | POA: Diagnosis not present

## 2019-12-20 DIAGNOSIS — R2681 Unsteadiness on feet: Secondary | ICD-10-CM | POA: Diagnosis not present

## 2019-12-20 DIAGNOSIS — M25512 Pain in left shoulder: Secondary | ICD-10-CM | POA: Diagnosis not present

## 2019-12-20 DIAGNOSIS — R2689 Other abnormalities of gait and mobility: Secondary | ICD-10-CM | POA: Diagnosis not present

## 2019-12-20 DIAGNOSIS — R488 Other symbolic dysfunctions: Secondary | ICD-10-CM | POA: Diagnosis not present

## 2019-12-20 DIAGNOSIS — M6281 Muscle weakness (generalized): Secondary | ICD-10-CM | POA: Diagnosis not present

## 2019-12-20 DIAGNOSIS — R262 Difficulty in walking, not elsewhere classified: Secondary | ICD-10-CM | POA: Diagnosis not present

## 2019-12-20 DIAGNOSIS — Z20828 Contact with and (suspected) exposure to other viral communicable diseases: Secondary | ICD-10-CM | POA: Diagnosis not present

## 2019-12-22 DIAGNOSIS — M25511 Pain in right shoulder: Secondary | ICD-10-CM | POA: Diagnosis not present

## 2019-12-22 DIAGNOSIS — R2689 Other abnormalities of gait and mobility: Secondary | ICD-10-CM | POA: Diagnosis not present

## 2019-12-22 DIAGNOSIS — R488 Other symbolic dysfunctions: Secondary | ICD-10-CM | POA: Diagnosis not present

## 2019-12-22 DIAGNOSIS — R2681 Unsteadiness on feet: Secondary | ICD-10-CM | POA: Diagnosis not present

## 2019-12-22 DIAGNOSIS — M6281 Muscle weakness (generalized): Secondary | ICD-10-CM | POA: Diagnosis not present

## 2019-12-22 DIAGNOSIS — R262 Difficulty in walking, not elsewhere classified: Secondary | ICD-10-CM | POA: Diagnosis not present

## 2019-12-22 DIAGNOSIS — M25512 Pain in left shoulder: Secondary | ICD-10-CM | POA: Diagnosis not present

## 2019-12-27 DIAGNOSIS — R2689 Other abnormalities of gait and mobility: Secondary | ICD-10-CM | POA: Diagnosis not present

## 2019-12-27 DIAGNOSIS — M6281 Muscle weakness (generalized): Secondary | ICD-10-CM | POA: Diagnosis not present

## 2019-12-27 DIAGNOSIS — Z20828 Contact with and (suspected) exposure to other viral communicable diseases: Secondary | ICD-10-CM | POA: Diagnosis not present

## 2019-12-27 DIAGNOSIS — R488 Other symbolic dysfunctions: Secondary | ICD-10-CM | POA: Diagnosis not present

## 2019-12-27 DIAGNOSIS — Z1159 Encounter for screening for other viral diseases: Secondary | ICD-10-CM | POA: Diagnosis not present

## 2019-12-27 DIAGNOSIS — R262 Difficulty in walking, not elsewhere classified: Secondary | ICD-10-CM | POA: Diagnosis not present

## 2019-12-27 DIAGNOSIS — R2681 Unsteadiness on feet: Secondary | ICD-10-CM | POA: Diagnosis not present

## 2019-12-27 DIAGNOSIS — M25511 Pain in right shoulder: Secondary | ICD-10-CM | POA: Diagnosis not present

## 2019-12-27 DIAGNOSIS — M25512 Pain in left shoulder: Secondary | ICD-10-CM | POA: Diagnosis not present

## 2019-12-28 DIAGNOSIS — R262 Difficulty in walking, not elsewhere classified: Secondary | ICD-10-CM | POA: Diagnosis not present

## 2019-12-28 DIAGNOSIS — R488 Other symbolic dysfunctions: Secondary | ICD-10-CM | POA: Diagnosis not present

## 2019-12-28 DIAGNOSIS — R2681 Unsteadiness on feet: Secondary | ICD-10-CM | POA: Diagnosis not present

## 2019-12-28 DIAGNOSIS — M25511 Pain in right shoulder: Secondary | ICD-10-CM | POA: Diagnosis not present

## 2019-12-28 DIAGNOSIS — M6281 Muscle weakness (generalized): Secondary | ICD-10-CM | POA: Diagnosis not present

## 2019-12-28 DIAGNOSIS — R2689 Other abnormalities of gait and mobility: Secondary | ICD-10-CM | POA: Diagnosis not present

## 2019-12-28 DIAGNOSIS — M25512 Pain in left shoulder: Secondary | ICD-10-CM | POA: Diagnosis not present

## 2019-12-29 ENCOUNTER — Telehealth: Payer: Self-pay | Admitting: Internal Medicine

## 2019-12-29 ENCOUNTER — Other Ambulatory Visit: Payer: Self-pay | Admitting: Internal Medicine

## 2019-12-29 DIAGNOSIS — F0392 Unspecified dementia, unspecified severity, with psychotic disturbance: Secondary | ICD-10-CM

## 2019-12-29 DIAGNOSIS — F0390 Unspecified dementia without behavioral disturbance: Secondary | ICD-10-CM

## 2019-12-29 MED ORDER — QUETIAPINE FUMARATE 100 MG PO TABS: 100.0000 mg | ORAL_TABLET | Freq: Every day | ORAL | 1 refills | Status: DC

## 2019-12-29 NOTE — Telephone Encounter (Signed)
RX sent

## 2019-12-29 NOTE — Telephone Encounter (Signed)
   Patient's son is requesting QUEtiapine (SEROQUEL)  [facility] [place] [place] [facility] [address]

## 2019-12-29 NOTE — Telephone Encounter (Signed)
Patient's daughter called and said that the patient is getting really agitated and is paranoid. She said that the patient thinks that the people on the TV are talking about her. She was wanting to know if there was anything that could help with the patient agitation. Please call Alyha Marines at [phone]

## 2019-12-29 NOTE — Telephone Encounter (Signed)
LVM with details below 

## 2019-12-29 NOTE — Telephone Encounter (Signed)
We could increase the dose of xanax or seroquel

## 2019-12-30 DIAGNOSIS — M25511 Pain in right shoulder: Secondary | ICD-10-CM | POA: Diagnosis not present

## 2019-12-30 DIAGNOSIS — R2681 Unsteadiness on feet: Secondary | ICD-10-CM | POA: Diagnosis not present

## 2019-12-30 DIAGNOSIS — M25512 Pain in left shoulder: Secondary | ICD-10-CM | POA: Diagnosis not present

## 2019-12-30 DIAGNOSIS — M6281 Muscle weakness (generalized): Secondary | ICD-10-CM | POA: Diagnosis not present

## 2019-12-30 DIAGNOSIS — R262 Difficulty in walking, not elsewhere classified: Secondary | ICD-10-CM | POA: Diagnosis not present

## 2019-12-30 DIAGNOSIS — Z1159 Encounter for screening for other viral diseases: Secondary | ICD-10-CM | POA: Diagnosis not present

## 2019-12-30 DIAGNOSIS — R488 Other symbolic dysfunctions: Secondary | ICD-10-CM | POA: Diagnosis not present

## 2019-12-30 DIAGNOSIS — Z20828 Contact with and (suspected) exposure to other viral communicable diseases: Secondary | ICD-10-CM | POA: Diagnosis not present

## 2019-12-30 DIAGNOSIS — R2689 Other abnormalities of gait and mobility: Secondary | ICD-10-CM | POA: Diagnosis not present

## 2019-12-31 DIAGNOSIS — I5030 Unspecified diastolic (congestive) heart failure: Secondary | ICD-10-CM | POA: Diagnosis not present

## 2019-12-31 DIAGNOSIS — F0390 Unspecified dementia without behavioral disturbance: Secondary | ICD-10-CM | POA: Diagnosis not present

## 2020-01-03 DIAGNOSIS — M25511 Pain in right shoulder: Secondary | ICD-10-CM | POA: Diagnosis not present

## 2020-01-03 DIAGNOSIS — Z1159 Encounter for screening for other viral diseases: Secondary | ICD-10-CM | POA: Diagnosis not present

## 2020-01-03 DIAGNOSIS — R488 Other symbolic dysfunctions: Secondary | ICD-10-CM | POA: Diagnosis not present

## 2020-01-03 DIAGNOSIS — R2689 Other abnormalities of gait and mobility: Secondary | ICD-10-CM | POA: Diagnosis not present

## 2020-01-03 DIAGNOSIS — Z20828 Contact with and (suspected) exposure to other viral communicable diseases: Secondary | ICD-10-CM | POA: Diagnosis not present

## 2020-01-03 DIAGNOSIS — R2681 Unsteadiness on feet: Secondary | ICD-10-CM | POA: Diagnosis not present

## 2020-01-03 DIAGNOSIS — M25512 Pain in left shoulder: Secondary | ICD-10-CM | POA: Diagnosis not present

## 2020-01-03 DIAGNOSIS — R262 Difficulty in walking, not elsewhere classified: Secondary | ICD-10-CM | POA: Diagnosis not present

## 2020-01-03 DIAGNOSIS — M6281 Muscle weakness (generalized): Secondary | ICD-10-CM | POA: Diagnosis not present

## 2020-01-05 DIAGNOSIS — R488 Other symbolic dysfunctions: Secondary | ICD-10-CM | POA: Diagnosis not present

## 2020-01-05 DIAGNOSIS — R2681 Unsteadiness on feet: Secondary | ICD-10-CM | POA: Diagnosis not present

## 2020-01-05 DIAGNOSIS — M6281 Muscle weakness (generalized): Secondary | ICD-10-CM | POA: Diagnosis not present

## 2020-01-05 DIAGNOSIS — R2689 Other abnormalities of gait and mobility: Secondary | ICD-10-CM | POA: Diagnosis not present

## 2020-01-05 DIAGNOSIS — M25511 Pain in right shoulder: Secondary | ICD-10-CM | POA: Diagnosis not present

## 2020-01-05 DIAGNOSIS — R262 Difficulty in walking, not elsewhere classified: Secondary | ICD-10-CM | POA: Diagnosis not present

## 2020-01-05 DIAGNOSIS — M25512 Pain in left shoulder: Secondary | ICD-10-CM | POA: Diagnosis not present

## 2020-01-06 DIAGNOSIS — Z20828 Contact with and (suspected) exposure to other viral communicable diseases: Secondary | ICD-10-CM | POA: Diagnosis not present

## 2020-01-06 DIAGNOSIS — Z1159 Encounter for screening for other viral diseases: Secondary | ICD-10-CM | POA: Diagnosis not present

## 2020-01-08 ENCOUNTER — Encounter (HOSPITAL_COMMUNITY): Payer: Self-pay

## 2020-01-08 ENCOUNTER — Emergency Department (HOSPITAL_COMMUNITY): Payer: Medicare PPO

## 2020-01-08 ENCOUNTER — Emergency Department (HOSPITAL_COMMUNITY)
Admission: EM | Admit: 2020-01-08 | Discharge: 2020-01-08 | Disposition: A | Discharge status: Home / Self Care | Payer: Medicare PPO | Attending: Emergency Medicine | Admitting: Emergency Medicine

## 2020-01-08 DIAGNOSIS — Z96652 Presence of left artificial knee joint: Secondary | ICD-10-CM | POA: Diagnosis not present

## 2020-01-08 DIAGNOSIS — Z96653 Presence of artificial knee joint, bilateral: Secondary | ICD-10-CM | POA: Diagnosis not present

## 2020-01-08 DIAGNOSIS — Z950 Presence of cardiac pacemaker: Secondary | ICD-10-CM | POA: Diagnosis not present

## 2020-01-08 DIAGNOSIS — I4891 Unspecified atrial fibrillation: Secondary | ICD-10-CM | POA: Diagnosis not present

## 2020-01-08 DIAGNOSIS — I130 Hypertensive heart and chronic kidney disease with heart failure and stage 1 through stage 4 chronic kidney disease, or unspecified chronic kidney disease: Secondary | ICD-10-CM | POA: Insufficient documentation

## 2020-01-08 DIAGNOSIS — Y92129 Unspecified place in nursing home as the place of occurrence of the external cause: Secondary | ICD-10-CM | POA: Insufficient documentation

## 2020-01-08 DIAGNOSIS — R600 Localized edema: Secondary | ICD-10-CM | POA: Insufficient documentation

## 2020-01-08 DIAGNOSIS — M19071 Primary osteoarthritis, right ankle and foot: Secondary | ICD-10-CM | POA: Diagnosis not present

## 2020-01-08 DIAGNOSIS — Z79899 Other long term (current) drug therapy: Secondary | ICD-10-CM | POA: Diagnosis not present

## 2020-01-08 DIAGNOSIS — Z87891 Personal history of nicotine dependence: Secondary | ICD-10-CM | POA: Diagnosis not present

## 2020-01-08 DIAGNOSIS — N1832 Chronic kidney disease, stage 3b: Secondary | ICD-10-CM | POA: Diagnosis not present

## 2020-01-08 DIAGNOSIS — B961 Klebsiella pneumoniae [K. pneumoniae] as the cause of diseases classified elsewhere: Secondary | ICD-10-CM | POA: Insufficient documentation

## 2020-01-08 DIAGNOSIS — Z7982 Long term (current) use of aspirin: Secondary | ICD-10-CM | POA: Insufficient documentation

## 2020-01-08 DIAGNOSIS — R0902 Hypoxemia: Secondary | ICD-10-CM | POA: Diagnosis not present

## 2020-01-08 DIAGNOSIS — S0990XA Unspecified injury of head, initial encounter: Secondary | ICD-10-CM | POA: Diagnosis not present

## 2020-01-08 DIAGNOSIS — S0083XA Contusion of other part of head, initial encounter: Secondary | ICD-10-CM | POA: Diagnosis not present

## 2020-01-08 DIAGNOSIS — Z20822 Contact with and (suspected) exposure to covid-19: Secondary | ICD-10-CM | POA: Insufficient documentation

## 2020-01-08 DIAGNOSIS — W19XXXA Unspecified fall, initial encounter: Secondary | ICD-10-CM

## 2020-01-08 DIAGNOSIS — Z7901 Long term (current) use of anticoagulants: Secondary | ICD-10-CM | POA: Diagnosis not present

## 2020-01-08 DIAGNOSIS — R4182 Altered mental status, unspecified: Secondary | ICD-10-CM | POA: Diagnosis not present

## 2020-01-08 DIAGNOSIS — I5020 Unspecified systolic (congestive) heart failure: Secondary | ICD-10-CM | POA: Diagnosis not present

## 2020-01-08 DIAGNOSIS — R52 Pain, unspecified: Secondary | ICD-10-CM | POA: Diagnosis not present

## 2020-01-08 DIAGNOSIS — M7989 Other specified soft tissue disorders: Secondary | ICD-10-CM | POA: Diagnosis not present

## 2020-01-08 DIAGNOSIS — S8002XA Contusion of left knee, initial encounter: Secondary | ICD-10-CM | POA: Diagnosis not present

## 2020-01-08 DIAGNOSIS — N390 Urinary tract infection, site not specified: Secondary | ICD-10-CM

## 2020-01-08 DIAGNOSIS — S9031XA Contusion of right foot, initial encounter: Secondary | ICD-10-CM | POA: Diagnosis not present

## 2020-01-08 DIAGNOSIS — I10 Essential (primary) hypertension: Secondary | ICD-10-CM | POA: Diagnosis not present

## 2020-01-08 DIAGNOSIS — W06XXXA Fall from bed, initial encounter: Secondary | ICD-10-CM | POA: Insufficient documentation

## 2020-01-08 DIAGNOSIS — R296 Repeated falls: Secondary | ICD-10-CM | POA: Diagnosis present

## 2020-01-08 HISTORY — DX: Unspecified osteoarthritis, unspecified site: M19.90

## 2020-01-08 HISTORY — DX: Insomnia, unspecified: G47.00

## 2020-01-08 LAB — CBC WITH DIFFERENTIAL/PLATELET
Abs Immature Granulocytes: 0.04 10*3/uL (ref 0.00–0.07)
Basophils Absolute: 0 10*3/uL (ref 0.0–0.1)
Basophils Relative: 1 %
Eosinophils Absolute: 0.5 10*3/uL (ref 0.0–0.5)
Eosinophils Relative: 7 %
HCT: 38.8 % (ref 36.0–46.0)
Hemoglobin: 12.2 g/dL (ref 12.0–15.0)
Immature Granulocytes: 1 %
Lymphocytes Relative: 25 %
Lymphs Abs: 1.7 10*3/uL (ref 0.7–4.0)
MCH: 30.5 pg (ref 26.0–34.0)
MCHC: 31.4 g/dL (ref 30.0–36.0)
MCV: 97 fL (ref 80.0–100.0)
Monocytes Absolute: 0.8 10*3/uL (ref 0.1–1.0)
Monocytes Relative: 11 %
Neutro Abs: 3.9 10*3/uL (ref 1.7–7.7)
Neutrophils Relative %: 55 %
Platelets: 211 10*3/uL (ref 150–400)
RBC: 4 MIL/uL (ref 3.87–5.11)
RDW: 13.7 % (ref 11.5–15.5)
WBC: 7 10*3/uL (ref 4.0–10.5)
nRBC: 0 % (ref 0.0–0.2)

## 2020-01-08 LAB — COMPREHENSIVE METABOLIC PANEL
ALT: 11 U/L (ref 0–44)
AST: 17 U/L (ref 15–41)
Albumin: 3.5 g/dL (ref 3.5–5.0)
Alkaline Phosphatase: 140 U/L — ABNORMAL HIGH (ref 38–126)
Anion gap: 9 (ref 5–15)
BUN: 26 mg/dL — ABNORMAL HIGH (ref 8–23)
CO2: 30 mmol/L (ref 22–32)
Calcium: 9 mg/dL (ref 8.9–10.3)
Chloride: 102 mmol/L (ref 98–111)
Creatinine, Ser: 1.38 mg/dL — ABNORMAL HIGH (ref 0.44–1.00)
GFR, Estimated: 36 mL/min — ABNORMAL LOW (ref >60)
Glucose, Bld: 116 mg/dL — ABNORMAL HIGH (ref 70–99)
Potassium: 4.6 mmol/L (ref 3.5–5.1)
Sodium: 141 mmol/L (ref 135–145)
Total Bilirubin: 0.7 mg/dL (ref 0.3–1.2)
Total Protein: 6.5 g/dL (ref 6.5–8.1)

## 2020-01-08 LAB — RESP PANEL BY RT-PCR (FLU A&B, COVID) ARPGX2
Influenza A by PCR: NEGATIVE
Influenza B by PCR: NEGATIVE
SARS Coronavirus 2 by RT PCR: NEGATIVE

## 2020-01-08 LAB — URINALYSIS, ROUTINE W REFLEX MICROSCOPIC
Bilirubin Urine: NEGATIVE
Glucose, UA: NEGATIVE mg/dL
Hgb urine dipstick: NEGATIVE
Ketones, ur: NEGATIVE mg/dL
Nitrite: POSITIVE — AB
Protein, ur: NEGATIVE mg/dL
Specific Gravity, Urine: 1.009 (ref 1.005–1.030)
pH: 5 (ref 5.0–8.0)

## 2020-01-08 MED ORDER — CEPHALEXIN 500 MG PO CAPS: 500.0000 mg | ORAL_CAPSULE | Freq: Four times a day (QID) | ORAL | 0 refills | Status: DC

## 2020-01-08 MED ORDER — SODIUM CHLORIDE 0.9 % IV SOLN
1.0000 g | Freq: Once | INTRAVENOUS | Status: AC
  Administered 2020-01-08: 1 g via INTRAVENOUS
  Filled 2020-01-08: qty 10

## 2020-01-08 NOTE — ED Notes (Signed)
Patient and son updated on plan of care and pending discharge. Made aware of transportation process with [facility], son reports he will take her back to facility.

## 2020-01-08 NOTE — ED Provider Notes (Addendum)
[facility] EMERGENCY DEPARTMENT Provider Note   CSN: [id number] Arrival date & time: 01/08/20  1532     History Chief Complaint  Patient presents with  . Fall    Deborah Chung is a 86 y.o. female.  Patient is a 86 year old female with a history of atrial fibrillation on Eliquis, hypertension, hyperlipidemia, dementia, CHF who lives at an assisted living facility and is being brought in today due to multiple falls in the last week and not acting herself.  Patient is awake and alert upon arrival here and reports that today she just slipped off of the bed.  She denies hurting herself during the fall but does report in a fall earlier in the week she did hit her face and head but overall feels that she got lucky that she did not hurt herself badly.  She denies hitting her head or loss of consciousness today.  She denies any recent headaches, chest pain, shortness of breath or abdominal pain.  She reports her knees and feet are sore from the fall earlier in the week but nothing is worse than what it has been.  Per staff report they feel that she is just not been herself she normally walks with her walker and she has been unsteady.  The history is provided by the patient, the EMS personnel and the nursing home.  Fall       Past Medical History:  Diagnosis Date  . Anxiety   . Arthritis   . Atrial fibrillation (HCC)   . Blood transfusion without reported diagnosis   . GAD (generalized anxiety disorder)   . Hearing loss   . Hyperlipidemia   . Hypertension   . Insomnia   . Pacemaker   . PHN (postherpetic neuralgia) 2010  . Systolic CHF [facility])     Patient Active Problem List   Diagnosis Date Noted  . Tinea corporis 04/21/2019  . 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 Stage 3b chronic kidney disease (HCC) 04/21/2019  . Complete heart block (HCC) 01/28/2019  . Rotator cuff tear arthropathy 01/28/2018  . Primary osteoarthritis involving  multiple joints 01/27/2018  . Psychophysiological insomnia 06/24/2017  . Bacterial overgrowth syndrome 08/31/2015  . Non-ischemic cardiomyopathy (HCC) 08/21/2015  . Hyperlipidemia with target LDL less than 100 05/30/2015  . Allergic rhinitis due to pollen 05/30/2015  . Gastroesophageal reflux disease without esophagitis 05/30/2015  . Hyperglycemia 05/30/2015  . Routine general medical examination at a health care facility 05/30/2015  . Pacemaker 08/03/2013  . Atrial fibrillation (HCC) 06/29/2013  . Essential hypertension, benign 06/29/2013  . Hypertriglyceridemia 06/29/2013  . Depression with anxiety 06/29/2013  . PHN (postherpetic neuralgia) 06/29/2013    Past Surgical History:  Procedure Laterality Date  . ABDOMINAL HYSTERECTOMY    . PACEMAKER PLACEMENT    . TOTAL KNEE ARTHROPLASTY Bilateral      OB History    Gravida  3   Para  3   Term  3   Preterm  0   AB  0   Living  3     SAB  0   IAB  0   Ectopic  0   Multiple      Live Births  3           Family History  Problem Relation Age of Onset  . Heart disease Father   . Heart disease Sister     Social History   Tobacco Use  . Smoking status: Former Smoker  Years: 25.00    Quit date: 08/03/1988    Years since quitting: 31.4  . Smokeless tobacco: Never Used  Vaping Use  . Vaping Use: Never used  Substance Use Topics  . Alcohol use: Not Currently  . Drug use: No    Home Medications Prior to Admission medications   Medication Sig Start Date End Date Taking? Authorizing Provider  acetaminophen (TYLENOL) 325 MG tablet Take 650 mg by mouth 2 (two) times daily as needed (pain).   Yes [provider]  ALPRAZolam (XANAX) 0.25 MG tablet TAKE 1 TABLET BY MOUTH AS NEEDED FOR ANXIETY Patient taking differently: Take 0.25 mg by mouth daily as needed for anxiety. 05/13/19  Yes Etta Grandchild, MD  apixaban (ELIQUIS) 2.5 MG TABS tablet Take 2.5 mg by mouth 2 (two) times daily.   Yes [provider]  aspirin 81 MG chewable tablet Chew 81 mg by mouth daily.   Yes [provider]  Biotin 91478 MCG TABS Take 10,000 mcg by mouth daily.   Yes [provider]  calcium carbonate (TUMS) 500 MG chewable tablet Chew 1 tablet (200 mg of elemental calcium total) by mouth 2 (two) times daily as needed for indigestion or heartburn. 06/03/19  Yes Etta Grandchild, MD  diphenoxylate-atropine (LOMOTIL) 2.5-0.025 MG tablet Take 1 tablet by mouth every 6 (six) hours as needed for diarrhea or loose stools.   Yes [provider]  DULoxetine (CYMBALTA) 60 MG capsule Take 1 capsule (60 mg total) by mouth daily. 01/27/18  Yes Etta Grandchild, MD  esomeprazole (NEXIUM) 20 MG capsule Take 1 capsule (20 mg total) by mouth daily at 12 noon. 07/16/19  Yes Etta Grandchild, MD  famotidine (PEPCID) 20 MG tablet Take 20 mg by mouth daily at 6 (six) AM. 04/17/19  Yes [provider]  guaiFENesin (ROBITUSSIN) 100 MG/5ML liquid Take 200 mg by mouth every 4 (four) hours as needed for cough.   Yes [provider]  lipase/protease/amylase (CREON) 36000 UNITS CPEP capsule Take 2 capsules with each meal and 1 capsule with each snack Patient taking differently: Take 72,000 Units by mouth 3 (three) times daily before meals. 11/03/18  Yes Rachael Fee, MD  loratadine (CLARITIN) 10 MG tablet Take 10 mg by mouth daily.   Yes [provider]  metoprolol tartrate (LOPRESSOR) 25 MG tablet TAKE 1 TABLET BY MOUTH TWICE A DAY Patient taking differently: Take 25 mg by mouth 2 (two) times daily. (BETA BLOCKER) 01/13/18  Yes Etta Grandchild, MD  neomycin-bacitracin-polymyxin (NEOSPORIN) ointment Apply 1 application topically See admin instructions. Ordered 12/13/2019: Cleanse skin tear on left hand close to the wrist with soap and water. Pat to dry. Apply thin layer of triple antibiotic oint, cover with bandaid or non-adhesive dressing once daily until healed, then discontinue.   Yes  [provider]  ondansetron (ZOFRAN) 4 MG tablet Take 4 mg by mouth every 4 (four) hours as needed for nausea or vomiting.   Yes [provider]  polyvinyl alcohol (LIQUIFILM TEARS) 1.4 % ophthalmic solution Place 1 drop into both eyes 4 (four) times daily as needed for dry eyes.   Yes [provider]  pregabalin (LYRICA) 75 MG capsule Take 1 capsule (75 mg total) by mouth 3 (three) times daily. 10/22/19  Yes Etta Grandchild, MD  Probiotic Product (PROBIOTIC PO) Take 1 capsule by mouth daily.   Yes [provider]  QUEtiapine (SEROQUEL) 100 MG tablet Take 1 tablet (100 mg  total) by mouth at bedtime. 12/29/19  Yes Etta Grandchild, MD  spironolactone (ALDACTONE) 25 MG tablet TAKE 1 TABLET BY MOUTH EVERY DAY Patient taking differently: Take 25 mg by mouth daily. 06/12/18  Yes Etta Grandchild, MD  torsemide (DEMADEX) 10 MG tablet Take 10 mg by mouth daily.   Yes [provider]  traZODone (DESYREL) 50 MG tablet TAKE 1 TABLET (50 MG TOTAL) BY MOUTH AT BEDTIME AS NEEDED FOR SLEEP. Patient taking differently: Take 50 mg by mouth at bedtime as needed for sleep. 05/03/18  Yes Etta Grandchild, MD    Allergies    Patient has no known allergies.  Review of Systems   Review of Systems  All other systems reviewed and are negative.   Physical Exam Updated Vital Signs BP (!) 130/54   Pulse 70   Temp 97.8 F (36.6 C) (Oral)   Resp 17   Ht 5\' 5"  (1.651 m)   Wt 94.8 kg   SpO2 98%   BMI 34.78 kg/m   Physical Exam Vitals and nursing note reviewed.  Constitutional:      General: She is not in acute distress.    Appearance: She is well-developed and well-nourished. She is obese.  HENT:     Head: Normocephalic. Contusion present.   Eyes:     Extraocular Movements: EOM normal.     Pupils: Pupils are equal, round, and reactive to light.  Cardiovascular:     Rate and Rhythm: Normal rate and regular rhythm.     Pulses: Normal pulses and intact distal  pulses.     Heart sounds: Murmur heard.  No friction rub.  Pulmonary:     Effort: Pulmonary effort is normal.     Breath sounds: Normal breath sounds. No wheezing or rales.  Abdominal:     General: Bowel sounds are normal. There is no distension.     Palpations: Abdomen is soft.     Tenderness: There is no abdominal tenderness. There is no guarding or rebound.  Musculoskeletal:        General: No tenderness. Normal range of motion.     Cervical back: Normal range of motion and neck supple. No tenderness.     Right lower leg: Edema present.     Left lower leg: Edema present.     Comments: 1+ pitting edema bilaterally in the ankles.  Significant ecchymosis and tenderness to the lateral right foot but full range of motion of the ankle.  Hammertoe noted.  Bilateral knees with healing ecchymosis but full flexion and extension.  Increased pain with flexion of the left knee with signs consistent with prior knee replacement.  Full range of motion of bilateral hips and shoulders without associated pain.  Skin:    General: Skin is warm and dry.     Findings: No rash.  Neurological:     Mental Status: She is alert.     Cranial Nerves: No cranial nerve deficit.     Comments: Oriented to person and place.  Able to follow commands.  Occasionally will say something that does not make sense but she easily reorients.  Moving all extremities without difficulty.  Psychiatric:        Mood and Affect: Mood and affect normal.     Comments: Calm and cooperative     ED Results / Procedures / Treatments   Labs (all labs ordered are listed, but only abnormal results are displayed) Labs Reviewed  COMPREHENSIVE METABOLIC PANEL - Abnormal; Notable for  the following components:      Result Value   Glucose, Bld 116 (*)    BUN 26 (*)    Creatinine, Ser 1.38 (*)    Alkaline Phosphatase 140 (*)    GFR, Estimated 36 (*)    All other components within normal limits  URINALYSIS, ROUTINE W REFLEX MICROSCOPIC -  Abnormal; Notable for the following components:   Nitrite POSITIVE (*)    Leukocytes,Ua MODERATE (*)    Bacteria, UA MANY (*)    All other components within normal limits  RESP PANEL BY RT-PCR (FLU A&B, COVID) [id number]  CBC WITH DIFFERENTIAL/PLATELET    EKG EKG Interpretation  Date/Time:  Saturday January 08 2020 15:57:51 EST Ventricular Rate:  70 PR Interval:    QRS Duration: 101 QT Interval:  396 QTC Calculation: 428 R Axis:   83 Text Interpretation: Sinus rhythm Borderline prolonged PR interval Borderline right axis deviation No previous tracing Confirmed by Gwyneth Sprout ([id number]) on 01/08/2020 4:26:56 PM   Radiology DG Knee 2 Views Left  Result Date: 01/08/2020 CLINICAL DATA:  Bruising, swelling, and pain, fell 4 times this week at nursing facility EXAM: LEFT KNEE - 1-2 VIEW COMPARISON:  LEFT tibia and fibula 05/16/2014 FINDINGS: Osseous demineralization. Components of LEFT knee prosthesis identified. No acute fracture, dislocation, or bone destruction. No joint effusion. IMPRESSION: LEFT knee prosthesis and osseous demineralization. No acute abnormalities. Electronically Signed   By: Ulyses Southward M.D.   On: 01/08/2020 17:10   CT Head Wo Contrast  Result Date: 01/08/2020 CLINICAL DATA:  Fall, minor head trauma, altered mental status, history atrial fibrillation, hypertension, systolic CHF EXAM: CT HEAD WITHOUT CONTRAST TECHNIQUE: Contiguous axial images were obtained from the base of the skull through the vertex without intravenous contrast. Sagittal and coronal MPR images reconstructed from axial data set. COMPARISON:  04/28/2019 FINDINGS: Brain: Generalized atrophy. Normal ventricular morphology. No midline shift or mass effect. Small vessel chronic ischemic changes of deep cerebral white matter. No intracranial hemorrhage, mass lesion, evidence of acute infarction, or extra-axial fluid collection. Vascular: No hyperdense vessels. Atherosclerotic calcifications present at  internal carotid arteries bilaterally at skull base Skull: Intact Sinuses/Orbits: Mucosal retention cyst RIGHT maxillary sinus. Visualized paranasal sinuses and mastoid air cells otherwise clear Other: N/A IMPRESSION: Atrophy with small vessel chronic ischemic changes of deep cerebral white matter. No acute intracranial abnormalities. Electronically Signed   By: Ulyses Southward M.D.   On: 01/08/2020 17:19   DG Foot Complete Right  Result Date: 01/08/2020 CLINICAL DATA:  Larey Seat 4 times this week at nursing facility, bruising and swelling at lateral RIGHT foot EXAM: RIGHT FOOT COMPLETE - 3+ VIEW COMPARISON:  None FINDINGS: Osseous demineralization. Marked hallux valgus with mild degenerative changes at first MTP joint. Radial subluxation at second MTP joint. Remaining joint spaces preserved. No acute fracture, dislocation, or bone destruction. IMPRESSION: Osseous demineralization with marked calix valgus and mild first MTP joint degenerative changes. No acute osseous abnormalities. Electronically Signed   By: Ulyses Southward M.D.   On: 01/08/2020 17:12    Procedures Procedures (including critical care time)  Medications Ordered in ED Medications - No data to display  ED Course  I have reviewed the triage vital signs and the nursing notes.  Pertinent labs & imaging results that were available during my care of the patient were reviewed by me and considered in my medical decision making (see chart for details).    MDM Rules/Calculators/A&P  Elderly female presenting tonight from her assisted living after multiple falls this week.  Facility reports that is unusual for her because she is usually able to ambulate with a walker.  Patient does take anticoagulation and has had an injury to her head in the last week during one of the falls.  It does not appear that she was sent to the hospital for prior falls and has not had any evaluation on this.  She is awake and alert and appears to be  at her baseline at this time.  She has multiple areas of ecchymosis specifically on her right foot and left knee as well as the left forehead.  We will do a CT to rule out any secondary bleeding as patient is on anticoagulation.  We will also do plain films of the right foot and left knee.  Given patient's recent change in ambulation and not acting quite herself will do labs and urine to ensure no other acute findings.  She has no complaints at this time.  CBC and CMP today without acute changes.  UA with concern for UTI with nitrite and leukocyte positive as well as 11-20 white blood cells and many bacteria.  Will treat with antibiotic at this time.  EKG without acute findings at this time and patient is in sinus rhythm.  Patient did have a urine culture that grew out E. coli in April 2021 and at that time it was pansensitive. COVID neg.  6:46 PM Patient given a dose of Rocephin here and will have her continue Keflex starting tomorrow.  Spoke with both of her sons who are aware of the plan.  Patient did have increased level of care at her assisted living facility due to the frequent falls in the last week.  At this time there is no indication for admission.  MDM Number of Diagnoses or Management Options   Amount and/or Complexity of Data Reviewed Clinical lab tests: ordered and reviewed Tests in the radiology section of CPT: ordered and reviewed Tests in the medicine section of CPT: ordered and reviewed Decide to obtain previous medical records or to obtain history from someone other than the patient: yes Obtain history from someone other than the patient: yes Review and summarize past medical records: yes Discuss the patient with other providers: no Independent visualization of images, tracings, or specimens: yes  Risk of Complications, Morbidity, and/or Mortality Presenting problems: moderate Diagnostic procedures: low Management options: low  Patient Progress Patient progress:  stable   Final Clinical Impression(s) / ED Diagnoses Final diagnoses:  Fall in home, initial encounter  Urinary tract infection without hematuria, site unspecified    Rx / DC Orders ED Discharge Orders         Ordered    cephALEXin (KEFLEX) 500 MG capsule  4 times daily        01/08/20 Kermit Balo, MD 01/08/20 Kermit Balo, MD 01/08/20 [phone]

## 2020-01-08 NOTE — ED Triage Notes (Addendum)
Pt arrived via [facility] from [place] where she has had 4 falls in the past week. Per EMS pt told them she was sitting on her bed on top of a silk blanket and the blanket made her slide to the floor. Per EMS pt denied to them hitting head or LOC. Per EMS pt A&Ox3. Pt is A&Ox1 to self only for me. Pt has ecchymosis and a hematoma on the left side of forehead, ecchymosis on the second knuckle of right hand, ecchymosis of anterior of right foot with 1+ swelling of the right foot, ecchymosis of left knee and 2+ swelling of knee, ecchymosis of left side of eye.  Pt on eliquis and ASA

## 2020-01-08 NOTE — ED Notes (Signed)
Patient transported to CT 

## 2020-01-10 DIAGNOSIS — F331 Major depressive disorder, recurrent, moderate: Secondary | ICD-10-CM | POA: Diagnosis not present

## 2020-01-10 DIAGNOSIS — Z20828 Contact with and (suspected) exposure to other viral communicable diseases: Secondary | ICD-10-CM | POA: Diagnosis not present

## 2020-01-10 DIAGNOSIS — Z1159 Encounter for screening for other viral diseases: Secondary | ICD-10-CM | POA: Diagnosis not present

## 2020-01-10 DIAGNOSIS — R262 Difficulty in walking, not elsewhere classified: Secondary | ICD-10-CM | POA: Diagnosis not present

## 2020-01-10 DIAGNOSIS — R488 Other symbolic dysfunctions: Secondary | ICD-10-CM | POA: Diagnosis not present

## 2020-01-10 DIAGNOSIS — K219 Gastro-esophageal reflux disease without esophagitis: Secondary | ICD-10-CM | POA: Diagnosis not present

## 2020-01-10 DIAGNOSIS — Z79899 Other long term (current) drug therapy: Secondary | ICD-10-CM | POA: Diagnosis not present

## 2020-01-10 DIAGNOSIS — R2681 Unsteadiness on feet: Secondary | ICD-10-CM | POA: Diagnosis not present

## 2020-01-10 DIAGNOSIS — Z950 Presence of cardiac pacemaker: Secondary | ICD-10-CM | POA: Diagnosis not present

## 2020-01-10 DIAGNOSIS — F5101 Primary insomnia: Secondary | ICD-10-CM | POA: Diagnosis not present

## 2020-01-10 DIAGNOSIS — Z7901 Long term (current) use of anticoagulants: Secondary | ICD-10-CM | POA: Diagnosis not present

## 2020-01-10 DIAGNOSIS — I5040 Unspecified combined systolic (congestive) and diastolic (congestive) heart failure: Secondary | ICD-10-CM | POA: Diagnosis not present

## 2020-01-10 DIAGNOSIS — M25511 Pain in right shoulder: Secondary | ICD-10-CM | POA: Diagnosis not present

## 2020-01-10 DIAGNOSIS — M6281 Muscle weakness (generalized): Secondary | ICD-10-CM | POA: Diagnosis not present

## 2020-01-10 DIAGNOSIS — N390 Urinary tract infection, site not specified: Secondary | ICD-10-CM | POA: Diagnosis not present

## 2020-01-10 DIAGNOSIS — I480 Paroxysmal atrial fibrillation: Secondary | ICD-10-CM | POA: Diagnosis not present

## 2020-01-10 DIAGNOSIS — R2689 Other abnormalities of gait and mobility: Secondary | ICD-10-CM | POA: Diagnosis not present

## 2020-01-10 DIAGNOSIS — M25512 Pain in left shoulder: Secondary | ICD-10-CM | POA: Diagnosis not present

## 2020-01-10 LAB — URINE CULTURE: Culture: >=100000 — AB

## 2020-01-13 DIAGNOSIS — M25511 Pain in right shoulder: Secondary | ICD-10-CM | POA: Diagnosis not present

## 2020-01-13 DIAGNOSIS — Z20828 Contact with and (suspected) exposure to other viral communicable diseases: Secondary | ICD-10-CM | POA: Diagnosis not present

## 2020-01-13 DIAGNOSIS — R2689 Other abnormalities of gait and mobility: Secondary | ICD-10-CM | POA: Diagnosis not present

## 2020-01-13 DIAGNOSIS — R262 Difficulty in walking, not elsewhere classified: Secondary | ICD-10-CM | POA: Diagnosis not present

## 2020-01-13 DIAGNOSIS — R2681 Unsteadiness on feet: Secondary | ICD-10-CM | POA: Diagnosis not present

## 2020-01-13 DIAGNOSIS — R488 Other symbolic dysfunctions: Secondary | ICD-10-CM | POA: Diagnosis not present

## 2020-01-13 DIAGNOSIS — M25512 Pain in left shoulder: Secondary | ICD-10-CM | POA: Diagnosis not present

## 2020-01-13 DIAGNOSIS — Z1159 Encounter for screening for other viral diseases: Secondary | ICD-10-CM | POA: Diagnosis not present

## 2020-01-13 DIAGNOSIS — M6281 Muscle weakness (generalized): Secondary | ICD-10-CM | POA: Diagnosis not present

## 2020-01-17 DIAGNOSIS — R262 Difficulty in walking, not elsewhere classified: Secondary | ICD-10-CM | POA: Diagnosis not present

## 2020-01-17 DIAGNOSIS — M6281 Muscle weakness (generalized): Secondary | ICD-10-CM | POA: Diagnosis not present

## 2020-01-17 DIAGNOSIS — Z79899 Other long term (current) drug therapy: Secondary | ICD-10-CM | POA: Diagnosis not present

## 2020-01-17 DIAGNOSIS — K219 Gastro-esophageal reflux disease without esophagitis: Secondary | ICD-10-CM | POA: Diagnosis not present

## 2020-01-17 DIAGNOSIS — Z1159 Encounter for screening for other viral diseases: Secondary | ICD-10-CM | POA: Diagnosis not present

## 2020-01-17 DIAGNOSIS — M25511 Pain in right shoulder: Secondary | ICD-10-CM | POA: Diagnosis not present

## 2020-01-17 DIAGNOSIS — Z20828 Contact with and (suspected) exposure to other viral communicable diseases: Secondary | ICD-10-CM | POA: Diagnosis not present

## 2020-01-17 DIAGNOSIS — M2560 Stiffness of unspecified joint, not elsewhere classified: Secondary | ICD-10-CM | POA: Diagnosis not present

## 2020-01-17 DIAGNOSIS — I480 Paroxysmal atrial fibrillation: Secondary | ICD-10-CM | POA: Diagnosis not present

## 2020-01-17 DIAGNOSIS — R2681 Unsteadiness on feet: Secondary | ICD-10-CM | POA: Diagnosis not present

## 2020-01-17 DIAGNOSIS — Z7901 Long term (current) use of anticoagulants: Secondary | ICD-10-CM | POA: Diagnosis not present

## 2020-01-17 DIAGNOSIS — R296 Repeated falls: Secondary | ICD-10-CM | POA: Diagnosis not present

## 2020-01-17 DIAGNOSIS — R488 Other symbolic dysfunctions: Secondary | ICD-10-CM | POA: Diagnosis not present

## 2020-01-17 DIAGNOSIS — M25512 Pain in left shoulder: Secondary | ICD-10-CM | POA: Diagnosis not present

## 2020-01-17 DIAGNOSIS — N939 Abnormal uterine and vaginal bleeding, unspecified: Secondary | ICD-10-CM | POA: Diagnosis not present

## 2020-01-19 DIAGNOSIS — M25512 Pain in left shoulder: Secondary | ICD-10-CM | POA: Diagnosis not present

## 2020-01-19 DIAGNOSIS — R2681 Unsteadiness on feet: Secondary | ICD-10-CM | POA: Diagnosis not present

## 2020-01-19 DIAGNOSIS — R262 Difficulty in walking, not elsewhere classified: Secondary | ICD-10-CM | POA: Diagnosis not present

## 2020-01-19 DIAGNOSIS — R488 Other symbolic dysfunctions: Secondary | ICD-10-CM | POA: Diagnosis not present

## 2020-01-19 DIAGNOSIS — M25511 Pain in right shoulder: Secondary | ICD-10-CM | POA: Diagnosis not present

## 2020-01-19 DIAGNOSIS — R296 Repeated falls: Secondary | ICD-10-CM | POA: Diagnosis not present

## 2020-01-19 DIAGNOSIS — M6281 Muscle weakness (generalized): Secondary | ICD-10-CM | POA: Diagnosis not present

## 2020-01-20 DIAGNOSIS — M25512 Pain in left shoulder: Secondary | ICD-10-CM | POA: Diagnosis not present

## 2020-01-20 DIAGNOSIS — M6281 Muscle weakness (generalized): Secondary | ICD-10-CM | POA: Diagnosis not present

## 2020-01-20 DIAGNOSIS — M25511 Pain in right shoulder: Secondary | ICD-10-CM | POA: Diagnosis not present

## 2020-01-20 DIAGNOSIS — R296 Repeated falls: Secondary | ICD-10-CM | POA: Diagnosis not present

## 2020-01-20 DIAGNOSIS — R2681 Unsteadiness on feet: Secondary | ICD-10-CM | POA: Diagnosis not present

## 2020-01-20 DIAGNOSIS — Z20828 Contact with and (suspected) exposure to other viral communicable diseases: Secondary | ICD-10-CM | POA: Diagnosis not present

## 2020-01-20 DIAGNOSIS — R488 Other symbolic dysfunctions: Secondary | ICD-10-CM | POA: Diagnosis not present

## 2020-01-20 DIAGNOSIS — R262 Difficulty in walking, not elsewhere classified: Secondary | ICD-10-CM | POA: Diagnosis not present

## 2020-01-20 DIAGNOSIS — Z1159 Encounter for screening for other viral diseases: Secondary | ICD-10-CM | POA: Diagnosis not present

## 2020-01-24 DIAGNOSIS — Z20828 Contact with and (suspected) exposure to other viral communicable diseases: Secondary | ICD-10-CM | POA: Diagnosis not present

## 2020-01-24 DIAGNOSIS — I480 Paroxysmal atrial fibrillation: Secondary | ICD-10-CM | POA: Diagnosis not present

## 2020-01-24 DIAGNOSIS — R2681 Unsteadiness on feet: Secondary | ICD-10-CM | POA: Diagnosis not present

## 2020-01-24 DIAGNOSIS — M25512 Pain in left shoulder: Secondary | ICD-10-CM | POA: Diagnosis not present

## 2020-01-24 DIAGNOSIS — M6281 Muscle weakness (generalized): Secondary | ICD-10-CM | POA: Diagnosis not present

## 2020-01-24 DIAGNOSIS — R488 Other symbolic dysfunctions: Secondary | ICD-10-CM | POA: Diagnosis not present

## 2020-01-24 DIAGNOSIS — R443 Hallucinations, unspecified: Secondary | ICD-10-CM | POA: Diagnosis not present

## 2020-01-24 DIAGNOSIS — R262 Difficulty in walking, not elsewhere classified: Secondary | ICD-10-CM | POA: Diagnosis not present

## 2020-01-24 DIAGNOSIS — F0391 Unspecified dementia with behavioral disturbance: Secondary | ICD-10-CM | POA: Diagnosis not present

## 2020-01-24 DIAGNOSIS — Z79899 Other long term (current) drug therapy: Secondary | ICD-10-CM | POA: Diagnosis not present

## 2020-01-24 DIAGNOSIS — N939 Abnormal uterine and vaginal bleeding, unspecified: Secondary | ICD-10-CM | POA: Diagnosis not present

## 2020-01-24 DIAGNOSIS — M25511 Pain in right shoulder: Secondary | ICD-10-CM | POA: Diagnosis not present

## 2020-01-24 DIAGNOSIS — R296 Repeated falls: Secondary | ICD-10-CM | POA: Diagnosis not present

## 2020-01-24 DIAGNOSIS — Z7901 Long term (current) use of anticoagulants: Secondary | ICD-10-CM | POA: Diagnosis not present

## 2020-01-24 DIAGNOSIS — N390 Urinary tract infection, site not specified: Secondary | ICD-10-CM | POA: Diagnosis not present

## 2020-01-24 DIAGNOSIS — Z1159 Encounter for screening for other viral diseases: Secondary | ICD-10-CM | POA: Diagnosis not present

## 2020-01-24 DIAGNOSIS — W19XXXA Unspecified fall, initial encounter: Secondary | ICD-10-CM | POA: Diagnosis not present

## 2020-01-25 DIAGNOSIS — R488 Other symbolic dysfunctions: Secondary | ICD-10-CM | POA: Diagnosis not present

## 2020-01-25 DIAGNOSIS — R296 Repeated falls: Secondary | ICD-10-CM | POA: Diagnosis not present

## 2020-01-25 DIAGNOSIS — M6281 Muscle weakness (generalized): Secondary | ICD-10-CM | POA: Diagnosis not present

## 2020-01-25 DIAGNOSIS — M25511 Pain in right shoulder: Secondary | ICD-10-CM | POA: Diagnosis not present

## 2020-01-25 DIAGNOSIS — M25512 Pain in left shoulder: Secondary | ICD-10-CM | POA: Diagnosis not present

## 2020-01-25 DIAGNOSIS — R262 Difficulty in walking, not elsewhere classified: Secondary | ICD-10-CM | POA: Diagnosis not present

## 2020-01-25 DIAGNOSIS — R2681 Unsteadiness on feet: Secondary | ICD-10-CM | POA: Diagnosis not present

## 2020-01-26 DIAGNOSIS — M25511 Pain in right shoulder: Secondary | ICD-10-CM | POA: Diagnosis not present

## 2020-01-26 DIAGNOSIS — M25512 Pain in left shoulder: Secondary | ICD-10-CM | POA: Diagnosis not present

## 2020-01-26 DIAGNOSIS — M6281 Muscle weakness (generalized): Secondary | ICD-10-CM | POA: Diagnosis not present

## 2020-01-26 DIAGNOSIS — R296 Repeated falls: Secondary | ICD-10-CM | POA: Diagnosis not present

## 2020-01-26 DIAGNOSIS — R488 Other symbolic dysfunctions: Secondary | ICD-10-CM | POA: Diagnosis not present

## 2020-01-26 DIAGNOSIS — R262 Difficulty in walking, not elsewhere classified: Secondary | ICD-10-CM | POA: Diagnosis not present

## 2020-01-26 DIAGNOSIS — R2681 Unsteadiness on feet: Secondary | ICD-10-CM | POA: Diagnosis not present

## 2020-01-27 ENCOUNTER — Telehealth (INDEPENDENT_AMBULATORY_CARE_PROVIDER_SITE_OTHER): Payer: Medicare PPO | Admitting: Internal Medicine

## 2020-01-27 ENCOUNTER — Encounter: Payer: Self-pay | Admitting: Internal Medicine

## 2020-01-27 VITALS — BP 128/67 | HR 73

## 2020-01-27 DIAGNOSIS — I10 Essential (primary) hypertension: Secondary | ICD-10-CM

## 2020-01-27 DIAGNOSIS — I428 Other cardiomyopathies: Secondary | ICD-10-CM

## 2020-01-27 DIAGNOSIS — R600 Localized edema: Secondary | ICD-10-CM | POA: Diagnosis not present

## 2020-01-27 DIAGNOSIS — R0609 Other forms of dyspnea: Secondary | ICD-10-CM

## 2020-01-27 DIAGNOSIS — R0600 Dyspnea, unspecified: Secondary | ICD-10-CM

## 2020-01-27 DIAGNOSIS — I442 Atrioventricular block, complete: Secondary | ICD-10-CM

## 2020-01-27 NOTE — Patient Instructions (Signed)
Medication Instructions:  Your physician recommends that you continue on your current medications as directed. Please refer to the Current Medication list given to you today.  *If you need a refill on your cardiac medications before your next appointment, please call your pharmacy*   Follow-Up: At [facility], you and your health needs are our priority.  As part of our continuing mission to provide you with exceptional heart care, we have created designated Provider Care Teams.  These Care Teams include your primary Cardiologist (physician) and Advanced Practice Providers (APPs -  Physician Assistants and Nurse Practitioners) who all work together to provide you with the care you need, when you need it.  We recommend signing up for the patient portal called "MyChart".  Sign up information is provided on this After Visit Summary.  MyChart is used to connect with patients for Virtual Visits (Telemedicine).  Patients are able to view lab/test results, encounter notes, upcoming appointments, etc.  Non-urgent messages can be sent to your provider as well.   To learn more about what you can do with MyChart, go to [email or web address].[email or web address].    Your next appointment:   6 month(s)  The format for your next appointment:   In Person  Provider:   You may see Dr. Hilty or one of the following Advanced Practice Providers on your designated Care Team:    Hao Meng, PA-C  Angela Duke, PA-C or   Krista Kroeger, PA-C    Other Instructions   

## 2020-01-27 NOTE — Progress Notes (Signed)
Virtual Visit via Video Note   This visit type was conducted due to national recommendations for restrictions regarding the COVID-19 Pandemic (e.g. social distancing) in an effort to limit this patient's exposure and mitigate transmission in our community.  Due to her co-morbid illnesses, this patient is at least at moderate risk for complications without adequate follow up.  This format is felt to be most appropriate for this patient at this time.  All issues noted in this document were discussed and addressed.  A limited physical exam was performed with this format.  Please refer to the patient's chart for her consent to telehealth for [facility]      Date:  01/27/2020   ID:  Deborah Chung, DOB Jan 01, 1927, MRN [id number] The patient was identified using 2 identifiers.  Evaluation Performed:  Follow-Up Visit  Patient Location:  [address] [place] [address]  Provider location:   [address] [place], [place] [address]  PCP:  Etta Grandchild, MD  Cardiologist:  No primary care provider on file. Electrophysiologist:  None   Chief Complaint:  Follow-up CHF  History of Present Illness:    Deborah Chung is a 87 y.o. female who presents via audio/video conferencing for a telehealth visit today.  Deborah Chung is a pleasant 87 year old female who recently moved to [place] from [place]. She was apparently living in [place], somewhere outside of [place]. Her son is Rosali Augello, who is the in-house counsel for the [employer] [employer] system. She was previously seeing [facility] and had an episode in February of 2015. Apparently she had a mobile health screening and was found to be out of rhythm. She was sent to her primary care doctor who did an EKG and noted she was in A. fib and was sent immediately to the hospital. As the story goes, she was on several treatments for her A. fib and apparently eventually underwent cardiac  catheterization and/or possibly an ablation procedure for which she developed asystole or some type of arrhythmia requiring her to be resuscitated. Obviously this was successful and she received apparently a pacemaker. She is not clear as to what the type of pacemaker wasn't did not provide documentation today. I've not yet received any records from her cardiologist in [place]. She was placed on Eliquis for anticoagulation. In addition, it appears she is on digoxin and Aldactone along with Lopressor which is suggestive of possible cardiomyopathy. She apparently does have a heart failure diagnosis however I'm not certain as to what her ejection fraction is. She reports a remote check device for her pacemaker and did a remote check apparently last week which went to [place]. Symptomatically she denies any chest pain or shortness of breath.  I saw Deborah Chung back today in the office. She is doing fairly well. She saw Dr. Lewayne Bunting in November who performed a pacemaker check and felt that she was doing well in sinus rhythm. There is no evidence for recurrent atrial fibrillation. She has yet to be enrolled in home monitoring. Twice she has requested a home monitor but it has not been yet sent to her. I will need to look into why she has not received her home monitoring device. 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 Eyes a chest pain or worsening shortness of breath. As mentioned recent laboratory work from her primary shows excellent cholesterol control with total cholesterol 174, triglycerides 124, HDL 59 and LDL of 90.  At the pleasure seeing Deborah Chung  back in the office today. She reports doing fairly well. She still has some issues with swelling in her legs and discomfort with achiness and heaviness. She's had bilateral knee surgeries and is noted to have some varicose veins as well as some discoloration over her ankles. From a cardiac standpoint she had an echo which shows preserved systolic  function, this represents a marked improvement since her EF was reported around 10% at one point but this may been after cardiac arrest when she got her pacemaker placed. Pacemaker function appears to be normal. She's had little if any atrial fibrillation and is on Eliquis for a CHADSVASC score of 4.  I saw Deborah Chung back today in the office. Overall she is feeling well. She denies any worsening shortness of breath or swelling. EF is now normalized. She follows with Dr. Ladona Ridgel for pacemaker. She's had no bleeding problems on Eliquis. She is on low-dose digoxin which will require monitoring. She is overdue for cholesterol check.  08/21/2015  Deborah Chung returns today for follow-up. In the interim she had a pacer remote check with a stable burden of atrial fibrillation. She continues to do well on Eliquis. We discontinued her digoxin and her last office visit for a borderline elevated digoxin level and the fact that it is really not indicated for A. fib and her congestive heart failure has improved with normal LV function. Blood pressure is well-controlled today. Her only other complaint is that she's had diarrhea for approximately 7 days. There is no fever or chills associated with that. He came on abruptly. She denied any sick contacts. She is using Imodium for this.  03/05/2016  Deborah Chung returns today for follow-up. Overall she feels well. She had recent adjustment in her pacemaker when she saw Dr. Ladona Ridgel and is atrial paced today is 70. Blood pressure is normal 118/62. She denies any bleeding problems on L a course. She is not aware of her A. fib. Recently she had a bout with diarrhea was found to have a pancreatic insufficiency. She is on Creon.  09/03/2078  Deborah Chung was seen today in follow-up. Her main complaint is bilateral knee pain. She has a history of knee replacement the past but her surgeon now lives in [place]. She's hesitant to do anything different about it. She's not as active  as she's been in the past. She continues to have some problems with pancreatic insufficiency and pain associated with that. She denies any chest pain or worsening shortness of breath. Her pacemaker remote checks of been stable. The last was in May which showed a very low burden of A. fib of only 2%.  09/10/2017  Deborah Chung is seen today in follow-up.  Overall she has no significant complaints.  She has had a well-functioning pacemaker which has been followed by Dr. Ladona Ridgel.  Remote pacer checks have shown normal findings.  She still struggles with some GI issues and is followed by Dr. Christella Hartigan.  Blood pressure is well controlled today 110/60.  EKG shows an atrial paced rhythm.  She denies any symptomatic atrial fibrillation.  She has had no bleeding problems on Eliquis and denies any frequent falls or head injuries.  03/14/2018  Deborah Chung is seen today for follow-up.  Recently she has had remote pacer checks which continues showed normal findings.  Her blood pressure is well controlled today.  Her next pacer check is in May.  She had recent labs which showed an LDL of 80, which is reasonable control.  With regards to AF she is on Eliquis 2.5 mg twice daily.  She is also on low-dose aspirin.  We discussed this a little further and I feel that there is no additional indication for that.  She takes pravastatin 20 mg daily.  02/15/2019  Deborah Chung returns for follow-up.  She is accompanied by her daughter-in-law.  She is reporting some left ankle pain and difficulty walking.  She is noted to have somewhat of a shuffling gait and uses a walker.  According to her daughter-in-law she has had some more shortness of breath.  She has had several falls including one last month.  There was one fall that caused some bruising over her head and she had a CT scan which was negative for intracranial bleed since she is on Eliquis.  Her hearing is quite reduced.  Blood pressure is normal today.  Her recent remote check  showed normal pacemaker function with a low burden of A. fib of about 3%.  She has a remote history of cardiomyopathy however EF was normal as of last echo.  11/05/2019  Deborah Chung seen today in follow-up.  She is accompanied by her daughter-in-law again.  She has had some worsening lower extremity edema.  She just saw her PCP.  They really did not address this.  She was noted to have well-controlled blood pressure and A. fib which is rate controlled on Eliquis.  Before we talked about shortness of breath.  This is a persistent issue.  I recommended an echo but they were not too excited about doing it mostly because of Covid.  At this point she seems that she would be interested in the echo.  Additionally she is noted to be on some lower dose diuretics.  It was in 2016 which showed normal EF 55 to 60% with some diastolic dysfunction.  01/27/2020  Deborah Chung was seen today in follow-up via video visit.  Recently she had been in the ER on Christmas for a fall.  She has had several falls and has had some progressive dementia.  She still struggles with lower extremity edema however lab work after increasing her torsemide in October showed a low BNP and increasing creatinine therefore I decreased it back to 10 mg daily.  Echo actually showed normal systolic function without evidence for elevated filling pressures.  She was also noted to have a urinary tract infection and this seems to be a chronic issue and at this point she may be colonized.  The patient does not have symptoms concerning for COVID-19 infection (fever, chills, cough, or new SHORTNESS OF BREATH).    Prior CV studies:   The following studies were reviewed today:  Reviewed  PMHx:  Past Medical History:  Diagnosis Date  . Anxiety   . Arthritis   . Atrial fibrillation (HCC)   . Blood transfusion without reported diagnosis   . GAD (generalized anxiety disorder)   . Hearing loss   . Hyperlipidemia   . Hypertension   . Insomnia   .  Pacemaker   . PHN (postherpetic neuralgia) 2010  . Systolic CHF [facility](HCC)     Past Surgical History:  Procedure Laterality Date  . ABDOMINAL HYSTERECTOMY    . PACEMAKER PLACEMENT    . TOTAL KNEE ARTHROPLASTY Bilateral     FAMHx:  Family History  Problem Relation Age of Onset  . Heart disease Father   . Heart disease Sister     SOCHx:   reports that she quit smoking about  31 years ago. She quit after 25.00 years of use. She has never used smokeless tobacco. She reports previous alcohol use. She reports that she does not use drugs.  ALLERGIES:  No Known Allergies  MEDS:  Current Meds  Medication Sig  . acetaminophen (TYLENOL) 325 MG tablet Take 650 mg by mouth 2 (two) times daily as needed (pain).  Marland Kitchen ALPRAZolam (XANAX) 0.25 MG tablet TAKE 1 TABLET BY MOUTH AS NEEDED FOR ANXIETY (Patient taking differently: Take 0.25 mg by mouth daily as needed for anxiety.)  . apixaban (ELIQUIS) 2.5 MG TABS tablet Take 2.5 mg by mouth 2 (two) times daily.  Marland Kitchen aspirin 81 MG chewable tablet Chew 81 mg by mouth daily.  . Biotin 68032 MCG TABS Take 10,000 mcg by mouth daily.  . calcium carbonate (TUMS) 500 MG chewable tablet Chew 1 tablet (200 mg of elemental calcium total) by mouth 2 (two) times daily as needed for indigestion or heartburn.  . cephALEXin (KEFLEX) 500 MG capsule Take 1 capsule (500 mg total) by mouth 4 (four) times daily.  . diphenoxylate-atropine (LOMOTIL) 2.5-0.025 MG tablet Take 1 tablet by mouth every 6 (six) hours as needed for diarrhea or loose stools.  . DULoxetine (CYMBALTA) 60 MG capsule Take 1 capsule (60 mg total) by mouth daily.  Marland Kitchen esomeprazole (NEXIUM) 20 MG capsule Take 1 capsule (20 mg total) by mouth daily at 12 noon.  . famotidine (PEPCID) 20 MG tablet Take 20 mg by mouth daily at 6 (six) AM.  . guaiFENesin (ROBITUSSIN) 100 MG/5ML liquid Take 200 mg by mouth every 4 (four) hours as needed for cough.  . lipase/protease/amylase (CREON) 36000 UNITS CPEP capsule Take 2  capsules with each meal and 1 capsule with each snack (Patient taking differently: Take 72,000 Units by mouth 3 (three) times daily before meals.)  . loratadine (CLARITIN) 10 MG tablet Take 10 mg by mouth daily.  . metoprolol tartrate (LOPRESSOR) 25 MG tablet TAKE 1 TABLET BY MOUTH TWICE A DAY (Patient taking differently: Take 25 mg by mouth 2 (two) times daily. (BETA BLOCKER))  . neomycin-bacitracin-polymyxin (NEOSPORIN) ointment Apply 1 application topically See admin instructions. Ordered 12/13/2019: Cleanse skin tear on left hand close to the wrist with soap and water. Pat to dry. Apply thin layer of triple antibiotic oint, cover with bandaid or non-adhesive dressing once daily until healed, then discontinue.  . ondansetron (ZOFRAN) 4 MG tablet Take 4 mg by mouth every 4 (four) hours as needed for nausea or vomiting.  . polyvinyl alcohol (LIQUIFILM TEARS) 1.4 % ophthalmic solution Place 1 drop into both eyes 4 (four) times daily as needed for dry eyes.  . pregabalin (LYRICA) 75 MG capsule Take 1 capsule (75 mg total) by mouth 3 (three) times daily.  . Probiotic Product (PROBIOTIC PO) Take 1 capsule by mouth daily.  . QUEtiapine (SEROQUEL) 100 MG tablet Take 1 tablet (100 mg total) by mouth at bedtime.  Marland Kitchen spironolactone (ALDACTONE) 25 MG tablet TAKE 1 TABLET BY MOUTH EVERY DAY (Patient taking differently: Take 25 mg by mouth daily.)  . torsemide (DEMADEX) 10 MG tablet Take 10 mg by mouth daily.  . traZODone (DESYREL) 50 MG tablet TAKE 1 TABLET (50 MG TOTAL) BY MOUTH AT BEDTIME AS NEEDED FOR SLEEP. (Patient taking differently: Take 50 mg by mouth at bedtime as needed for sleep.)     ROS: Pertinent items noted in HPI and remainder of comprehensive ROS otherwise negative.  Labs/Other Tests and Data Reviewed:    Recent Labs: 04/21/2019:  TSH 1.45 11/26/2019: NT-Pro BNP 205 01/08/2020: ALT 11; BUN 26; Creatinine, Ser 1.38; Hemoglobin 12.2; Platelets 211; Potassium 4.6; Sodium 141   Recent Lipid  Panel Lab Results  Component Value Date/Time   CHOL 192 04/21/2019 01:57 PM   TRIG 198.0 (H) 04/21/2019 01:57 PM   HDL 43.40 04/21/2019 01:57 PM   CHOLHDL 4 04/21/2019 01:57 PM   LDLCALC 109 (H) 04/21/2019 01:57 PM   LDLDIRECT 106.0 05/16/2014 02:22 PM    Wt Readings from Last 3 Encounters:  01/08/20 208 lb 15.9 oz (94.8 kg)  11/05/19 209 lb (94.8 kg)  11/01/19 200 lb (90.7 kg)     Exam:    Vital Signs:  BP 128/67   Pulse 73    General appearance: alert, appears older than stated age and no distress Lungs: no visual respiratory distress Abdomen: mildly overweight Extremities: edema 1-2+ bilateral edema Skin: Skin color, texture, turgor normal. No rashes or lesions Neurologic: Mental status: Awake, gets confused, hard of hearing Psych: Pleasant  ASSESSMENT & PLAN:    1. Dyspnea on exertion with lower extremity edema 2. History of atrial fibrillation on anticoagulation with Eliquis (CHADVASC 4) 3. Hypertension 4. Dyslipidemia 5. Status post pacemaker - for sinus node dysfunction and complete heart block 6. Reported history of EF of 10-15%, now improved to 55-60% (2016)  Deborah Chung reports no change in her dyspnea.  She has had falls which are primarily mechanical.  She has persistent swelling although her BNP was low and her echo showed no evidence of elevated filling pressure.  We will continue her current dose of diuretic but I was hesitant to increase it further because she had elevations in her creatinine.  She also was found to have urinary tract infection which has been difficult to eradicate and may be colonized at this point.  Plan follow-up with me in 6 months or sooner as necessary.  COVID-19 Education: The signs and symptoms of COVID-19 were discussed with the patient and how to seek care for testing (follow up with PCP or arrange E-visit).  The importance of social distancing was discussed today.  Patient Risk:   After full review of this patients clinical  status, I feel that they are at least moderate risk at this time.  Time:   Today, I have spent 25 minutes with the patient with telehealth technology discussing doe, afib, pacmaker, hypertension, falls, chronic diastolic HF.     Medication Adjustments/Labs and Tests Ordered: Current medicines are reviewed at length with the patient today.  Concerns regarding medicines are outlined above.   Tests Ordered: No orders of the defined types were placed in this encounter.   Medication Changes: No orders of the defined types were placed in this encounter.   Disposition:  in 6 month(s)  Chrystie Nose, MD, [facility], FACP  [place]  [facility]  Medical Director of the Advanced Lipid Disorders &  Cardiovascular Risk Reduction Clinic Diplomate of the American Board of Clinical Lipidology Attending Cardiologist  Direct Dial: [phone]  Fax: [phone]  Website:  [email or web address]  Chrystie Nose, MD  01/27/2020 1:58 PM

## 2020-01-28 DIAGNOSIS — R296 Repeated falls: Secondary | ICD-10-CM | POA: Diagnosis not present

## 2020-01-28 DIAGNOSIS — R2681 Unsteadiness on feet: Secondary | ICD-10-CM | POA: Diagnosis not present

## 2020-01-28 DIAGNOSIS — M6281 Muscle weakness (generalized): Secondary | ICD-10-CM | POA: Diagnosis not present

## 2020-01-28 DIAGNOSIS — R488 Other symbolic dysfunctions: Secondary | ICD-10-CM | POA: Diagnosis not present

## 2020-01-28 DIAGNOSIS — R262 Difficulty in walking, not elsewhere classified: Secondary | ICD-10-CM | POA: Diagnosis not present

## 2020-01-28 DIAGNOSIS — M25511 Pain in right shoulder: Secondary | ICD-10-CM | POA: Diagnosis not present

## 2020-01-28 DIAGNOSIS — M25512 Pain in left shoulder: Secondary | ICD-10-CM | POA: Diagnosis not present

## 2020-01-31 DIAGNOSIS — R262 Difficulty in walking, not elsewhere classified: Secondary | ICD-10-CM | POA: Diagnosis not present

## 2020-01-31 DIAGNOSIS — Z20828 Contact with and (suspected) exposure to other viral communicable diseases: Secondary | ICD-10-CM | POA: Diagnosis not present

## 2020-01-31 DIAGNOSIS — R2681 Unsteadiness on feet: Secondary | ICD-10-CM | POA: Diagnosis not present

## 2020-01-31 DIAGNOSIS — F0390 Unspecified dementia without behavioral disturbance: Secondary | ICD-10-CM | POA: Diagnosis not present

## 2020-01-31 DIAGNOSIS — M6281 Muscle weakness (generalized): Secondary | ICD-10-CM | POA: Diagnosis not present

## 2020-01-31 DIAGNOSIS — Z1159 Encounter for screening for other viral diseases: Secondary | ICD-10-CM | POA: Diagnosis not present

## 2020-01-31 DIAGNOSIS — M25511 Pain in right shoulder: Secondary | ICD-10-CM | POA: Diagnosis not present

## 2020-01-31 DIAGNOSIS — M25512 Pain in left shoulder: Secondary | ICD-10-CM | POA: Diagnosis not present

## 2020-01-31 DIAGNOSIS — R296 Repeated falls: Secondary | ICD-10-CM | POA: Diagnosis not present

## 2020-01-31 DIAGNOSIS — R488 Other symbolic dysfunctions: Secondary | ICD-10-CM | POA: Diagnosis not present

## 2020-01-31 DIAGNOSIS — I5030 Unspecified diastolic (congestive) heart failure: Secondary | ICD-10-CM | POA: Diagnosis not present

## 2020-02-01 DIAGNOSIS — M25512 Pain in left shoulder: Secondary | ICD-10-CM | POA: Diagnosis not present

## 2020-02-01 DIAGNOSIS — R296 Repeated falls: Secondary | ICD-10-CM | POA: Diagnosis not present

## 2020-02-01 DIAGNOSIS — M25511 Pain in right shoulder: Secondary | ICD-10-CM | POA: Diagnosis not present

## 2020-02-01 DIAGNOSIS — R262 Difficulty in walking, not elsewhere classified: Secondary | ICD-10-CM | POA: Diagnosis not present

## 2020-02-01 DIAGNOSIS — M6281 Muscle weakness (generalized): Secondary | ICD-10-CM | POA: Diagnosis not present

## 2020-02-01 DIAGNOSIS — R488 Other symbolic dysfunctions: Secondary | ICD-10-CM | POA: Diagnosis not present

## 2020-02-01 DIAGNOSIS — R2681 Unsteadiness on feet: Secondary | ICD-10-CM | POA: Diagnosis not present

## 2020-02-03 DIAGNOSIS — M25511 Pain in right shoulder: Secondary | ICD-10-CM | POA: Diagnosis not present

## 2020-02-03 DIAGNOSIS — Z20828 Contact with and (suspected) exposure to other viral communicable diseases: Secondary | ICD-10-CM | POA: Diagnosis not present

## 2020-02-03 DIAGNOSIS — R262 Difficulty in walking, not elsewhere classified: Secondary | ICD-10-CM | POA: Diagnosis not present

## 2020-02-03 DIAGNOSIS — R2681 Unsteadiness on feet: Secondary | ICD-10-CM | POA: Diagnosis not present

## 2020-02-03 DIAGNOSIS — Z1159 Encounter for screening for other viral diseases: Secondary | ICD-10-CM | POA: Diagnosis not present

## 2020-02-03 DIAGNOSIS — M25512 Pain in left shoulder: Secondary | ICD-10-CM | POA: Diagnosis not present

## 2020-02-03 DIAGNOSIS — R296 Repeated falls: Secondary | ICD-10-CM | POA: Diagnosis not present

## 2020-02-03 DIAGNOSIS — M6281 Muscle weakness (generalized): Secondary | ICD-10-CM | POA: Diagnosis not present

## 2020-02-03 DIAGNOSIS — R488 Other symbolic dysfunctions: Secondary | ICD-10-CM | POA: Diagnosis not present

## 2020-02-04 DIAGNOSIS — R488 Other symbolic dysfunctions: Secondary | ICD-10-CM | POA: Diagnosis not present

## 2020-02-04 DIAGNOSIS — R296 Repeated falls: Secondary | ICD-10-CM | POA: Diagnosis not present

## 2020-02-04 DIAGNOSIS — M6281 Muscle weakness (generalized): Secondary | ICD-10-CM | POA: Diagnosis not present

## 2020-02-04 DIAGNOSIS — M25512 Pain in left shoulder: Secondary | ICD-10-CM | POA: Diagnosis not present

## 2020-02-04 DIAGNOSIS — M25511 Pain in right shoulder: Secondary | ICD-10-CM | POA: Diagnosis not present

## 2020-02-04 DIAGNOSIS — R2681 Unsteadiness on feet: Secondary | ICD-10-CM | POA: Diagnosis not present

## 2020-02-04 DIAGNOSIS — R262 Difficulty in walking, not elsewhere classified: Secondary | ICD-10-CM | POA: Diagnosis not present

## 2020-02-07 DIAGNOSIS — F0391 Unspecified dementia with behavioral disturbance: Secondary | ICD-10-CM | POA: Diagnosis not present

## 2020-02-07 DIAGNOSIS — Z79899 Other long term (current) drug therapy: Secondary | ICD-10-CM | POA: Diagnosis not present

## 2020-02-07 DIAGNOSIS — R488 Other symbolic dysfunctions: Secondary | ICD-10-CM | POA: Diagnosis not present

## 2020-02-07 DIAGNOSIS — I480 Paroxysmal atrial fibrillation: Secondary | ICD-10-CM | POA: Diagnosis not present

## 2020-02-07 DIAGNOSIS — R296 Repeated falls: Secondary | ICD-10-CM | POA: Diagnosis not present

## 2020-02-07 DIAGNOSIS — Z20828 Contact with and (suspected) exposure to other viral communicable diseases: Secondary | ICD-10-CM | POA: Diagnosis not present

## 2020-02-07 DIAGNOSIS — Z7901 Long term (current) use of anticoagulants: Secondary | ICD-10-CM | POA: Diagnosis not present

## 2020-02-07 DIAGNOSIS — R2681 Unsteadiness on feet: Secondary | ICD-10-CM | POA: Diagnosis not present

## 2020-02-07 DIAGNOSIS — Z1159 Encounter for screening for other viral diseases: Secondary | ICD-10-CM | POA: Diagnosis not present

## 2020-02-07 DIAGNOSIS — M6281 Muscle weakness (generalized): Secondary | ICD-10-CM | POA: Diagnosis not present

## 2020-02-07 DIAGNOSIS — R262 Difficulty in walking, not elsewhere classified: Secondary | ICD-10-CM | POA: Diagnosis not present

## 2020-02-07 DIAGNOSIS — M25511 Pain in right shoulder: Secondary | ICD-10-CM | POA: Diagnosis not present

## 2020-02-07 DIAGNOSIS — B379 Candidiasis, unspecified: Secondary | ICD-10-CM | POA: Diagnosis not present

## 2020-02-07 DIAGNOSIS — M25512 Pain in left shoulder: Secondary | ICD-10-CM | POA: Diagnosis not present

## 2020-02-08 DIAGNOSIS — M25512 Pain in left shoulder: Secondary | ICD-10-CM | POA: Diagnosis not present

## 2020-02-08 DIAGNOSIS — M25511 Pain in right shoulder: Secondary | ICD-10-CM | POA: Diagnosis not present

## 2020-02-08 DIAGNOSIS — R296 Repeated falls: Secondary | ICD-10-CM | POA: Diagnosis not present

## 2020-02-08 DIAGNOSIS — R262 Difficulty in walking, not elsewhere classified: Secondary | ICD-10-CM | POA: Diagnosis not present

## 2020-02-08 DIAGNOSIS — M6281 Muscle weakness (generalized): Secondary | ICD-10-CM | POA: Diagnosis not present

## 2020-02-08 DIAGNOSIS — R2681 Unsteadiness on feet: Secondary | ICD-10-CM | POA: Diagnosis not present

## 2020-02-08 DIAGNOSIS — R488 Other symbolic dysfunctions: Secondary | ICD-10-CM | POA: Diagnosis not present

## 2020-02-10 DIAGNOSIS — Z1159 Encounter for screening for other viral diseases: Secondary | ICD-10-CM | POA: Diagnosis not present

## 2020-02-10 DIAGNOSIS — N390 Urinary tract infection, site not specified: Secondary | ICD-10-CM | POA: Diagnosis not present

## 2020-02-10 DIAGNOSIS — Z20828 Contact with and (suspected) exposure to other viral communicable diseases: Secondary | ICD-10-CM | POA: Diagnosis not present

## 2020-02-10 DIAGNOSIS — Z79899 Other long term (current) drug therapy: Secondary | ICD-10-CM | POA: Diagnosis not present

## 2020-02-11 DIAGNOSIS — R2681 Unsteadiness on feet: Secondary | ICD-10-CM | POA: Diagnosis not present

## 2020-02-11 DIAGNOSIS — M25511 Pain in right shoulder: Secondary | ICD-10-CM | POA: Diagnosis not present

## 2020-02-11 DIAGNOSIS — R262 Difficulty in walking, not elsewhere classified: Secondary | ICD-10-CM | POA: Diagnosis not present

## 2020-02-11 DIAGNOSIS — R488 Other symbolic dysfunctions: Secondary | ICD-10-CM | POA: Diagnosis not present

## 2020-02-11 DIAGNOSIS — M25512 Pain in left shoulder: Secondary | ICD-10-CM | POA: Diagnosis not present

## 2020-02-11 DIAGNOSIS — R296 Repeated falls: Secondary | ICD-10-CM | POA: Diagnosis not present

## 2020-02-11 DIAGNOSIS — M6281 Muscle weakness (generalized): Secondary | ICD-10-CM | POA: Diagnosis not present

## 2020-02-14 DIAGNOSIS — R2681 Unsteadiness on feet: Secondary | ICD-10-CM | POA: Diagnosis not present

## 2020-02-14 DIAGNOSIS — R488 Other symbolic dysfunctions: Secondary | ICD-10-CM | POA: Diagnosis not present

## 2020-02-14 DIAGNOSIS — M25512 Pain in left shoulder: Secondary | ICD-10-CM | POA: Diagnosis not present

## 2020-02-14 DIAGNOSIS — R296 Repeated falls: Secondary | ICD-10-CM | POA: Diagnosis not present

## 2020-02-14 DIAGNOSIS — M25511 Pain in right shoulder: Secondary | ICD-10-CM | POA: Diagnosis not present

## 2020-02-14 DIAGNOSIS — M6281 Muscle weakness (generalized): Secondary | ICD-10-CM | POA: Diagnosis not present

## 2020-02-14 DIAGNOSIS — Z1159 Encounter for screening for other viral diseases: Secondary | ICD-10-CM | POA: Diagnosis not present

## 2020-02-14 DIAGNOSIS — R262 Difficulty in walking, not elsewhere classified: Secondary | ICD-10-CM | POA: Diagnosis not present

## 2020-02-14 DIAGNOSIS — Z20828 Contact with and (suspected) exposure to other viral communicable diseases: Secondary | ICD-10-CM | POA: Diagnosis not present

## 2020-02-16 DIAGNOSIS — R2681 Unsteadiness on feet: Secondary | ICD-10-CM | POA: Diagnosis not present

## 2020-02-16 DIAGNOSIS — R296 Repeated falls: Secondary | ICD-10-CM | POA: Diagnosis not present

## 2020-02-16 DIAGNOSIS — R262 Difficulty in walking, not elsewhere classified: Secondary | ICD-10-CM | POA: Diagnosis not present

## 2020-02-16 DIAGNOSIS — R488 Other symbolic dysfunctions: Secondary | ICD-10-CM | POA: Diagnosis not present

## 2020-02-16 DIAGNOSIS — M25512 Pain in left shoulder: Secondary | ICD-10-CM | POA: Diagnosis not present

## 2020-02-16 DIAGNOSIS — M6281 Muscle weakness (generalized): Secondary | ICD-10-CM | POA: Diagnosis not present

## 2020-02-16 DIAGNOSIS — M25511 Pain in right shoulder: Secondary | ICD-10-CM | POA: Diagnosis not present

## 2020-02-17 DIAGNOSIS — Z20828 Contact with and (suspected) exposure to other viral communicable diseases: Secondary | ICD-10-CM | POA: Diagnosis not present

## 2020-02-17 DIAGNOSIS — Z1159 Encounter for screening for other viral diseases: Secondary | ICD-10-CM | POA: Diagnosis not present

## 2020-02-18 DIAGNOSIS — M25511 Pain in right shoulder: Secondary | ICD-10-CM | POA: Diagnosis not present

## 2020-02-18 DIAGNOSIS — M6281 Muscle weakness (generalized): Secondary | ICD-10-CM | POA: Diagnosis not present

## 2020-02-18 DIAGNOSIS — R488 Other symbolic dysfunctions: Secondary | ICD-10-CM | POA: Diagnosis not present

## 2020-02-18 DIAGNOSIS — R262 Difficulty in walking, not elsewhere classified: Secondary | ICD-10-CM | POA: Diagnosis not present

## 2020-02-18 DIAGNOSIS — R2681 Unsteadiness on feet: Secondary | ICD-10-CM | POA: Diagnosis not present

## 2020-02-18 DIAGNOSIS — M25512 Pain in left shoulder: Secondary | ICD-10-CM | POA: Diagnosis not present

## 2020-02-18 DIAGNOSIS — R296 Repeated falls: Secondary | ICD-10-CM | POA: Diagnosis not present

## 2020-02-21 DIAGNOSIS — W19XXXA Unspecified fall, initial encounter: Secondary | ICD-10-CM | POA: Diagnosis not present

## 2020-02-21 DIAGNOSIS — Z20828 Contact with and (suspected) exposure to other viral communicable diseases: Secondary | ICD-10-CM | POA: Diagnosis not present

## 2020-02-21 DIAGNOSIS — Z79899 Other long term (current) drug therapy: Secondary | ICD-10-CM | POA: Diagnosis not present

## 2020-02-21 DIAGNOSIS — F0391 Unspecified dementia with behavioral disturbance: Secondary | ICD-10-CM | POA: Diagnosis not present

## 2020-02-21 DIAGNOSIS — Z1159 Encounter for screening for other viral diseases: Secondary | ICD-10-CM | POA: Diagnosis not present

## 2020-02-21 DIAGNOSIS — F5101 Primary insomnia: Secondary | ICD-10-CM | POA: Diagnosis not present

## 2020-02-22 DIAGNOSIS — R488 Other symbolic dysfunctions: Secondary | ICD-10-CM | POA: Diagnosis not present

## 2020-02-22 DIAGNOSIS — R262 Difficulty in walking, not elsewhere classified: Secondary | ICD-10-CM | POA: Diagnosis not present

## 2020-02-22 DIAGNOSIS — M25511 Pain in right shoulder: Secondary | ICD-10-CM | POA: Diagnosis not present

## 2020-02-22 DIAGNOSIS — R2681 Unsteadiness on feet: Secondary | ICD-10-CM | POA: Diagnosis not present

## 2020-02-22 DIAGNOSIS — M25512 Pain in left shoulder: Secondary | ICD-10-CM | POA: Diagnosis not present

## 2020-02-22 DIAGNOSIS — M6281 Muscle weakness (generalized): Secondary | ICD-10-CM | POA: Diagnosis not present

## 2020-02-22 DIAGNOSIS — R296 Repeated falls: Secondary | ICD-10-CM | POA: Diagnosis not present

## 2020-02-23 ENCOUNTER — Ambulatory Visit (INDEPENDENT_AMBULATORY_CARE_PROVIDER_SITE_OTHER): Payer: Medicare PPO

## 2020-02-23 DIAGNOSIS — I442 Atrioventricular block, complete: Secondary | ICD-10-CM | POA: Diagnosis not present

## 2020-02-24 DIAGNOSIS — Z1159 Encounter for screening for other viral diseases: Secondary | ICD-10-CM | POA: Diagnosis not present

## 2020-02-24 DIAGNOSIS — Z20828 Contact with and (suspected) exposure to other viral communicable diseases: Secondary | ICD-10-CM | POA: Diagnosis not present

## 2020-02-25 DIAGNOSIS — R2681 Unsteadiness on feet: Secondary | ICD-10-CM | POA: Diagnosis not present

## 2020-02-25 DIAGNOSIS — R296 Repeated falls: Secondary | ICD-10-CM | POA: Diagnosis not present

## 2020-02-25 DIAGNOSIS — M6281 Muscle weakness (generalized): Secondary | ICD-10-CM | POA: Diagnosis not present

## 2020-02-25 DIAGNOSIS — R488 Other symbolic dysfunctions: Secondary | ICD-10-CM | POA: Diagnosis not present

## 2020-02-25 DIAGNOSIS — M25512 Pain in left shoulder: Secondary | ICD-10-CM | POA: Diagnosis not present

## 2020-02-25 DIAGNOSIS — M25511 Pain in right shoulder: Secondary | ICD-10-CM | POA: Diagnosis not present

## 2020-02-25 DIAGNOSIS — R262 Difficulty in walking, not elsewhere classified: Secondary | ICD-10-CM | POA: Diagnosis not present

## 2020-02-26 LAB — CUP PACEART REMOTE DEVICE CHECK
Battery Remaining Longevity: 24 mo
Battery Remaining Percentage: 44 %
Brady Statistic RA Percent Paced: 82 %
Brady Statistic RV Percent Paced: 2 %
Date Time Interrogation Session: 20220209031100
Implantable Lead Implant Date: 20150112
Implantable Lead Implant Date: 20150112
Implantable Lead Location: 753859
Implantable Lead Location: 753860
Implantable Lead Model: 4135
Implantable Lead Model: 4136
Implantable Lead Serial Number: 29308444
Implantable Lead Serial Number: 29569556
Implantable Pulse Generator Implant Date: 20150112
Lead Channel Impedance Value: 588 Ohm
Lead Channel Impedance Value: 875 Ohm
Lead Channel Pacing Threshold Amplitude: 0.6 V
Lead Channel Pacing Threshold Amplitude: 0.8 V
Lead Channel Pacing Threshold Pulse Width: 0.4 ms
Lead Channel Pacing Threshold Pulse Width: 0.5 ms
Lead Channel Setting Pacing Amplitude: 1.1 V
Lead Channel Setting Pacing Amplitude: 2.4 V
Lead Channel Setting Pacing Pulse Width: 0.4 ms
Lead Channel Setting Sensing Sensitivity: 3 mV
Pulse Gen Serial Number: 391753

## 2020-02-28 DIAGNOSIS — Z20828 Contact with and (suspected) exposure to other viral communicable diseases: Secondary | ICD-10-CM | POA: Diagnosis not present

## 2020-02-28 DIAGNOSIS — Z1159 Encounter for screening for other viral diseases: Secondary | ICD-10-CM | POA: Diagnosis not present

## 2020-02-28 DIAGNOSIS — L304 Erythema intertrigo: Secondary | ICD-10-CM | POA: Diagnosis not present

## 2020-02-28 DIAGNOSIS — I5040 Unspecified combined systolic (congestive) and diastolic (congestive) heart failure: Secondary | ICD-10-CM | POA: Diagnosis not present

## 2020-02-28 DIAGNOSIS — I4891 Unspecified atrial fibrillation: Secondary | ICD-10-CM | POA: Diagnosis not present

## 2020-02-28 DIAGNOSIS — F0391 Unspecified dementia with behavioral disturbance: Secondary | ICD-10-CM | POA: Diagnosis not present

## 2020-02-28 DIAGNOSIS — Z79899 Other long term (current) drug therapy: Secondary | ICD-10-CM | POA: Diagnosis not present

## 2020-02-29 DIAGNOSIS — M25512 Pain in left shoulder: Secondary | ICD-10-CM | POA: Diagnosis not present

## 2020-02-29 DIAGNOSIS — R262 Difficulty in walking, not elsewhere classified: Secondary | ICD-10-CM | POA: Diagnosis not present

## 2020-02-29 DIAGNOSIS — M6281 Muscle weakness (generalized): Secondary | ICD-10-CM | POA: Diagnosis not present

## 2020-02-29 DIAGNOSIS — R488 Other symbolic dysfunctions: Secondary | ICD-10-CM | POA: Diagnosis not present

## 2020-02-29 DIAGNOSIS — R2681 Unsteadiness on feet: Secondary | ICD-10-CM | POA: Diagnosis not present

## 2020-02-29 DIAGNOSIS — R296 Repeated falls: Secondary | ICD-10-CM | POA: Diagnosis not present

## 2020-02-29 DIAGNOSIS — M25511 Pain in right shoulder: Secondary | ICD-10-CM | POA: Diagnosis not present

## 2020-02-29 NOTE — Progress Notes (Signed)
Remote pacemaker transmission.   

## 2020-03-01 DIAGNOSIS — R488 Other symbolic dysfunctions: Secondary | ICD-10-CM | POA: Diagnosis not present

## 2020-03-01 DIAGNOSIS — M25511 Pain in right shoulder: Secondary | ICD-10-CM | POA: Diagnosis not present

## 2020-03-01 DIAGNOSIS — R2681 Unsteadiness on feet: Secondary | ICD-10-CM | POA: Diagnosis not present

## 2020-03-01 DIAGNOSIS — R262 Difficulty in walking, not elsewhere classified: Secondary | ICD-10-CM | POA: Diagnosis not present

## 2020-03-01 DIAGNOSIS — M6281 Muscle weakness (generalized): Secondary | ICD-10-CM | POA: Diagnosis not present

## 2020-03-01 DIAGNOSIS — R296 Repeated falls: Secondary | ICD-10-CM | POA: Diagnosis not present

## 2020-03-01 DIAGNOSIS — M25512 Pain in left shoulder: Secondary | ICD-10-CM | POA: Diagnosis not present

## 2020-03-02 DIAGNOSIS — I5030 Unspecified diastolic (congestive) heart failure: Secondary | ICD-10-CM | POA: Diagnosis not present

## 2020-03-02 DIAGNOSIS — F0390 Unspecified dementia without behavioral disturbance: Secondary | ICD-10-CM | POA: Diagnosis not present

## 2020-03-06 DIAGNOSIS — Z1159 Encounter for screening for other viral diseases: Secondary | ICD-10-CM | POA: Diagnosis not present

## 2020-03-06 DIAGNOSIS — Z20828 Contact with and (suspected) exposure to other viral communicable diseases: Secondary | ICD-10-CM | POA: Diagnosis not present

## 2020-03-07 DIAGNOSIS — N390 Urinary tract infection, site not specified: Secondary | ICD-10-CM | POA: Diagnosis not present

## 2020-03-07 DIAGNOSIS — Z79899 Other long term (current) drug therapy: Secondary | ICD-10-CM | POA: Diagnosis not present

## 2020-03-09 DIAGNOSIS — Z20828 Contact with and (suspected) exposure to other viral communicable diseases: Secondary | ICD-10-CM | POA: Diagnosis not present

## 2020-03-09 DIAGNOSIS — Z1159 Encounter for screening for other viral diseases: Secondary | ICD-10-CM | POA: Diagnosis not present

## 2020-03-13 DIAGNOSIS — Z20828 Contact with and (suspected) exposure to other viral communicable diseases: Secondary | ICD-10-CM | POA: Diagnosis not present

## 2020-03-13 DIAGNOSIS — Z1159 Encounter for screening for other viral diseases: Secondary | ICD-10-CM | POA: Diagnosis not present

## 2020-03-16 ENCOUNTER — Emergency Department (HOSPITAL_COMMUNITY)
Admission: EM | Admit: 2020-03-16 | Discharge: 2020-03-16 | Disposition: A | Discharge status: Home / Self Care | Payer: Medicare PPO | Attending: Emergency Medicine | Admitting: Emergency Medicine

## 2020-03-16 ENCOUNTER — Emergency Department (HOSPITAL_COMMUNITY): Payer: Medicare PPO

## 2020-03-16 ENCOUNTER — Encounter (HOSPITAL_COMMUNITY): Payer: Self-pay | Admitting: Emergency Medicine

## 2020-03-16 ENCOUNTER — Other Ambulatory Visit: Payer: Self-pay

## 2020-03-16 DIAGNOSIS — W19XXXA Unspecified fall, initial encounter: Secondary | ICD-10-CM

## 2020-03-16 DIAGNOSIS — S0003XA Contusion of scalp, initial encounter: Secondary | ICD-10-CM

## 2020-03-16 DIAGNOSIS — R0789 Other chest pain: Secondary | ICD-10-CM | POA: Insufficient documentation

## 2020-03-16 DIAGNOSIS — F0390 Unspecified dementia without behavioral disturbance: Secondary | ICD-10-CM | POA: Diagnosis not present

## 2020-03-16 DIAGNOSIS — S0990XA Unspecified injury of head, initial encounter: Secondary | ICD-10-CM | POA: Diagnosis present

## 2020-03-16 DIAGNOSIS — Z20822 Contact with and (suspected) exposure to covid-19: Secondary | ICD-10-CM | POA: Diagnosis not present

## 2020-03-16 DIAGNOSIS — W01198A Fall on same level from slipping, tripping and stumbling with subsequent striking against other object, initial encounter: Secondary | ICD-10-CM | POA: Insufficient documentation

## 2020-03-16 DIAGNOSIS — I4891 Unspecified atrial fibrillation: Secondary | ICD-10-CM | POA: Insufficient documentation

## 2020-03-16 DIAGNOSIS — Z7901 Long term (current) use of anticoagulants: Secondary | ICD-10-CM | POA: Diagnosis not present

## 2020-03-16 DIAGNOSIS — I10 Essential (primary) hypertension: Secondary | ICD-10-CM | POA: Insufficient documentation

## 2020-03-16 HISTORY — DX: Hyperlipidemia, unspecified: E78.5

## 2020-03-16 HISTORY — DX: Insomnia, unspecified: G47.00

## 2020-03-16 HISTORY — DX: Depression, unspecified: F32.A

## 2020-03-16 HISTORY — DX: Anxiety disorder, unspecified: F41.9

## 2020-03-16 HISTORY — DX: Unspecified atrial fibrillation: I48.91

## 2020-03-16 HISTORY — DX: Unspecified osteoarthritis, unspecified site: M19.90

## 2020-03-16 HISTORY — DX: Essential (primary) hypertension: I10

## 2020-03-16 LAB — CBC
HCT: 42.6 % (ref 36.0–46.0)
Hemoglobin: 13.1 g/dL (ref 12.0–15.0)
MCH: 29.8 pg (ref 26.0–34.0)
MCHC: 30.8 g/dL (ref 30.0–36.0)
MCV: 97 fL (ref 80.0–100.0)
Platelets: 238 10*3/uL (ref 150–400)
RBC: 4.39 MIL/uL (ref 3.87–5.11)
RDW: 13.6 % (ref 11.5–15.5)
WBC: 8.7 10*3/uL (ref 4.0–10.5)
nRBC: 0 % (ref 0.0–0.2)

## 2020-03-16 LAB — I-STAT CHEM 8, ED
BUN: 28 mg/dL — ABNORMAL HIGH (ref 8–23)
Calcium, Ion: 1.19 mmol/L (ref 1.15–1.40)
Chloride: 99 mmol/L (ref 98–111)
Creatinine, Ser: 1.3 mg/dL — ABNORMAL HIGH (ref 0.44–1.00)
Glucose, Bld: 103 mg/dL — ABNORMAL HIGH (ref 70–99)
HCT: 41 % (ref 36.0–46.0)
Hemoglobin: 13.9 g/dL (ref 12.0–15.0)
Potassium: 4 mmol/L (ref 3.5–5.1)
Sodium: 141 mmol/L (ref 135–145)
TCO2: 31 mmol/L (ref 22–32)

## 2020-03-16 LAB — SAMPLE TO BLOOD BANK

## 2020-03-16 LAB — COMPREHENSIVE METABOLIC PANEL
ALT: 9 U/L (ref 0–44)
AST: 13 U/L — ABNORMAL LOW (ref 15–41)
Albumin: 3.4 g/dL — ABNORMAL LOW (ref 3.5–5.0)
Alkaline Phosphatase: 143 U/L — ABNORMAL HIGH (ref 38–126)
Anion gap: 9 (ref 5–15)
BUN: 23 mg/dL (ref 8–23)
CO2: 30 mmol/L (ref 22–32)
Calcium: 9 mg/dL (ref 8.9–10.3)
Chloride: 102 mmol/L (ref 98–111)
Creatinine, Ser: 1.24 mg/dL — ABNORMAL HIGH (ref 0.44–1.00)
GFR, Estimated: 41 mL/min — ABNORMAL LOW (ref >60)
Glucose, Bld: 106 mg/dL — ABNORMAL HIGH (ref 70–99)
Potassium: 4.1 mmol/L (ref 3.5–5.1)
Sodium: 141 mmol/L (ref 135–145)
Total Bilirubin: 0.7 mg/dL (ref 0.3–1.2)
Total Protein: 6.6 g/dL (ref 6.5–8.1)

## 2020-03-16 LAB — RESP PANEL BY RT-PCR (FLU A&B, COVID) ARPGX2
Influenza A by PCR: NEGATIVE
Influenza B by PCR: NEGATIVE
SARS Coronavirus 2 by RT PCR: NEGATIVE

## 2020-03-16 LAB — LACTIC ACID, PLASMA: Lactic Acid, Venous: 1.4 mmol/L (ref 0.5–1.9)

## 2020-03-16 LAB — URINALYSIS, ROUTINE W REFLEX MICROSCOPIC
Bilirubin Urine: NEGATIVE
Glucose, UA: NEGATIVE mg/dL
Hgb urine dipstick: NEGATIVE
Ketones, ur: NEGATIVE mg/dL
Leukocytes,Ua: NEGATIVE
Nitrite: NEGATIVE
Protein, ur: NEGATIVE mg/dL
Specific Gravity, Urine: 1.01 (ref 1.005–1.030)
pH: 5 (ref 5.0–8.0)

## 2020-03-16 LAB — PROTIME-INR
INR: 1.1 (ref 0.8–1.2)
Prothrombin Time: 13.5 seconds (ref 11.4–15.2)

## 2020-03-16 NOTE — ED Provider Notes (Signed)
[facility] EMERGENCY DEPARTMENT Provider Note   CSN: [id number] Arrival date & time: 03/16/20  1546     History Chief Complaint  Patient presents with  . Fall    Deborah Chung is a 87 y.o. female past medical history of atrial fibrillation on Eliquis, hypertension, hyperlipidemia, osteoarthritis, dementia. Patient presenting from [facility] nursing facility via EMS as a level 2 trauma.  Reports she was walking to get some water and fell backwards.  She hit the back of her head and has hematoma to the posterior aspect of her head.  She takes Eliquis for a fib.  Patient is reported to be at her mental baseline.  Patient denies any complaints other than being thirsty.  She states she was going to get water when she fell.  Denies neck or back pain, abdominal pain, or pain to her extremities.   The history is provided by the patient and the EMS personnel.       Past Medical History:  Diagnosis Date  . A-fib (HCC)   . Anxiety   . Depression   . Hyperlipidemia   . Hypertension   . Insomnia   . Osteoarthritis     There are no problems to display for this patient.   History reviewed. No pertinent surgical history.   OB History   No obstetric history on file.     No family history on file.  Social History   Tobacco Use  . Smoking status: Never Smoker  . Smokeless tobacco: Never Used  Substance Use Topics  . Alcohol use: Not Currently  . Drug use: Never    Home Medications Prior to Admission medications   Not on File    Allergies    Patient has no known allergies.  Review of Systems   Review of Systems  HENT:       Head trauma  Hematological: Bruises/bleeds easily.  All other systems reviewed and are negative.   Physical Exam Updated Vital Signs BP 116/89   Pulse 69   Temp (!) 97.5 F (36.4 C) (Oral)   Resp (!) 21   Ht 5\' 4"  (1.626 m)   Wt 83.9 kg   SpO2 95%   BMI 31.76 kg/m   Physical Exam Vitals and nursing note  reviewed.  Constitutional:      General: She is not in acute distress.    Appearance: She is well-developed and well-nourished. She is obese.  HENT:     Head: Normocephalic.     Comments: Large hematoma to left occiput.  No wounds. Eyes:     Extraocular Movements: Extraocular movements intact.     Conjunctiva/sclera: Conjunctivae normal.     Pupils: Pupils are equal, round, and reactive to light.  Neck:     Comments: C-collar in place per EMS Cardiovascular:     Rate and Rhythm: Normal rate and regular rhythm.  Pulmonary:     Effort: Pulmonary effort is normal. No respiratory distress.     Breath sounds: Normal breath sounds.     Comments: Patient does have some tenderness to the left lateral chest wall without bruising or deformity. Abdominal:     General: Bowel sounds are normal.     Palpations: Abdomen is soft.     Tenderness: There is no abdominal tenderness. There is no guarding or rebound.  Musculoskeletal:     Comments: Patient is spontaneously moving both upper extremities without difficulty.  There is no deformity noted to extremities x4, no bruising.  No tenderness palpation of the extremities, extremities are ranged without any elicited pain.  Pelvis is stable. Patient was rolled maintaining spine precautions, no deformity or tenderness noted to the spine.  No bruising.  Skin:    General: Skin is warm.  Neurological:     Mental Status: She is alert. Mental status is at baseline.     Comments: Patient is oriented to person and place.  Speech is fluent without aphasia.  No obvious facial droop.  Pupils are equal round and reactive.  EOM intact.  Patient is moving her feet on command and endorses normal sensation with light touch bilaterally  Psychiatric:        Mood and Affect: Mood and affect normal.        Behavior: Behavior normal.     ED Results / Procedures / Treatments   Labs (all labs ordered are listed, but only abnormal results are displayed) Labs Reviewed   COMPREHENSIVE METABOLIC PANEL - Abnormal; Notable for the following components:      Result Value   Glucose, Bld 106 (*)    Creatinine, Ser 1.24 (*)    Albumin 3.4 (*)    AST 13 (*)    Alkaline Phosphatase 143 (*)    GFR, Estimated 41 (*)    All other components within normal limits  URINALYSIS, ROUTINE W REFLEX MICROSCOPIC - Abnormal; Notable for the following components:   Color, Urine STRAW (*)    All other components within normal limits  I-STAT CHEM 8, ED - Abnormal; Notable for the following components:   BUN 28 (*)    Creatinine, Ser 1.30 (*)    Glucose, Bld 103 (*)    All other components within normal limits  RESP PANEL BY RT-PCR (FLU A&B, COVID) ARPGX2  CBC  LACTIC ACID, PLASMA  PROTIME-INR  SAMPLE TO BLOOD BANK    EKG EKG Interpretation  Date/Time:  Thursday March 16 2020 15:59:52 EST Ventricular Rate:  70 PR Interval:    QRS Duration: 102 QT Interval:  404 QTC Calculation: 436 R Axis:   75 Text Interpretation: Sinus rhythm Prolonged PR interval normal EKG. no old comparison Confirmed by Arby Barrette [phone]) on 03/16/2020 4:23:44 PM   Radiology CT Head Wo Contrast  Result Date: 03/16/2020 CLINICAL DATA:  Head trauma EXAM: CT HEAD WITHOUT CONTRAST CT CERVICAL SPINE WITHOUT CONTRAST TECHNIQUE: Multidetector CT imaging of the head and cervical spine was performed following the standard protocol without intravenous contrast. Multiplanar CT image reconstructions of the cervical spine were also generated. COMPARISON:  CT head 01/08/2020 FINDINGS: CT HEAD FINDINGS Brain: Moderate atrophy without hydrocephalus. Moderate white matter changes with diffuse white matter hypodensity, similar to the prior study. Negative for acute infarct, intracranial hemorrhage, mass. Vascular: Negative for hyperdense vessel Skull: Negative for skull fracture. Moderately large left parietal scalp hematoma. Sinuses/Orbits: Mild mucosal edema right maxillary sinus otherwise clear sinuses. No  orbital abnormality. Bilateral cataract extraction Other: None CT CERVICAL SPINE FINDINGS Alignment: Anterolisthesis C2-3, C3-4, C4-5. Straightening of the cervical lordosis. Skull base and vertebrae: Negative for fracture. Soft tissues and spinal canal: No acute soft tissue abnormality in the neck. Disc levels: Multilevel disc and facet degeneration. Foraminal narrowing bilaterally at C5-6 due to spurring. Mild spinal stenosis and foraminal stenosis bilaterally at C6-7. Upper chest: Apical scarring bilaterally.  No acute abnormality. Other: None IMPRESSION: 1. No acute intracranial abnormality.  Left parietal scalp hematoma 2. Cervical spine degenerative change.  Negative for fracture. Electronically Signed   By: Marlan Palau  M.D.   On: 03/16/2020 18:09   CT Cervical Spine Wo Contrast  Result Date: 03/16/2020 CLINICAL DATA:  Head trauma EXAM: CT HEAD WITHOUT CONTRAST CT CERVICAL SPINE WITHOUT CONTRAST TECHNIQUE: Multidetector CT imaging of the head and cervical spine was performed following the standard protocol without intravenous contrast. Multiplanar CT image reconstructions of the cervical spine were also generated. COMPARISON:  CT head 01/08/2020 FINDINGS: CT HEAD FINDINGS Brain: Moderate atrophy without hydrocephalus. Moderate white matter changes with diffuse white matter hypodensity, similar to the prior study. Negative for acute infarct, intracranial hemorrhage, mass. Vascular: Negative for hyperdense vessel Skull: Negative for skull fracture. Moderately large left parietal scalp hematoma. Sinuses/Orbits: Mild mucosal edema right maxillary sinus otherwise clear sinuses. No orbital abnormality. Bilateral cataract extraction Other: None CT CERVICAL SPINE FINDINGS Alignment: Anterolisthesis C2-3, C3-4, C4-5. Straightening of the cervical lordosis. Skull base and vertebrae: Negative for fracture. Soft tissues and spinal canal: No acute soft tissue abnormality in the neck. Disc levels: Multilevel disc and  facet degeneration. Foraminal narrowing bilaterally at C5-6 due to spurring. Mild spinal stenosis and foraminal stenosis bilaterally at C6-7. Upper chest: Apical scarring bilaterally.  No acute abnormality. Other: None IMPRESSION: 1. No acute intracranial abnormality.  Left parietal scalp hematoma 2. Cervical spine degenerative change.  Negative for fracture. Electronically Signed   By: Marlan Palau M.D.   On: 03/16/2020 18:09   DG Chest Port 1 View  Result Date: 03/16/2020 CLINICAL DATA:  Larey Seat.  Taking blood thinners. EXAM: PORTABLE CHEST 1 VIEW COMPARISON:  None. FINDINGS: Heart size near the upper limit of normal. Left subclavian bipolar pacemaker leads in satisfactory position. Clear lungs. No fracture or pneumothorax seen. IMPRESSION: No acute abnormality. Electronically Signed   By: Beckie Salts M.D.   On: 03/16/2020 16:18    Procedures Procedures   Medications Ordered in ED Medications - No data to display  ED Course  I have reviewed the triage vital signs and the nursing notes.  Pertinent labs & imaging results that were available during my care of the patient were reviewed by me and considered in my medical decision making (see chart for details).    MDM Rules/Calculators/A&P                          Patient presenting from nursing facility via EMS as a level 2 trauma.  Patient is on anticoagulation for A. fib, fell and hit her head while trying to get water today.  Patient has no complaints, is oriented to person and place, at mental baseline per EMS.  She is noted to have large hematoma to left occiput without wounds.  Has some tenderness to left lateral chest wall without deformity.  Lungs are clear.  She has no focal neuro deficits.  Blood work is ordered.  CT head and C-spine, plain film of the chest.  Imaging is negative for acute intracranial hemorrhage or fracture. C-collar is removed by nursing and patient remains asymptomatic regarding her neck. Blood work is reassuring.  Vital signs remained stable. She is sitting up in bed, pleasant, in no acute distress on reevaluation. Family member is now at bedside. Family feels comfortable with driving her back to her facility is agreeable with work-up and care plan at this time.  Patient discharged in no acute distress.  Final Clinical Impression(s) / ED Diagnoses Final diagnoses:  Fall  Contusion of scalp, initial encounter    Rx / DC Orders ED Discharge Orders  None       Breann Losano, [place] N, PA-C 03/16/20 Windell Moment    Arby Barrette, MD 03/20/20 [phone]

## 2020-03-16 NOTE — ED Provider Notes (Signed)
I provided a substantive portion of the care of this patient.  I personally performed the entirety of the history for this encounter.  EKG Interpretation  Date/Time:  Thursday March 16 2020 15:59:52 EST Ventricular Rate:  70 PR Interval:    QRS Duration: 102 QT Interval:  404 QTC Calculation: 436 R Axis:   75 Text Interpretation: Sinus rhythm Prolonged PR interval normal EKG. no old comparison Confirmed by Arby Barrette [phone]) on 03/16/2020 4:23:44 PM  Patient has history of atrial fibrillation takes Eliquis.  He is coming from [facility] nursing home.  Patient was walking to get water and fell backwards.  She struck her head and has a swelling to the posterior head.  Patient is at mental status baseline.  Denies any headache.  No associated neurologic dysfunction.  Patient is alert and appropriate.  Following commands.  No respiratory distress.  GCS 15.  Movements coordinated purposeful symmetric.  I agree with plan of management.   Arby Barrette, MD 03/16/20 [phone]

## 2020-03-16 NOTE — ED Notes (Signed)
Pt arrives EMS as level 2- fall on blood thinners. Pt is from [facility]. Pt was walking and fell backwards. No LOC- has hematoma to posterior head. Pt is on eliquis. Pt is alert to baseline.

## 2020-03-16 NOTE — Discharge Instructions (Addendum)
Your head and C-spine CTs are not showing any evidence of significant injury today. Your chest x-ray shows no evidence of rib fracture. Apply ice to your areas of pain for 20 minutes at a time. You can take Tylenol every 6 hours. Please follow with primary care provider as needed. Return for any concerning symptoms.

## 2020-03-16 NOTE — ED Notes (Addendum)
Pt in CT. CT beds were full prior to pt going to CT.

## 2020-03-17 ENCOUNTER — Encounter: Payer: Self-pay | Admitting: Internal Medicine

## 2020-04-07 ENCOUNTER — Inpatient Hospital Stay (HOSPITAL_COMMUNITY)
Admission: EM | Admit: 2020-04-07 | Discharge: 2020-04-11 | DRG: 690 | Disposition: A | Discharge status: Skilled Nursing Facility | Payer: Medicare PPO | Attending: Internal Medicine | Admitting: Internal Medicine

## 2020-04-07 ENCOUNTER — Encounter (HOSPITAL_COMMUNITY): Payer: Self-pay | Admitting: Internal Medicine

## 2020-04-07 ENCOUNTER — Other Ambulatory Visit: Payer: Self-pay

## 2020-04-07 ENCOUNTER — Emergency Department (HOSPITAL_COMMUNITY): Payer: Medicare PPO

## 2020-04-07 DIAGNOSIS — F03918 Unspecified dementia, unspecified severity, with other behavioral disturbance: Secondary | ICD-10-CM

## 2020-04-07 DIAGNOSIS — F0392 Unspecified dementia, unspecified severity, with psychotic disturbance: Secondary | ICD-10-CM | POA: Diagnosis present

## 2020-04-07 DIAGNOSIS — R410 Disorientation, unspecified: Secondary | ICD-10-CM

## 2020-04-07 DIAGNOSIS — I4891 Unspecified atrial fibrillation: Secondary | ICD-10-CM | POA: Diagnosis present

## 2020-04-07 DIAGNOSIS — F0391 Unspecified dementia with behavioral disturbance: Secondary | ICD-10-CM | POA: Diagnosis present

## 2020-04-07 DIAGNOSIS — R441 Visual hallucinations: Secondary | ICD-10-CM | POA: Diagnosis present

## 2020-04-07 DIAGNOSIS — I959 Hypotension, unspecified: Secondary | ICD-10-CM | POA: Diagnosis present

## 2020-04-07 DIAGNOSIS — Z8249 Family history of ischemic heart disease and other diseases of the circulatory system: Secondary | ICD-10-CM

## 2020-04-07 DIAGNOSIS — E785 Hyperlipidemia, unspecified: Secondary | ICD-10-CM | POA: Diagnosis present

## 2020-04-07 DIAGNOSIS — N390 Urinary tract infection, site not specified: Secondary | ICD-10-CM | POA: Diagnosis not present

## 2020-04-07 DIAGNOSIS — Z79899 Other long term (current) drug therapy: Secondary | ICD-10-CM

## 2020-04-07 DIAGNOSIS — Z7982 Long term (current) use of aspirin: Secondary | ICD-10-CM

## 2020-04-07 DIAGNOSIS — A419 Sepsis, unspecified organism: Secondary | ICD-10-CM | POA: Diagnosis present

## 2020-04-07 DIAGNOSIS — Z515 Encounter for palliative care: Secondary | ICD-10-CM

## 2020-04-07 DIAGNOSIS — N3000 Acute cystitis without hematuria: Secondary | ICD-10-CM | POA: Diagnosis not present

## 2020-04-07 DIAGNOSIS — F0390 Unspecified dementia without behavioral disturbance: Secondary | ICD-10-CM | POA: Diagnosis present

## 2020-04-07 DIAGNOSIS — I130 Hypertensive heart and chronic kidney disease with heart failure and stage 1 through stage 4 chronic kidney disease, or unspecified chronic kidney disease: Secondary | ICD-10-CM | POA: Diagnosis present

## 2020-04-07 DIAGNOSIS — Z7901 Long term (current) use of anticoagulants: Secondary | ICD-10-CM

## 2020-04-07 DIAGNOSIS — I4821 Permanent atrial fibrillation: Secondary | ICD-10-CM | POA: Diagnosis present

## 2020-04-07 DIAGNOSIS — I5032 Chronic diastolic (congestive) heart failure: Secondary | ICD-10-CM | POA: Diagnosis present

## 2020-04-07 DIAGNOSIS — F411 Generalized anxiety disorder: Secondary | ICD-10-CM | POA: Diagnosis present

## 2020-04-07 DIAGNOSIS — Z9181 History of falling: Secondary | ICD-10-CM

## 2020-04-07 DIAGNOSIS — Z66 Do not resuscitate: Secondary | ICD-10-CM | POA: Diagnosis present

## 2020-04-07 DIAGNOSIS — I10 Essential (primary) hypertension: Secondary | ICD-10-CM | POA: Diagnosis present

## 2020-04-07 DIAGNOSIS — Z96653 Presence of artificial knee joint, bilateral: Secondary | ICD-10-CM | POA: Diagnosis present

## 2020-04-07 DIAGNOSIS — Z90710 Acquired absence of both cervix and uterus: Secondary | ICD-10-CM

## 2020-04-07 DIAGNOSIS — F32A Depression, unspecified: Secondary | ICD-10-CM | POA: Diagnosis present

## 2020-04-07 DIAGNOSIS — N1832 Chronic kidney disease, stage 3b: Secondary | ICD-10-CM | POA: Diagnosis present

## 2020-04-07 DIAGNOSIS — Z20822 Contact with and (suspected) exposure to covid-19: Secondary | ICD-10-CM | POA: Diagnosis present

## 2020-04-07 DIAGNOSIS — Z7189 Other specified counseling: Secondary | ICD-10-CM

## 2020-04-07 DIAGNOSIS — Z950 Presence of cardiac pacemaker: Secondary | ICD-10-CM

## 2020-04-07 LAB — COMPREHENSIVE METABOLIC PANEL
ALT: 9 U/L (ref 0–44)
AST: 15 U/L (ref 15–41)
Albumin: 3.6 g/dL (ref 3.5–5.0)
Alkaline Phosphatase: 140 U/L — ABNORMAL HIGH (ref 38–126)
Anion gap: 7 (ref 5–15)
BUN: 19 mg/dL (ref 8–23)
CO2: 29 mmol/L (ref 22–32)
Calcium: 8.9 mg/dL (ref 8.9–10.3)
Chloride: 100 mmol/L (ref 98–111)
Creatinine, Ser: 1.39 mg/dL — ABNORMAL HIGH (ref 0.44–1.00)
GFR, Estimated: 36 mL/min — ABNORMAL LOW (ref >60)
Glucose, Bld: 105 mg/dL — ABNORMAL HIGH (ref 70–99)
Potassium: 4 mmol/L (ref 3.5–5.1)
Sodium: 136 mmol/L (ref 135–145)
Total Bilirubin: 0.5 mg/dL (ref 0.3–1.2)
Total Protein: 6.8 g/dL (ref 6.5–8.1)

## 2020-04-07 LAB — URINALYSIS, ROUTINE W REFLEX MICROSCOPIC
Bilirubin Urine: NEGATIVE
Glucose, UA: NEGATIVE mg/dL
Hgb urine dipstick: NEGATIVE
Ketones, ur: NEGATIVE mg/dL
Nitrite: NEGATIVE
Protein, ur: NEGATIVE mg/dL
Specific Gravity, Urine: 1.009 (ref 1.005–1.030)
pH: 6 (ref 5.0–8.0)

## 2020-04-07 LAB — CBC WITH DIFFERENTIAL/PLATELET
Abs Immature Granulocytes: 0.04 10*3/uL (ref 0.00–0.07)
Basophils Absolute: 0 10*3/uL (ref 0.0–0.1)
Basophils Relative: 0 %
Eosinophils Absolute: 0.2 10*3/uL (ref 0.0–0.5)
Eosinophils Relative: 3 %
HCT: 39.6 % (ref 36.0–46.0)
Hemoglobin: 12.9 g/dL (ref 12.0–15.0)
Immature Granulocytes: 1 %
Lymphocytes Relative: 9 %
Lymphs Abs: 0.6 10*3/uL — ABNORMAL LOW (ref 0.7–4.0)
MCH: 30.8 pg (ref 26.0–34.0)
MCHC: 32.6 g/dL (ref 30.0–36.0)
MCV: 94.5 fL (ref 80.0–100.0)
Monocytes Absolute: 0.5 10*3/uL (ref 0.1–1.0)
Monocytes Relative: 8 %
Neutro Abs: 5.4 10*3/uL (ref 1.7–7.7)
Neutrophils Relative %: 79 %
Platelets: 219 10*3/uL (ref 150–400)
RBC: 4.19 MIL/uL (ref 3.87–5.11)
RDW: 14.6 % (ref 11.5–15.5)
WBC: 6.8 10*3/uL (ref 4.0–10.5)
nRBC: 0 % (ref 0.0–0.2)

## 2020-04-07 LAB — PROTIME-INR
INR: 1.2 (ref 0.8–1.2)
Prothrombin Time: 14.7 seconds (ref 11.4–15.2)

## 2020-04-07 LAB — APTT: aPTT: 30 seconds (ref 24–36)

## 2020-04-07 LAB — PROCALCITONIN: Procalcitonin: <0.1 ng/mL

## 2020-04-07 LAB — LACTIC ACID, PLASMA: Lactic Acid, Venous: 1.6 mmol/L (ref 0.5–1.9)

## 2020-04-07 MED ORDER — TRAZODONE HCL 50 MG PO TABS: 50.0000 mg | ORAL_TABLET | Freq: Every evening | ORAL | Status: DC | PRN

## 2020-04-07 MED ORDER — FAMOTIDINE 20 MG PO TABS
20.0000 mg | ORAL_TABLET | Freq: Every day | ORAL | Status: DC
Start: 2020-04-08 — End: 2020-04-11
  Administered 2020-04-08 – 2020-04-11 (×4): 20 mg via ORAL
  Filled 2020-04-07 (×4): qty 1

## 2020-04-07 MED ORDER — APIXABAN 2.5 MG PO TABS
2.5000 mg | ORAL_TABLET | Freq: Two times a day (BID) | ORAL | Status: DC
  Administered 2020-04-07 – 2020-04-11 (×8): 2.5 mg via ORAL
  Filled 2020-04-07 (×8): qty 1

## 2020-04-07 MED ORDER — POLYVINYL ALCOHOL 1.4 % OP SOLN
1.0000 [drp] | Freq: Four times a day (QID) | OPHTHALMIC | Status: DC | PRN
  Filled 2020-04-07: qty 15

## 2020-04-07 MED ORDER — SODIUM CHLORIDE 0.9% FLUSH
3.0000 mL | Freq: Two times a day (BID) | INTRAVENOUS | Status: DC
  Administered 2020-04-08 – 2020-04-11 (×4): 3 mL via INTRAVENOUS

## 2020-04-07 MED ORDER — QUETIAPINE FUMARATE 25 MG PO TABS
75.0000 mg | ORAL_TABLET | Freq: Every day | ORAL | Status: DC
  Administered 2020-04-07 – 2020-04-10 (×4): 75 mg via ORAL
  Filled 2020-04-07 (×4): qty 3

## 2020-04-07 MED ORDER — ACETAMINOPHEN 325 MG PO TABS: 650.0000 mg | ORAL_TABLET | Freq: Four times a day (QID) | ORAL | Status: DC | PRN

## 2020-04-07 MED ORDER — QUETIAPINE FUMARATE 100 MG PO TABS: 100.0000 mg | ORAL_TABLET | Freq: Every day | ORAL | Status: DC

## 2020-04-07 MED ORDER — PANTOPRAZOLE SODIUM 40 MG PO TBEC
40.0000 mg | DELAYED_RELEASE_TABLET | Freq: Every day | ORAL | Status: DC
  Administered 2020-04-08 – 2020-04-11 (×4): 40 mg via ORAL
  Filled 2020-04-07 (×4): qty 1

## 2020-04-07 MED ORDER — ASPIRIN 81 MG PO CHEW
81.0000 mg | CHEWABLE_TABLET | Freq: Every day | ORAL | Status: DC
  Administered 2020-04-08 – 2020-04-11 (×4): 81 mg via ORAL
  Filled 2020-04-07 (×4): qty 1

## 2020-04-07 MED ORDER — DULOXETINE HCL 60 MG PO CPEP
60.0000 mg | ORAL_CAPSULE | Freq: Every day | ORAL | Status: DC
  Administered 2020-04-08 – 2020-04-11 (×4): 60 mg via ORAL
  Filled 2020-04-07 (×4): qty 1

## 2020-04-07 MED ORDER — ALPRAZOLAM 0.25 MG PO TABS: 0.2500 mg | ORAL_TABLET | Freq: Every day | ORAL | Status: DC | PRN

## 2020-04-07 MED ORDER — SODIUM CHLORIDE 0.9 % IV BOLUS
500.0000 mL | Freq: Once | INTRAVENOUS | Status: AC
  Administered 2020-04-07: 500 mL via INTRAVENOUS

## 2020-04-07 MED ORDER — LACTATED RINGERS IV BOLUS (SEPSIS)
1000.0000 mL | Freq: Once | INTRAVENOUS | Status: AC
  Administered 2020-04-07: 1000 mL via INTRAVENOUS

## 2020-04-07 MED ORDER — LACTATED RINGERS IV SOLN: INTRAVENOUS | Status: DC

## 2020-04-07 MED ORDER — SODIUM CHLORIDE 0.9 % IV SOLN
1.0000 g | INTRAVENOUS | Status: AC
  Administered 2020-04-07 – 2020-04-10 (×4): 1 g via INTRAVENOUS
  Filled 2020-04-07 (×4): qty 10

## 2020-04-07 MED ORDER — ONDANSETRON HCL 4 MG PO TABS: 4.0000 mg | ORAL_TABLET | Freq: Four times a day (QID) | ORAL | Status: DC | PRN

## 2020-04-07 MED ORDER — METOPROLOL TARTRATE 25 MG PO TABS
25.0000 mg | ORAL_TABLET | Freq: Two times a day (BID) | ORAL | Status: DC
  Administered 2020-04-07 – 2020-04-08 (×3): 25 mg via ORAL
  Filled 2020-04-07 (×3): qty 1

## 2020-04-07 MED ORDER — LORATADINE 10 MG PO TABS
10.0000 mg | ORAL_TABLET | Freq: Every day | ORAL | Status: DC
Start: 2020-04-08 — End: 2020-04-11
  Administered 2020-04-08 – 2020-04-11 (×4): 10 mg via ORAL
  Filled 2020-04-07 (×4): qty 1

## 2020-04-07 MED ORDER — LACTATED RINGERS IV SOLN: INTRAVENOUS | Status: AC

## 2020-04-07 MED ORDER — PANCRELIPASE (LIP-PROT-AMYL) 36000-114000 UNITS PO CPEP
72000.0000 [IU] | ORAL_CAPSULE | Freq: Three times a day (TID) | ORAL | Status: DC
  Administered 2020-04-08 – 2020-04-11 (×9): 72000 [IU] via ORAL
  Filled 2020-04-07 (×13): qty 2

## 2020-04-07 MED ORDER — PREGABALIN 75 MG PO CAPS
75.0000 mg | ORAL_CAPSULE | Freq: Three times a day (TID) | ORAL | Status: DC
Start: 2020-04-07 — End: 2020-04-11
  Administered 2020-04-07 – 2020-04-11 (×11): 75 mg via ORAL
  Filled 2020-04-07 (×11): qty 1

## 2020-04-07 MED ORDER — ONDANSETRON HCL 4 MG/2ML IJ SOLN: 4.0000 mg | Freq: Four times a day (QID) | INTRAMUSCULAR | Status: DC | PRN

## 2020-04-07 MED ORDER — ACETAMINOPHEN 650 MG RE SUPP: 650.0000 mg | Freq: Four times a day (QID) | RECTAL | Status: DC | PRN

## 2020-04-07 NOTE — Progress Notes (Signed)
Loss of IV access due to pt removing IV. New IV placed, 500cc bolus started. Night shift RN aware.

## 2020-04-07 NOTE — Sepsis Progress Note (Signed)
[facility] is monitoring the sepsis protocol. 

## 2020-04-07 NOTE — Sepsis Progress Note (Signed)
Notified provider of need to order additional fluid bolus to meet 2517 ml needed for protocol. She has received 2 liters per MAR.

## 2020-04-07 NOTE — H&P (Signed)
History and Physical    Deborah Chung VCB:[id number] DOB: 1926-06-20 DOA: 04/07/2020  PCP: Etta Grandchild, MD Consultants:  [facility] - cardiology; Christella Hartigan - GI Patient coming from: [facility]; NOK: Son, [phone]  Chief Complaint:  Confusion  HPI: Deborah Chung is a 87 y.o. female with medical history significant of chronic systolic CHF; pacemaker placement; HTN; HLD; anxiety/depression; and afib presenting with weakness, confusion, and visual hallucinations.  She was diagnosed with a UTI and started on antibiotics yesterday.  She is prone to falls and comes periodically for that reason.  Today, she was unable to assist in getting out of bed.  She is always groggy and confused but apparently moreso than usual.  They sent her in for this reason.  She was diagnosed with a UTI 2 nights ago because she was acting confused.  No obvious urinary symptoms.  Baseline mental status with hallucinations, not usually agitated and it comes in and out of regular conversation.    Has limited short-term memory.  She has had progressive dementia, has not discussed memory care.      ED Course:  AMS, likely due to UTI.  Diagnosed with UTI 2 days ago and started on Bactrim.  UA looks ok - likely due to >48 hours of antibiotics.  Head CT unremarkable.  Hypotension and tachycardia.  Called code sepsis.  Review of Systems: As per HPI; otherwise review of systems reviewed and negative.   Ambulatory Status: Barely ambulatory - considering need for SNF  COVID Vaccine Status:  Complete  Past Medical History:  Diagnosis Date  . Arthritis   . Atrial fibrillation (HCC)   . Blood transfusion without reported diagnosis   . Depression   . GAD (generalized anxiety disorder)   . Hearing loss   . Hyperlipidemia   . Hypertension   . Insomnia   . Osteoarthritis   . Pacemaker   . PHN (postherpetic neuralgia) 2010  . Systolic CHF [facility])     Past Surgical History:  Procedure Laterality Date  . ABDOMINAL  HYSTERECTOMY    . PACEMAKER PLACEMENT    . TOTAL KNEE ARTHROPLASTY Bilateral     Social History   Socioeconomic History  . Marital status: Widowed    Spouse name: Not on file  . Number of children: 3  . Years of education: Not on file  . Highest education level: Not on file  Occupational History  . Occupation: retired  Tobacco Use  . Smoking status: Never Smoker  . Smokeless tobacco: Never Used  Vaping Use  . Vaping Use: Never used  Substance and Sexual Activity  . Alcohol use: Not Currently  . Drug use: Never  . Sexual activity: Never  Other Topics Concern  . Not on file  Social History Narrative   ** Merged History Encounter **       ** Merged History Encounter **       Social Determinants of Health   Financial Resource Strain: Not on file  Food Insecurity: Not on file  Transportation Needs: Not on file  Physical Activity: Not on file  Stress: Not on file  Social Connections: Not on file  Intimate Partner Violence: Not on file    No Known Allergies  Family History  Problem Relation Age of Onset  . Heart disease Father   . Heart disease Sister     Prior to Admission medications   Medication Sig Start Date End Date Taking? Authorizing Provider  acetaminophen (TYLENOL) 325 MG tablet Take 650 mg  by mouth 2 (two) times daily as needed (pain).    [provider]  ALPRAZolam (XANAX) 0.25 MG tablet TAKE 1 TABLET BY MOUTH AS NEEDED FOR ANXIETY Patient taking differently: Take 0.25 mg by mouth daily as needed for anxiety. 05/13/19   Etta Grandchild, MD  apixaban (ELIQUIS) 2.5 MG TABS tablet Take 2.5 mg by mouth 2 (two) times daily.    [provider]  aspirin 81 MG chewable tablet Chew 81 mg by mouth daily.    [provider]  Biotin 03500 MCG TABS Take 10,000 mcg by mouth daily.    [provider]  calcium carbonate (TUMS) 500 MG chewable tablet Chew 1 tablet (200 mg of elemental calcium total) by mouth 2 (two) times daily as  needed for indigestion or heartburn. 06/03/19   Etta Grandchild, MD  cephALEXin (KEFLEX) 500 MG capsule Take 1 capsule (500 mg total) by mouth 4 (four) times daily. 01/08/20   Gwyneth Sprout, MD  diphenoxylate-atropine (LOMOTIL) 2.5-0.025 MG tablet Take 1 tablet by mouth every 6 (six) hours as needed for diarrhea or loose stools.    [provider]  DULoxetine (CYMBALTA) 60 MG capsule Take 1 capsule (60 mg total) by mouth daily. 01/27/18   Etta Grandchild, MD  esomeprazole (NEXIUM) 20 MG capsule Take 1 capsule (20 mg total) by mouth daily at 12 noon. 07/16/19   Etta Grandchild, MD  famotidine (PEPCID) 20 MG tablet Take 20 mg by mouth daily at 6 (six) AM. 04/17/19   [provider]  guaiFENesin (ROBITUSSIN) 100 MG/5ML liquid Take 200 mg by mouth every 4 (four) hours as needed for cough.    [provider]  lipase/protease/amylase (CREON) 36000 UNITS CPEP capsule Take 2 capsules with each meal and 1 capsule with each snack Patient taking differently: Take 72,000 Units by mouth 3 (three) times daily before meals. 11/03/18   Rachael Fee, MD  loratadine (CLARITIN) 10 MG tablet Take 10 mg by mouth daily.    [provider]  metoprolol tartrate (LOPRESSOR) 25 MG tablet TAKE 1 TABLET BY MOUTH TWICE A DAY Patient taking differently: Take 25 mg by mouth 2 (two) times daily. (BETA BLOCKER) 01/13/18   Etta Grandchild, MD  neomycin-bacitracin-polymyxin (NEOSPORIN) ointment Apply 1 application topically See admin instructions. Ordered 12/13/2019: Cleanse skin tear on left hand close to the wrist with soap and water. Pat to dry. Apply thin layer of triple antibiotic oint, cover with bandaid or non-adhesive dressing once daily until healed, then discontinue.    [provider]  ondansetron (ZOFRAN) 4 MG tablet Take 4 mg by mouth every 4 (four) hours as needed for nausea or vomiting.    [provider]  polyvinyl alcohol (LIQUIFILM TEARS) 1.4 % ophthalmic solution  Place 1 drop into both eyes 4 (four) times daily as needed for dry eyes.    [provider]  pregabalin (LYRICA) 75 MG capsule Take 1 capsule (75 mg total) by mouth 3 (three) times daily. 10/22/19   Etta Grandchild, MD  Probiotic Product (PROBIOTIC PO) Take 1 capsule by mouth daily.    [provider]  QUEtiapine (SEROQUEL) 100 MG tablet Take 1 tablet (100 mg total) by mouth at bedtime. 12/29/19   Etta Grandchild, MD  spironolactone (ALDACTONE) 25 MG tablet TAKE 1 TABLET BY MOUTH EVERY DAY Patient taking differently: Take 25 mg by mouth daily. 06/12/18   Etta Grandchild, MD  torsemide (DEMADEX) 10 MG tablet Take 10 mg  by mouth daily.    [provider]  traZODone (DESYREL) 50 MG tablet TAKE 1 TABLET (50 MG TOTAL) BY MOUTH AT BEDTIME AS NEEDED FOR SLEEP. Patient taking differently: Take 50 mg by mouth at bedtime as needed for sleep. 05/03/18   Etta Grandchild, MD    Physical Exam: Vitals:   04/07/20 1330 04/07/20 1340 04/07/20 1430 04/07/20 1526  BP: 99/79 92/62 (!) 122/99 (!) 111/53  Pulse: 74 (!) 118 (!) 125 (!) 108  Resp: 19 19 17 20   Temp:    97.9 F (36.6 C)  TempSrc:    Oral  SpO2: 93% 99% 96% 90%     . General:  Appears calm and comfortable and is in NAD; she is talking to imaginary babies and puppies periodically throughout evaluation . Eyes:  PERRL, EOMI, normal lids, iris . ENT:  grossly normal hearing, lips & tongue, mmm . Neck:  no LAD, masses or thyromegaly . Cardiovascular:  RRR, no m/r/g. Chronic 2-3+ LE edema with compression stockings in place.  Respiratory:   CTA bilaterally with no wheezes/rales/rhonchi.  Normal respiratory effort. . Abdomen:  soft, mildly diffusely TTP, ND, NABS . Skin:  no rash or induration seen on limited exam . Musculoskeletal:  Mildly decreased tone BUE/BLE, good ROM, no bony abnormality . Psychiatric:  grossly normal mood and affect, speech fluent and appropriate but also with apparent psychosis, AOx1 . Neurologic:   CN 2-12 grossly intact    Radiological Exams on Admission: Independently reviewed - see discussion in A/P where applicable  CT Head Wo Contrast  Result Date: 04/07/2020 CLINICAL DATA:  Delirium.  Increased weakness and confusion EXAM: CT HEAD WITHOUT CONTRAST TECHNIQUE: Contiguous axial images were obtained from the base of the skull through the vertex without intravenous contrast. COMPARISON:  03/16/2020 FINDINGS: Brain: The heart size is normal. No substantial pericardial effusion. Diffuse loss of parenchymal volume is consistent with atrophy. Patchy low attenuation in the deep hemispheric and periventricular white matter is nonspecific, but likely reflects chronic microvascular ischemic demyelination. Vascular: No hyperdense vessel or unexpected calcification. Skull: No evidence for fracture. No worrisome lytic or sclerotic lesion. Sinuses/Orbits: The visualized paranasal sinuses and mastoid air cells are clear. Visualized portions of the globes and intraorbital fat are unremarkable. Other: None. IMPRESSION: 1. No acute intracranial abnormality. 2. Atrophy with chronic small vessel white matter ischemic disease. Electronically Signed   By: 05/16/2020 M.D.   On: 04/07/2020 13:27   DG Chest Port 1 View  Result Date: 04/07/2020 CLINICAL DATA:  Possible sepsis. EXAM: PORTABLE CHEST 1 VIEW COMPARISON:  03/16/2020 FINDINGS: Heart size upper limits of normal. Chronic aortic atherosclerosis. Dual lead pacemaker with leads in the region of the right atrium and right ventricle. Pulmonary vascularity is normal. The lungs are clear. No infiltrate, collapse or effusion. No acute bone finding. IMPRESSION: No active disease. Pacemaker. Aortic atherosclerosis. Electronically Signed   By: 05/16/2020 M.D.   On: 04/07/2020 12:06    EKG: Independently reviewed.  NSR with rate 70; no evidence of acute ischemia   Labs on Admission: I have personally reviewed the available labs and imaging studies at the time of  the admission.  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 -Sepsis protocol initiated in ER -Suspected source is UTI - she was diagnosed with UTI at her facility 2 days ago and started on Bactrim -Blood and urine cultures pending -Will place in observation status with telemetry and continue to monitor -Treat with IV Rocephin for possible UTI -Will order procalcitonin level.  Antibiotics would not be indicated for PCT <0.1 and probably should not be used for < 0.25.  >0.5 indicates infection and >>0.5 indicates more serious disease.  As the procalcitonin level normalizes, it will be reasonable to consider de-escalation of antibiotic coverage.  Dementia with psychosis -She is reported to have had AMS upon presentation, but according to her son she is altered at baseline with chronic cognitive impairment -Part of the issue is that she appears to no longer be appropriate for ALF since she is mostly bedbound and has progressive dementia -Will request a sitter - she was having significant hallucinations and was getting agitated at times in the ER -Continue Cymbalta, Seroquel, Trazodone, Xanax  Chronic diastolic CHF -Most recent echo was in 11/2019 and should preserved EF and grade 1 diastolic dysfunction -Hold Demadex and Aldactone for now but resume as soon as appropriate  HTN -Continue Lopressor  HLD -She does not appear to be taking medications for this issue at  this time  Afib -Has pacemaker -Was in RVR for some time but this has normalized with IVF and agitation control -Continue Eliquis  Stage 3b CKD -Appears to be stable -Will follow    Note: This patient has been tested and is negative for the novel coronavirus COVID-19. She has been fully vaccinated against COVID-19.    DVT prophylaxis:  Eliquis Code Status:  DNR - confirmed with patient/family Family Communication: Son present throughout evaluation  Disposition Plan:  The patient is from: ALF  Anticipated d/c is to: ALF vs. SNF.  Anticipated d/c date will depend on clinical response to treatment, but possibly as early as tomorrow if she has excellent response to treatment  Patient is currently: acutely ill Consults called: PT/OT/TOC team Admission status: It is my clinical opinion that referral for OBSERVATION is reasonable and necessary in this patient based on the above information provided. The aforementioned taken together are felt to place the patient at high risk for further clinical deterioration. However it is anticipated that the patient may be medically stable for discharge from the hospital within 24 to 48 hours.     Jonah Blue MD [facility]   How to contact the [facility] Attending or Consulting provider 7A - 7P or covering provider during after hours 7P -7A, for this patient?  1. Check the care team in [facility] and look for a) attending/consulting TR[facility] provider listed and b) the [facility] team listed 2. Log into [email or web address] and use [facility] universal password to access. If you do not have the password, please contact the hospital operator. 3. Locate the [facility] provider you are looking for under [facility] and page to a number that you can be directly reached. 4. If you still have difficulty reaching the provider, please page the [facility] (Director on Call) for the Hospitalists listed on amion for assistance.   04/07/2020, 6:44 PM

## 2020-04-07 NOTE — ED Notes (Signed)
Report called to [place] of [phone]

## 2020-04-07 NOTE — ED Notes (Signed)
Culture collected with UA

## 2020-04-07 NOTE — ED Notes (Signed)
Attempted to place IV not in the [facility] due to patient bending her arm for IV fluids. Attempted two times. Notified RN of failed IV attempts.

## 2020-04-07 NOTE — ED Notes (Signed)
Pt having increased anxiety at this time and is seeing objects and people in the room that aren't there. Redirected patient and attempted to reorient patient. Family at bedside requesting patient to receive something for anxiety.

## 2020-04-07 NOTE — ED Provider Notes (Signed)
[facility] EMERGENCY DEPARTMENT Provider Note   CSN: [id number] Arrival date & time: 04/07/20  1110     History Chief Complaint  Patient presents with  . Weakness  . Fever    Deborah Chung is a 87 y.o. female with friend past medical history of A. fib, depression, hypertension, systolic CHF with pacemaker, stage III kidney disease that presents the emerge department today for increased weakness and confusion after being diagnosed with UTtwo days ago.  Patient presents from [facility] [employer] [facility] [employer].  States that this started today.  Patient is alert and oriented x3, however she states that she sees people in the room that are not there.  Patient is level 5 caveat due to altered mental status.  Was able to speak to son, he states that she was placed on Bactrim 2 days ago.  He also recognizes new onset of confusion.  HPI     Past Medical History:  Diagnosis Date  . Arthritis   . Atrial fibrillation (HCC)   . Blood transfusion without reported diagnosis   . Depression   . GAD (generalized anxiety disorder)   . Hearing loss   . Hyperlipidemia   . Hypertension   . Insomnia   . Osteoarthritis   . Pacemaker   . PHN (postherpetic neuralgia) 2010  . Systolic CHF [facility])     Patient Active Problem List   Diagnosis Date Noted  . Sepsis secondary to UTI (HCC) 04/07/2020  . Tinea corporis 04/21/2019  . 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 Stage 3b chronic kidney disease (HCC) 04/21/2019  . Complete heart block (HCC) 01/28/2019  . Rotator cuff tear arthropathy 01/28/2018  . Primary osteoarthritis involving multiple joints 01/27/2018  . Psychophysiological insomnia 06/24/2017  . Bacterial overgrowth syndrome 08/31/2015  . Non-ischemic cardiomyopathy (HCC) 08/21/2015  . Hyperlipidemia with target LDL less than 100 05/30/2015  . Allergic rhinitis due to pollen 05/30/2015  . Gastroesophageal reflux disease without esophagitis 05/30/2015  .  Hyperglycemia 05/30/2015  . Routine general medical examination at a health care facility 05/30/2015  . Pacemaker 08/03/2013  . Atrial fibrillation (HCC) 06/29/2013  . Essential hypertension, benign 06/29/2013  . Hypertriglyceridemia 06/29/2013  . Depression with anxiety 06/29/2013  . PHN (postherpetic neuralgia) 06/29/2013    Past Surgical History:  Procedure Laterality Date  . ABDOMINAL HYSTERECTOMY    . PACEMAKER PLACEMENT    . TOTAL KNEE ARTHROPLASTY Bilateral      OB History    Gravida  3   Para  3   Term  3   Preterm  0   AB  0   Living  3     SAB  0   IAB  0   Ectopic  0   Multiple      Live Births  3           Family History  Problem Relation Age of Onset  . Heart disease Father   . Heart disease Sister     Social History   Tobacco Use  . Smoking status: Never Smoker  . Smokeless tobacco: Never Used  Vaping Use  . Vaping Use: Never used  Substance Use Topics  . Alcohol use: Not Currently  . Drug use: Never    Home Medications Prior to Admission medications   Medication Sig Start Date End Date Taking? Authorizing Provider  acetaminophen (TYLENOL) 325 MG tablet Take 650 mg by mouth 2 (two) times daily as needed (pain).    [provider]  ALPRAZolam (XANAX) 0.25 MG tablet TAKE 1 TABLET BY MOUTH AS NEEDED FOR ANXIETY Patient taking differently: Take 0.25 mg by mouth daily as needed for anxiety. 05/13/19   Etta Grandchild, MD  apixaban (ELIQUIS) 2.5 MG TABS tablet Take 2.5 mg by mouth 2 (two) times daily.    [provider]  aspirin 81 MG chewable tablet Chew 81 mg by mouth daily.    [provider]  Biotin 01027 MCG TABS Take 10,000 mcg by mouth daily.    [provider]  calcium carbonate (TUMS) 500 MG chewable tablet Chew 1 tablet (200 mg of elemental calcium total) by mouth 2 (two) times daily as needed for indigestion or heartburn. 06/03/19   Etta Grandchild, MD  cephALEXin (KEFLEX) 500 MG capsule  Take 1 capsule (500 mg total) by mouth 4 (four) times daily. 01/08/20   Gwyneth Sprout, MD  diphenoxylate-atropine (LOMOTIL) 2.5-0.025 MG tablet Take 1 tablet by mouth every 6 (six) hours as needed for diarrhea or loose stools.    [provider]  DULoxetine (CYMBALTA) 60 MG capsule Take 1 capsule (60 mg total) by mouth daily. 01/27/18   Etta Grandchild, MD  esomeprazole (NEXIUM) 20 MG capsule Take 1 capsule (20 mg total) by mouth daily at 12 noon. 07/16/19   Etta Grandchild, MD  famotidine (PEPCID) 20 MG tablet Take 20 mg by mouth daily at 6 (six) AM. 04/17/19   [provider]  guaiFENesin (ROBITUSSIN) 100 MG/5ML liquid Take 200 mg by mouth every 4 (four) hours as needed for cough.    [provider]  lipase/protease/amylase (CREON) 36000 UNITS CPEP capsule Take 2 capsules with each meal and 1 capsule with each snack Patient taking differently: Take 72,000 Units by mouth 3 (three) times daily before meals. 11/03/18   Rachael Fee, MD  loratadine (CLARITIN) 10 MG tablet Take 10 mg by mouth daily.    [provider]  metoprolol tartrate (LOPRESSOR) 25 MG tablet TAKE 1 TABLET BY MOUTH TWICE A DAY Patient taking differently: Take 25 mg by mouth 2 (two) times daily. (BETA BLOCKER) 01/13/18   Etta Grandchild, MD  neomycin-bacitracin-polymyxin (NEOSPORIN) ointment Apply 1 application topically See admin instructions. Ordered 12/13/2019: Cleanse skin tear on left hand close to the wrist with soap and water. Pat to dry. Apply thin layer of triple antibiotic oint, cover with bandaid or non-adhesive dressing once daily until healed, then discontinue.    [provider]  ondansetron (ZOFRAN) 4 MG tablet Take 4 mg by mouth every 4 (four) hours as needed for nausea or vomiting.    [provider]  polyvinyl alcohol (LIQUIFILM TEARS) 1.4 % ophthalmic solution Place 1 drop into both eyes 4 (four) times daily as needed for dry eyes.    [provider]   pregabalin (LYRICA) 75 MG capsule Take 1 capsule (75 mg total) by mouth 3 (three) times daily. 10/22/19   Etta Grandchild, MD  Probiotic Product (PROBIOTIC PO) Take 1 capsule by mouth daily.    [provider]  QUEtiapine (SEROQUEL) 100 MG tablet Take 1 tablet (100 mg total) by mouth at bedtime. 12/29/19   Etta Grandchild, MD  spironolactone (ALDACTONE) 25 MG tablet TAKE 1 TABLET BY MOUTH EVERY DAY Patient taking differently: Take 25 mg by mouth daily. 06/12/18   Etta Grandchild, MD  torsemide (DEMADEX) 10 MG tablet Take 10 mg by mouth daily.    [provider]  traZODone (DESYREL) 50  MG tablet TAKE 1 TABLET (50 MG TOTAL) BY MOUTH AT BEDTIME AS NEEDED FOR SLEEP. Patient taking differently: Take 50 mg by mouth at bedtime as needed for sleep. 05/03/18   Etta Grandchild, MD    Allergies    Patient has no known allergies.  Review of Systems   Review of Systems  Unable to perform ROS: Mental status change    Physical Exam Updated Vital Signs BP (!) 122/99   Pulse (!) 125   Temp (!) 101.7 F (38.7 C) (Rectal)   Resp 17   SpO2 96%   Physical Exam Constitutional:      General: She is not in acute distress.    Appearance: Normal appearance. She is not ill-appearing, toxic-appearing or diaphoretic.  HENT:     Mouth/Throat:     Mouth: Mucous membranes are moist.     Pharynx: Oropharynx is clear.  Eyes:     General: No scleral icterus.    Extraocular Movements: Extraocular movements intact.     Pupils: Pupils are equal, round, and reactive to light.  Cardiovascular:     Rate and Rhythm: Normal rate and regular rhythm.     Pulses: Normal pulses.     Heart sounds: Normal heart sounds.  Pulmonary:     Effort: Pulmonary effort is normal. No respiratory distress.     Breath sounds: Normal breath sounds. No stridor. No wheezing, rhonchi or rales.  Chest:     Chest wall: No tenderness.  Abdominal:     General: Abdomen is flat. There is no distension.     Palpations:  Abdomen is soft.     Tenderness: There is no abdominal tenderness. There is no guarding or rebound.  Musculoskeletal:        General: No swelling or tenderness. Normal range of motion.     Cervical back: Normal range of motion and neck supple. No rigidity.     Right lower leg: No edema.     Left lower leg: No edema.  Skin:    General: Skin is warm and dry.     Capillary Refill: Capillary refill takes less than 2 seconds.     Coloration: Skin is not pale.  Neurological:     General: No focal deficit present.     Mental Status: She is alert and oriented to person, place, and time.  Psychiatric:        Mood and Affect: Mood normal.        Behavior: Behavior normal.        Thought Content: Thought content normal.     ED Results / Procedures / Treatments   Labs (all labs ordered are listed, but only abnormal results are displayed) Labs Reviewed  COMPREHENSIVE METABOLIC PANEL - Abnormal; Notable for the following components:      Result Value   Glucose, Bld 105 (*)    Creatinine, Ser 1.39 (*)    Alkaline Phosphatase 140 (*)    GFR, Estimated 36 (*)    All other components within normal limits  CBC WITH DIFFERENTIAL/PLATELET - Abnormal; Notable for the following components:   Lymphs Abs 0.6 (*)    All other components within normal limits  URINALYSIS, ROUTINE W REFLEX MICROSCOPIC - Abnormal; Notable for the following components:   Color, Urine STRAW (*)    Leukocytes,Ua SMALL (*)    Bacteria, UA RARE (*)    All other components within normal limits  CULTURE, BLOOD (ROUTINE X 2)  CULTURE, BLOOD (ROUTINE X 2)  RESP PANEL BY RT-PCR (FLU A&B, COVID) ARPGX2  URINE CULTURE  LACTIC ACID, PLASMA  PROTIME-INR  APTT  LACTIC ACID, PLASMA    EKG None  Radiology CT Head Wo Contrast  Result Date: 04/07/2020 CLINICAL DATA:  Delirium.  Increased weakness and confusion EXAM: CT HEAD WITHOUT CONTRAST TECHNIQUE: Contiguous axial images were obtained from the base of the skull through  the vertex without intravenous contrast. COMPARISON:  03/16/2020 FINDINGS: Brain: The heart size is normal. No substantial pericardial effusion. Diffuse loss of parenchymal volume is consistent with atrophy. Patchy low attenuation in the deep hemispheric and periventricular white matter is nonspecific, but likely reflects chronic microvascular ischemic demyelination. Vascular: No hyperdense vessel or unexpected calcification. Skull: No evidence for fracture. No worrisome lytic or sclerotic lesion. Sinuses/Orbits: The visualized paranasal sinuses and mastoid air cells are clear. Visualized portions of the globes and intraorbital fat are unremarkable. Other: None. IMPRESSION: 1. No acute intracranial abnormality. 2. Atrophy with chronic small vessel white matter ischemic disease. Electronically Signed   By: Kennith Center M.D.   On: 04/07/2020 13:27   DG Chest Port 1 View  Result Date: 04/07/2020 CLINICAL DATA:  Possible sepsis. EXAM: PORTABLE CHEST 1 VIEW COMPARISON:  03/16/2020 FINDINGS: Heart size upper limits of normal. Chronic aortic atherosclerosis. Dual lead pacemaker with leads in the region of the right atrium and right ventricle. Pulmonary vascularity is normal. The lungs are clear. No infiltrate, collapse or effusion. No acute bone finding. IMPRESSION: No active disease. Pacemaker. Aortic atherosclerosis. Electronically Signed   By: Paulina Fusi M.D.   On: 04/07/2020 12:06    Procedures .Critical Care Performed by: Farrel Gordon, PA-C Authorized by: Farrel Gordon, PA-C   Critical care provider statement:    Critical care time (minutes):  45   Critical care was time spent personally by me on the following activities:  Discussions with consultants, evaluation of patient's response to treatment, examination of patient, ordering and performing treatments and interventions, ordering and review of laboratory studies, ordering and review of radiographic studies, pulse oximetry, re-evaluation of  patient's condition, obtaining history from patient or surrogate and review of old charts     Medications Ordered in ED Medications  lactated ringers infusion (has no administration in time range)  cefTRIAXone (ROCEPHIN) 1 g in sodium chloride 0.9 % 100 mL IVPB (0 g Intravenous Stopped 04/07/20 1211)  lactated ringers bolus 1,000 mL (1,000 mLs Intravenous New Bag/Given 04/07/20 1141)    And  lactated ringers bolus 1,000 mL (1,000 mLs Intravenous New Bag/Given 04/07/20 1141)    ED Course  I have reviewed the triage vital signs and the nursing notes.  Pertinent labs & imaging results that were available during my care of the patient were reviewed by me and considered in my medical decision making (see chart for details).    MDM Rules/Calculators/A&P                          87 year old female presenting to the emergency department today for altered mental status after being diagnosed with UTI yesterday.  Patient with respiratory rate of 20 with temperature of 101.7 blood pressure 95/70, technically meets SIRS criteria with known source.  Will call code sepsis at this time, blood cultures collected, 30 cc/kg fluid started ideal body weight, ceftriaxone started.  I think altered mental status is most likely coming from delirium from UTI.  Work-up today with no leukocytosis, normal lactic acid, urinalysis does show some small leukocytes with  rare bacteria and some white cells, however patient is on 36 hours of antibiotics.  Will obtain CT head as well.  Upon reevaluation CT head without any acute abnormality. Blood pressure improved after 2 L of fluid, , mentation has remained unchanged.  Was able to speak to son who agrees with medical admission due to increased confusion, continued tachycardia, temperature and slight hypotension with known diagnosis of UTI.  Patient admitted to Dr. Ophelia CharterYates.  The patient appears reasonably stabilized for admission considering the current resources, flow, and  capabilities available in the ED at this time, and [facility] screening and/or treatment in the ED prior to admission.  I discussed this case with my attending physician who cosigned this note including patient's presenting symptoms, physical exam, and planned diagnostics and interventions. Attending physician stated agreement with plan or made changes to plan which were implemented.   Final Clinical Impression(s) / ED Diagnoses Final diagnoses:  Delirium    Rx / DC Orders ED Discharge Orders    None       Farrel Gordonatel, Shalyn, PA-C 04/07/20 1526    Sabino DonovanKatz, Eric C, MD 04/10/20 1450

## 2020-04-07 NOTE — Progress Notes (Signed)
Pt arrived to the unit. Pt not in distress and tolerated well. 

## 2020-04-07 NOTE — ED Triage Notes (Signed)
Pt BIBA from [facility] with increased weakness and confusion. Dx with UTI yesterday and put on abx (unknown medication). Pt able to answer all questions appropriately, but is seeing people in the room that aren't there. Pt skin hot and dry. EMS CBG 135. VSS en route.

## 2020-04-07 NOTE — ED Notes (Signed)
Patient transported to CT 

## 2020-04-07 NOTE — Progress Notes (Signed)
Notified Dr. Ophelia Charter about pt's elevated HR. New orders for 500cc bolus.

## 2020-04-08 DIAGNOSIS — N390 Urinary tract infection, site not specified: Secondary | ICD-10-CM | POA: Diagnosis present

## 2020-04-08 DIAGNOSIS — Z90710 Acquired absence of both cervix and uterus: Secondary | ICD-10-CM | POA: Diagnosis not present

## 2020-04-08 DIAGNOSIS — Z7901 Long term (current) use of anticoagulants: Secondary | ICD-10-CM | POA: Diagnosis not present

## 2020-04-08 DIAGNOSIS — Z7982 Long term (current) use of aspirin: Secondary | ICD-10-CM | POA: Diagnosis not present

## 2020-04-08 DIAGNOSIS — F411 Generalized anxiety disorder: Secondary | ICD-10-CM | POA: Diagnosis present

## 2020-04-08 DIAGNOSIS — N3000 Acute cystitis without hematuria: Secondary | ICD-10-CM | POA: Diagnosis present

## 2020-04-08 DIAGNOSIS — Z20822 Contact with and (suspected) exposure to covid-19: Secondary | ICD-10-CM | POA: Diagnosis present

## 2020-04-08 DIAGNOSIS — E785 Hyperlipidemia, unspecified: Secondary | ICD-10-CM | POA: Diagnosis present

## 2020-04-08 DIAGNOSIS — R441 Visual hallucinations: Secondary | ICD-10-CM | POA: Diagnosis present

## 2020-04-08 DIAGNOSIS — I130 Hypertensive heart and chronic kidney disease with heart failure and stage 1 through stage 4 chronic kidney disease, or unspecified chronic kidney disease: Secondary | ICD-10-CM | POA: Diagnosis present

## 2020-04-08 DIAGNOSIS — Z79899 Other long term (current) drug therapy: Secondary | ICD-10-CM | POA: Diagnosis not present

## 2020-04-08 DIAGNOSIS — Z950 Presence of cardiac pacemaker: Secondary | ICD-10-CM | POA: Diagnosis not present

## 2020-04-08 DIAGNOSIS — Z66 Do not resuscitate: Secondary | ICD-10-CM | POA: Diagnosis present

## 2020-04-08 DIAGNOSIS — R410 Disorientation, unspecified: Secondary | ICD-10-CM | POA: Diagnosis not present

## 2020-04-08 DIAGNOSIS — Z8249 Family history of ischemic heart disease and other diseases of the circulatory system: Secondary | ICD-10-CM | POA: Diagnosis not present

## 2020-04-08 DIAGNOSIS — Z96653 Presence of artificial knee joint, bilateral: Secondary | ICD-10-CM | POA: Diagnosis present

## 2020-04-08 DIAGNOSIS — I959 Hypotension, unspecified: Secondary | ICD-10-CM | POA: Diagnosis present

## 2020-04-08 DIAGNOSIS — F32A Depression, unspecified: Secondary | ICD-10-CM | POA: Diagnosis present

## 2020-04-08 DIAGNOSIS — F0391 Unspecified dementia with behavioral disturbance: Secondary | ICD-10-CM | POA: Diagnosis present

## 2020-04-08 DIAGNOSIS — I5032 Chronic diastolic (congestive) heart failure: Secondary | ICD-10-CM | POA: Diagnosis present

## 2020-04-08 DIAGNOSIS — I4821 Permanent atrial fibrillation: Secondary | ICD-10-CM | POA: Diagnosis present

## 2020-04-08 DIAGNOSIS — N1832 Chronic kidney disease, stage 3b: Secondary | ICD-10-CM | POA: Diagnosis present

## 2020-04-08 DIAGNOSIS — Z9181 History of falling: Secondary | ICD-10-CM | POA: Diagnosis not present

## 2020-04-08 DIAGNOSIS — I4891 Unspecified atrial fibrillation: Secondary | ICD-10-CM | POA: Diagnosis not present

## 2020-04-08 LAB — BASIC METABOLIC PANEL
Anion gap: 6 (ref 5–15)
BUN: 14 mg/dL (ref 8–23)
CO2: 29 mmol/L (ref 22–32)
Calcium: 9 mg/dL (ref 8.9–10.3)
Chloride: 102 mmol/L (ref 98–111)
Creatinine, Ser: 1.25 mg/dL — ABNORMAL HIGH (ref 0.44–1.00)
GFR, Estimated: 41 mL/min — ABNORMAL LOW (ref >60)
Glucose, Bld: 115 mg/dL — ABNORMAL HIGH (ref 70–99)
Potassium: 4 mmol/L (ref 3.5–5.1)
Sodium: 137 mmol/L (ref 135–145)

## 2020-04-08 LAB — MRSA PCR SCREENING: MRSA by PCR: NEGATIVE

## 2020-04-08 LAB — CBC
HCT: 38.6 % (ref 36.0–46.0)
Hemoglobin: 12.9 g/dL (ref 12.0–15.0)
MCH: 30.9 pg (ref 26.0–34.0)
MCHC: 33.4 g/dL (ref 30.0–36.0)
MCV: 92.6 fL (ref 80.0–100.0)
Platelets: 192 10*3/uL (ref 150–400)
RBC: 4.17 MIL/uL (ref 3.87–5.11)
RDW: 14.8 % (ref 11.5–15.5)
WBC: 8.6 10*3/uL (ref 4.0–10.5)
nRBC: 0 % (ref 0.0–0.2)

## 2020-04-08 LAB — MAGNESIUM: Magnesium: 2.2 mg/dL (ref 1.7–2.4)

## 2020-04-08 LAB — RESP PANEL BY RT-PCR (FLU A&B, COVID) ARPGX2
Influenza A by PCR: NEGATIVE
Influenza B by PCR: NEGATIVE
SARS Coronavirus 2 by RT PCR: NEGATIVE

## 2020-04-08 LAB — URINE CULTURE: Culture: NO GROWTH

## 2020-04-08 MED ORDER — SODIUM CHLORIDE 0.9 % IV SOLN: INTRAVENOUS | Status: DC

## 2020-04-08 MED ORDER — METOPROLOL TARTRATE 5 MG/5ML IV SOLN
5.0000 mg | INTRAVENOUS | Status: AC | PRN
  Administered 2020-04-08 – 2020-04-09 (×2): 5 mg via INTRAVENOUS
  Filled 2020-04-08 (×2): qty 5

## 2020-04-08 MED ORDER — SODIUM CHLORIDE 0.9 % IV BOLUS
500.0000 mL | Freq: Once | INTRAVENOUS | Status: AC
  Administered 2020-04-08: 500 mL via INTRAVENOUS

## 2020-04-08 NOTE — Progress Notes (Signed)
Post bolus

## 2020-04-08 NOTE — Progress Notes (Addendum)
   04/08/20 0957  Vitals  Temp 99.3 F (37.4 C)  Temp Source Axillary  BP (!) 93/58  MAP (mmHg) 68  BP Location Left Arm  BP Method Automatic  Patient Position (if appropriate) Lying  Pulse Rate (!) 112  Resp (!) 23  Level of Consciousness  Level of Consciousness Alert  MEWS COLOR  MEWS Score Color Red  Oxygen Therapy  SpO2 93 %  O2 Device Room Air  MEWS Score  MEWS Temp 0  MEWS Systolic 1  MEWS Pulse 2  MEWS RR 1  MEWS LOC 0  MEWS Score 4  Provider Notification  Provider Name/Title Vann MD  Date Provider Notified 04/08/20  Time Provider Notified 1000  Notification Type Page  Notification Reason Change in status;Critical result (Red MEWS)  Test performed and critical result BP and HR  Date Critical Result Received 04/08/20  Time Critical Result Received 1000  Provider response See new orders  Date of Provider Response 04/08/20  Time of Provider Response 1011    MD made aware of runs of SVT on telemetry monitor. See new orders.

## 2020-04-08 NOTE — Progress Notes (Signed)
Progress Note    Deborah Chung  AST:[id number] DOB: 04-May-1926  DOA: 04/07/2020 PCP: Etta Grandchild, MD    Brief Narrative:     Medical records reviewed and are as summarized below:  Deborah Chung is an 87 y.o. female with medical history significant of chronic systolic CHF; pacemaker placement; HTN; HLD; anxiety/depression; and afib presenting with weakness, confusion, and visual hallucinations.  She was diagnosed with a UTI and started on antibiotics yesterday.  She is prone to falls and comes periodically for that reason.  Today, she was unable to assist in getting out of bed.  She is always groggy and confused but apparently moreso than usual.  They sent her in for this reason.  She was diagnosed with a UTI 2 nights ago because she was acting confused.  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 -Sepsis protocol initiated in ER -Suspected source is UTI - she was diagnosed with UTI at her facility 2 days ago and started on Bactrim -Blood and urine cultures pending -Will place in observation status with telemetry and continue to monitor -Treat with IV Rocephin for possible UTI -Will order procalcitonin level.  Antibiotics would not be indicated for PCT <0.1 and probably should not be used for < 0.25.  >0.5 indicates infection and >>0.5 indicates more serious disease.  As the procalcitonin level normalizes, it will be reasonable to consider de-escalation of antibiotic coverage.  Dementia with psychosis -She is reported to have had AMS upon  presentation, but according to her son she is altered at baseline with chronic cognitive impairment -Part of the issue is that she appears to no longer be appropriate for ALF since she is mostly bedbound and has progressive dementia -PRN -Continue Cymbalta, Seroquel, Trazodone, Xanax  Chronic diastolic CHF -Most recent echo was in 11/2019 and showed preserved EF and grade 1 diastolic dysfunction -Hold Demadex and Aldactone for now but resume as soon as appropriate  HTN -Continue Lopressor  HLD -She does not appear to be taking medications for this issue at this time  Afib -Has pacemaker -Was in RVR for some time but this has normalized with IVF and agitation control -Continue Eliquis -continue BB  Stage 3b CKD -Appears to be stable -Will follow   COVID TEST STILL PENDING -will consult palliative care for GOC-- if does not improve, may not be appropriate for SNF-- discussed with son    Family Communication/Anticipated D/C date and plan/Code Status   DVT prophylaxis: Lovenox ordered. Code Status: Full Code.  Family Communication: spoke with son Disposition Plan: Status is: Observation  The patient will require care spanning > 2 midnights and should be moved to inpatient because: Inpatient level of care appropriate due to severity of illness  Dispo: The patient is from: ALF              Anticipated d/c is to: tbd              Patient currently is not medically stable to d/c.   Difficult to place patient No  Medical Consultants:    None.    Subjective:   Says she is thirsty  Objective:    Vitals:   04/08/20 0830 04/08/20 0900 04/08/20 0957 04/08/20 1010  BP: 107/70  (!) 93/58 92/79  Pulse: 85  (!) 112 79  Resp: (!) 26 (!) 22 (!) 23 (!) 21  Temp: 99 F (37.2 C)  99.3 F (37.4 C)   TempSrc: Axillary  Axillary   SpO2: 94%  93% 95%    Intake/Output Summary (Last 24 hours) at 04/08/2020 1109 Last data filed at 04/08/2020 0933 Gross per  24 hour  Intake 123 ml  Output 900 ml  Net -777 ml   There were no vitals filed for this visit.  Exam:  General: Appearance:    Obese female laying in bed with mittens on     Lungs:     respirations unlabored  Heart:    Normal heart rate. irr  MS:   All extremities are intact.   Neurologic:   Awake, pleasant and cooperative- unable to answer all questions     Data Reviewed:   I have personally reviewed following labs and imaging studies:  Labs: Labs show the following:   Basic Metabolic Panel: Recent Labs  Lab 04/07/20 1107 04/08/20 0259  NA 136 137  K 4.0 4.0  CL 100 102  CO2 29 29  GLUCOSE 105* 115*  BUN 19 14  CREATININE 1.39* 1.25*  CALCIUM 8.9 9.0   GFR CrCl cannot be calculated (Unknown ideal weight.). Liver Function Tests: Recent Labs  Lab 04/07/20 1107  AST 15  ALT 9  ALKPHOS 140*  BILITOT 0.5  PROT 6.8  ALBUMIN 3.6   No results for input(s): LIPASE, AMYLASE in the last 168 hours. No results for input(s): AMMONIA in the last 168 hours. Coagulation profile Recent Labs  Lab 04/07/20 1107  INR 1.2    CBC: Recent Labs  Lab 04/07/20 1107 04/08/20 0259  WBC 6.8 8.6  NEUTROABS 5.4  --   HGB 12.9 12.9  HCT 39.6 38.6  MCV 94.5 92.6  PLT 219 192   Cardiac Enzymes: No results for input(s): CKTOTAL, CKMB, CKMBINDEX, TROPONINI in the last 168 hours. BNP (last 3 results) Recent Labs    11/26/19 1330  PROBNP 205   CBG: No results for input(s): GLUCAP in the last 168 hours. D-Dimer: No results for input(s): DDIMER in the last 72 hours. Hgb A1c: No results for input(s): HGBA1C in the last 72 hours. Lipid Profile: No results for input(s): CHOL, HDL, LDLCALC, TRIG, CHOLHDL, LDLDIRECT in the last 72 hours. Thyroid function studies: No results for input(s): TSH, T4TOTAL, T3FREE, THYROIDAB in the last 72 hours.  Invalid input(s): FREET3 Anemia work up: No results for input(s): VITAMINB12, FOLATE, FERRITIN, TIBC, IRON, RETICCTPCT in the  last 72 hours. Sepsis Labs: Recent Labs  Lab 04/07/20 1107 04/07/20 1113 04/07/20 1536 04/08/20 0259  PROCALCITON  --   --  <0.10  --   WBC 6.8  --   --  8.6  LATICACIDVEN  --  1.6  --   --     Microbiology Recent Results (from the past 240 hour(s))  Urine culture     Status: None   Collection Time: 04/07/20 11:21 AM   Specimen: In/Out Cath Urine  Result Value Ref Range Status   Specimen Description IN/OUT CATH URINE  Final   Special Requests NONE  Final   Culture   Final    NO GROWTH Performed at [facility], [address], [place], [place] [address]    Report Status 04/08/2020 FINAL  Final  MRSA PCR Screening     Status: None   Collection Time: 04/08/20  2:42 AM   Specimen: Nasopharyngeal  Result Value Ref Range Status   MRSA by PCR NEGATIVE NEGATIVE Final    Comment:        The GeneXpert MRSA Assay (FDA approved for NASAL specimens only), is one component of a comprehensive MRSA colonization surveillance program. It is not intended to diagnose MRSA infection nor to guide or monitor treatment for MRSA infections. Performed at [facility], [address], [place], [place] [address]     Procedures and diagnostic studies:  CT Head Wo Contrast  Result Date: 04/07/2020 CLINICAL DATA:  Delirium.  Increased weakness and confusion EXAM: CT HEAD WITHOUT CONTRAST TECHNIQUE: Contiguous axial images were obtained from the base of the skull through the vertex without intravenous contrast. COMPARISON:  03/16/2020 FINDINGS: Brain: The heart size is normal. No substantial pericardial effusion. Diffuse loss of parenchymal volume is consistent with atrophy. Patchy low attenuation in the deep hemispheric and periventricular white matter is nonspecific, but likely reflects chronic microvascular ischemic demyelination. Vascular: No hyperdense vessel or unexpected calcification. Skull: No evidence for fracture. No worrisome lytic or sclerotic lesion. Sinuses/Orbits: The  visualized paranasal sinuses and mastoid air cells are clear. Visualized portions of the globes and intraorbital fat are unremarkable. Other: None. IMPRESSION: 1. No acute intracranial abnormality. 2. Atrophy with chronic small vessel white matter ischemic disease. Electronically Signed   By: Kennith Center M.D.   On: 04/07/2020 13:27   DG Chest Port 1 View  Result Date: 04/07/2020 CLINICAL DATA:  Possible sepsis. EXAM: PORTABLE CHEST 1 VIEW COMPARISON:  03/16/2020 FINDINGS: Heart size upper limits of normal. Chronic aortic atherosclerosis. Dual lead pacemaker with leads in the region of the right atrium and right ventricle. Pulmonary vascularity is normal. The lungs are clear. No infiltrate, collapse or effusion. No acute bone finding. IMPRESSION: No active disease. Pacemaker. Aortic atherosclerosis. Electronically Signed   By: Paulina Fusi M.D.   On: 04/07/2020 12:06    Medications:   . apixaban  2.5 mg Oral BID  . aspirin  81 mg Oral Daily  . DULoxetine  60 mg Oral Daily  . famotidine  20 mg Oral Q0600  . lipase/protease/amylase  72,000 Units Oral TID AC  . loratadine  10 mg Oral Daily  . metoprolol tartrate  25 mg Oral BID  . pantoprazole  40 mg Oral Daily  . pregabalin  75 mg Oral TID  . QUEtiapine  75 mg Oral QHS  . sodium chloride flush  3 mL Intravenous Q12H   Continuous Infusions: . cefTRIAXone (ROCEPHIN)  IV Stopped (04/07/20 1211)  . lactated ringers 100 mL/hr at 04/08/20 0229     LOS: 0 days   Joseph Art  [facility]   How to contact the [facility] Attending or Consulting provider 7A - 7P or covering provider during after hours 7P -7A, for this patient?  1. Check the care team in [facility] and look for a) attending/consulting [facility] provider listed and b) the [facility] team listed 2. Log into [email or web address] and use [place]'s universal password to access. If you do not have the password, please contact the hospital operator. 3. Locate the [facility] provider you are looking for under  [facility] and page to a number that you can be directly reached. 4. If you still have difficulty  reaching the provider, please page the [facility] (Director on Call) for the Hospitalists listed on amion for assistance.  04/08/2020, 11:09 AM

## 2020-04-08 NOTE — Progress Notes (Signed)
Per RN, take and record vitals at 1510.

## 2020-04-08 NOTE — Evaluation (Signed)
Occupational Therapy Evaluation Patient Details Name: Deborah Chung MRN: [id number] DOB: 1926/08/23 Today's Date: 04/08/2020    History of Present Illness Deborah Chung is a 87 y.o. female with medical history significant of chronic systolic CHF; pacemaker placement; HTN; HLD; anxiety/depression; and afib presenting with weakness, confusion, and visual hallucinations.  She was diagnosed with a UTI and started on antibiotics yesterday.  She is prone to falls and comes periodically for that reason.  Today, she was unable to assist in getting out of bed.  She is always groggy and confused but apparently moreso than usual.  They sent her in for this reason.  She was diagnosed with a UTI 2 nights ago because she was acting confused.  No obvious urinary symptoms.  Baseline mental status with hallucinations, not usually agitated and it comes in and out of regular conversation.    Has limited short-term memory.  She has had progressive dementia, has not discussed memory care.   Clinical Impression   Patient admitted for the above diagnosis.  Family provides information obtained.  PTA she was declining cognitively with increasing work finding difficulties and progressing forgetfulness, but was able to participate with transfers, and was able to feed herself.  Deficits listed below.  Currently she is needing up to total assist for mobility and self feeding.  OT to follow in the acute setting to maximize functional status.  SNF is recommended with the ultimate goal of returning to the ALF.      Follow Up Recommendations  SNF    Equipment Recommendations  None recommended by OT    Recommendations for Other Services       Precautions / Restrictions Precautions Precautions: Fall Precaution Comments: family states increasing falls over the past few months Restrictions Weight Bearing Restrictions: No      Mobility Bed Mobility Overal bed mobility: Needs Assistance             General bed  mobility comments: +2 for basic bed mobility.    Transfers                                                                 ADL either performed or assessed with clinical judgement   ADL                                         General ADL Comments: unable to assess.  Patient is Max A for self feeding.  Total assist for ADL and toileting per NT.     Vision Baseline Vision/History: Wears glasses Wears Glasses: At all times Patient Visual Report: No change from baseline       Perception     Praxis      Pertinent Vitals/Pain Pain Assessment: Faces Faces Pain Scale: Hurts little more Pain Intervention(s): Monitored during session     Hand Dominance Right   Extremity/Trunk Assessment Upper Extremity Assessment Upper Extremity Assessment: Difficult to assess due to impaired cognition   Lower Extremity Assessment Lower Extremity Assessment: Defer to PT evaluation   Cervical / Trunk Assessment Cervical / Trunk Assessment: Kyphotic   Communication Communication Communication: Expressive difficulties   Cognition Arousal/Alertness: Awake/alert Behavior During Therapy: Flat affect  Overall Cognitive Status: Impaired/Different from baseline Area of Impairment: Orientation;Memory;Safety/judgement;Awareness;Problem solving;Following commands                               General Comments: patient has dementia at baseline,  but family states increased word finding and forgetfulness over the last few months.   General Comments  Unable to sit without +2    Exercises     Shoulder Instructions      Home Living Family/patient expects to be discharged to:: Assisted living                             Home Equipment: Walker - 2 wheels;Shower seat;Toilet riser;Grab bars - toilet;Grab bars - tub/shower;Hand held shower head;Wheelchair - manual;Other (comment) (lift chair)          Prior  Functioning/Environment Level of Independence: Needs assistance  Gait / Transfers Assistance Needed: Per family: patient was performing stand pivot transfers with one assist.  bed to wheelchair/toilet/showerchair. ADL's / Homemaking Assistance Needed: Per family: patient was able to feed herself and participate with grooming seated.  Needed assist with hygiene, ADL, meals and home management. Communication / Swallowing Assistance Needed: garbled speech. Difficult to understand Comments: Patient slept most of the day in her recliner.  Was pushed in wheelchair to dinning room and activities.  Patient was declining cognitively.        OT Problem List: Decreased strength;Decreased activity tolerance;Impaired balance (sitting and/or standing);Decreased safety awareness;Decreased cognition;Decreased coordination;Pain;Obesity      OT Treatment/Interventions: Self-care/ADL training;Therapeutic exercise;Cognitive remediation/compensation;Therapeutic activities;Patient/family education;Balance training    OT Goals(Current goals can be found in the care plan section) Acute Rehab OT Goals Patient Stated Goal: Family states she has to perform basic transfers with one assist for ALF OT Goal Formulation: With family Time For Goal Achievement: 04/22/20 Potential to Achieve Goals: Fair ADL Goals Pt Will Perform Eating: with set-up;sitting Pt Will Perform Grooming: with supervision;sitting Pt Will Transfer to Toilet: stand pivot transfer;with mod assist;bedside commode Additional ADL Goal #1: patient will perform supine to sit with min A to increase independence with toileting. Additional ADL Goal #2: Patient will sit edge of bed with min guard for up to 5 minutes to increase independence with toileting.  OT Frequency: Min 2X/week   Barriers to D/C:    needs to be able to transfer to stay at ALF       Co-evaluation              AM-PAC OT "6 Clicks" Daily Activity     Outcome Measure Help from  another person eating meals?: A Lot Help from another person taking care of personal grooming?: Total Help from another person toileting, which includes using toliet, bedpan, or urinal?: Total Help from another person bathing (including washing, rinsing, drying)?: Total Help from another person to put on and taking off regular upper body clothing?: Total Help from another person to put on and taking off regular lower body clothing?: Total 6 Click Score: 7   End of Session Nurse Communication: Need for lift equipment  Activity Tolerance: Patient tolerated treatment well Patient left: in bed;with call bell/phone within reach;with bed alarm set;with nursing/sitter in room;with family/visitor present  OT Visit Diagnosis: Unsteadiness on feet (R26.81);Other abnormalities of gait and mobility (R26.89);Muscle weakness (generalized) (M62.81);History of falling (Z91.81);Feeding difficulties (R63.3);Other symptoms and signs involving the nervous system (R29.898)  Time: 4854-6270 OT Time Calculation (min): 20 min Charges:  OT General Charges $OT Visit: 1 Visit  04/08/2020  Luan Pulling, OTR/L  Acute Rehabilitation Services  Office:  [phone]   Suzanna Obey 04/08/2020, 2:04 PM

## 2020-04-08 NOTE — Plan of Care (Signed)
Patient admitted for increasing weakness and confusion from baseline. Diagnosed with UTI two days ago and already began treatment for that. Patient is alert to self, not oriented to place, time, or situation. Safety sitter at bedside. No s/sx of hallucinations or agitation. Mittens on bilateral hands to prevent patient from pulling on things that are necessary for care. Heart rate and BP have stabilized from earlier yesterday. NAD or needs voiced. Will continue to monitor and continue current POC.

## 2020-04-08 NOTE — Evaluation (Signed)
Physical Therapy Evaluation Patient Details Name: Deborah Chung MRN: [id number] DOB: Dec 20, 1926 Today's Date: 04/08/2020   History of Present Illness  Tyanna Hach is a 87 y.o. female with medical history significant of chronic systolic CHF; pacemaker placement; HTN; HLD; anxiety/depression; and afib presenting with weakness, confusion, and visual hallucinations.  She was diagnosed with a UTI and started on antibiotics yesterday.  She is prone to falls and comes periodically for that reason.  Today, she was unable to assist in getting out of bed.  She is always groggy and confused but apparently moreso than usual.  They sent her in for this reason.  She was diagnosed with a UTI 2 nights ago because she was acting confused.  No obvious urinary symptoms.  Baseline mental status with hallucinations, not usually agitated and it comes in and out of regular conversation.    Has limited short-term memory.  She has had progressive dementia, has not discussed memory care.  Clinical Impression  Patient received in bed, NT present as sitter. Patient has B mitts donned. She is alert, but confused. Unable to provide any history. Patient is having difficulty following commands today. She does make some effort to move toward sitting up on side of bed, but requires max +2 to perform task. Sitting balance is zero with posterior lean. Patient will continue to benefit from skilled PT while here to improve functional mobility.       Follow Up Recommendations SNF    Equipment Recommendations  None recommended by PT    Recommendations for Other Services       Precautions / Restrictions Precautions Precautions: Fall Restrictions Weight Bearing Restrictions: No      Mobility  Bed Mobility Overal bed mobility: Needs Assistance Bed Mobility: Supine to Sit;Sit to Supine     Supine to sit: Max assist;+2 for physical assistance Sit to supine: Max assist;+2 for physical assistance   General bed mobility  comments: patient will initiate some movement toward edge of bed as directed but requires max +2 for raising trunk up to seated position.    Transfers                 General transfer comment: not appropriate today  Ambulation/Gait             General Gait Details: unable  Stairs            Wheelchair Mobility    Modified Rankin (Stroke Patients Only)       Balance Overall balance assessment: Needs assistance Sitting-balance support: Feet unsupported Sitting balance-Leahy Scale: Zero Sitting balance - Comments: requires assistance to maintain sitting balance Postural control: Posterior lean                                   Pertinent Vitals/Pain Pain Assessment: Faces Faces Pain Scale: Hurts whole lot Pain Location: B LEs to touch with movement. Pain Descriptors / Indicators: Grimacing;Guarding;Moaning;Sore Pain Intervention(s): Limited activity within patient's tolerance;Monitored during session    Home Living Family/patient expects to be discharged to:: Skilled nursing facility                      Prior Function Level of Independence: Needs assistance   Gait / Transfers Assistance Needed: patient is unable to provide prior functional hisotry information  ADL's / Homemaking Assistance Needed: apparently comes from ALF per chart        Hand Dominance  Extremity/Trunk Assessment   Upper Extremity Assessment Upper Extremity Assessment: Difficult to assess due to impaired cognition    Lower Extremity Assessment Lower Extremity Assessment: Difficult to assess due to impaired cognition       Communication   Communication: Expressive difficulties  Cognition Arousal/Alertness: Awake/alert Behavior During Therapy: WFL for tasks assessed/performed Overall Cognitive Status: Impaired/Different from baseline Area of Impairment: Orientation;Memory;Safety/judgement;Awareness;Problem solving;Following commands                  Orientation Level: Disoriented to;Place;Time;Situation   Memory: Decreased short-term memory Following Commands: Follows one step commands inconsistently;Follows one step commands with increased time Safety/Judgement: Decreased awareness of safety;Decreased awareness of deficits Awareness: Intellectual Problem Solving: Slow processing;Requires verbal cues;Requires tactile cues;Decreased initiation General Comments: patient has dementia at baseline but she is more confused than normal per chart      General Comments      Exercises     Assessment/Plan    PT Assessment Patient needs continued PT services  PT Problem List Decreased strength;Decreased mobility;Decreased safety awareness;Decreased activity tolerance;Decreased balance;Pain;Decreased cognition       PT Treatment Interventions DME instruction;Therapeutic exercise;Balance training;Functional mobility training;Therapeutic activities;Patient/family education    PT Goals (Current goals can be found in the Care Plan section)  Acute Rehab PT Goals Patient Stated Goal: none stated PT Goal Formulation: Patient unable to participate in goal setting Time For Goal Achievement: 04/15/20    Frequency Min 2X/week   Barriers to discharge Decreased caregiver support      Co-evaluation               AM-PAC PT "6 Clicks" Mobility  Outcome Measure Help needed turning from your back to your side while in a flat bed without using bedrails?: A Lot Help needed moving from lying on your back to sitting on the side of a flat bed without using bedrails?: A Lot Help needed moving to and from a bed to a chair (including a wheelchair)?: Total Help needed standing up from a chair using your arms (e.g., wheelchair or bedside chair)?: Total Help needed to walk in hospital room?: Total Help needed climbing 3-5 steps with a railing? : Total 6 Click Score: 8    End of Session   Activity Tolerance: Patient tolerated  treatment well;Other (comment) (limited by confusion) Patient left: in bed;with bed alarm set;with nursing/sitter in room Nurse Communication: Mobility status PT Visit Diagnosis: Muscle weakness (generalized) (M62.81);Difficulty in walking, not elsewhere classified (R26.2);Other abnormalities of gait and mobility (R26.89);Pain Pain - Right/Left:  (B LE) Pain - part of body: Leg    Time: 8144-8185 PT Time Calculation (min) (ACUTE ONLY): 22 min   Charges:   PT Evaluation $PT Eval Moderate Complexity: 1 Mod PT Treatments $Therapeutic Activity: 8-22 mins        Blu Mcglaun, PT, GCS 04/08/20,10:03 AM

## 2020-04-09 ENCOUNTER — Inpatient Hospital Stay (HOSPITAL_COMMUNITY): Payer: Medicare PPO

## 2020-04-09 DIAGNOSIS — N3000 Acute cystitis without hematuria: Secondary | ICD-10-CM | POA: Diagnosis not present

## 2020-04-09 DIAGNOSIS — R410 Disorientation, unspecified: Secondary | ICD-10-CM | POA: Diagnosis not present

## 2020-04-09 DIAGNOSIS — Z7189 Other specified counseling: Secondary | ICD-10-CM

## 2020-04-09 DIAGNOSIS — F0391 Unspecified dementia with behavioral disturbance: Secondary | ICD-10-CM | POA: Diagnosis not present

## 2020-04-09 DIAGNOSIS — I4891 Unspecified atrial fibrillation: Secondary | ICD-10-CM

## 2020-04-09 DIAGNOSIS — Z515 Encounter for palliative care: Secondary | ICD-10-CM

## 2020-04-09 DIAGNOSIS — F03918 Unspecified dementia, unspecified severity, with other behavioral disturbance: Secondary | ICD-10-CM

## 2020-04-09 LAB — BASIC METABOLIC PANEL
Anion gap: 5 (ref 5–15)
BUN: 18 mg/dL (ref 8–23)
CO2: 28 mmol/L (ref 22–32)
Calcium: 8.4 mg/dL — ABNORMAL LOW (ref 8.9–10.3)
Chloride: 106 mmol/L (ref 98–111)
Creatinine, Ser: 1.3 mg/dL — ABNORMAL HIGH (ref 0.44–1.00)
GFR, Estimated: 39 mL/min — ABNORMAL LOW (ref >60)
Glucose, Bld: 105 mg/dL — ABNORMAL HIGH (ref 70–99)
Potassium: 3.8 mmol/L (ref 3.5–5.1)
Sodium: 139 mmol/L (ref 135–145)

## 2020-04-09 LAB — CBC
HCT: 35 % — ABNORMAL LOW (ref 36.0–46.0)
Hemoglobin: 11.1 g/dL — ABNORMAL LOW (ref 12.0–15.0)
MCH: 30 pg (ref 26.0–34.0)
MCHC: 31.7 g/dL (ref 30.0–36.0)
MCV: 94.6 fL (ref 80.0–100.0)
Platelets: 170 10*3/uL (ref 150–400)
RBC: 3.7 MIL/uL — ABNORMAL LOW (ref 3.87–5.11)
RDW: 14.8 % (ref 11.5–15.5)
WBC: 5.7 10*3/uL (ref 4.0–10.5)
nRBC: 0 % (ref 0.0–0.2)

## 2020-04-09 LAB — ECHOCARDIOGRAM LIMITED

## 2020-04-09 MED ORDER — METOPROLOL TARTRATE 25 MG PO TABS
25.0000 mg | ORAL_TABLET | Freq: Two times a day (BID) | ORAL | Status: DC
  Administered 2020-04-09 – 2020-04-11 (×5): 25 mg via ORAL
  Filled 2020-04-09 (×5): qty 1

## 2020-04-09 MED ORDER — METOPROLOL TARTRATE 5 MG/5ML IV SOLN
5.0000 mg | INTRAVENOUS | Status: AC | PRN
  Administered 2020-04-09 (×2): 5 mg via INTRAVENOUS
  Filled 2020-04-09 (×2): qty 5

## 2020-04-09 MED ORDER — METOPROLOL TARTRATE 12.5 MG HALF TABLET: 37.5000 mg | ORAL_TABLET | Freq: Two times a day (BID) | ORAL | Status: DC

## 2020-04-09 MED ORDER — DIGOXIN 0.25 MG/ML IJ SOLN
0.1250 mg | Freq: Once | INTRAMUSCULAR | Status: AC
  Administered 2020-04-09: 0.125 mg via INTRAVENOUS
  Filled 2020-04-09: qty 0.5

## 2020-04-09 NOTE — Progress Notes (Signed)
Progress Note    Deborah Chung  SVX:[id number] DOB: 1926-06-24  DOA: 04/07/2020 PCP: Etta Grandchild, MD    Brief Narrative:     Medical records reviewed and are as summarized below:  Deborah Chung is an 87 y.o. female with medical history significant of chronic systolic CHF; pacemaker placement; HTN; HLD; anxiety/depression; and afib presenting with weakness, confusion, and visual hallucinations.  She was diagnosed with a UTI and started on antibiotics yesterday.  She is prone to falls and comes periodically for that reason.  Today, she was unable to assist in getting out of bed.  She is always groggy and confused but apparently moreso than usual.  They sent her in for this reason.  She was diagnosed with a UTI 2 nights ago because she was acting confused.  Assessment/Plan:   Principal Problem:   Acute cystitis without hematuria Active Problems:   Atrial fibrillation (HCC)   Essential hypertension, benign   Pacemaker   Hallucinations due to late onset dementia (HCC)   Stage 3b chronic kidney disease (HCC)   UTI (urinary tract infection)   Probable UTI -SIRS criteria in this patient includes: Fever, tachycardia  -Patient has NO evidence of acute organ failure that is not easily explained by another condition and so does not appear to have sepsis at this time. -Sepsis protocol initiated in ER -Suspected source is UTI - she was diagnosed with UTI at her facility 2 days ago and started on Bactrim -Blood and urine cultures NGTD -Treat with IV Rocephin for possible UTI- stop date of 3/28 placed  Dementia with psychosis -much improved on 3/27-- able to engage and participate in exam/hpi -Continue Cymbalta, Seroquel, Trazodone, Xanax  Chronic diastolic CHF -Most recent echo was in 11/2019 and showed preserved EF and grade 1 diastolic dysfunction -Hold Demadex and Aldactone for now but resume as soon as appropriate- limited echo for EF ordered 3/27  HTN -Continue  Lopressor  HLD -She does not appear to be taking medications for this issue at this time which is appropriate for her age  Afib- permana -Has pacemaker -episodes of fast rate -Continue Eliquis -continue BB -IV dig x1  Stage 3b CKD -Appears to be stable -Will follow   Seen by PT/OT- plan for SNF with return to prior living at ALF-- patient able to participate at this time so she is appropriate for SNF level of care   Family Communication/Anticipated D/C date and plan/Code Status   DVT prophylaxis: eliquis Code Status: dnr Family Communication: spoke with son Disposition Plan: Status is: inpt  The patient will require care spanning > 2 midnights and should be moved to inpatient because: Inpatient level of care appropriate due to severity of illness  Dispo: The patient is from: ALF              Anticipated d/c is to: tbd-- SNF              Patient currently is not medically stable to d/c.   Difficult to place patient No         Medical Consultants:    None.    Subjective:  Hungry this AM  Objective:    Vitals:   04/09/20 0436 04/09/20 0733 04/09/20 0814 04/09/20 0842  BP: (!) 120/107 (!) 91/58 117/71 101/83  Pulse: 75 (!) 147 (!) 131 74  Resp:  18 17   Temp: 97.7 F (36.5 C)  98.4 F (36.9 C)   TempSrc: Oral  Oral   SpO2:  94%  93%     Intake/Output Summary (Last 24 hours) at 04/09/2020 1017 Last data filed at 04/09/2020 0900 Gross per 24 hour  Intake 240 ml  Output 200 ml  Net 40 ml   There were no vitals filed for this visit.  Exam:   General: Appearance:    Obese female in no acute distress     Lungs:     respirations unlabored  Heart:    Normal heart rate.irr   MS:   All extremities are intact.   Neurologic:   Awake, alert, pleasant and cooperative      Data Reviewed:   I have personally reviewed following labs and imaging studies:  Labs: Labs show the following:   Basic Metabolic Panel: Recent Labs  Lab 04/07/20 1107  04/08/20 0259 04/09/20 0134  NA 136 137 139  K 4.0 4.0 3.8  CL 100 102 106  CO2 29 29 28   GLUCOSE 105* 115* 105*  BUN 19 14 18   CREATININE 1.39* 1.25* 1.30*  CALCIUM 8.9 9.0 8.4*  MG  --  2.2  --    GFR CrCl cannot be calculated (Unknown ideal weight.). Liver Function Tests: Recent Labs  Lab 04/07/20 1107  AST 15  ALT 9  ALKPHOS 140*  BILITOT 0.5  PROT 6.8  ALBUMIN 3.6   No results for input(s): LIPASE, AMYLASE in the last 168 hours. No results for input(s): AMMONIA in the last 168 hours. Coagulation profile Recent Labs  Lab 04/07/20 1107  INR 1.2    CBC: Recent Labs  Lab 04/07/20 1107 04/08/20 0259 04/09/20 0134  WBC 6.8 8.6 5.7  NEUTROABS 5.4  --   --   HGB 12.9 12.9 11.1*  HCT 39.6 38.6 35.0*  MCV 94.5 92.6 94.6  PLT 219 192 170   Cardiac Enzymes: No results for input(s): CKTOTAL, CKMB, CKMBINDEX, TROPONINI in the last 168 hours. BNP (last 3 results) Recent Labs    11/26/19 1330  PROBNP 205   CBG: No results for input(s): GLUCAP in the last 168 hours. D-Dimer: No results for input(s): DDIMER in the last 72 hours. Hgb A1c: No results for input(s): HGBA1C in the last 72 hours. Lipid Profile: No results for input(s): CHOL, HDL, LDLCALC, TRIG, CHOLHDL, LDLDIRECT in the last 72 hours. Thyroid function studies: No results for input(s): TSH, T4TOTAL, T3FREE, THYROIDAB in the last 72 hours.  Invalid input(s): FREET3 Anemia work up: No results for input(s): VITAMINB12, FOLATE, FERRITIN, TIBC, IRON, RETICCTPCT in the last 72 hours. Sepsis Labs: Recent Labs  Lab 04/07/20 1107 04/07/20 1113 04/07/20 1536 04/08/20 0259 04/09/20 0134  PROCALCITON  --   --  <0.10  --   --   WBC 6.8  --   --  8.6 5.7  LATICACIDVEN  --  1.6  --   --   --     Microbiology Recent Results (from the past 240 hour(s))  Blood culture (routine x 2)     Status: None (Preliminary result)   Collection Time: 04/07/20 11:07 AM   Specimen: BLOOD  Result Value Ref Range  Status   Specimen Description BLOOD RIGHT ANTECUBITAL  Final   Special Requests   Final    BOTTLES DRAWN AEROBIC AND ANAEROBIC Blood Culture results may not be optimal due to an inadequate volume of blood received in culture bottles   Culture   Final    NO GROWTH 1 DAY Performed at [facility], [address] [place], [address] [place]  Report Status PENDING  Incomplete  Blood culture (routine x 2)     Status: None (Preliminary result)   Collection Time: 04/07/20 11:08 AM   Specimen: BLOOD  Result Value Ref Range Status   Specimen Description BLOOD LEFT ANTECUBITAL  Final   Special Requests Blood Culture adequate volume  Final   Culture   Final    NO GROWTH 1 DAY Performed at [facility], [address] [place], [place] [address]    Report Status PENDING  Incomplete  Resp Panel by RT-PCR (Flu A&B, Covid) Nasopharyngeal Swab     Status: None   Collection Time: 04/07/20 11:21 AM   Specimen: Nasopharyngeal Swab; Nasopharyngeal(NP) swabs in vial transport medium  Result Value Ref Range Status   SARS Coronavirus 2 by RT PCR NEGATIVE NEGATIVE Final    Comment: (NOTE) SARS-CoV-2 target nucleic acids are NOT DETECTED.  The SARS-CoV-2 RNA is generally detectable in upper respiratory specimens during the acute phase of infection. The lowest concentration of SARS-CoV-2 viral copies this assay can detect is 138 copies/mL. A negative result does not preclude SARS-Cov-2 infection and should not be used as the sole basis for treatment or other patient management decisions. A negative result may occur with  improper specimen collection/handling, submission of specimen other than nasopharyngeal swab, presence of viral mutation(s) within the areas targeted by this assay, and inadequate number of viral copies(<138 copies/mL). A negative result must be combined with clinical observations, patient history, and epidemiological information. The expected result is Negative.  Fact  Sheet for Patients:  BloggerCourse.com  Fact Sheet for Healthcare Providers:  SeriousBroker.it  This test is no t yet approved or cleared by the [place] FDA and  has been authorized for detection and/or diagnosis of SARS-CoV-2 by FDA under an Emergency Use Authorization (EUA). This EUA will remain  in effect (meaning this test can be used) for the duration of the COVID-19 declaration under Section 564(b)(1) of the Act, 21 U.S.C.section 360bbb-3(b)(1), unless the authorization is terminated  or revoked sooner.       Influenza A by PCR NEGATIVE NEGATIVE Final   Influenza B by PCR NEGATIVE NEGATIVE Final    Comment: (NOTE) The Xpert Xpress SARS-CoV-2/FLU/RSV plus assay is intended as an aid in the diagnosis of influenza from Nasopharyngeal swab specimens and should not be used as a sole basis for treatment. Nasal washings and aspirates are unacceptable for Xpert Xpress SARS-CoV-2/FLU/RSV testing.  Fact Sheet for Patients: BloggerCourse.com  Fact Sheet for Healthcare Providers: SeriousBroker.it  This test is not yet approved or cleared by the [place] FDA and has been authorized for detection and/or diagnosis of SARS-CoV-2 by FDA under an Emergency Use Authorization (EUA). This EUA will remain in effect (meaning this test can be used) for the duration of the COVID-19 declaration under Section 564(b)(1) of the Act, 21 U.S.C. section 360bbb-3(b)(1), unless the authorization is terminated or revoked.  Performed at [facility], [address], [place], [place] [address]   Urine culture     Status: None   Collection Time: 04/07/20 11:21 AM   Specimen: In/Out Cath Urine  Result Value Ref Range Status   Specimen Description IN/OUT CATH URINE  Final   Special Requests NONE  Final   Culture   Final    NO GROWTH Performed at [facility], [address],  [place], [place] [address]    Report Status 04/08/2020 FINAL  Final  MRSA PCR Screening  Status: None   Collection Time: 04/08/20  2:42 AM   Specimen: Nasopharyngeal  Result Value Ref Range Status   MRSA by PCR NEGATIVE NEGATIVE Final    Comment:        The GeneXpert MRSA Assay (FDA approved for NASAL specimens only), is one component of a comprehensive MRSA colonization surveillance program. It is not intended to diagnose MRSA infection nor to guide or monitor treatment for MRSA infections. Performed at [facility], [address] [place], [place] [address]     Procedures and diagnostic studies:  CT Head Wo Contrast  Result Date: 04/07/2020 CLINICAL DATA:  Delirium.  Increased weakness and confusion EXAM: CT HEAD WITHOUT CONTRAST TECHNIQUE: Contiguous axial images were obtained from the base of the skull through the vertex without intravenous contrast. COMPARISON:  03/16/2020 FINDINGS: Brain: The heart size is normal. No substantial pericardial effusion. Diffuse loss of parenchymal volume is consistent with atrophy. Patchy low attenuation in the deep hemispheric and periventricular white matter is nonspecific, but likely reflects chronic microvascular ischemic demyelination. Vascular: No hyperdense vessel or unexpected calcification. Skull: No evidence for fracture. No worrisome lytic or sclerotic lesion. Sinuses/Orbits: The visualized paranasal sinuses and mastoid air cells are clear. Visualized portions of the globes and intraorbital fat are unremarkable. Other: None. IMPRESSION: 1. No acute intracranial abnormality. 2. Atrophy with chronic small vessel white matter ischemic disease. Electronically Signed   By: Kennith Center M.D.   On: 04/07/2020 13:27   DG Chest Port 1 View  Result Date: 04/07/2020 CLINICAL DATA:  Possible sepsis. EXAM: PORTABLE CHEST 1 VIEW COMPARISON:  03/16/2020 FINDINGS: Heart size upper limits of normal. Chronic aortic atherosclerosis. Dual lead pacemaker  with leads in the region of the right atrium and right ventricle. Pulmonary vascularity is normal. The lungs are clear. No infiltrate, collapse or effusion. No acute bone finding. IMPRESSION: No active disease. Pacemaker. Aortic atherosclerosis. Electronically Signed   By: Paulina Fusi M.D.   On: 04/07/2020 12:06    Medications:   . apixaban  2.5 mg Oral BID  . aspirin  81 mg Oral Daily  . DULoxetine  60 mg Oral Daily  . famotidine  20 mg Oral Q0600  . lipase/protease/amylase  72,000 Units Oral TID AC  . loratadine  10 mg Oral Daily  . metoprolol tartrate  25 mg Oral BID  . pantoprazole  40 mg Oral Daily  . pregabalin  75 mg Oral TID  . QUEtiapine  75 mg Oral QHS  . sodium chloride flush  3 mL Intravenous Q12H   Continuous Infusions: . cefTRIAXone (ROCEPHIN)  IV 1 g (04/08/20 1159)     LOS: 1 day   Joseph Art  [facility]   How to contact the [facility] Attending or Consulting provider 7A - 7P or covering provider during after hours 7P -7A, for this patient?  1. Check the care team in [facility] and look for a) attending/consulting TR[facility] provider listed and b) the [facility] team listed 2. Log into [email or web address] and use [facility]'s universal password to access. If you do not have the password, please contact the hospital operator. 3. Locate the [facility] provider you are looking for under [facility] and page to a number that you can be directly reached. 4. If you still have difficulty reaching the provider, please page the [facility] (Director on Call) for the Hospitalists listed on amion for assistance.  04/09/2020, 10:17 AM

## 2020-04-09 NOTE — Progress Notes (Signed)
Patient's heart rate continues to fluctuate between the 120s and 150s this morning. Two doses of IV metoprolol 5 mg given as ordered at 0505 and 0600. Patient is asymptomatic - calm and resting. Will pass along to day shift nurse.

## 2020-04-09 NOTE — NC FL2 (Signed)
[place] MEDICAID FL2 LEVEL OF CARE SCREENING TOOL     IDENTIFICATION  Patient Name: Deborah Chung Birthdate: [birth year] Sex: female Admission Date (Current Location): 04/07/2020  [facility] and [place] Number:  Producer, television/film/video and Address:  [facility], [address], [place], [place] [address]      Provider Number: [id number]  Attending Physician Name and Address:  Joseph Art, DO  Relative Name and Phone Number:       Current Level of Care: Hospital Recommended Level of Care: Skilled Nursing Facility Prior Approval Number:    Date Approved/Denied:   PASRR Number: [id number]  Discharge Plan: SNF    Current Diagnoses: Patient Active Problem List   Diagnosis Date Noted  . UTI (urinary tract infection) 04/08/2020  . Tinea corporis 04/21/2019  . 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 Stage 3b chronic kidney disease (HCC) 04/21/2019  . Complete heart block (HCC) 01/28/2019  . Rotator cuff tear arthropathy 01/28/2018  . Primary osteoarthritis involving multiple joints 01/27/2018  . Psychophysiological insomnia 06/24/2017  . Bacterial overgrowth syndrome 08/31/2015  . Non-ischemic cardiomyopathy (HCC) 08/21/2015  . Hyperlipidemia with target LDL less than 100 05/30/2015  . Allergic rhinitis due to pollen 05/30/2015  . Gastroesophageal reflux disease without esophagitis 05/30/2015  . Hyperglycemia 05/30/2015  . Routine general medical examination at a health care facility 05/30/2015  . Acute cystitis without hematuria 05/16/2014  . Pacemaker 08/03/2013  . Atrial fibrillation (HCC) 06/29/2013  . Essential hypertension, benign 06/29/2013  . Hypertriglyceridemia 06/29/2013  . Depression with anxiety 06/29/2013  . PHN (postherpetic neuralgia) 06/29/2013    Orientation RESPIRATION BLADDER Height & Weight     Self  Normal Incontinent Weight:   Height:     BEHAVIORAL SYMPTOMS/MOOD NEUROLOGICAL BOWEL NUTRITION  STATUS      Continent Diet (regular)  AMBULATORY STATUS COMMUNICATION OF NEEDS Skin   Extensive Assist Verbally Normal                       Personal Care Assistance Level of Assistance  Bathing,Feeding,Dressing Bathing Assistance: Maximum assistance Feeding assistance: Maximum assistance Dressing Assistance: Maximum assistance     Functional Limitations Info             SPECIAL CARE FACTORS FREQUENCY  PT (By licensed PT),OT (By licensed OT)     PT Frequency: 5x/wk OT Frequency: 5x/wk            Contractures Contractures Info: Not present    Additional Factors Info  Code Status,Allergies,Psychotropic Code Status Info: DNR Allergies Info: NKA Psychotropic Info: Cymbalta 60mg  daily; Seroquel 75mg  daily at bed         Current Medications (04/09/2020):  This is the current hospital active medication list Current Facility-Administered Medications  Medication Dose Route Frequency Provider Last Rate Last Admin  . acetaminophen (TYLENOL) tablet 650 mg  650 mg Oral Q6H PRN , MD       Or  . acetaminophen (TYLENOL) suppository 650 mg  650 mg Rectal Q6H PRN 04/11/2020, MD      . apixaban Jonah Blue) tablet 2.5 mg  2.5 mg Oral BID Jonah Blue, MD   2.5 mg at 04/09/20 Jonah Blue  . aspirin chewable tablet 81 mg  81 mg Oral Daily 04/11/20, MD   81 mg at 04/09/20 Jonah Blue  . cefTRIAXone (ROCEPHIN) 1 g in sodium chloride 0.9 % 100 mL IVPB  1 g Intravenous Q24H 04/11/20 U, DO  200 mL/hr at 04/09/20 1205 1 g at 04/09/20 1205  . DULoxetine (CYMBALTA) DR capsule 60 mg  60 mg Oral Daily Jonah Blue, MD   60 mg at 04/09/20 5093  . famotidine (PEPCID) tablet 20 mg  20 mg Oral Q0600 Jonah Blue, MD   20 mg at 04/09/20 0556  . lipase/protease/amylase (CREON) capsule 72,000 Units  72,000 Units Oral TID Magda Paganini, MD   72,000 Units at 04/09/20 [phone]  . loratadine (CLARITIN) tablet 10 mg  10 mg Oral Daily Jonah Blue, MD   10 mg at 04/09/20 2458   . metoprolol tartrate (LOPRESSOR) tablet 25 mg  25 mg Oral BID Marlin Canary U, DO   25 mg at 04/09/20 0998  . ondansetron (ZOFRAN) tablet 4 mg  4 mg Oral Q6H PRN Jonah Blue, MD       Or  . ondansetron [facility]) injection 4 mg  4 mg Intravenous Q6H PRN Jonah Blue, MD      . pantoprazole (PROTONIX) EC tablet 40 mg  40 mg Oral Daily Jonah Blue, MD   40 mg at 04/09/20 3382  . polyvinyl alcohol (LIQUIFILM TEARS) 1.4 % ophthalmic solution 1 drop  1 drop Both Eyes QID PRN Jonah Blue, MD      . pregabalin (LYRICA) capsule 75 mg  75 mg Oral TID Jonah Blue, MD   75 mg at 04/09/20 5053  . QUEtiapine (SEROQUEL) tablet 75 mg  75 mg Oral Noemi Chapel, MD   75 mg at 04/08/20 2112  . sodium chloride flush (NS) 0.9 % injection 3 mL  3 mL Intravenous Q12H Jonah Blue, MD   3 mL at 04/09/20 0835  . traZODone (DESYREL) tablet 50 mg  50 mg Oral QHS PRN Jonah Blue, MD         Discharge Medications: Please see discharge summary for a list of discharge medications.  Relevant Imaging Results:  Relevant Lab Results:   Additional Information SS#: [id number]; Patient has received both Moderna vaccines and booster vaccine  Baldemar Lenis, LCSW

## 2020-04-09 NOTE — Plan of Care (Signed)
No acute changes since the previous night that I took care of her. Gave metoprolol 5 mg as ordered IVP for heart rate sustaining above 120 bpm. Safety sitter order has expired, patient has been calm and resting tonight. Will continue to monitor and continue current POC.

## 2020-04-09 NOTE — Consult Note (Signed)
Consultation Note Date: 04/09/2020   Patient Name: Deborah Chung  DOB: [birth year]  MRN: [id number]  Age / Sex: 87 y.o., female  PCP: Janith Lima, MD Referring Physician: Geradine Girt, DO  Reason for Consultation: Establishing goals of care  HPI/Patient Profile: 87 y.o. female  with past medical history of dementia, chronic systolic CHF, pacemaker, HTN, HLD, anxiety, depression, afib admitted on 04/07/2020 with weakness, confusion, and visual hallucinations from ALF. The day before admission, diagnosed with UTI and started on antibiotics. Hospital admission for SIRS, suspected UTI receiving IV rocephin. Stop date 3/28. Patient tolerating PT/OT with plans for SNF rehab. Palliative medicine consultation for goals of care.    Clinical Assessment and Goals of Care:  I have reviewed medical records, discussed with care team, and met patient at bedside. Deborah Chung is awake. She is oriented to self otherwise disoriented with baseline dementia. She is pleasant and has no complaints. I attempted to reorient her to time and situation. She does tell me she has three sons. No family at bedside.   Call placed to son, Deborah Chung to discuss goals of care.   I introduced Palliative Medicine as specialized medical care for people living with serious illness. It focuses on providing relief from the symptoms and stress of a serious illness. The goal is to improve quality of life for both the patient and the family.  We discussed a brief life review of the patient. Deborah Chung confirms three sons and that he is POA. She lived in independent living for many years but with progression of dementia, required transition to ALF at [place] approximately 1 year ago. Deborah Chung reports a slow decline in health from her dementia. It has gotten harder for her to assist with transfers from bed to wheelchair and etc.   Discussed events leading up to  admission and course of hospitalization including diagnoses, interventions, plan of care.   I attempted to elicit values and goals of care important to the patient and son. Deborah Chung confirms plan for SNF rehab on discharge. He is hopeful she will show improvement and ability to return to ALF following rehab. He understands she may need higher level of care moving forward.  Advanced directives, concepts specific to code status were discussed. Deborah Chung reports she has a documented living will with him as POA. He confirms decision for DNR. Documentation not on file. Requested. May benefit from MOST form completion when son available.   Palliative Care services outpatient were explained and offered. Son would like for palliative to follow outpatient. Questions and concerns addressed. Reassured of TOC involvement for SNF placement.     SUMMARY OF RECOMMENDATIONS    Continue current plan of care and medical management. Patient clinically improving and plan is for SNF rehab on discharge. TOC following.  Continue PT/OT/SLP efforts.  Ultimately, son hopeful for her return to ALF after rehab. He also acknowledges she may need higher level of care in the near future with progressive decline from dementia.  Patient has a documented living will. Not of  file. Requested for EMR.  Son interested in outpatient palliative f/u.  May benefit from MOST form completion when son available.   Code Status/Advance Care Planning:  DNR: confirmed by son Deborah Chung)   Symptom Management:   Per attending  Palliative Prophylaxis:   Aspiration, Bowel Regimen, Delirium Protocol, Frequent Pain Assessment, Oral Care and Turn Reposition  Psycho-social/Spiritual:   Desire for further Chaplaincy support: yes  Additional Recommendations: Caregiving  Support/Resources  Prognosis:   Unable to determine  Discharge Planning: [place] for rehab with Palliative care service follow-up      Primary  Diagnoses: Present on Admission: . (Resolved) Sepsis secondary to UTI ([place]) . Acute cystitis without hematuria . Atrial fibrillation ([place]) . Essential hypertension, benign . Pacemaker . Hallucinations due to late onset dementia ([place]) . Stage 3b chronic kidney disease ([place]) . UTI (urinary tract infection)   I have reviewed the medical record, interviewed the patient and family, and examined the patient. The following aspects are pertinent.  Past Medical History:  Diagnosis Date  . Arthritis   . Atrial fibrillation ([place])   . Blood transfusion without reported diagnosis   . Depression   . GAD (generalized anxiety disorder)   . Hearing loss   . Hyperlipidemia   . Hypertension   . Insomnia   . Osteoarthritis   . Pacemaker   . PHN (postherpetic neuralgia) 2010  . Systolic CHF [facility])    Social History   Socioeconomic History  . Marital status: Widowed    Spouse name: Not on file  . Number of children: 3  . Years of education: Not on file  . Highest education level: Not on file  Occupational History  . Occupation: retired  Tobacco Use  . Smoking status: Never Smoker  . Smokeless tobacco: Never Used  Vaping Use  . Vaping Use: Never used  Substance and Sexual Activity  . Alcohol use: Not Currently  . Drug use: Never  . Sexual activity: Never  Other Topics Concern  . Not on file  Social History Narrative   ** Merged History Encounter **       ** Merged History Encounter **       Social Determinants of Health   Financial Resource Strain: Not on file  Food Insecurity: Not on file  Transportation Needs: Not on file  Physical Activity: Not on file  Stress: Not on file  Social Connections: Not on file   Family History  Problem Relation Age of Onset  . Heart disease Father   . Heart disease Sister    Scheduled Meds: . apixaban  2.5 mg Oral BID  . aspirin  81 mg Oral Daily  . DULoxetine  60 mg Oral Daily  . famotidine  20 mg Oral Q0600  .  lipase/protease/amylase  72,000 Units Oral TID AC  . loratadine  10 mg Oral Daily  . metoprolol tartrate  25 mg Oral BID  . pantoprazole  40 mg Oral Daily  . pregabalin  75 mg Oral TID  . QUEtiapine  75 mg Oral QHS  . sodium chloride flush  3 mL Intravenous Q12H   Continuous Infusions: . cefTRIAXone (ROCEPHIN)  IV 1 g (04/09/20 1205)   PRN Meds:.acetaminophen **OR** acetaminophen, ondansetron **OR** ondansetron (ZOFRAN) IV, polyvinyl alcohol, traZODone Medications Prior to Admission:  Prior to Admission medications   Medication Sig Start Date End Date Taking? Authorizing Provider  acetaminophen (TYLENOL) 325 MG tablet Take 650 mg by mouth 2 (two) times daily as needed (pain).  Yes [provider]  ALPRAZolam (XANAX) 0.25 MG tablet TAKE 1 TABLET BY MOUTH AS NEEDED FOR ANXIETY Patient taking differently: Take 0.25 mg by mouth daily as needed for anxiety. 05/13/19  Yes Janith Lima, MD  apixaban (ELIQUIS) 2.5 MG TABS tablet Take 2.5 mg by mouth 2 (two) times daily.   Yes [provider]  aspirin 81 MG chewable tablet Chew 81 mg by mouth daily.   Yes [provider]  Biotin 10000 MCG TABS Take 10,000 mcg by mouth daily.   Yes [provider]  calcium carbonate (TUMS) 500 MG chewable tablet Chew 1 tablet (200 mg of elemental calcium total) by mouth 2 (two) times daily as needed for indigestion or heartburn. 06/03/19  Yes Janith Lima, MD  diphenoxylate-atropine (LOMOTIL) 2.5-0.025 MG tablet Take 1 tablet by mouth every 6 (six) hours as needed for diarrhea or loose stools.   Yes [provider]  DULoxetine (CYMBALTA) 60 MG capsule Take 1 capsule (60 mg total) by mouth daily. 01/27/18  Yes Janith Lima, MD  esomeprazole (NEXIUM) 20 MG capsule Take 1 capsule (20 mg total) by mouth daily at 12 noon. 07/16/19  Yes Janith Lima, MD  famotidine (PEPCID) 20 MG tablet Take 20 mg by mouth daily at 6 (six) AM. 04/17/19  Yes [provider]   guaiFENesin (ROBITUSSIN) 100 MG/5ML liquid Take 200 mg by mouth every 4 (four) hours as needed for cough.   Yes [provider]  lipase/protease/amylase (CREON) 36000 UNITS CPEP capsule Take 2 capsules with each meal and 1 capsule with each snack Patient taking differently: Take 72,000 Units by mouth 3 (three) times daily before meals. 11/03/18  Yes Milus Banister, MD  loratadine (CLARITIN) 10 MG tablet Take 10 mg by mouth daily.   Yes [provider]  metoprolol tartrate (LOPRESSOR) 25 MG tablet TAKE 1 TABLET BY MOUTH TWICE A DAY Patient taking differently: Take 25 mg by mouth 2 (two) times daily. (BETA BLOCKER) 01/13/18  Yes Janith Lima, MD  NON FORMULARY Apply 1 each topically daily at 12 noon. Jobst Hose KN XL   Yes [provider]  nystatin (MYCOSTATIN/NYSTOP) powder Apply 1 application topically 3 (three) times daily. Under both breast   Yes [provider]  ondansetron (ZOFRAN) 4 MG tablet Take 4 mg by mouth every 4 (four) hours as needed for nausea or vomiting.   Yes [provider]  polyvinyl alcohol (LIQUIFILM TEARS) 1.4 % ophthalmic solution Place 1 drop into both eyes 4 (four) times daily as needed for dry eyes.   Yes [provider]  pregabalin (LYRICA) 75 MG capsule Take 1 capsule (75 mg total) by mouth 3 (three) times daily. 10/22/19  Yes Janith Lima, MD  Probiotic Product (PROBIOTIC PO) Take 1 capsule by mouth daily.   Yes [provider]  QUEtiapine (SEROQUEL) 50 MG tablet Take 75 mg by mouth at bedtime.   Yes [provider]  spironolactone (ALDACTONE) 25 MG tablet TAKE 1 TABLET BY MOUTH EVERY DAY Patient taking differently: Take by mouth daily. 06/12/18  Yes Janith Lima, MD  sulfamethoxazole-trimethoprim (BACTRIM DS) 800-160 MG tablet Take 1 tablet by mouth 2 (two) times daily.   Yes [provider]  torsemide (DEMADEX) 10 MG tablet Take 10 mg by mouth daily.   Yes [provider]  traZODone (DESYREL) 50 MG tablet TAKE 1 TABLET (50 MG TOTAL) BY MOUTH AT BEDTIME AS NEEDED FOR SLEEP. Patient taking differently:  Take 50 mg by mouth at bedtime as needed for sleep. 05/03/18  Yes Janith Lima, MD  QUEtiapine (SEROQUEL) 100 MG tablet Take 1 tablet (100 mg total) by mouth at bedtime. Patient not taking: Reported on 04/07/2020 12/29/19   Janith Lima, MD   No Known Allergies Review of Systems  Unable to perform ROS: Dementia    Physical Exam Vitals and nursing note reviewed.  Constitutional:      General: She is awake.  HENT:     Head: Normocephalic and atraumatic.  Pulmonary:     Effort: No tachypnea, accessory muscle usage or respiratory distress.  Skin:    General: Skin is warm and dry.  Neurological:     Mental Status: She is alert.     Comments: Oriented to self, otherwise disoriented with pleasant confusion. Baseline dementia  Psychiatric:        Mood and Affect: Mood normal.        Speech: Speech normal.        Cognition and Memory: Cognition is impaired.    Vital Signs: BP (!) 111/51 (BP Location: Right Arm)   Pulse 98   Temp 98.5 F (36.9 C) (Oral)   Resp 17   SpO2 91%  Pain Scale: 0-10   Pain Score: 0-No pain   SpO2: SpO2: 91 % O2 Device:SpO2: 91 % O2 Flow Rate: .   IO: Intake/output summary:   Intake/Output Summary (Last 24 hours) at 04/09/2020 1500 Last data filed at 04/09/2020 0900 Gross per 24 hour  Intake 120 ml  Output 200 ml  Net -80 ml    LBM: Last BM Date: 04/09/20 Baseline Weight:   Most recent weight:       Palliative Assessment/Data: PPS 50%   Flowsheet Rows   Flowsheet Row Most Recent Value  Intake Tab   Referral Department Hospitalist  Unit at Time of Referral Med/Surg Unit  Palliative Care Primary Diagnosis Neurology  Palliative Care Type New Palliative care  Reason for referral Clarify Goals of Care  Date first seen by Palliative Care 04/09/20  Clinical Assessment   Palliative Performance Scale Score  50%  Psychosocial & Spiritual Assessment   Palliative Care Outcomes   Patient/Family meeting held? Yes  Who was at the meeting? son Deborah Chung)  [place] goals of care, Provided psychosocial or spiritual support, Linked to palliative care logitudinal support, ACP counseling assistance       Time Total: 60 Greater than 50%  of this time was spent counseling and coordinating care related to the above assessment and plan.  Signed by:  Ihor Dow, DNP, FNP-C Palliative Medicine Team  Phone: [phone] Fax: [phone]   Please contact Palliative Medicine Team phone at [phone] for questions and concerns.  For individual provider: See Shea Evans

## 2020-04-09 NOTE — Progress Notes (Signed)
  Echocardiogram 2D Echocardiogram has been performed.  Deborah Chung 04/09/2020, 11:37 AM

## 2020-04-09 NOTE — Progress Notes (Signed)
   04/09/20 0814  Assess: MEWS Score  Temp 98.4 F (36.9 C)  BP 117/71  Pulse Rate (!) 131  Resp 17  Level of Consciousness Alert  SpO2 93 %  O2 Device Room Air  Patient Activity (if Appropriate) In bed  Assess: MEWS Score  MEWS Temp 0  MEWS Systolic 0  MEWS Pulse 3  MEWS RR 0  MEWS LOC 0  MEWS Score 3  MEWS Score Color Yellow  Treat  MEWS Interventions Administered scheduled meds/treatments  Take Vital Signs  Increase Vital Sign Frequency  Yellow: Q 2hr X 2 then Q 4hr X 2, if remains yellow, continue Q 4hrs  Notify: Charge Nurse/RN  Name of Charge Nurse/RN Notified Marissa  Date Charge Nurse/RN Notified 04/09/20  Time Charge Nurse/RN Notified 0815  Notify: Provider  Provider Name/Title Dr Benjamine Mola  Date Provider Notified 04/09/20  Time Provider Notified 0800  Notification Type Call  Notification Reason Change in status  Provider response See new orders;At bedside  Date of Provider Response 04/09/20  Time of Provider Response [phone]  Document  Patient Outcome Stabilized after interventions  Progress note created (see row info) Yes

## 2020-04-09 NOTE — TOC Initial Note (Signed)
Transition of Care [facility]) - Initial/Assessment Note    Patient Details  Name: Deborah Chung MRN: [id number] Date of Birth: [birth year]  [facility] CM/SW Contact:    Baldemar Lenis, LCSW Phone Number: 04/09/2020, 12:24 PM  Clinical Narrative:    Patient originally from [facility] with recommendation for SNF. CSW spoke with patient's son, [name], via phone to discuss recommendation for SNF. [name] indicated that he was expecting as much, and was agreeing to SNF placement. CSW explained process for searching for SNF and that someone will follow up with him to provide bed offers. Robert asked about expected length of stay and what happens if the patient doesn't return to an ALF appropriate level, and CSW answered his questions. CSW has faxed out referral and will follow. Patient has received both COVID vaccines and booster vaccine.               Expected Discharge Plan: Skilled Nursing Facility Barriers to Discharge: Continued Medical Work up,Insurance Authorization   Patient Goals and CMS Choice Patient states their goals for this hospitalization and ongoing recovery are:: patient unable to participate in goal setting, only oriented to self CMS Medicare.gov Compare Post Acute Care list provided to:: Patient Represenative (must comment) Choice offered to / list presented to : Adult Children  Expected Discharge Plan and Services Expected Discharge Plan: Skilled Nursing Facility     Post Acute Care Choice: Skilled Nursing Facility Living arrangements for the past 2 months: Assisted Living Facility                                      Prior Living Arrangements/Services Living arrangements for the past 2 months: Assisted Living Facility Lives with:: Facility Resident Patient language and need for interpreter reviewed:: No Do you feel safe going back to the place where you live?: Yes      Need for Family Participation in Patient Care: Yes (Comment) Care giver  support system in place?: No (comment) Current home services: DME Criminal Activity/Legal Involvement Pertinent to Current Situation/Hospitalization: No - Comment as needed  Activities of Daily Living      Permission Sought/Granted Permission sought to share information with : Facility Contractor granted to share information with : Yes, Verbal Permission Granted  Share Information with NAME: [name]  Permission granted to share info w AGENCY: SNF  Permission granted to share info w Relationship: Son     Emotional Assessment   Attitude/Demeanor/Rapport: Unable to Assess Affect (typically observed): Unable to Assess Orientation: : Oriented to Self Alcohol / Substance Use: Not Applicable Psych Involvement: No (comment)  Admission diagnosis:  Delirium [R41.0] Sepsis secondary to UTI (HCC) [A41.9, N39.0] UTI (urinary tract infection) [N39.0] Patient Active Problem List   Diagnosis Date Noted  . UTI (urinary tract infection) 04/08/2020  . Tinea corporis 04/21/2019  . 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 Stage 3b chronic kidney disease (HCC) 04/21/2019  . Complete heart block (HCC) 01/28/2019  . Rotator cuff tear arthropathy 01/28/2018  . Primary osteoarthritis involving multiple joints 01/27/2018  . Psychophysiological insomnia 06/24/2017  . Bacterial overgrowth syndrome 08/31/2015  . Non-ischemic cardiomyopathy (HCC) 08/21/2015  . Hyperlipidemia with target LDL less than 100 05/30/2015  . Allergic rhinitis due to pollen 05/30/2015  . Gastroesophageal reflux disease without esophagitis 05/30/2015  . Hyperglycemia 05/30/2015  . Routine general medical examination at a health care facility  05/30/2015  . Acute cystitis without hematuria 05/16/2014  . Pacemaker 08/03/2013  . Atrial fibrillation (HCC) 06/29/2013  . Essential hypertension, benign 06/29/2013  . Hypertriglyceridemia 06/29/2013  . Depression with anxiety  06/29/2013  . PHN (postherpetic neuralgia) 06/29/2013   PCP:  Etta Grandchild, MD Pharmacy:   [id number] - , [place] - [address] AT [facility] OF [address] & [address] [facility] [address] Phone: [phone] Fax: [phone]     Social Determinants of Health (SDOH) Interventions    Readmission Risk Interventions No flowsheet data found.

## 2020-04-10 DIAGNOSIS — N3000 Acute cystitis without hematuria: Secondary | ICD-10-CM | POA: Diagnosis not present

## 2020-04-10 LAB — SARS CORONAVIRUS 2 (TAT 6-24 HRS): SARS Coronavirus 2: NEGATIVE

## 2020-04-10 MED ORDER — METOPROLOL TARTRATE 5 MG/5ML IV SOLN
5.0000 mg | INTRAVENOUS | Status: DC | PRN
  Administered 2020-04-10: 5 mg via INTRAVENOUS
  Filled 2020-04-10 (×2): qty 5

## 2020-04-10 NOTE — TOC Progression Note (Addendum)
Transition of Care [facility]) - Progression Note    Patient Details  Name: Mallery Harshman MRN: [id number] Date of Birth: [birth year]  Transition of Care [facility] CM/SW Contact  Erin Sons, [place] Phone Number: 04/10/2020, 10:08 AM  Clinical Narrative:     CSW called pt son [name] and provided bed offers. Son prefers [facility] and requests CSW check with them. If [facility] cannot accept he explained he is inclined to go with [facility].   CSW called Kitty with [facility] who explained that DON is reviewing pt to see if they can accept pt.   1330: Kitty confirmed they can offer a bed tomorrow. CSW notified pt son. Son is wanting to go with [facility] for rehab. CSW attempted to start auth with [facility]. [facility] states they don't currently manage this pt but are going to "submit a ticket" to double check. CSW notified Georgeann Oppenheim, she will try and start auth directly with [facility]. Covid test requested.   Expected Discharge Plan: Skilled Nursing Facility Barriers to Discharge: Continued Medical Work up,Insurance Authorization  Expected Discharge Plan and Services Expected Discharge Plan: Skilled Nursing Facility     Post Acute Care Choice: Skilled Nursing Facility Living arrangements for the past 2 months: Assisted Living Facility                                       Social Determinants of Health (SDOH) Interventions    Readmission Risk Interventions No flowsheet data found.

## 2020-04-10 NOTE — Plan of Care (Signed)
VSS stable, MEWS protocol almost complete. Patient is COVID neg at this time. Discharge plan to [facility] SNF possibly tomorrow. Pacemaker interrogated today, heart rate sustaining in the 70s. NAD. Will continue to monitor and continue current POC.

## 2020-04-10 NOTE — Progress Notes (Signed)
   04/10/20 0410 04/10/20 0538 04/10/20 0615  Assess: MEWS Score  Temp 97.9 F (36.6 C)  --  97.8 F (36.6 C)  BP (!) 134/112 (!) 109/57 (!) 107/52  Pulse Rate (!) 139 (!) 57 70  Resp 16  --   --   Level of Consciousness Alert Alert Alert  SpO2 94 % 94 % 95 %  O2 Device Room Air Room Air Room Air  Assess: MEWS Score  MEWS Temp 0 0 0  MEWS Systolic 0 0 0  MEWS Pulse 3 0 0  MEWS RR 0 0 0  MEWS LOC 0 0 0  MEWS Score 3 0 0  MEWS Score Color Yellow Green Green  Assess: if the MEWS score is Yellow or Red  Were vital signs taken at a resting state? Yes  --   --   Focused Assessment No change from prior assessment  --   --   Early Detection of Sepsis Score *See Row Information* Low  --   --   MEWS guidelines implemented *See Row Information* Yes  --   --   Treat  MEWS Interventions Escalated (See documentation below)  --   --   Pain Scale 0-10  --   --   Pain Score 0  --   --   Take Vital Signs  Increase Vital Sign Frequency  Yellow: Q 2hr X 2 then Q 4hr X 2, if remains yellow, continue Q 4hrs  --   --   Escalate  MEWS: Escalate Yellow: discuss with charge nurse/RN and consider discussing with provider and RRT  --   --   Notify: Charge Nurse/RN  Name of Charge Nurse/RN Notified Pam, RN  --   --   Date Charge Nurse/RN Notified 04/10/20  --   --   Time Charge Nurse/RN Notified [phone]  --   --   Notify: Provider  Provider Name/Title Bruna Potter, NP  --   --   Date Provider Notified 04/10/20  --   --   Time Provider Notified [phone]  --   --   Notification Type Page  --   --   Notification Reason Change in status (HR sustaining in 130s at rest/YELLOW mews/new med orders?)  --   --   Provider response See new orders  --   --   Date of Provider Response 04/10/20  --   --   Time of Provider Response [phone]  --   --   Document  Patient Outcome Stabilized after interventions  --   --   Progress note created (see row info) Yes  --   --    Yellow MEWS protocol in place. Will pass along to day  shift nurse.

## 2020-04-10 NOTE — Progress Notes (Signed)
Progress Note    Deborah Chung  POE:[id number] DOB: 14-Dec-1926  DOA: 04/07/2020 PCP: Etta Grandchild, MD    Brief Narrative:     Medical records reviewed and are as summarized below:  Deborah Chung is an 87 y.o. female with medical history significant of chronic systolic CHF; pacemaker placement; HTN; HLD; anxiety/depression; and afib presenting with weakness, confusion, and visual hallucinations.  She was diagnosed with a UTI and started on antibiotics yesterday.  She is prone to falls and comes periodically for that reason.  Today, she was unable to assist in getting out of bed.  She is always groggy and confused but apparently moreso than usual.  They sent her in for this reason.  She was diagnosed with a UTI 2 nights ago because she was acting confused.  Assessment/Plan:   Principal Problem:   Acute cystitis without hematuria Active Problems:   Atrial fibrillation (HCC)   Essential hypertension, benign   Pacemaker   Goals of care, counseling/discussion   Hallucinations due to late onset dementia (HCC)   Stage 3b chronic kidney disease (HCC)   UTI (urinary tract infection)   Delirium   Dementia with behavioral disturbance (HCC)   Palliative care by specialist   Probable UTI -SIRS criteria in this patient includes: Fever, tachycardia  -Patient has NO evidence of acute organ failure that is not easily explained by another condition and so does not appear to have sepsis at this time. -Sepsis protocol initiated in ER -Suspected source is UTI - she was diagnosed with UTI at her facility 2 days ago and started on Bactrim -Blood and urine cultures NGTD -Treat with IV Rocephin for possible UTI- stop date of 3/28 placed  Dementia with psychosis -much improved on 3/27-- able to engage and participate in exam/hpi -Continue Cymbalta, Seroquel, Trazodone, Xanax  Chronic diastolic CHF -Most recent echo was in 11/2019 and showed preserved EF and grade 1 diastolic  dysfunction -Hold Demadex and Aldactone for now but resume as soon as appropriate- limited echo for EF ordered 3/27:  EF 60-65% -upon d/c demadex may need to be PRN  HTN -Continue Lopressor  HLD -She does not appear to be taking medications for this issue at this time which is appropriate for her age  Afib- permana -Has pacemaker -episodes of fast rate -Continue Eliquis -continue BB -IV dig x1  Stage 3b CKD -Appears to be stable -Will follow   Seen by PT/OT- plan for SNF with return to prior living at ALF-- patient able to participate with therapy so she is appropriate for SNF level of care   Family Communication/Anticipated D/C date and plan/Code Status   DVT prophylaxis: eliquis Code Status: dnr Family Communication: spoke with son Disposition Plan: Status is: inpt  The patient will require care spanning > 2 midnights and should be moved to inpatient because: Inpatient level of care appropriate due to severity of illness  Dispo: The patient is from: ALF              Anticipated d/c is to: tbd-- SNF              Patient currently is not medically stable to d/c. d/c in AM?   Difficult to place patient No         Medical Consultants:    None.    Subjective:    C/o leg soreness (TED hose rolled down)   Objective:    Vitals:   04/10/20 0410 04/10/20 3154 04/10/20 0615 04/10/20 0820  BP: (!) 134/112 (!) 109/57 (!) 107/52 123/71  Pulse: (!) 139 (!) 57 70 69  Resp: 16  16 16   Temp: 97.9 F (36.6 C)  97.8 F (36.6 C) 98.4 F (36.9 C)  TempSrc: Oral  Oral Oral  SpO2: 94% 94% 95% 99%    Intake/Output Summary (Last 24 hours) at 04/10/2020 1159 Last data filed at 04/09/2020 1628 Gross per 24 hour  Intake 423.33 ml  Output 450 ml  Net -26.67 ml   There were no vitals filed for this visit.  Exam:   General: Appearance:    Obese female in no acute distress     Lungs:      respirations unlabored  Heart:    Normal heart rate. irregular   MS:   All extremities are intact.   Neurologic:   Awake, pleasant and cooperative      Data Reviewed:   I have personally reviewed following labs and imaging studies:  Labs: Labs show the following:   Basic Metabolic Panel: Recent Labs  Lab 04/07/20 1107 04/08/20 0259 04/09/20 0134  NA 136 137 139  K 4.0 4.0 3.8  CL 100 102 106  CO2 29 29 28   GLUCOSE 105* 115* 105*  BUN 19 14 18   CREATININE 1.39* 1.25* 1.30*  CALCIUM 8.9 9.0 8.4*  MG  --  2.2  --    GFR CrCl cannot be calculated (Unknown ideal weight.). Liver Function Tests: Recent Labs  Lab 04/07/20 1107  AST 15  ALT 9  ALKPHOS 140*  BILITOT 0.5  PROT 6.8  ALBUMIN 3.6   No results for input(s): LIPASE, AMYLASE in the last 168 hours. No results for input(s): AMMONIA in the last 168 hours. Coagulation profile Recent Labs  Lab 04/07/20 1107  INR 1.2    CBC: Recent Labs  Lab 04/07/20 1107 04/08/20 0259 04/09/20 0134  WBC 6.8 8.6 5.7  NEUTROABS 5.4  --   --   HGB 12.9 12.9 11.1*  HCT 39.6 38.6 35.0*  MCV 94.5 92.6 94.6  PLT 219 192 170   Cardiac Enzymes: No results for input(s): CKTOTAL, CKMB, CKMBINDEX, TROPONINI in the last 168 hours. BNP (last 3 results) Recent Labs    11/26/19 1330  PROBNP 205   CBG: No results for input(s): GLUCAP in the last 168 hours. D-Dimer: No results for input(s): DDIMER in the last 72 hours. Hgb A1c: No results for input(s): HGBA1C in the last 72 hours. Lipid Profile: No results for input(s): CHOL, HDL, LDLCALC, TRIG, CHOLHDL, LDLDIRECT in the last 72 hours. Thyroid function studies: No results for input(s): TSH, T4TOTAL, T3FREE, THYROIDAB in the last 72 hours.  Invalid input(s): FREET3 Anemia work up: No results for input(s): VITAMINB12, FOLATE, FERRITIN, TIBC, IRON, RETICCTPCT in the last 72 hours. Sepsis Labs: Recent Labs  Lab 04/07/20 1107 04/07/20 1113 04/07/20 1536 04/08/20 0259 04/09/20 0134  PROCALCITON  --   --  <0.10  --   --   WBC 6.8   --   --  8.6 5.7  LATICACIDVEN  --  1.6  --   --   --     Microbiology Recent Results (from the past 240 hour(s))  Blood culture (routine x 2)     Status: None (Preliminary result)   Collection Time: 04/07/20 11:07 AM   Specimen: BLOOD  Result Value Ref Range Status   Specimen Description BLOOD RIGHT ANTECUBITAL  Final   Special Requests   Final    BOTTLES DRAWN AEROBIC AND ANAEROBIC Blood  Culture results may not be optimal due to an inadequate volume of blood received in culture bottles   Culture   Final    NO GROWTH 3 DAYS Performed at [facility], [address], [place], [place] [address]    Report Status PENDING  Incomplete  Blood culture (routine x 2)     Status: None (Preliminary result)   Collection Time: 04/07/20 11:08 AM   Specimen: BLOOD  Result Value Ref Range Status   Specimen Description BLOOD LEFT ANTECUBITAL  Final   Special Requests Blood Culture adequate volume  Final   Culture   Final    NO GROWTH 3 DAYS Performed at [facility], [address] [place], [place] [address]    Report Status PENDING  Incomplete  Resp Panel by RT-PCR (Flu A&B, Covid) Nasopharyngeal Swab     Status: None   Collection Time: 04/07/20 11:21 AM   Specimen: Nasopharyngeal Swab; Nasopharyngeal(NP) swabs in vial transport medium  Result Value Ref Range Status   SARS Coronavirus 2 by RT PCR NEGATIVE NEGATIVE Final    Comment: (NOTE) SARS-CoV-2 target nucleic acids are NOT DETECTED.  The SARS-CoV-2 RNA is generally detectable in upper respiratory specimens during the acute phase of infection. The lowest concentration of SARS-CoV-2 viral copies this assay can detect is 138 copies/mL. A negative result does not preclude SARS-Cov-2 infection and should not be used as the sole basis for treatment or other patient management decisions. A negative result may occur with  improper specimen collection/handling, submission of specimen other than nasopharyngeal swab, presence of  viral mutation(s) within the areas targeted by this assay, and inadequate number of viral copies(<138 copies/mL). A negative result must be combined with clinical observations, patient history, and epidemiological information. The expected result is Negative.  Fact Sheet for Patients:  BloggerCourse.com  Fact Sheet for Healthcare Providers:  SeriousBroker.it  This test is no t yet approved or cleared by the [place] FDA and  has been authorized for detection and/or diagnosis of SARS-CoV-2 by FDA under an Emergency Use Authorization (EUA). This EUA will remain  in effect (meaning this test can be used) for the duration of the COVID-19 declaration under Section 564(b)(1) of the Act, 21 U.S.C.section 360bbb-3(b)(1), unless the authorization is terminated  or revoked sooner.       Influenza A by PCR NEGATIVE NEGATIVE Final   Influenza B by PCR NEGATIVE NEGATIVE Final    Comment: (NOTE) The Xpert Xpress SARS-CoV-2/FLU/RSV plus assay is intended as an aid in the diagnosis of influenza from Nasopharyngeal swab specimens and should not be used as a sole basis for treatment. Nasal washings and aspirates are unacceptable for Xpert Xpress SARS-CoV-2/FLU/RSV testing.  Fact Sheet for Patients: BloggerCourse.com  Fact Sheet for Healthcare Providers: SeriousBroker.it  This test is not yet approved or cleared by the [place] FDA and has been authorized for detection and/or diagnosis of SARS-CoV-2 by FDA under an Emergency Use Authorization (EUA). This EUA will remain in effect (meaning this test can be used) for the duration of the COVID-19 declaration under Section 564(b)(1) of the Act, 21 U.S.C. section 360bbb-3(b)(1), unless the authorization is terminated or revoked.  Performed at [facility], [address], [place], [place] [address]   Urine culture     Status: None    Collection Time: 04/07/20 11:21 AM   Specimen: In/Out Cath Urine  Result Value Ref Range Status   Specimen Description IN/OUT CATH URINE  Final  Special Requests NONE  Final   Culture   Final    NO GROWTH Performed at [facility], [address], [place], [place] [address]    Report Status 04/08/2020 FINAL  Final  MRSA PCR Screening     Status: None   Collection Time: 04/08/20  2:42 AM   Specimen: Nasopharyngeal  Result Value Ref Range Status   MRSA by PCR NEGATIVE NEGATIVE Final    Comment:        The GeneXpert MRSA Assay (FDA approved for NASAL specimens only), is one component of a comprehensive MRSA colonization surveillance program. It is not intended to diagnose MRSA infection nor to guide or monitor treatment for MRSA infections. Performed at [facility], [address], [place], [place] [address]     Procedures and diagnostic studies:  ECHOCARDIOGRAM LIMITED  Result Date: 04/09/2020    ECHOCARDIOGRAM LIMITED REPORT   Patient Name:   GARDA JUHNKE Date of Exam: 04/09/2020 Medical Rec #:  [id number]     Height:       64.0 in Accession #:    [id number]    Weight:       185.0 lb Date of Birth:  01-11-1927      BSA:          1.893 m Patient Age:    89 years      BP:           101/83 mmHg Patient Gender: F             HR:           92 bpm. Exam Location:  Inpatient Procedure: Limited Echo, Limited Color Doppler and Cardiac Doppler Indications:    atrial fibrillation  History:        Patient has prior history of Echocardiogram examinations, most                 recent 11/26/2019. Abnormal ECG and Pacemaker, chronic kidney                 disease, Arrythmias:Atrial Fibrillation; Risk                 Factors:Hypertension and Dyslipidemia.  Sonographer:    Delcie Roch Referring Phys: [id number] U Lenis N[place]leton IMPRESSIONS  1. Left ventricular ejection fraction, by estimation, is 60 to 65%. The left ventricle has normal function. The left ventricle has no regional wall  motion abnormalities. Left ventricular diastolic parameters are indeterminate.  2. Right ventricular systolic function is normal. The right ventricular size is normal.  3. Left atrial size was mildly dilated.  4. The mitral valve is normal in structure. Trivial mitral valve regurgitation. No evidence of mitral stenosis.  5. The aortic valve is tricuspid. Aortic valve regurgitation is not visualized. Mild aortic valve sclerosis is present, with no evidence of aortic valve stenosis.  6. The inferior vena cava is normal in size with greater than 50% respiratory variability, suggesting right atrial pressure of 3 mmHg. FINDINGS  Left Ventricle: Left ventricular ejection fraction, by estimation, is 60 to 65%. The left ventricle has normal function. The left ventricle has no regional wall motion abnormalities. The left ventricular internal cavity size was normal in size. There is  no left ventricular hypertrophy. Left ventricular diastolic parameters are indeterminate. Right Ventricle: The right ventricular size is normal.Right ventricular systolic function is normal. Left Atrium: Left atrial size was mildly dilated. Right Atrium: Right atrial size was normal in size. Pericardium: There is  no evidence of pericardial effusion. Mitral Valve: The mitral valve is normal in structure. Trivial mitral valve regurgitation. No evidence of mitral valve stenosis. Tricuspid Valve: The tricuspid valve is normal in structure. Tricuspid valve regurgitation is trivial. No evidence of tricuspid stenosis. Aortic Valve: The aortic valve is tricuspid. Aortic valve regurgitation is not visualized. Mild aortic valve sclerosis is present, with no evidence of aortic valve stenosis. Pulmonic Valve: The pulmonic valve was normal in structure. Pulmonic valve regurgitation is not visualized. No evidence of pulmonic stenosis. Aorta: The aortic root is normal in size and structure. Venous: The inferior vena cava is normal in size with greater than 50%  respiratory variability, suggesting right atrial pressure of 3 mmHg.  Additional Comments: A device lead is visualized. IVC IVC diam: 1.30 cm  AORTA Ao Asc diam: 3.10 cm Olga MillersBrian Crenshaw MD Electronically signed by Olga MillersBrian Crenshaw MD Signature Date/Time: 04/09/2020/12:12:04 PM    Final     Medications:   . apixaban  2.5 mg Oral BID  . aspirin  81 mg Oral Daily  . DULoxetine  60 mg Oral Daily  . famotidine  20 mg Oral Q0600  . lipase/protease/amylase  72,000 Units Oral TID AC  . loratadine  10 mg Oral Daily  . metoprolol tartrate  25 mg Oral BID  . pantoprazole  40 mg Oral Daily  . pregabalin  75 mg Oral TID  . QUEtiapine  75 mg Oral QHS  . sodium chloride flush  3 mL Intravenous Q12H   Continuous Infusions: . cefTRIAXone (ROCEPHIN)  IV 1 g (04/09/20 1205)     LOS: 2 days   Joseph ArtJessica U Matthan Sledge  [facility]   How to contact the [facility] Attending or Consulting provider 7A - 7P or covering provider during after hours 7P -7A, for this patient?  1. Check the care team in [facility] and look for a) attending/consulting [facility] provider listed and b) the [facility] team listed 2. Log into [email or web address] and use [facility]'s universal password to access. If you do not have the password, please contact the hospital operator. 3. Locate the [facility] provider you are looking for under [facility] and page to a number that you can be directly reached. 4. If you still have difficulty reaching the provider, please page the [facility] (Director on Call) for the Hospitalists listed on amion for assistance.  04/10/2020, 11:59 AM

## 2020-04-10 NOTE — Progress Notes (Signed)
   Dr. Benjamine Mola reached out to me to help arrange interrogation of pacemaker. Patient has a [employer] officer, political party. Called and spoke with rep Juanda Bond who interrogated device. Per Christiane Ha, device is functioning normally. Intermittent atrial fibrillation/flutter with RVR noted. She had a 12.8 hour episode of atrial fibrillation/flutter on 04/03/2020 and "a number of days" with 3+ hours of atrial fibrillation/flutter over the past week. At the time of device interrogation, patient was in sinus rhythm. Patient has known paroxysmal atrial fibrillation and is on anticoagulation with Eliquis.  Corrin Parker, PA-C 04/10/2020 1:32 PM

## 2020-04-10 NOTE — Plan of Care (Signed)
Patient's HR has been much better this shift since digoxin was given on day shift. Patient is more alert and talking more, still oriented only to self. New IV placed and EKG is scheduled for this morning. Yellow MEWS protocol still in place, patient's VSS are stable. NAD or needs voiced. Will continue to monitor and continue current POC.

## 2020-04-11 DIAGNOSIS — N3000 Acute cystitis without hematuria: Secondary | ICD-10-CM | POA: Diagnosis not present

## 2020-04-11 LAB — BASIC METABOLIC PANEL
Anion gap: 4 — ABNORMAL LOW (ref 5–15)
BUN: 13 mg/dL (ref 8–23)
CO2: 28 mmol/L (ref 22–32)
Calcium: 8.5 mg/dL — ABNORMAL LOW (ref 8.9–10.3)
Chloride: 107 mmol/L (ref 98–111)
Creatinine, Ser: 0.88 mg/dL (ref 0.44–1.00)
GFR, Estimated: >60 mL/min (ref >60)
Glucose, Bld: 90 mg/dL (ref 70–99)
Potassium: 4 mmol/L (ref 3.5–5.1)
Sodium: 139 mmol/L (ref 135–145)

## 2020-04-11 MED ORDER — TORSEMIDE 10 MG PO TABS: 10.0000 mg | ORAL_TABLET | Freq: Every day | ORAL | Status: DC | PRN

## 2020-04-11 NOTE — Progress Notes (Signed)
[facility].  Report called to [place].  Discharge instructions reviewed with receiving nurse.  Patient discharged via [facility] to SNF by stretcher accompanied by two attendants.

## 2020-04-11 NOTE — Plan of Care (Signed)
  Problem: Education: Goal: Knowledge of General Education information will improve Description: Including pain rating scale, medication(s)/side effects and non-pharmacologic comfort measures Outcome: Completed/Met   Problem: Health Behavior/Discharge Planning: Goal: Ability to manage health-related needs will improve Outcome: Completed/Met   Problem: Clinical Measurements: Goal: Ability to maintain clinical measurements within normal limits will improve Outcome: Completed/Met Goal: Will remain free from infection Outcome: Completed/Met Goal: Diagnostic test results will improve Outcome: Completed/Met Goal: Respiratory complications will improve Outcome: Completed/Met Goal: Cardiovascular complication will be avoided Outcome: Completed/Met   Problem: Activity: Goal: Risk for activity intolerance will decrease Outcome: Completed/Met   Problem: Nutrition: Goal: Adequate nutrition will be maintained Outcome: Completed/Met   Problem: Coping: Goal: Level of anxiety will decrease Outcome: Completed/Met   Problem: Elimination: Goal: Will not experience complications related to bowel motility Outcome: Completed/Met Goal: Will not experience complications related to urinary retention Outcome: Completed/Met   Problem: Pain Managment: Goal: General experience of comfort will improve Outcome: Completed/Met   Problem: Safety: Goal: Ability to remain free from injury will improve Outcome: Completed/Met   Problem: Skin Integrity: Goal: Risk for impaired skin integrity will decrease Outcome: Completed/Met   Problem: Acute Rehab PT Goals(only PT should resolve) Goal: Pt Will Go Supine/Side To Sit Outcome: Completed/Met Goal: Pt Will Go Sit To Supine/Side Outcome: Completed/Met Goal: Patient Will Transfer Sit To/From Stand Outcome: Completed/Met Goal: Pt Will Transfer Bed To Chair/Chair To Bed Outcome: Completed/Met Goal: Pt/caregiver will Perform Home Exercise  Program Outcome: Completed/Met   Problem: Acute Rehab OT Goals (only OT should resolve) Goal: Pt. Will Perform Eating Outcome: Completed/Met Goal: Pt. Will Perform Grooming Outcome: Completed/Met Goal: Pt. Will Transfer To Toilet Outcome: Completed/Met Goal: OT Additional ADL Goal #1 Outcome: Completed/Met Goal: OT Additional ADL Goal #2 Outcome: Completed/Met

## 2020-04-11 NOTE — Progress Notes (Signed)
Occupational Therapy Treatment Patient Details Name: Deborah Chung MRN: [id number] DOB: 02-May-1926 Today's Date: 04/11/2020    History of present illness Pt is a 87 yo female admitted 04/07/20 with increased weakness and confusion with hallucinations.  Pt diagnosed with UTI prior to admission on 04/05/20 and had recieved 2 days of antibiotics before this admission.  Further work-up reveals AMS due to UTI ( no urinary symptoms at this time likely due to antibitoics already being on board), acute cystitis without hematuria, and progressive dementia with psychosis.   Pt is from [place] and prone to falls and frequent admissions.  PMH: Afib, HTN, systolic CHF with pacemaker, stage 3 kidney disease, arthritis, depression with GAD, hearing loss, hyperlipdemia, insomnia, OA, PHN, and dementia.   OT comments  Pt in recliner upon arrival. Pt pleasantly confused and required multimodal cues for SPT mod A. Pt required mod A to wash face and hands mod A. OT will continue to follow acutely to maximize level of function and safety  Follow Up Recommendations  SNF    Equipment Recommendations  None recommended by OT    Recommendations for Other Services      Precautions / Restrictions Precautions Precautions: Fall Precaution Comments: family states increasing falls over the past few months Restrictions Weight Bearing Restrictions: No       Mobility Bed Mobility Overal bed mobility: Needs Assistance Bed Mobility: Supine to Sit     Supine to sit: Mod assist     General bed mobility comments: pt in recliner upon arrival    Transfers Overall transfer level: Needs assistance Equipment used: Rolling walker (2 wheeled) Transfers: Sit to/from [employer] Sit to Stand: Mod assist Stand pivot transfers: Mod assist       General transfer comment: Cues for hand placement    Balance Overall balance assessment: Needs assistance Sitting-balance support: Feet  unsupported Sitting balance-Leahy Scale: Poor Sitting balance - Comments: Poor but progressed to fair balance. Postural control: Posterior lean   Standing balance-Leahy Scale: Poor                             ADL either performed or assessed with clinical judgement   ADL Overall ADL's : Needs assistance/impaired     Grooming: Wash/dry hands;Wash/dry face;Moderate assistance;Sitting                                 General ADL Comments: Total assist for ADL and toileting per NT     Vision Baseline Vision/History: Wears glasses Wears Glasses: At all times     Perception     Praxis      Cognition Arousal/Alertness: Awake/alert Behavior During Therapy: Impulsive Overall Cognitive Status: Impaired/Different from baseline Area of Impairment: Orientation;Memory;Safety/judgement;Awareness;Problem solving;Following commands                 Orientation Level: Disoriented to;Place;Time;Situation   Memory: Decreased short-term memory Following Commands: Follows one step commands inconsistently;Follows one step commands with increased time Safety/Judgement: Decreased awareness of safety;Decreased awareness of deficits Awareness: Intellectual Problem Solving: Slow processing;Requires verbal cues;Requires tactile cues;Decreased initiation General Comments: patient has dementia at baseline,  but family states increased word finding and forgetfulness over the last few months.        Exercises     Shoulder Instructions       General Comments      Pertinent Vitals/ Pain  Pain Assessment: Faces Faces Pain Scale: Hurts little more Pain Location: L LE to touch. Pain Descriptors / Indicators: Grimacing;Guarding;Moaning;Sore Pain Intervention(s): Monitored during session;Repositioned  Home Living                                          Prior Functioning/Environment              Frequency  Min 2X/week         Progress Toward Goals  OT Goals(current goals can now be found in the care plan section)  Progress towards OT goals: OT to reassess next treatment  Acute Rehab OT Goals Patient Stated Goal: Family states she has to perform basic transfers with one assist for ALF  Plan Discharge plan remains appropriate    Co-evaluation                 AM-PAC OT "6 Clicks" Daily Activity     Outcome Measure   Help from another person eating meals?: A Little Help from another person taking care of personal grooming?: A Lot Help from another person toileting, which includes using toliet, bedpan, or urinal?: Total Help from another person bathing (including washing, rinsing, drying)?: Total Help from another person to put on and taking off regular upper body clothing?: Total Help from another person to put on and taking off regular lower body clothing?: Total 6 Click Score: 9    End of Session Equipment Utilized During Treatment: Gait belt;Rolling walker  OT Visit Diagnosis: Unsteadiness on feet (R26.81);Other abnormalities of gait and mobility (R26.89);Muscle weakness (generalized) (M62.81);History of falling (Z91.81);Feeding difficulties (R63.3);Other symptoms and signs involving the nervous system (R29.898)   Activity Tolerance Patient tolerated treatment well   Patient Left with call bell/phone within reach;with bed alarm set;with family/visitor present;in chair;with chair alarm set   Nurse Communication          Time: [phone] OT Time Calculation (min): 13 min  Charges: OT General Charges $OT Visit: 1 Visit OT Treatments $Therapeutic Activity: 8-22 mins  Galen Manila 04/11/2020, 3:46 PM

## 2020-04-11 NOTE — TOC Transition Note (Signed)
Transition of Care [facility]) - CM/SW Discharge Note   Patient Details  Name: Deborah Chung MRN: [id number] Date of Birth: 12/22/1926  Transition of Care [facility]) CM/SW Contact:  Erin Sons, LCSW Phone Number: 04/11/2020, 3:31 PM   Clinical Narrative:     Patient will DC to: [facility]  Anticipated DC date: 04/11/20 Family notified: Krisandra, Bueno (Son)  [phone] [facility] Phone) Transport by: Sharin Mons   Per MD patient ready for DC to [facility] . RN, patient, patient's family, and facility notified of DC. Discharge Summary and FL2 sent to facility. RN to call report prior to discharge [phone]). DC packet on chart. Ambulance transport requested for patient.   CSW will sign off for now as social work intervention is no longer needed. Please consult [place] again if new needs arise.     Barriers to Discharge: No Barriers Identified   Patient Goals and CMS Choice Patient states their goals for this hospitalization and ongoing recovery are:: patient unable to participate in goal setting, only oriented to self CMS Medicare.gov Compare Post Acute Care list provided to:: Patient Represenative (must comment) Choice offered to / list presented to : Adult Children  Discharge Placement              Patient chooses bed at: [facility] Patient to be transferred to facility by: [facility] Name of family member notified: Lemmie, Steinhaus (Son)   [phone] [facility] Phone) Patient and family notified of of transfer: 04/11/20  Discharge Plan and Services     Post Acute Care Choice: Skilled Nursing Facility                               Social Determinants of Health (SDOH) Interventions     Readmission Risk Interventions No flowsheet data found.

## 2020-04-11 NOTE — Discharge Summary (Signed)
Physician Discharge Summary  Deborah RampBobbie Mawhinney GLO:[id number] DOB: 09/16/1926 DOA: 04/07/2020  PCP: Etta GrandchildJones, Thomas L, MD  Admit date: 04/07/2020 Discharge date: 04/11/2020  Admitted From: ALF Discharge disposition: SNF   Recommendations for Outpatient Follow-Up:   1. demadex PRN- -if eating/drinking consistently schedule and can add aldactone back 2. TED hose when up with therapy   Discharge Diagnosis:   Principal Problem:   Acute cystitis without hematuria Active Problems:   Atrial fibrillation (HCC)   Essential hypertension, benign   Pacemaker   Goals of care, counseling/discussion   Hallucinations due to late onset dementia (HCC)   Stage 3b chronic kidney disease (HCC)   UTI (urinary tract infection)   Delirium   Dementia with behavioral disturbance (HCC)   Palliative care by specialist    Discharge Condition: Improved.  Diet recommendation: Low sodium, heart healthy  Wound care: None.  Code status: DNR   History of Present Illness:   Deborah Chung is an 87 y.o. female with medical history significant ofchronic systolic CHF; pacemaker placement; HTN; HLD; anxiety/depression; and afib presenting with weakness, confusion, and visual hallucinations. She was diagnosed with a UTI and started on antibiotics yesterday. She is prone to falls and comes periodically for that reason. Today, she was unable to assist in getting out of bed. She is always groggy and confused but apparently moreso than usual. They sent her in for this reason. She was diagnosed with a UTI 2 nights ago because she was acting confused.   Hospital Course by Problem:   Probable UTI -SIRS criteria in this patient includes:Fever, tachycardia  -Suspected source isUTI - she was diagnosed with UTI at her facility 2 days ago and started on Bactrim -Blood and urine cultures NGTD -Treated with IV Rocephin forpossible UTI -improved  Dementia with psychosis -much improved on 3/27-- able to  engage and participate in exam/hpi -Continue Cymbalta, Seroquel, Trazodone, Xanax  Chronic diastolic CHF -Most recent echo was in 11/2019 and showed preserved EF and grade 1 diastolic dysfunction -Hold Demadex and Aldactone for now but resume as soon as appropriate- limited echo for EF ordered 3/27:  EF 60-65% -demadex  PRN-- if eating better or showing signs of volume overload can schedule along with low dose aldactone  HTN -Continue Lopressor  HLD -She does not appear to be taking medications for this issue at this time which is appropriate for her age  Afib- parox -Has pacemaker -episodes of fast rate -Continue Eliquis -continue BB  Stage 3b CKD -Appears to be stable -outpatient BMP     Medical Consultants:    Palliative care   Discharge Exam:   Vitals:   04/11/20 0408 04/11/20 0715  BP: (!) 113/47 (!) 109/55  Pulse: 68 71  Resp: 16 17  Temp: 97.8 F (36.6 C) 97.7 F (36.5 C)  SpO2: 94% 95%   Vitals:   04/10/20 2020 04/11/20 0036 04/11/20 0408 04/11/20 0715  BP: 109/69 108/60 (!) 113/47 (!) 109/55  Pulse: 69 70 68 71  Resp: 16 16 16 17   Temp: 98.1 F (36.7 C) 97.8 F (36.6 C) 97.8 F (36.6 C) 97.7 F (36.5 C)  TempSrc: Oral Oral  Oral  SpO2: 99% 92% 94% 95%    General exam: Appears calm and comfortable.    The results of significant diagnostics from this hospitalization (including imaging, microbiology, ancillary and laboratory) are listed below for reference.     Procedures and Diagnostic Studies:   CT Head Wo Contrast  Result Date: 04/07/2020  CLINICAL DATA:  Delirium.  Increased weakness and confusion EXAM: CT HEAD WITHOUT CONTRAST TECHNIQUE: Contiguous axial images were obtained from the base of the skull through the vertex without intravenous contrast. COMPARISON:  03/16/2020 FINDINGS: Brain: The heart size is normal. No substantial pericardial effusion. Diffuse loss of parenchymal volume is consistent with atrophy. Patchy low  attenuation in the deep hemispheric and periventricular white matter is nonspecific, but likely reflects chronic microvascular ischemic demyelination. Vascular: No hyperdense vessel or unexpected calcification. Skull: No evidence for fracture. No worrisome lytic or sclerotic lesion. Sinuses/Orbits: The visualized paranasal sinuses and mastoid air cells are clear. Visualized portions of the globes and intraorbital fat are unremarkable. Other: None. IMPRESSION: 1. No acute intracranial abnormality. 2. Atrophy with chronic small vessel white matter ischemic disease. Electronically Signed   By: Kennith Center M.D.   On: 04/07/2020 13:27   DG Chest Port 1 View  Result Date: 04/07/2020 CLINICAL DATA:  Possible sepsis. EXAM: PORTABLE CHEST 1 VIEW COMPARISON:  03/16/2020 FINDINGS: Heart size upper limits of normal. Chronic aortic atherosclerosis. Dual lead pacemaker with leads in the region of the right atrium and right ventricle. Pulmonary vascularity is normal. The lungs are clear. No infiltrate, collapse or effusion. No acute bone finding. IMPRESSION: No active disease. Pacemaker. Aortic atherosclerosis. Electronically Signed   By: Paulina Fusi M.D.   On: 04/07/2020 12:06     Labs:   Basic Metabolic Panel: Recent Labs  Lab 04/07/20 1107 04/08/20 0259 04/09/20 0134 04/11/20 0402  NA 136 137 139 139  K 4.0 4.0 3.8 4.0  CL 100 102 106 107  CO2 29 29 28 28   GLUCOSE 105* 115* 105* 90  BUN 19 14 18 13   CREATININE 1.39* 1.25* 1.30* 0.88  CALCIUM 8.9 9.0 8.4* 8.5*  MG  --  2.2  --   --    GFR CrCl cannot be calculated (Unknown ideal weight.). Liver Function Tests: Recent Labs  Lab 04/07/20 1107  AST 15  ALT 9  ALKPHOS 140*  BILITOT 0.5  PROT 6.8  ALBUMIN 3.6   No results for input(s): LIPASE, AMYLASE in the last 168 hours. No results for input(s): AMMONIA in the last 168 hours. Coagulation profile Recent Labs  Lab 04/07/20 1107  INR 1.2    CBC: Recent Labs  Lab 04/07/20 1107  04/08/20 0259 04/09/20 0134  WBC 6.8 8.6 5.7  NEUTROABS 5.4  --   --   HGB 12.9 12.9 11.1*  HCT 39.6 38.6 35.0*  MCV 94.5 92.6 94.6  PLT 219 192 170   Cardiac Enzymes: No results for input(s): CKTOTAL, CKMB, CKMBINDEX, TROPONINI in the last 168 hours. BNP: Invalid input(s): POCBNP CBG: No results for input(s): GLUCAP in the last 168 hours. D-Dimer No results for input(s): DDIMER in the last 72 hours. Hgb A1c No results for input(s): HGBA1C in the last 72 hours. Lipid Profile No results for input(s): CHOL, HDL, LDLCALC, TRIG, CHOLHDL, LDLDIRECT in the last 72 hours. Thyroid function studies No results for input(s): TSH, T4TOTAL, T3FREE, THYROIDAB in the last 72 hours.  Invalid input(s): FREET3 Anemia work up No results for input(s): VITAMINB12, FOLATE, FERRITIN, TIBC, IRON, RETICCTPCT in the last 72 hours. Microbiology Recent Results (from the past 240 hour(s))  Blood culture (routine x 2)     Status: None (Preliminary result)   Collection Time: 04/07/20 11:07 AM   Specimen: BLOOD  Result Value Ref Range Status   Specimen Description BLOOD RIGHT ANTECUBITAL  Final   Special Requests  Final    BOTTLES DRAWN AEROBIC AND ANAEROBIC Blood Culture results may not be optimal due to an inadequate volume of blood received in culture bottles   Culture   Final    NO GROWTH 3 DAYS Performed at [facility], [address], [place], [place] [address]    Report Status PENDING  Incomplete  Blood culture (routine x 2)     Status: None (Preliminary result)   Collection Time: 04/07/20 11:08 AM   Specimen: BLOOD  Result Value Ref Range Status   Specimen Description BLOOD LEFT ANTECUBITAL  Final   Special Requests Blood Culture adequate volume  Final   Culture   Final    NO GROWTH 3 DAYS Performed at [facility], [address], [place], [place] [address]    Report Status PENDING  Incomplete  Resp Panel by RT-PCR (Flu A&B, Covid) Nasopharyngeal Swab     Status: None    Collection Time: 04/07/20 11:21 AM   Specimen: Nasopharyngeal Swab; Nasopharyngeal(NP) swabs in vial transport medium  Result Value Ref Range Status   SARS Coronavirus 2 by RT PCR NEGATIVE NEGATIVE Final    Comment: (NOTE) SARS-CoV-2 target nucleic acids are NOT DETECTED.  The SARS-CoV-2 RNA is generally detectable in upper respiratory specimens during the acute phase of infection. The lowest concentration of SARS-CoV-2 viral copies this assay can detect is 138 copies/mL. A negative result does not preclude SARS-Cov-2 infection and should not be used as the sole basis for treatment or other patient management decisions. A negative result may occur with  improper specimen collection/handling, submission of specimen other than nasopharyngeal swab, presence of viral mutation(s) within the areas targeted by this assay, and inadequate number of viral copies(<138 copies/mL). A negative result must be combined with clinical observations, patient history, and epidemiological information. The expected result is Negative.  Fact Sheet for Patients:  BloggerCourse.com  Fact Sheet for Healthcare Providers:  SeriousBroker.it  This test is no t yet approved or cleared by the [place] FDA and  has been authorized for detection and/or diagnosis of SARS-CoV-2 by FDA under an Emergency Use Authorization (EUA). This EUA will remain  in effect (meaning this test can be used) for the duration of the COVID-19 declaration under Section 564(b)(1) of the Act, 21 U.S.C.section 360bbb-3(b)(1), unless the authorization is terminated  or revoked sooner.       Influenza A by PCR NEGATIVE NEGATIVE Final   Influenza B by PCR NEGATIVE NEGATIVE Final    Comment: (NOTE) The Xpert Xpress SARS-CoV-2/FLU/RSV plus assay is intended as an aid in the diagnosis of influenza from Nasopharyngeal swab specimens and should not be used as a sole basis for treatment.  Nasal washings and aspirates are unacceptable for Xpert Xpress SARS-CoV-2/FLU/RSV testing.  Fact Sheet for Patients: BloggerCourse.com  Fact Sheet for Healthcare Providers: SeriousBroker.it  This test is not yet approved or cleared by the [place] FDA and has been authorized for detection and/or diagnosis of SARS-CoV-2 by FDA under an Emergency Use Authorization (EUA). This EUA will remain in effect (meaning this test can be used) for the duration of the COVID-19 declaration under Section 564(b)(1) of the Act, 21 U.S.C. section 360bbb-3(b)(1), unless the authorization is terminated or revoked.  Performed at [facility], [address] [place], [place] [address]   Urine culture     Status: None   Collection Time: 04/07/20 11:21 AM   Specimen: In/Out Cath Urine  Result Value Ref Range Status  Specimen Description IN/OUT CATH URINE  Final   Special Requests NONE  Final   Culture   Final    NO GROWTH Performed at [facility], [address], [place], [place] [address]    Report Status 04/08/2020 FINAL  Final  MRSA PCR Screening     Status: None   Collection Time: 04/08/20  2:42 AM   Specimen: Nasopharyngeal  Result Value Ref Range Status   MRSA by PCR NEGATIVE NEGATIVE Final    Comment:        The GeneXpert MRSA Assay (FDA approved for NASAL specimens only), is one component of a comprehensive MRSA colonization surveillance program. It is not intended to diagnose MRSA infection nor to guide or monitor treatment for MRSA infections. Performed at [facility], [address], [place], [place] [address]   SARS CORONAVIRUS 2 (TAT 6-24 HRS) Nasopharyngeal Nasopharyngeal Swab     Status: None   Collection Time: 04/10/20  1:55 PM   Specimen: Nasopharyngeal Swab  Result Value Ref Range Status   SARS Coronavirus 2 NEGATIVE NEGATIVE Final    Comment: (NOTE) SARS-CoV-2 target nucleic acids are NOT  DETECTED.  The SARS-CoV-2 RNA is generally detectable in upper and lower respiratory specimens during the acute phase of infection. Negative results do not preclude SARS-CoV-2 infection, do not rule out co-infections with other pathogens, and should not be used as the sole basis for treatment or other patient management decisions. Negative results must be combined with clinical observations, patient history, and epidemiological information. The expected result is Negative.  Fact Sheet for Patients: HairSlick.no  Fact Sheet for Healthcare Providers: quierodirigir.com  This test is not yet approved or cleared by the [place] FDA and  has been authorized for detection and/or diagnosis of SARS-CoV-2 by FDA under an Emergency Use Authorization (EUA). This EUA will remain  in effect (meaning this test can be used) for the duration of the COVID-19 declaration under Se ction 564(b)(1) of the Act, 21 U.S.C. section 360bbb-3(b)(1), unless the authorization is terminated or revoked sooner.  Performed at [facility], [address], [place], [place] [address]      Discharge Instructions:   Discharge Instructions    Diet general   Complete by: As directed    Increase activity slowly   Complete by: As directed      Allergies as of 04/11/2020   No Known Allergies     Medication List    STOP taking these medications   ALPRAZolam 0.25 MG tablet Commonly known as: XANAX   spironolactone 25 MG tablet Commonly known as: ALDACTONE   sulfamethoxazole-trimethoprim 800-160 MG tablet Commonly known as: BACTRIM DS     TAKE these medications   acetaminophen 325 MG tablet Commonly known as: TYLENOL Take 650 mg by mouth 2 (two) times daily as needed (pain).   apixaban 2.5 MG Tabs tablet Commonly known as: ELIQUIS Take 2.5 mg by mouth 2 (two) times daily.   aspirin 81 MG chewable tablet Chew 81 mg by mouth daily.    Biotin 19379 MCG Tabs Take 10,000 mcg by mouth daily.   calcium carbonate 500 MG chewable tablet Commonly known as: Tums Chew 1 tablet (200 mg of elemental calcium total) by mouth 2 (two) times daily as needed for indigestion or heartburn.   diphenoxylate-atropine 2.5-0.025 MG tablet Commonly known as: LOMOTIL Take 1 tablet by mouth every 6 (six) hours as needed for diarrhea or loose stools.   DULoxetine 60 MG capsule  Commonly known as: CYMBALTA Take 1 capsule (60 mg total) by mouth daily.   esomeprazole 20 MG capsule Commonly known as: NEXIUM Take 1 capsule (20 mg total) by mouth daily at 12 noon.   famotidine 20 MG tablet Commonly known as: PEPCID Take 20 mg by mouth daily at 6 (six) AM.   guaiFENesin 100 MG/5ML liquid Commonly known as: ROBITUSSIN Take 200 mg by mouth every 4 (four) hours as needed for cough.   lipase/protease/amylase 93818 UNITS Cpep capsule Commonly known as: Creon Take 2 capsules with each meal and 1 capsule with each snack What changed:   how much to take  how to take this  when to take this  additional instructions   loratadine 10 MG tablet Commonly known as: CLARITIN Take 10 mg by mouth daily.   metoprolol tartrate 25 MG tablet Commonly known as: LOPRESSOR TAKE 1 TABLET BY MOUTH TWICE A DAY What changed: additional instructions   NON FORMULARY Apply 1 each topically daily at 12 noon. Jobst Hose KN XL   nystatin powder Commonly known as: MYCOSTATIN/NYSTOP Apply 1 application topically 3 (three) times daily. Under both breast   ondansetron 4 MG tablet Commonly known as: ZOFRAN Take 4 mg by mouth every 4 (four) hours as needed for nausea or vomiting.   polyvinyl alcohol 1.4 % ophthalmic solution Commonly known as: LIQUIFILM TEARS Place 1 drop into both eyes 4 (four) times daily as needed for dry eyes.   pregabalin 75 MG capsule Commonly known as: LYRICA Take 1 capsule (75 mg total) by mouth 3 (three) times daily.   PROBIOTIC  PO Take 1 capsule by mouth daily.   QUEtiapine 50 MG tablet Commonly known as: SEROQUEL Take 75 mg by mouth at bedtime. What changed: Another medication with the same name was removed. Continue taking this medication, and follow the directions you see here.   torsemide 10 MG tablet Commonly known as: DEMADEX Take 1 tablet (10 mg total) by mouth daily as needed. What changed:   when to take this  reasons to take this   traZODone 50 MG tablet Commonly known as: DESYREL TAKE 1 TABLET (50 MG TOTAL) BY MOUTH AT BEDTIME AS NEEDED FOR SLEEP.       Follow-up Information    Etta Grandchild, MD Follow up in 1 week(s).   Specialty: Internal Medicine Contact information: [address] [place] [address] [phone]                Time coordinating discharge: 35 min  Signed:  Joseph Art DO  [facility] 04/11/2020, 8:06 AM

## 2020-04-11 NOTE — Progress Notes (Signed)
Physical Therapy Treatment Patient Details Name: Deborah Chung MRN: [id number] DOB: January 01, 1927 Today's Date: 04/11/2020    History of Present Illness Pt is a 87 yo female admitted 04/07/20 with increased weakness and confusion with hallucinations.  Pt diagnosed with UTI prior to admission on 04/05/20 and had recieved 2 days of antibiotics before this admission.  Further work-up reveals AMS due to UTI ( no urinary symptoms at this time likely due to antibitoics already being on board), acute cystitis without hematuria, and progressive dementia with psychosis.   Pt is from [employer] and prone to falls and frequent admissions.  PMH: Afib, HTN, systolic CHF with pacemaker, stage 3 kidney disease, arthritis, depression with GAD, hearing loss, hyperlipdemia, insomnia, OA, PHN, and dementia.    PT Comments    Pt supine in bed on arrival.  She is pleasantly confused but able to follow commands and participate more in PT session.  PTA utilized sara stedy to stand and she did quite well. Pt able to stand with min +1 assistance in lift device but would likely require increased assistance to stand with RW.  Continue to recommend snf placement at this time.     SNF     Equipment Recommendations  None recommended by PT    Recommendations for Other Services       Precautions / Restrictions Precautions Precautions: Fall Precaution Comments: family states increasing falls over the past few months Restrictions Weight Bearing Restrictions: No    Mobility  Bed Mobility Overal bed mobility: Needs Assistance Bed Mobility: Supine to Sit     Supine to sit: Mod assist     General bed mobility comments: Mod assistance to advance LEs to edge of bed.  Pt presents with posterior bias intially with cues to correct and use of bed pad to scoot to edge of bed her balance improves.    Transfers Overall transfer level: Needs assistance Equipment used: Ambulation equipment used (sara stedy) Transfers: Sit  to/from Stand Sit to Stand: Min assist         General transfer comment: Cues for hand placement and forward weight shifting, able to move into standing x 3 trials.  Will likely require increased assistance to stand with RW.  Ambulation/Gait Ambulation/Gait assistance:  (NT)               Stairs             Wheelchair Mobility    Modified Rankin (Stroke Patients Only)       Balance Overall balance assessment: Needs assistance Sitting-balance support: Feet unsupported Sitting balance-Leahy Scale: Poor Sitting balance - Comments: Poor but progressed to fair balance. Postural control: Posterior lean   Standing balance-Leahy Scale: Poor                              Cognition Arousal/Alertness: Awake/alert Behavior During Therapy: Impulsive Overall Cognitive Status: Impaired/Different from baseline Area of Impairment: Orientation;Memory;Safety/judgement;Awareness;Problem solving;Following commands                 Orientation Level: Disoriented to;Place;Time;Situation   Memory: Decreased short-term memory Following Commands: Follows one step commands inconsistently;Follows one step commands with increased time Safety/Judgement: Decreased awareness of safety;Decreased awareness of deficits Awareness: Intellectual Problem Solving: Slow processing;Requires verbal cues;Requires tactile cues;Decreased initiation General Comments: patient has dementia at baseline,  but family states increased word finding and forgetfulness over the last few months.      Exercises  General Comments        Pertinent Vitals/Pain Pain Assessment: Faces Faces Pain Scale: Hurts little more Pain Location: L LE to touch. Pain Descriptors / Indicators: Grimacing;Guarding;Moaning;Sore Pain Intervention(s): Monitored during session;Repositioned    Home Living                      Prior Function            PT Goals (current goals can now be found  in the care plan section) Acute Rehab PT Goals Patient Stated Goal: Family states she has to perform basic transfers with one assist for ALF Time For Goal Achievement: 04/15/20 Progress towards PT goals: Progressing toward goals    Frequency    Min 2X/week      PT Plan Current plan remains appropriate    Co-evaluation              AM-PAC PT "6 Clicks" Mobility   Outcome Measure  Help needed turning from your back to your side while in a flat bed without using bedrails?: A Lot Help needed moving from lying on your back to sitting on the side of a flat bed without using bedrails?: A Lot Help needed moving to and from a bed to a chair (including a wheelchair)?: A Little Help needed standing up from a chair using your arms (e.g., wheelchair or bedside chair)?: A Lot Help needed to walk in hospital room?: Total Help needed climbing 3-5 steps with a railing? : Total 6 Click Score: 11    End of Session Equipment Utilized During Treatment: Gait belt Activity Tolerance: Patient tolerated treatment well;Other (comment) (limited by confusion) Patient left: in chair;with call bell/phone within reach;with chair alarm set Nurse Communication: Mobility status PT Visit Diagnosis: Muscle weakness (generalized) (M62.81);Difficulty in walking, not elsewhere classified (R26.2);Other abnormalities of gait and mobility (R26.89);Pain Pain - Right/Left:  (B LE) Pain - part of body: Leg     Time: 1517-6160 PT Time Calculation (min) (ACUTE ONLY): 32 min  Charges:  $Therapeutic Activity: 23-37 mins                     Bonney Leitz , PTA Acute Rehabilitation Services Pager [phone] Office [phone]     Ulysees Robarts Artis Delay 04/11/2020, 12:40 PM

## 2020-04-11 NOTE — Care Management Important Message (Signed)
Important Message  Patient Details  Name: Deborah Chung MRN: [id number] Date of Birth: [birth year]   Medicare Important Message Given:  Yes     Arasely Akkerman P Carlisa Eble 04/11/2020, 12:21 PM

## 2020-04-12 ENCOUNTER — Non-Acute Institutional Stay (SKILLED_NURSING_FACILITY): Payer: Medicare PPO | Admitting: Adult Health

## 2020-04-12 ENCOUNTER — Encounter: Payer: Self-pay | Admitting: Adult Health

## 2020-04-12 DIAGNOSIS — I129 Hypertensive chronic kidney disease with stage 1 through stage 4 chronic kidney disease, or unspecified chronic kidney disease: Secondary | ICD-10-CM | POA: Diagnosis not present

## 2020-04-12 DIAGNOSIS — N3000 Acute cystitis without hematuria: Secondary | ICD-10-CM

## 2020-04-12 DIAGNOSIS — I480 Paroxysmal atrial fibrillation: Secondary | ICD-10-CM

## 2020-04-12 DIAGNOSIS — N183 Chronic kidney disease, stage 3 unspecified: Secondary | ICD-10-CM

## 2020-04-12 DIAGNOSIS — I5032 Chronic diastolic (congestive) heart failure: Secondary | ICD-10-CM | POA: Diagnosis not present

## 2020-04-12 DIAGNOSIS — F0391 Unspecified dementia with behavioral disturbance: Secondary | ICD-10-CM

## 2020-04-12 LAB — CULTURE, BLOOD (ROUTINE X 2)
Culture: NO GROWTH
Culture: NO GROWTH
Special Requests: ADEQUATE

## 2020-04-12 NOTE — Progress Notes (Signed)
Location:  [facility] Room Number: 115 Place of Service:  SNF (31) Provider:  Kenard Gower, DNP, FNP-BC  Patient Care Team: Etta Grandchild, MD as PCP - General (Internal Medicine) Etta Grandchild, MD (Internal Medicine) Etta Grandchild, MD (Internal Medicine)  Extended Emergency Contact Information Primary Emergency Contact: [name] [place] Home Phone: [phone] Relation: Son Secondary Emergency Contact: Latino,Anne Mobile Phone: [phone] Relation: Other  Code Status:  DNR  Goals of care: Advanced Directive information Advanced Directives 04/07/2020  Does Patient Have a Medical Advance Directive? No  Type of Advance Directive -  Does patient want to make changes to medical advance directive? No - Patient declined  Copy of Healthcare Power of Attorney in Chart? -  Would patient like information on creating a medical advance directive? -     Chief Complaint  Patient presents with  . Acute Visit    Hospital Follow Up    HPI:  Pt is a 87 y.o. female who was admitted to [facility] on 04/11/2020 post hospitalization 04/07/2020 to 04/11/2020.  She has a PMH of chronic systolic CHF, pacemaker placement, hypertension, hyperlipidemia, anxiety/depression and atrial fibrillation.  She presented to the hospital with weakness, confusion and visual hallucinations.  She was a started on Bactrim for UTI a day prior to hospitalization.  She is prone to falls and has been hospitalized periodically as a result of the falls.  On day of hospitalization, she was unable to get out of bed with assistance she was noted to be groggy and confused more than the usual.  Blood and urine cultures were negative.  She was treated with IV Rocephin for possible UTI.  Demadex and Aldactone for her chronic diastolic CHF were held.  Echocardiogram done on 3/27 with EF of 60 to 65%.  She was seen in her room today. She was wanting  to get up and noted to be sitting on the edge of her wheelchair. She is confused to time and place.  Past Medical History:  Diagnosis Date  . Arthritis   . Atrial fibrillation (HCC)   . Blood transfusion without reported diagnosis   . Depression   . GAD (generalized anxiety disorder)   . Hearing loss   . Hyperlipidemia   . Hypertension   . Insomnia   . Osteoarthritis   . Pacemaker   . PHN (postherpetic neuralgia) 2010  . Systolic CHF [facility])    Past Surgical History:  Procedure Laterality Date  . ABDOMINAL HYSTERECTOMY    . PACEMAKER PLACEMENT    . TOTAL KNEE ARTHROPLASTY Bilateral     No Known Allergies  Outpatient Encounter Medications as of 04/12/2020  Medication Sig  . acetaminophen (TYLENOL) 325 MG tablet Take 650 mg by mouth 2 (two) times daily as needed (pain).  Marland Kitchen apixaban (ELIQUIS) 2.5 MG TABS tablet Take 2.5 mg by mouth 2 (two) times daily.  Marland Kitchen aspirin 81 MG chewable tablet Chew 81 mg by mouth daily.  . Biotin 21308 MCG TABS Take 10,000 mcg by mouth daily.  . bisacodyl (DULCOLAX) 10 MG suppository Place 10 mg rectally as needed for moderate constipation.  . calcium carbonate (TUMS) 500 MG chewable tablet Chew 1 tablet (200 mg of elemental calcium total) by mouth 2 (two) times daily as needed for indigestion or heartburn.  . DULoxetine (CYMBALTA) 60 MG capsule Take 1 capsule (60 mg total) by mouth daily.  Marland Kitchen esomeprazole (NEXIUM) 20 MG capsule Take 1 capsule (20 mg  total) by mouth daily at 12 noon.  . famotidine (PEPCID) 20 MG tablet Take 20 mg by mouth daily at 6 (six) AM.  . guaiFENesin (ROBITUSSIN) 100 MG/5ML liquid Take 200 mg by mouth every 4 (four) hours as needed for cough.  . lipase/protease/amylase (CREON) 36000 UNITS CPEP capsule Take by mouth. One daily with each snack  . lipase/protease/amylase (CREON) 36000 UNITS CPEP capsule Take by mouth. Give two caps with each meal for pancreatic insuff.  . loratadine (CLARITIN) 10 MG tablet Take 10 mg by mouth daily.  .  magnesium hydroxide (MILK OF MAGNESIA) 400 MG/5ML suspension Take by mouth daily as needed for mild constipation.  . metoprolol tartrate (LOPRESSOR) 25 MG tablet TAKE 1 TABLET BY MOUTH TWICE A DAY (Patient taking differently: Take 25 mg by mouth 2 (two) times daily. (BETA BLOCKER))  . NON FORMULARY Apply 1 each topically daily at 12 noon. Jobst Hose KN XL  . nystatin (MYCOSTATIN/NYSTOP) powder Apply 1 application topically 3 (three) times daily. Under both breast  . ondansetron (ZOFRAN) 4 MG tablet Take 4 mg by mouth every 4 (four) hours as needed for nausea or vomiting.  . polyvinyl alcohol (LIQUIFILM TEARS) 1.4 % ophthalmic solution Place 1 drop into both eyes 4 (four) times daily as needed for dry eyes.  . pregabalin (LYRICA) 75 MG capsule Take 1 capsule (75 mg total) by mouth 3 (three) times daily.  . Probiotic Product (PROBIOTIC PO) Take 1 capsule by mouth daily.  . QUEtiapine (SEROQUEL) 50 MG tablet Take 75 mg by mouth at bedtime.  . torsemide (DEMADEX) 10 MG tablet Take 1 tablet (10 mg total) by mouth daily as needed.  . traZODone (DESYREL) 50 MG tablet TAKE 1 TABLET (50 MG TOTAL) BY MOUTH AT BEDTIME AS NEEDED FOR SLEEP. (Patient taking differently: Take 50 mg by mouth at bedtime as needed for sleep.)  . [DISCONTINUED] lipase/protease/amylase (CREON) 36000 UNITS CPEP capsule Take 2 capsules with each meal and 1 capsule with each snack (Patient taking differently: Take 72,000 Units by mouth 3 (three) times daily before meals.)  . [DISCONTINUED] diphenoxylate-atropine (LOMOTIL) 2.5-0.025 MG tablet Take 1 tablet by mouth every 6 (six) hours as needed for diarrhea or loose stools.   No facility-administered encounter medications on file as of 04/12/2020.    Review of Systems  Unable to obtain due to dementia.    Immunization History  Administered Date(s) Administered  . Influenza, High Dose Seasonal PF 09/11/2015, 10/15/2016  . Influenza-Unspecified 10/22/2017, 10/08/2018, 09/22/2019  .  Moderna Sars-Covid-2 Vaccination 03/15/2019, 04/07/2019  . Pneumococcal Conjugate-13 06/29/2013  . Pneumococcal Polysaccharide-23 10/20/2008, 11/01/2019  . Tdap 06/29/2013   Pertinent  Health Maintenance Due  Topic Date Due  . INFLUENZA VACCINE  Completed  . DEXA SCAN  Completed  . PNA vac Low Risk Adult  Completed   Fall Risk  04/21/2019 12/24/2017 06/24/2017 05/31/2015 05/30/2015  Falls in the past year? 0 0 No No No  Number falls in past yr: 0 0 - - -  Injury with Fall? 0 0 - - -  Risk for fall due to : Impaired balance/gait;Impaired mobility Impaired mobility;Impaired balance/gait - - -  Follow up Falls evaluation completed;Education provided - - - -     Vitals:   04/12/20 1024  BP: (!) 154/72  Pulse: 69  Resp: 18  Temp: 98.1 F (36.7 C)  Weight: 199 lb (90.3 kg)  Height: 5\' 4"  (1.626 m)   Body mass index is 34.16 kg/m.  Physical Exam  GENERAL  APPEARANCE: Well nourished. In no acute distress. Obese. SKIN:  Skin is warm and dry.  MOUTH and THROAT: Lips are without lesions. Oral mucosa is moist and without lesions.  RESPIRATORY: Breathing is even & unlabored, BS CTAB CARDIAC: RRR, no murmur,no extra heart sounds, BLE 2+ edema GI: Abdomen soft, normal BS, no masses, no tenderness EXTREMITIES: Able to move x4 extremities NEUROLOGICAL: There is no tremor. Speech is clear.  Alert to self, disoriented to time and place. PSYCHIATRIC:  Affect and behavior are appropriate  Labs reviewed: Recent Labs    04/08/20 0259 04/09/20 0134 04/11/20 0402  NA 137 139 139  K 4.0 3.8 4.0  CL 102 106 107  CO2 29 28 28   GLUCOSE 115* 105* 90  BUN 14 18 13   CREATININE 1.25* 1.30* 0.88  CALCIUM 9.0 8.4* 8.5*  MG 2.2  --   --    Recent Labs    01/08/20 1709 03/16/20 1553 04/07/20 1107  AST 17 13* 15  ALT 11 9 9   ALKPHOS 140* 143* 140*  BILITOT 0.7 0.7 0.5  PROT 6.5 6.6 6.8  ALBUMIN 3.5 3.4* 3.6   Recent Labs    04/28/19 1535 01/08/20 1709 03/16/20 1553 04/07/20 1107  04/08/20 0259 04/09/20 0134  WBC 6.2 7.0   < > 6.8 8.6 5.7  NEUTROABS 3.3 3.9  --  5.4  --   --   HGB 13.2 12.2   < > 12.9 12.9 11.1*  HCT 41.7 38.8   < > 39.6 38.6 35.0*  MCV 100.0 97.0   < > 94.5 92.6 94.6  PLT 186 211   < > 219 192 170   < > = values in this interval not displayed.   Lab Results  Component Value Date   TSH 1.45 04/21/2019   Lab Results  Component Value Date   HGBA1C 5.6 04/21/2019   Lab Results  Component Value Date   CHOL 192 04/21/2019   HDL 43.40 04/21/2019   LDLCALC 109 (H) 04/21/2019   LDLDIRECT 106.0 05/16/2014   TRIG 198.0 (H) 04/21/2019   CHOLHDL 4 04/21/2019    Significant Diagnostic Results in last 30 days:  CT Head Wo Contrast  Result Date: 04/07/2020 CLINICAL DATA:  Delirium.  Increased weakness and confusion EXAM: CT HEAD WITHOUT CONTRAST TECHNIQUE: Contiguous axial images were obtained from the base of the skull through the vertex without intravenous contrast. COMPARISON:  03/16/2020 FINDINGS: Brain: The heart size is normal. No substantial pericardial effusion. Diffuse loss of parenchymal volume is consistent with atrophy. Patchy low attenuation in the deep hemispheric and periventricular white matter is nonspecific, but likely reflects chronic microvascular ischemic demyelination. Vascular: No hyperdense vessel or unexpected calcification. Skull: No evidence for fracture. No worrisome lytic or sclerotic lesion. Sinuses/Orbits: The visualized paranasal sinuses and mastoid air cells are clear. Visualized portions of the globes and intraorbital fat are unremarkable. Other: None. IMPRESSION: 1. No acute intracranial abnormality. 2. Atrophy with chronic small vessel white matter ischemic disease. Electronically Signed   By: Kennith Center M.D.   On: 04/07/2020 13:27   CT Head Wo Contrast  Result Date: 03/16/2020 CLINICAL DATA:  Head trauma EXAM: CT HEAD WITHOUT CONTRAST CT CERVICAL SPINE WITHOUT CONTRAST TECHNIQUE: Multidetector CT imaging of the head  and cervical spine was performed following the standard protocol without intravenous contrast. Multiplanar CT image reconstructions of the cervical spine were also generated. COMPARISON:  CT head 01/08/2020 FINDINGS: CT HEAD FINDINGS Brain: Moderate atrophy without hydrocephalus. Moderate white matter changes with  diffuse white matter hypodensity, similar to the prior study. Negative for acute infarct, intracranial hemorrhage, mass. Vascular: Negative for hyperdense vessel Skull: Negative for skull fracture. Moderately large left parietal scalp hematoma. Sinuses/Orbits: Mild mucosal edema right maxillary sinus otherwise clear sinuses. No orbital abnormality. Bilateral cataract extraction Other: None CT CERVICAL SPINE FINDINGS Alignment: Anterolisthesis C2-3, C3-4, C4-5. Straightening of the cervical lordosis. Skull base and vertebrae: Negative for fracture. Soft tissues and spinal canal: No acute soft tissue abnormality in the neck. Disc levels: Multilevel disc and facet degeneration. Foraminal narrowing bilaterally at C5-6 due to spurring. Mild spinal stenosis and foraminal stenosis bilaterally at C6-7. Upper chest: Apical scarring bilaterally.  No acute abnormality. Other: None IMPRESSION: 1. No acute intracranial abnormality.  Left parietal scalp hematoma 2. Cervical spine degenerative change.  Negative for fracture. Electronically Signed   By: Marlan Palau M.D.   On: 03/16/2020 18:09   CT Cervical Spine Wo Contrast  Result Date: 03/16/2020 CLINICAL DATA:  Head trauma EXAM: CT HEAD WITHOUT CONTRAST CT CERVICAL SPINE WITHOUT CONTRAST TECHNIQUE: Multidetector CT imaging of the head and cervical spine was performed following the standard protocol without intravenous contrast. Multiplanar CT image reconstructions of the cervical spine were also generated. COMPARISON:  CT head 01/08/2020 FINDINGS: CT HEAD FINDINGS Brain: Moderate atrophy without hydrocephalus. Moderate white matter changes with diffuse white  matter hypodensity, similar to the prior study. Negative for acute infarct, intracranial hemorrhage, mass. Vascular: Negative for hyperdense vessel Skull: Negative for skull fracture. Moderately large left parietal scalp hematoma. Sinuses/Orbits: Mild mucosal edema right maxillary sinus otherwise clear sinuses. No orbital abnormality. Bilateral cataract extraction Other: None CT CERVICAL SPINE FINDINGS Alignment: Anterolisthesis C2-3, C3-4, C4-5. Straightening of the cervical lordosis. Skull base and vertebrae: Negative for fracture. Soft tissues and spinal canal: No acute soft tissue abnormality in the neck. Disc levels: Multilevel disc and facet degeneration. Foraminal narrowing bilaterally at C5-6 due to spurring. Mild spinal stenosis and foraminal stenosis bilaterally at C6-7. Upper chest: Apical scarring bilaterally.  No acute abnormality. Other: None IMPRESSION: 1. No acute intracranial abnormality.  Left parietal scalp hematoma 2. Cervical spine degenerative change.  Negative for fracture. Electronically Signed   By: Marlan Palau M.D.   On: 03/16/2020 18:09   DG Chest Port 1 View  Result Date: 04/07/2020 CLINICAL DATA:  Possible sepsis. EXAM: PORTABLE CHEST 1 VIEW COMPARISON:  03/16/2020 FINDINGS: Heart size upper limits of normal. Chronic aortic atherosclerosis. Dual lead pacemaker with leads in the region of the right atrium and right ventricle. Pulmonary vascularity is normal. The lungs are clear. No infiltrate, collapse or effusion. No acute bone finding. IMPRESSION: No active disease. Pacemaker. Aortic atherosclerosis. Electronically Signed   By: Paulina Fusi M.D.   On: 04/07/2020 12:06   DG Chest Port 1 View  Result Date: 03/16/2020 CLINICAL DATA:  Larey Seat.  Taking blood thinners. EXAM: PORTABLE CHEST 1 VIEW COMPARISON:  None. FINDINGS: Heart size near the upper limit of normal. Left subclavian bipolar pacemaker leads in satisfactory position. Clear lungs. No fracture or pneumothorax seen.  IMPRESSION: No acute abnormality. Electronically Signed   By: Beckie Salts M.D.   On: 03/16/2020 16:18   ECHOCARDIOGRAM LIMITED  Result Date: 04/09/2020    ECHOCARDIOGRAM LIMITED REPORT   Patient Name:   ICIE KUZNICKI Date of Exam: 04/09/2020 Medical Rec #:  [id number]     Height:       64.0 in Accession #:    [id number]    Weight:       185.0  lb Date of Birth:  16-Jul-1926      BSA:          1.893 m Patient Age:    89 years      BP:           101/83 mmHg Patient Gender: F             HR:           92 bpm. Exam Location:  Inpatient Procedure: Limited Echo, Limited Color Doppler and Cardiac Doppler Indications:    atrial fibrillation  History:        Patient has prior history of Echocardiogram examinations, most                 recent 11/26/2019. Abnormal ECG and Pacemaker, chronic kidney                 disease, Arrythmias:Atrial Fibrillation; Risk                 Factors:Hypertension and Dyslipidemia.  Sonographer:    Delcie Roch Referring Phys: [id number] JESSICA U VANN IMPRESSIONS  1. Left ventricular ejection fraction, by estimation, is 60 to 65%. The left ventricle has normal function. The left ventricle has no regional wall motion abnormalities. Left ventricular diastolic parameters are indeterminate.  2. Right ventricular systolic function is normal. The right ventricular size is normal.  3. Left atrial size was mildly dilated.  4. The mitral valve is normal in structure. Trivial mitral valve regurgitation. No evidence of mitral stenosis.  5. The aortic valve is tricuspid. Aortic valve regurgitation is not visualized. Mild aortic valve sclerosis is present, with no evidence of aortic valve stenosis.  6. The inferior vena cava is normal in size with greater than 50% respiratory variability, suggesting right atrial pressure of 3 mmHg. FINDINGS  Left Ventricle: Left ventricular ejection fraction, by estimation, is 60 to 65%. The left ventricle has normal function. The left ventricle has no regional wall motion  abnormalities. The left ventricular internal cavity size was normal in size. There is  no left ventricular hypertrophy. Left ventricular diastolic parameters are indeterminate. Right Ventricle: The right ventricular size is normal.Right ventricular systolic function is normal. Left Atrium: Left atrial size was mildly dilated. Right Atrium: Right atrial size was normal in size. Pericardium: There is no evidence of pericardial effusion. Mitral Valve: The mitral valve is normal in structure. Trivial mitral valve regurgitation. No evidence of mitral valve stenosis. Tricuspid Valve: The tricuspid valve is normal in structure. Tricuspid valve regurgitation is trivial. No evidence of tricuspid stenosis. Aortic Valve: The aortic valve is tricuspid. Aortic valve regurgitation is not visualized. Mild aortic valve sclerosis is present, with no evidence of aortic valve stenosis. Pulmonic Valve: The pulmonic valve was normal in structure. Pulmonic valve regurgitation is not visualized. No evidence of pulmonic stenosis. Aorta: The aortic root is normal in size and structure. Venous: The inferior vena cava is normal in size with greater than 50% respiratory variability, suggesting right atrial pressure of 3 mmHg.  Additional Comments: A device lead is visualized. IVC IVC diam: 1.30 cm  AORTA Ao Asc diam: 3.10 cm Olga Millers MD Electronically signed by Olga Millers MD Signature Date/Time: 04/09/2020/12:12:04 PM    Final     Assessment/Plan  1. Acute cystitis without hematuria -   Was started on Bactrim at her ALF facility 2 days prior to hospitalization -   Blood and urine cultures were negative -   She was treated with IV Rocephin  with improvement in her confusion and weakness  2. Dementia with behavioral disturbance, unspecified dementia type (HCC) -   BIMS score 2/15 -   Continue Seroquel  -    Fall precautions  3. Chronic diastolic CHF (congestive heart failure) (HCC) -04/09/2020 echocardiogram EF 60 to  65% -Demadex and Aldactone held -Plan is to start Demadex PRN and schedule along low-dose Aldactone if eating better or showing signs of volume overload -Bilateral knee-high TED hose when out of bed and off at night  4. Benign hypertension with chronic kidney disease, stage III (HCC) -   Continue metoprolol tartrate -    Monitor for BPs  5. Paroxysmal atrial fibrillation (HCC) -Rate controlled, continue Eliquis for anticoagulation and metoprolol tartrate for rate control      Family/ staff Communication: Discussed plan of care with charge nurse.  Labs/tests ordered: None  Goals of care:   Short-term care   Kenard GowerMonina Medina-Vargas, DNP, MSN, FNP-BC [facility] [phone] (Monday-Friday 8:00 a.m. - 5:00 p.m.) [phone] (after hours)

## 2020-04-14 LAB — COMPREHENSIVE METABOLIC PANEL: Calcium: 9.1 (ref 8.7–10.7)

## 2020-04-14 LAB — BASIC METABOLIC PANEL
BUN: 14 (ref 4–21)
CO2: 26 — AB (ref 13–22)
Chloride: 106 (ref 99–108)
Creatinine: 1 (ref 0.5–1.1)
Glucose: 92
Potassium: 4.3 (ref 3.4–5.3)
Sodium: 141 (ref 137–147)

## 2020-04-14 LAB — CBC AND DIFFERENTIAL
HCT: 43 (ref 36–46)
Hemoglobin: 14.6 (ref 12.0–16.0)
Platelets: 298 (ref 150–399)

## 2020-04-14 LAB — CBC: RBC: 4.8 (ref 3.87–5.11)

## 2020-04-17 ENCOUNTER — Encounter: Payer: Self-pay | Admitting: Internal Medicine

## 2020-04-17 ENCOUNTER — Non-Acute Institutional Stay (SKILLED_NURSING_FACILITY): Payer: Medicare PPO | Admitting: Internal Medicine

## 2020-04-17 DIAGNOSIS — N3000 Acute cystitis without hematuria: Secondary | ICD-10-CM | POA: Diagnosis not present

## 2020-04-17 DIAGNOSIS — I4891 Unspecified atrial fibrillation: Secondary | ICD-10-CM | POA: Diagnosis not present

## 2020-04-17 DIAGNOSIS — N1832 Chronic kidney disease, stage 3b: Secondary | ICD-10-CM | POA: Diagnosis not present

## 2020-04-17 DIAGNOSIS — F01518 Vascular dementia, unspecified severity, with other behavioral disturbance: Secondary | ICD-10-CM

## 2020-04-17 DIAGNOSIS — F0151 Vascular dementia with behavioral disturbance: Secondary | ICD-10-CM

## 2020-04-17 DIAGNOSIS — R410 Disorientation, unspecified: Secondary | ICD-10-CM | POA: Diagnosis not present

## 2020-04-17 NOTE — Assessment & Plan Note (Addendum)
04/14/2020 creatinine 0.95 and GFR 57. Spironolactone will be continued to be held.

## 2020-04-17 NOTE — Assessment & Plan Note (Signed)
No indication for continued antibiotics.

## 2020-04-17 NOTE — Patient Instructions (Signed)
See assessment and plan under each diagnosis in the problem list and acutely for this visit 

## 2020-04-17 NOTE — Assessment & Plan Note (Addendum)
In & out cath urine culture while hospitalized was negative.  Follow-up culture and sensitivity 04/14/2020 at the SNF was also negative.

## 2020-04-17 NOTE — Assessment & Plan Note (Addendum)
Today clinically the rate is rapid (135) and irregular. Non pitting edema is present. The BNP was mildly elevated at 239.  Demadex will be changed to low-dose every day rather than as needed.  The beta-blocker will be continued @ present dose & rate limiting CCB initiated.

## 2020-04-17 NOTE — Progress Notes (Signed)
   NURSING HOME LOCATION:  [facility] ROOM NUMBER:  [id number]  CODE STATUS:  DNR  PCP:  Etta Grandchild MD  This is a comprehensive admission note to this SNFperformed on this date less than 30 days from date of admission. Included are preadmission medical/surgical history; reconciled medication list; family history; social history and comprehensive review of systems.  Corrections and additions to the records were documented. Comprehensive physical exam was also performed. Additionally a clinical summary was entered for each active diagnosis pertinent to this admission in the Problem List to enhance continuity of care.  HPI: Patient was hospitalized 3/25-3/29/2022 with weakness, confusion, and visual hallucinations.  Apparently she is chronically "groggy and confused"; but this exacerbated PTA. Acute cystitis without hematuria apparently was documented and Bactrim initiated at the facility.  In the ED Rocephin was initiated empirically.  Subsequently blood and urine cultures returned revealing no growth. There was some improvement in her dementia with psychosis.  Cymbalta, Seroquel, trazodone, and Xanax were continued.  Past medical and surgical history: Includes history of PAF, essential hypertension, stage IIIb CKD, GAD, history of depression, dyslipidemia and chronic diastolic congestive heart failure. Surgeries and procedures include pacemaker placement, TKA bilaterally, and abdominal hysterectomy..  Social history: Currently nondrinker; never smoked.  Family history: Noncontributory due to advanced age.   Review of systems: She can provide no meaningful history.  She rambles nonsensically and and responses to queries are totally unrelated to the question.  She kept confabulating about a car in the area, concerned to whom it belonged.  Physical exam:  Pertinent or positive findings: Facies are blank.  Mandibular teeth are malaligned and meet in an anterior V.   Tachyarrhythmias present suggesting A. fib with a rapid rate.  Breath sounds are decreased.  Abdomen is protuberant.  Pedal pulses are decreased.  She has nonpitting edema.  There are mixed PIP & DIP changes.  General appearance:  no acute distress, increased work of breathing is present.   Lymphatic: No lymphadenopathy about the head, neck, axilla. Eyes: No conjunctival inflammation or lid edema is present. There is no scleral icterus. Ears:  External ear exam shows no significant lesions or deformities.   Nose:  External nasal examination shows no deformity or inflammation. Nasal mucosa are pink and moist without lesions, exudates Oral exam: Lips and gums are healthy appearing.There is no oropharyngeal erythema or exudate. Neck:  No thyromegaly, masses, tenderness noted.    Heart:  No murmur, click, rub.  Lungs:  without wheezes, rhonchi, rales, rubs. Abdomen: Bowel sounds are normal.  Abdomen is soft and nontender with no organomegaly, hernias, masses. GU: Deferred  Extremities:  No cyanosis, clubbing. Neurologic exam: Balance, Rhomberg, finger to nose testing could not be completed due to clinical state Skin: Warm & dry w/o tenting. No significant lesions or rash.  See clinical summary under each active problem in the Problem List with associated updated therapeutic plan

## 2020-04-18 ENCOUNTER — Other Ambulatory Visit: Payer: Self-pay

## 2020-04-18 ENCOUNTER — Non-Acute Institutional Stay: Payer: Self-pay | Admitting: Hospice

## 2020-04-18 DIAGNOSIS — F0391 Unspecified dementia with behavioral disturbance: Secondary | ICD-10-CM

## 2020-04-18 DIAGNOSIS — Z515 Encounter for palliative care: Secondary | ICD-10-CM

## 2020-04-18 DIAGNOSIS — R410 Disorientation, unspecified: Secondary | ICD-10-CM

## 2020-04-18 LAB — TSH: TSH: 1.29 (ref 0.41–5.90)

## 2020-04-18 NOTE — Progress Notes (Signed)
Therapist, nutritional Palliative Care Consult Note Telephone: [phone]  Fax: [phone]  PATIENT NAME: Deborah Chung [address] [place] [address] [phone] (home)  DOB: 05/19/1926 MRN: [id number]  PRIMARY CARE PROVIDER:    Etta Grandchild, MD,  [address] [place] [address] [phone]  REFERRING PROVIDER:   Dr. Algis Downs. Hopper  RESPONSIBLE PARTY:   Extended Emergency Contact Information Primary Emergency Contact: [name] [place] Home Phone: [phone] Relation: Son Secondary Emergency Contact: Evora,Anne Mobile Phone: [phone] Relation: Other  [name] is HCPOA.  CHIEF COMPLAIN: Initial palliative care visit/Confusion  Visit is to build trust and highlight Palliative Medicine as specialized medical care for people living with serious illness, aimed at facilitating better quality of life through symptoms relief, assisting with advance care plan and establishing goals of care.  NP called and spoke [name] and he endorsed palliative services. Discussion on the difference between Palliative and Hospice care.  [name] said family will be interested in hospice services for patient when the time comes.  Visit consisted of counseling and education dealing with the complex and emotionally intense issues of symptom management and palliative care in the setting of serious and potentially life-threatening illness. Palliative care team will continue to support patient, patient's family, and medical team.  RECOMMENDATIONS/PLAN:   Advance Care Planning: Our advance care planning conversation included a discussion about  the value and importance of advance care planning, exploration of goals of care in the event of a sudden injury or illness, identification and preparation of a healthcare agent and review and updating or creation of an advance directive document.  CODE STATUS: Patient is a DO NOT RESUSCITATE.   Signed DNR form in facility chart; same document uploaded to epic today.  GOALS OF CARE: Goals of care include to maximize quality of life and symptom management.  Patient would like to be able to stand and transfer safely so she can be discharged back to her assisted living in [place].    I spent 46  minutes providing this consultation. More than 50% of the time in this consultation was spent in counseling and care coordination. ____________________________________________________________________________  Symptom Management/Plan:  Confusion is chronic related to Dementia; patient is no longer delirious. Redirection as needed. Calm approach, descalation techniques.  Continue Deparkote for agitation. Weakness: Continue PT/OT for strengthening, and gait Insomia; Continue Trazadone as ordered. Effective. Palliative will continue to monitor for symptom management/decline and make recommendations as needed. Return 6 weeks or prn. Encouraged to call provider sooner with any concerns.   Family /Caregiver/Community Supports: Patient in SNF for acute rehab.  [name] is involved in patient's care.  Strong family support system identified.  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 female with multiple medical problems including confusion which is chronic worsened in the past month with urinary tract infection.  Patient was delirious, thus impairing her independence and quality of life.  Patient was hospitalized 3/25-3/29/2022 with weakness, confusion, and visual hallucinations related to acute cystitis treated with Rocephin and stabilized; discharged to SNF for acute rehab. History of PAF, essential hypertension, stage IIIb CKD, GAD, history of depression, dyslipidemia and chronic diastolic congestive heart failure. History obtained from review of EMR, discussion with primary team nursing staff and  interview with patient/family.   Review and summarization of Epic records shows history from other than patient. Rest of 10  point system reviewed and negative.  Palliative Care was asked to follow this patient by consultation request of Dr. Algis Downs. Hopper to help address complex decision making in the context of goals of care and advance care planning.  Review lab tests/diagnostics  Results for Deborah, Chung (MRN [id number]) as of 04/18/2020 16:21  Ref. Range 04/11/2020 04:02  Sodium Latest Ref Range: 135 - 145 mmol/L 139  Potassium Latest Ref Range: 3.5 - 5.1 mmol/L 4.0  Chloride Latest Ref Range: 98 - 111 mmol/L 107  CO2 Latest Ref Range: 22 - 32 mmol/L 28  Glucose Latest Ref Range: 70 - 99 mg/dL 90  BUN Latest Ref Range: 8 - 23 mg/dL 13  Creatinine Latest Ref Range: 0.44 - 1.00 mg/dL 8.09  Calcium Latest Ref Range: 8.9 - 10.3 mg/dL 8.5 (L)  Anion gap Latest Ref Range: 5 - 15  4 (L)  GFR, Estimated Latest Ref Range: >60 mL/min >60   Results for Deborah, Chung (MRN [id number]) as of 04/18/2020 16:21  Ref. Range 04/07/2020 11:02  Leukocytes,Ua Latest Ref Range: NEGATIVE  SMALL (A)   No results for input(s): WBC, HGB, HCT, PLT, MCV in the last 168 hours. No results for input(s): NA, K, CL, CO2, BUN, CREATININE, GLUCOSE in the last 168 hours. Latest GFR by Cockcroft Gault (not valid in AKI or ESRD) Estimated Creatinine Clearance: 45.3 mL/min (by C-G formula based on SCr of 0.88 mg/dL). No results for input(s): AST, ALT, ALKPHOS, GGT in the last 168 hours.  Invalid input(s): TBILI, CONJBILI, ALB, TOTALPROTEIN No components found for: ALB No results for input(s): APTT, INR in the last 168 hours.  Invalid input(s): PTPATIENT No results for input(s): BNP, PROBNP in the last 168 hours.  PAST MEDICAL HISTORY:  Past Medical History:  Diagnosis Date  . Arthritis   . Atrial fibrillation (HCC)   . Blood transfusion without reported diagnosis   . Depression   . GAD (generalized anxiety disorder)    . Hearing loss   . Hyperlipidemia   . Hypertension   . Insomnia   . Osteoarthritis   . Pacemaker   . PHN (postherpetic neuralgia) 2010  . Systolic CHF (HCC)      SOCIAL HX:  Social History   Tobacco Use  . Smoking status: Never Smoker  . Smokeless tobacco: Never Used  Substance Use Topics  . Alcohol use: Not Currently     FAMILY HX:  Family History  Problem Relation Age of Onset  . Heart disease Father   . Heart disease Sister     ALLERGIES: No Known Allergies    PERTINENT MEDICATIONS:  Outpatient Encounter Medications as of 04/18/2020  Medication Sig  . acetaminophen (TYLENOL) 325 MG tablet Take 650 mg by mouth 2 (two) times daily as needed (pain).  Marland Kitchen apixaban (ELIQUIS) 2.5 MG TABS tablet Take 2.5 mg by mouth 2 (two) times daily.  Marland Kitchen aspirin 81 MG chewable tablet Chew 81 mg by mouth daily.  . Biotin 39767 MCG TABS Take 10,000 mcg by mouth daily.  . bisacodyl (DULCOLAX) 10 MG suppository Place 10 mg rectally as needed for moderate constipation.  . calcium carbonate (TUMS) 500 MG chewable tablet Chew 1 tablet (200 mg of elemental calcium total) by mouth 2 (two) times daily as needed for indigestion or heartburn.  . DULoxetine (CYMBALTA) 60 MG capsule Take 1 capsule (60 mg total) by mouth daily.  Marland Kitchen esomeprazole (NEXIUM) 20 MG capsule Take 1 capsule (20 mg total) by mouth daily at 12 noon.  Marland Kitchen  famotidine (PEPCID) 20 MG tablet Take 20 mg by mouth daily at 6 (six) AM.  . guaiFENesin (ROBITUSSIN) 100 MG/5ML liquid Take 200 mg by mouth every 4 (four) hours as needed for cough.  . lipase/protease/amylase (CREON) 36000 UNITS CPEP capsule Take by mouth. One daily with each snack  . lipase/protease/amylase (CREON) 36000 UNITS CPEP capsule Take by mouth. Give two caps with each meal for pancreatic insuff.  . loratadine (CLARITIN) 10 MG tablet Take 10 mg by mouth daily.  . magnesium hydroxide (MILK OF MAGNESIA) 400 MG/5ML suspension Take by mouth daily as needed for mild constipation.   . metoprolol tartrate (LOPRESSOR) 25 MG tablet TAKE 1 TABLET BY MOUTH TWICE A DAY (Patient taking differently: Take 25 mg by mouth 2 (two) times daily. (BETA BLOCKER))  . NON FORMULARY Apply 1 each topically daily at 12 noon. Jobst Hose KN XL  . nystatin (MYCOSTATIN/NYSTOP) powder Apply 1 application topically 3 (three) times daily. Under both breast  . ondansetron (ZOFRAN) 4 MG tablet Take 4 mg by mouth every 4 (four) hours as needed for nausea or vomiting.  . polyvinyl alcohol (LIQUIFILM TEARS) 1.4 % ophthalmic solution Place 1 drop into both eyes 4 (four) times daily as needed for dry eyes.  . pregabalin (LYRICA) 75 MG capsule Take 1 capsule (75 mg total) by mouth 3 (three) times daily.  . Probiotic Product (PROBIOTIC PO) Take 1 capsule by mouth daily.  . QUEtiapine (SEROQUEL) 50 MG tablet Take 75 mg by mouth at bedtime.  . torsemide (DEMADEX) 10 MG tablet Take 1 tablet (10 mg total) by mouth daily as needed.  . traZODone (DESYREL) 50 MG tablet TAKE 1 TABLET (50 MG TOTAL) BY MOUTH AT BEDTIME AS NEEDED FOR SLEEP. (Patient taking differently: Take 50 mg by mouth at bedtime as needed for sleep.)   No facility-administered encounter medications on file as of 04/18/2020.    ROS  General: NAD EYES: No vision changes, wears eye glasse ENMT: No dysphagia no xerostomia Cardiovascular: No chest pain Pulmonary: No cough, SOB  Abdomen:no constipation or diarrhea GU: No dysuria or urinary frequency MSK:   ROM limitations, no falls reported Skin: No rashes or wounds Neurological: weakness Psych:  positive mood Heme/lymph/immuno: No bruises or abnormal bleeding   PHYSICAL EXAM  General: In no acute distress, cooperative Cardiovascular: regular rate and rhythm; no edema in BLE Pulmonary: no cough, no increased work of breathing, normal respiratory effort Abdomen: soft, non tender, positive bowel sounds in all quadrants GU:  no suprapubic tenderness Eyes: Normal lids, no discharge, sclera  anicteric ENMT: Moist mucous membranes Musculoskeletal: non ambulatory, gets around with wheelchair.  Skin: no rash to visible skin, dry skin, warm without cyanosis Psych: non-anxious affect Neurological: Weakness but otherwise non focal, confused Heme/lymph/immuno: no bruises, no bleeding  Thank you for the opportunity to participate in the care of Deborah Chung Please call our office at [phone] if we can be of additional assistance.  Note: Portions of this note were generated with Scientist, clinical (histocompatibility and immunogenetics). Dictation errors may occur despite best attempts at proofreading.  Rosaura Carpenter, NP

## 2020-04-18 NOTE — Assessment & Plan Note (Signed)
03/10/2020 CT of the brain reviewed.  This shows atrophy as well as small vessel white matter ischemic disease.  These findings and her advanced age suggest the etiology of her dementia is vascular.

## 2020-04-18 NOTE — Addendum Note (Signed)
Addended by: Dierdre Harness I on: 04/18/2020 05:11 PM   Modules accepted: Level of Service

## 2020-04-24 ENCOUNTER — Non-Acute Institutional Stay (SKILLED_NURSING_FACILITY): Payer: Medicare PPO | Admitting: Adult Health

## 2020-04-24 ENCOUNTER — Encounter: Payer: Self-pay | Admitting: Adult Health

## 2020-04-24 DIAGNOSIS — I480 Paroxysmal atrial fibrillation: Secondary | ICD-10-CM | POA: Diagnosis not present

## 2020-04-24 DIAGNOSIS — I5032 Chronic diastolic (congestive) heart failure: Secondary | ICD-10-CM | POA: Diagnosis not present

## 2020-04-24 NOTE — Progress Notes (Signed)
Location:  [facility] Room Number: 115/A Place of Service:  SNF (31) Provider:  Kenard Gower, DNP, FNP-BC  Patient Care Team: Deborah Grandchild, MD as PCP - General (Internal Medicine) Deborah Grandchild, MD (Internal Medicine) Deborah Grandchild, MD (Internal Medicine)  Extended Emergency Contact Information Primary Emergency Contact: Deborah Chung [place] of [place] Home Phone: [phone] Relation: Son Secondary Emergency Contact: Deborah Chung Mobile Phone: [phone] Relation: Other  Code Status:  DNR  Goals of care: Advanced Directive information Advanced Directives 04/24/2020  Does Patient Have a Medical Advance Directive? Yes  Type of Advance Directive Out of facility DNR (pink MOST or yellow form)  Does patient want to make changes to medical advance directive? No - Patient declined  Copy of Healthcare Power of Attorney in Chart? -  Would patient like information on creating a medical advance directive? -  Pre-existing out of facility DNR order (yellow form or pink MOST form) Yellow form placed in chart (order not valid for inpatient use)     Chief Complaint  Patient presents with  . Acute Visit    Short Term Rehab     HPI:  Pt is a 87 y.o. female seen today for short-term rehabilitation visit. She slid out of wheelchair this morning according to charge nurse. She did not sustain any injury. Latest BNP 238, 04/14/20. Latest weight is 225.6 lbs, gained 3 lbs in 2 days. She has 2+BLE edema. She is currently taking Demadex 5 mg daily. HRs ranging from 59 to 74. She takes metoprolol titrate 25 mg twice a day, diltiazem 60 mg twice a day and Eliquis 2.5 mg twice a day atrial fibrillation.   Past Medical History:  Diagnosis Date  . Arthritis   . Atrial fibrillation (HCC)   . Blood transfusion without reported diagnosis   . Depression   . GAD (generalized anxiety disorder)   . Hearing loss   . Hyperlipidemia   . Hypertension   .  Insomnia   . Osteoarthritis   . Pacemaker   . PHN (postherpetic neuralgia) 2010  . Systolic CHF [facility])    Past Surgical History:  Procedure Laterality Date  . ABDOMINAL HYSTERECTOMY    . PACEMAKER PLACEMENT    . TOTAL KNEE ARTHROPLASTY Bilateral     No Known Allergies  Outpatient Encounter Medications as of 04/24/2020  Medication Sig  . acetaminophen (TYLENOL) 325 MG tablet Take 650 mg by mouth 2 (two) times daily as needed (pain).  Marland Kitchen apixaban (ELIQUIS) 2.5 MG TABS tablet Take 2.5 mg by mouth 2 (two) times daily.  Marland Kitchen aspirin 81 MG chewable tablet Chew 81 mg by mouth daily.  . Biotin 10932 MCG TABS Take 10,000 mcg by mouth daily.  . bisacodyl (DULCOLAX) 10 MG suppository Place 10 mg rectally as needed for moderate constipation.  . calcium carbonate (TUMS) 500 MG chewable tablet Chew 1 tablet (200 mg of elemental calcium total) by mouth 2 (two) times daily as needed for indigestion or heartburn.  . diltiazem (CARDIZEM) 60 MG tablet Take 60 mg by mouth 2 (two) times daily.  . DULoxetine (CYMBALTA) 60 MG capsule Take 1 capsule (60 mg total) by mouth daily.  . famotidine (PEPCID) 20 MG tablet Take 20 mg by mouth daily at 6 (six) AM.  . guaiFENesin (ROBITUSSIN) 100 MG/5ML liquid Take 200 mg by mouth every 4 (four) hours as needed for cough.  . lipase/protease/amylase (CREON) 36000 UNITS CPEP capsule Take by mouth. One daily with each snack  .  lipase/protease/amylase (CREON) 36000 UNITS CPEP capsule Take by mouth. Give two caps with each meal for pancreatic insuff.  . loratadine (CLARITIN) 10 MG tablet Take 10 mg by mouth daily.  . magnesium hydroxide (MILK OF MAGNESIA) 400 MG/5ML suspension Take by mouth daily as needed for mild constipation.  . metoprolol tartrate (LOPRESSOR) 25 MG tablet TAKE 1 TABLET BY MOUTH TWICE A DAY  . NON FORMULARY Apply 1 each topically daily at 12 noon. Jobst Hose KN XL  . NON FORMULARY 120 ml Medpass twice daily to help prevent malnutrition  . nystatin  (MYCOSTATIN/NYSTOP) powder Apply 1 application topically 3 (three) times daily. Under both breast  . Omeprazole 20 MG TBDD Take 1 tablet by mouth daily in the afternoon.  . ondansetron (ZOFRAN) 4 MG tablet Take 4 mg by mouth every 4 (four) hours as needed for nausea or vomiting.  . polyvinyl alcohol (LIQUIFILM TEARS) 1.4 % ophthalmic solution Place 1 drop into both eyes 4 (four) times daily as needed for dry eyes.  . pregabalin (LYRICA) 75 MG capsule Take 1 capsule (75 mg total) by mouth 3 (three) times daily.  . Probiotic Product (PROBIOTIC PO) Take 1 capsule by mouth daily.  . QUEtiapine (SEROQUEL) 50 MG tablet Take 75 mg by mouth at bedtime.  . torsemide (DEMADEX) 5 MG tablet Take 5 mg by mouth daily.  . traZODone (DESYREL) 50 MG tablet TAKE 1 TABLET (50 MG TOTAL) BY MOUTH AT BEDTIME AS NEEDED FOR SLEEP.  . [DISCONTINUED] esomeprazole (NEXIUM) 20 MG capsule Take 1 capsule (20 mg total) by mouth daily at 12 noon.  . [DISCONTINUED] torsemide (DEMADEX) 10 MG tablet Take 1 tablet (10 mg total) by mouth daily as needed.   No facility-administered encounter medications on file as of 04/24/2020.    Review of Systems  Unable to obtain due to dementia   Immunization History  Administered Date(s) Administered  . Influenza, High Dose Seasonal PF 09/11/2015, 10/15/2016  . Influenza-Unspecified 10/22/2017, 10/08/2018, 09/22/2019  . Moderna Sars-Covid-2 Vaccination 03/15/2019, 04/07/2019  . Pneumococcal Conjugate-13 06/29/2013  . Pneumococcal Polysaccharide-23 10/20/2008, 11/01/2019  . Tdap 06/29/2013   Pertinent  Health Maintenance Due  Topic Date Due  . INFLUENZA VACCINE  08/14/2020  . DEXA SCAN  Completed  . PNA vac Low Risk Adult  Completed   Fall Risk  04/21/2019 12/24/2017 06/24/2017 05/31/2015 05/30/2015  Falls in the past year? 0 0 No No No  Number falls in past yr: 0 0 - - -  Injury with Fall? 0 0 - - -  Risk for fall due to : Impaired balance/gait;Impaired mobility Impaired  mobility;Impaired balance/gait - - -  Follow up Falls evaluation completed;Education provided - - - -     Vitals:   04/24/20 1143  BP: 135/71  Pulse: 74  Resp: 18  Temp: (!) 97.3 F (36.3 C)  Height: 5\' 3"  (1.6 m)   Body mass index is 35.25 kg/m.  Physical Exam  GENERAL APPEARANCE: Well nourished. In no acute distress.  SKIN:  Skin is warm and dry. Morbidly obese. MOUTH and THROAT: Lips are without lesions. Oral mucosa is moist and without lesions.  RESPIRATORY: Breathing is even & unlabored, BS CTAB CARDIAC: RRR, no murmur,no extra heart sounds, BLE edema GI: Abdomen soft, normal BS, no masses, no tenderness NEUROLOGICAL: There is no tremor. Speech is clear. Alert to self, disoriented to time and place. PSYCHIATRIC:  Affect and behavior are appropriate  Labs reviewed: Recent Labs    04/08/20 0259 04/09/20 0134 04/11/20  0402 04/14/20 0000  NA 137 139 139 141  K 4.0 3.8 4.0 4.3  CL 102 106 107 106  CO2 29 28 28  26*  GLUCOSE 115* 105* 90  --   BUN 14 18 13 14   CREATININE 1.25* 1.30* 0.88 1.0  CALCIUM 9.0 8.4* 8.5* 9.1  MG 2.2  --   --   --    Recent Labs    01/08/20 1709 03/16/20 1553 04/07/20 1107  AST 17 13* 15  ALT 11 9 9   ALKPHOS 140* 143* 140*  BILITOT 0.7 0.7 0.5  PROT 6.5 6.6 6.8  ALBUMIN 3.5 3.4* 3.6   Recent Labs    04/28/19 1535 01/08/20 1709 03/16/20 1553 04/07/20 1107 04/08/20 0259 04/09/20 0134 04/14/20 0000  WBC 6.2 7.0   < > 6.8 8.6 5.7  --   NEUTROABS 3.3 3.9  --  5.4  --   --   --   HGB 13.2 12.2   < > 12.9 12.9 11.1* 14.6  HCT 41.7 38.8   < > 39.6 38.6 35.0* 43  MCV 100.0 97.0   < > 94.5 92.6 94.6  --   PLT 186 211   < > 219 192 170 298   < > = values in this interval not displayed.   Lab Results  Component Value Date   TSH 1.29 04/18/2020   Lab Results  Component Value Date   HGBA1C 5.6 04/21/2019   Lab Results  Component Value Date   CHOL 192 04/21/2019   HDL 43.40 04/21/2019   LDLCALC 109 (H) 04/21/2019    LDLDIRECT 106.0 05/16/2014   TRIG 198.0 (H) 04/21/2019   CHOLHDL 4 04/21/2019    Significant Diagnostic Results in last 30 days:  CT Head Wo Contrast  Result Date: 04/07/2020 CLINICAL DATA:  Delirium.  Increased weakness and confusion EXAM: CT HEAD WITHOUT CONTRAST TECHNIQUE: Contiguous axial images were obtained from the base of the skull through the vertex without intravenous contrast. COMPARISON:  03/16/2020 FINDINGS: Brain: The heart size is normal. No substantial pericardial effusion. Diffuse loss of parenchymal volume is consistent with atrophy. Patchy low attenuation in the deep hemispheric and periventricular white matter is nonspecific, but likely reflects chronic microvascular ischemic demyelination. Vascular: No hyperdense vessel or unexpected calcification. Skull: No evidence for fracture. No worrisome lytic or sclerotic lesion. Sinuses/Orbits: The visualized paranasal sinuses and mastoid air cells are clear. Visualized portions of the globes and intraorbital fat are unremarkable. Other: None. IMPRESSION: 1. No acute intracranial abnormality. 2. Atrophy with chronic small vessel white matter ischemic disease. Electronically Signed   By: 06/21/2019 M.D.   On: 04/07/2020 13:27   DG Chest Port 1 View  Result Date: 04/07/2020 CLINICAL DATA:  Possible sepsis. EXAM: PORTABLE CHEST 1 VIEW COMPARISON:  03/16/2020 FINDINGS: Heart size upper limits of normal. Chronic aortic atherosclerosis. Dual lead pacemaker with leads in the region of the right atrium and right ventricle. Pulmonary vascularity is normal. The lungs are clear. No infiltrate, collapse or effusion. No acute bone finding. IMPRESSION: No active disease. Pacemaker. Aortic atherosclerosis. Electronically Signed   By: 04/09/2020 M.D.   On: 04/07/2020 12:06   ECHOCARDIOGRAM LIMITED  Result Date: 04/09/2020    ECHOCARDIOGRAM LIMITED REPORT   Patient Name:   Deborah Chung Date of Exam: 04/09/2020 Medical Rec #:  04/11/2020     Height:        64.0 in Accession #:    Gibson Ramp    Weight:  185.0 lb Date of Birth:  12-21-1926      BSA:          1.893 m Patient Age:    89 years      BP:           101/83 mmHg Patient Gender: F             HR:           92 bpm. Exam Location:  Inpatient Procedure: Limited Echo, Limited Color Doppler and Cardiac Doppler Indications:    atrial fibrillation  History:        Patient has prior history of Echocardiogram examinations, most                 recent 11/26/2019. Abnormal ECG and Pacemaker, chronic kidney                 disease, Arrythmias:Atrial Fibrillation; Risk                 Factors:Hypertension and Dyslipidemia.  Sonographer:    Delcie Roch Referring Phys: [id number] JESSICA U VANN IMPRESSIONS  1. Left ventricular ejection fraction, by estimation, is 60 to 65%. The left ventricle has normal function. The left ventricle has no regional wall motion abnormalities. Left ventricular diastolic parameters are indeterminate.  2. Right ventricular systolic function is normal. The right ventricular size is normal.  3. Left atrial size was mildly dilated.  4. The mitral valve is normal in structure. Trivial mitral valve regurgitation. No evidence of mitral stenosis.  5. The aortic valve is tricuspid. Aortic valve regurgitation is not visualized. Mild aortic valve sclerosis is present, with no evidence of aortic valve stenosis.  6. The inferior vena cava is normal in size with greater than 50% respiratory variability, suggesting right atrial pressure of 3 mmHg. FINDINGS  Left Ventricle: Left ventricular ejection fraction, by estimation, is 60 to 65%. The left ventricle has normal function. The left ventricle has no regional wall motion abnormalities. The left ventricular internal cavity size was normal in size. There is  no left ventricular hypertrophy. Left ventricular diastolic parameters are indeterminate. Right Ventricle: The right ventricular size is normal.Right ventricular systolic function is normal. Left  Atrium: Left atrial size was mildly dilated. Right Atrium: Right atrial size was normal in size. Pericardium: There is no evidence of pericardial effusion. Mitral Valve: The mitral valve is normal in structure. Trivial mitral valve regurgitation. No evidence of mitral valve stenosis. Tricuspid Valve: The tricuspid valve is normal in structure. Tricuspid valve regurgitation is trivial. No evidence of tricuspid stenosis. Aortic Valve: The aortic valve is tricuspid. Aortic valve regurgitation is not visualized. Mild aortic valve sclerosis is present, with no evidence of aortic valve stenosis. Pulmonic Valve: The pulmonic valve was normal in structure. Pulmonic valve regurgitation is not visualized. No evidence of pulmonic stenosis. Aorta: The aortic root is normal in size and structure. Venous: The inferior vena cava is normal in size with greater than 50% respiratory variability, suggesting right atrial pressure of 3 mmHg.  Additional Comments: A device lead is visualized. IVC IVC diam: 1.30 cm  AORTA Ao Asc diam: 3.10 cm Olga Millers MD Electronically signed by Olga Millers MD Signature Date/Time: 04/09/2020/12:12:04 PM    Final     Assessment/Plan  1. Chronic diastolic CHF (congestive heart failure) (HCC) -  Will increase Demadex from 5 mg daily to 10 mg daily  2. Paroxysmal atrial fibrillation (HCC) -Rate controlled, continue metoprolol tartrate and diltiazem for rate control and  Eliquis for anticoagulation    Family/ staff Communication: Discussed plan of care with resident and charge nurse.  Labs/tests ordered: BMP  Goals of care:   Short-term care   Kenard GowerMonina Medina-Vargas, DNP, MSN, FNP-BC [facility] [phone] (Monday-Friday 8:00 a.m. - 5:00 p.m.) [phone] (after hours)

## 2020-04-25 ENCOUNTER — Other Ambulatory Visit: Payer: Self-pay | Admitting: Adult Health

## 2020-04-25 DIAGNOSIS — B0229 Other postherpetic nervous system involvement: Secondary | ICD-10-CM

## 2020-04-25 MED ORDER — PREGABALIN 75 MG PO CAPS: 75.0000 mg | ORAL_CAPSULE | Freq: Three times a day (TID) | ORAL | 1 refills | Status: DC

## 2020-05-04 ENCOUNTER — Non-Acute Institutional Stay (SKILLED_NURSING_FACILITY): Payer: Medicare PPO | Admitting: Adult Health

## 2020-05-04 ENCOUNTER — Encounter: Payer: Self-pay | Admitting: Adult Health

## 2020-05-04 DIAGNOSIS — I5032 Chronic diastolic (congestive) heart failure: Secondary | ICD-10-CM

## 2020-05-04 DIAGNOSIS — I4891 Unspecified atrial fibrillation: Secondary | ICD-10-CM

## 2020-05-04 DIAGNOSIS — F0151 Vascular dementia with behavioral disturbance: Secondary | ICD-10-CM

## 2020-05-04 DIAGNOSIS — F333 Major depressive disorder, recurrent, severe with psychotic symptoms: Secondary | ICD-10-CM | POA: Diagnosis not present

## 2020-05-04 DIAGNOSIS — B0229 Other postherpetic nervous system involvement: Secondary | ICD-10-CM | POA: Diagnosis not present

## 2020-05-04 DIAGNOSIS — F01518 Vascular dementia, unspecified severity, with other behavioral disturbance: Secondary | ICD-10-CM

## 2020-05-04 NOTE — Telephone Encounter (Signed)
This encounter was created in error - please disregard.

## 2020-05-04 NOTE — Progress Notes (Addendum)
Location:  [facility] Room Number: 115 A Place of Service:  SNF (31) Provider:  Kenard Gower, DNP, FNP-BC  Patient Care Team: Deborah Grandchild, MD as PCP - General (Internal Medicine) Deborah Grandchild, MD (Internal Medicine) Deborah Grandchild, MD (Internal Medicine)  Extended Emergency Contact Information Primary Emergency Contact: [name] [place] Home Phone: [phone] Relation: Son Secondary Emergency Contact: Eisler,Anne Mobile Phone: [phone] Relation: Other  Code Status:   DNR  Goals of care: Advanced Directive information Advanced Directives 04/24/2020  Does Patient Have a Medical Advance Directive? Yes  Type of Advance Directive Out of facility DNR (pink MOST or yellow form)  Does patient want to make changes to medical advance directive? No - Patient declined  Copy of Healthcare Power of Attorney in Chart? -  Would patient like information on creating a medical advance directive? -  Pre-existing out of facility DNR order (yellow form or pink MOST form) Yellow form placed in chart (order not valid for inpatient use)     Chief Complaint  Patient presents with  . Acute Visit    Short-term Care Visit    HPI:  Pt is a 87 y.o. female seen today for medical management of chronic diseases. She is a short-term care resident of [facility]. Resident was recently started on Seroquel 12.5 mg at 3 PM in addition to Seroquel 75 mg at bedtime for psychosis. Noted to have sundowning behavior. She was, also, started on Trazodone 50 mg at bedtime for for insomnia. No reported SOB. She takes Torsemide 10 mg daily for chronic diastolic CHF.   Past Medical History:  Diagnosis Date  . Arthritis   . Atrial fibrillation (HCC)   . Blood transfusion without reported diagnosis   . Depression   . GAD (generalized anxiety disorder)   . Hearing loss   . Hyperlipidemia   . Hypertension   . Insomnia   .  Osteoarthritis   . Pacemaker   . PHN (postherpetic neuralgia) 2010  . Systolic CHF [facility])    Past Surgical History:  Procedure Laterality Date  . ABDOMINAL HYSTERECTOMY    . PACEMAKER PLACEMENT    . TOTAL KNEE ARTHROPLASTY Bilateral     No Known Allergies  Outpatient Encounter Medications as of 05/04/2020  Medication Sig  . acetaminophen (TYLENOL) 325 MG tablet Take 650 mg by mouth 2 (two) times daily as needed (pain).  Marland Kitchen apixaban (ELIQUIS) 2.5 MG TABS tablet Take 2.5 mg by mouth 2 (two) times daily.  Marland Kitchen aspirin 81 MG chewable tablet Chew 81 mg by mouth daily.  . Biotin 14970 MCG TABS Take 10,000 mcg by mouth daily.  . bisacodyl (DULCOLAX) 10 MG suppository Place 10 mg rectally as needed for moderate constipation.  . calcium carbonate (TUMS) 500 MG chewable tablet Chew 1 tablet (200 mg of elemental calcium total) by mouth 2 (two) times daily as needed for indigestion or heartburn.  . diltiazem (CARDIZEM) 60 MG tablet Take 60 mg by mouth 2 (two) times daily.  . DULoxetine (CYMBALTA) 60 MG capsule Take 1 capsule (60 mg total) by mouth daily.  . famotidine (PEPCID) 20 MG tablet Take 20 mg by mouth daily at 6 (six) AM.  . guaiFENesin (ROBITUSSIN) 100 MG/5ML liquid Take 200 mg by mouth every 4 (four) hours as needed for cough.  . lipase/protease/amylase (CREON) 36000 UNITS CPEP capsule Take by mouth. One daily with each snack  . lipase/protease/amylase (CREON) 36000 UNITS CPEP capsule Take by  mouth. Give two caps with each meal for pancreatic insuff.  . loratadine (CLARITIN) 10 MG tablet Take 10 mg by mouth daily.  . magnesium hydroxide (MILK OF MAGNESIA) 400 MG/5ML suspension Take by mouth daily as needed for mild constipation.  . metoprolol tartrate (LOPRESSOR) 25 MG tablet TAKE 1 TABLET BY MOUTH TWICE A DAY  . NON FORMULARY Apply 1 each topically daily at 12 noon. Jobst Hose KN XL  . NON FORMULARY 120 ml Medpass twice daily to help prevent malnutrition  . nystatin (MYCOSTATIN/NYSTOP)  powder Apply 1 application topically 3 (three) times daily. Under both breast  . Omeprazole 20 MG TBDD Take 1 tablet by mouth daily in the afternoon.  . ondansetron (ZOFRAN) 4 MG tablet Take 4 mg by mouth every 4 (four) hours as needed for nausea or vomiting.  . polyvinyl alcohol (LIQUIFILM TEARS) 1.4 % ophthalmic solution Place 1 drop into both eyes 4 (four) times daily as needed for dry eyes.  . pregabalin (LYRICA) 75 MG capsule Take 1 capsule (75 mg total) by mouth 3 (three) times daily.  . Probiotic Product (PROBIOTIC PO) Take 1 capsule by mouth daily.  . QUEtiapine (SEROQUEL) 50 MG tablet Take 75 mg by mouth at bedtime.  . torsemide (DEMADEX) 5 MG tablet Take 5 mg by mouth daily.  . traZODone (DESYREL) 50 MG tablet TAKE 1 TABLET (50 MG TOTAL) BY MOUTH AT BEDTIME AS NEEDED FOR SLEEP.   No facility-administered encounter medications on file as of 05/04/2020.    Review of Systems  Unable to obtain due to dementia    Immunization History  Administered Date(s) Administered  . Influenza, High Dose Seasonal PF 09/11/2015, 10/15/2016  . Influenza-Unspecified 10/22/2017, 10/08/2018, 09/22/2019  . Moderna Sars-Covid-2 Vaccination 03/15/2019, 04/07/2019  . Pneumococcal Conjugate-13 06/29/2013  . Pneumococcal Polysaccharide-23 10/20/2008, 11/01/2019  . Tdap 06/29/2013   Pertinent  Health Maintenance Due  Topic Date Due  . INFLUENZA VACCINE  08/14/2020  . DEXA SCAN  Completed  . PNA vac Low Risk Adult  Completed   Fall Risk  04/21/2019 12/24/2017 06/24/2017 05/31/2015 05/30/2015  Falls in the past year? 0 0 No No No  Number falls in past yr: 0 0 - - -  Injury with Fall? 0 0 - - -  Risk for fall due to : Impaired balance/gait;Impaired mobility Impaired mobility;Impaired balance/gait - - -  Follow up Falls evaluation completed;Education provided - - - -     Vitals:   05/04/20 1000  BP: 130/66  Pulse: 71  Resp: 17  Temp: (!) 97.3 F (36.3 C)  Weight: 226 lb (102.5 kg)  Height: 5\' 3"   (1.6 m)   Body mass index is 40.03 kg/m.  Physical Exam  GENERAL APPEARANCE: Well nourished. In no acute distress. Morbidly obese. SKIN:  Skin is warm and dry.  MOUTH and THROAT: Lips are without lesions. Oral mucosa is moist and without lesions. Tongue is normal in shape, size, and color and without lesions RESPIRATORY: Breathing is even & unlabored, BS CTAB CARDIAC: RRR, no murmur,no extra heart sounds GI: Abdomen soft, normal BS, no masses, no tenderness EXTREMITIES:  Able to move X 4 extremities NEUROLOGICAL: There is no tremor. Speech is clear. Alert to self, disoriented to time and place. PSYCHIATRIC:  Affect and behavior are appropriate  Labs reviewed: Recent Labs    04/08/20 0259 04/09/20 0134 04/11/20 0402 04/14/20 0000  NA 137 139 139 141  K 4.0 3.8 4.0 4.3  CL 102 106 107 106  CO2 29  28 28 26*  GLUCOSE 115* 105* 90  --   BUN 14 18 13 14   CREATININE 1.25* 1.30* 0.88 1.0  CALCIUM 9.0 8.4* 8.5* 9.1  MG 2.2  --   --   --    Recent Labs    01/08/20 1709 03/16/20 1553 04/07/20 1107  AST 17 13* 15  ALT 11 9 9   ALKPHOS 140* 143* 140*  BILITOT 0.7 0.7 0.5  PROT 6.5 6.6 6.8  ALBUMIN 3.5 3.4* 3.6   Recent Labs    01/08/20 1709 03/16/20 1553 04/07/20 1107 04/08/20 0259 04/09/20 0134 04/14/20 0000  WBC 7.0   < > 6.8 8.6 5.7  --   NEUTROABS 3.9  --  5.4  --   --   --   HGB 12.2   < > 12.9 12.9 11.1* 14.6  HCT 38.8   < > 39.6 38.6 35.0* 43  MCV 97.0   < > 94.5 92.6 94.6  --   PLT 211   < > 219 192 170 298   < > = values in this interval not displayed.   Lab Results  Component Value Date   TSH 1.29 04/18/2020   Lab Results  Component Value Date   HGBA1C 5.6 04/21/2019   Lab Results  Component Value Date   CHOL 192 04/21/2019   HDL 43.40 04/21/2019   LDLCALC 109 (H) 04/21/2019   LDLDIRECT 106.0 05/16/2014   TRIG 198.0 (H) 04/21/2019   CHOLHDL 4 04/21/2019    Significant Diagnostic Results in last 30 days:  CT Head Wo Contrast  Result Date:  04/07/2020 CLINICAL DATA:  Delirium.  Increased weakness and confusion EXAM: CT HEAD WITHOUT CONTRAST TECHNIQUE: Contiguous axial images were obtained from the base of the skull through the vertex without intravenous contrast. COMPARISON:  03/16/2020 FINDINGS: Brain: The heart size is normal. No substantial pericardial effusion. Diffuse loss of parenchymal volume is consistent with atrophy. Patchy low attenuation in the deep hemispheric and periventricular white matter is nonspecific, but likely reflects chronic microvascular ischemic demyelination. Vascular: No hyperdense vessel or unexpected calcification. Skull: No evidence for fracture. No worrisome lytic or sclerotic lesion. Sinuses/Orbits: The visualized paranasal sinuses and mastoid air cells are clear. Visualized portions of the globes and intraorbital fat are unremarkable. Other: None. IMPRESSION: 1. No acute intracranial abnormality. 2. Atrophy with chronic small vessel white matter ischemic disease. Electronically Signed   By: 04/09/2020 M.D.   On: 04/07/2020 13:27   DG Chest Port 1 View  Result Date: 04/07/2020 CLINICAL DATA:  Possible sepsis. EXAM: PORTABLE CHEST 1 VIEW COMPARISON:  03/16/2020 FINDINGS: Heart size upper limits of normal. Chronic aortic atherosclerosis. Dual lead pacemaker with leads in the region of the right atrium and right ventricle. Pulmonary vascularity is normal. The lungs are clear. No infiltrate, collapse or effusion. No acute bone finding. IMPRESSION: No active disease. Pacemaker. Aortic atherosclerosis. Electronically Signed   By: 04/09/2020 M.D.   On: 04/07/2020 12:06   ECHOCARDIOGRAM LIMITED  Result Date: 04/09/2020    ECHOCARDIOGRAM LIMITED REPORT   Patient Name:   Deborah Chung Date of Exam: 04/09/2020 Medical Rec #:  [facility]     Height:       64.0 in Accession #:    04/11/2020    Weight:       185.0 lb Date of Birth:  03-Jan-1927      BSA:          1.893 m Patient Age:    49 years  BP:           101/83  mmHg Patient Gender: F             HR:           92 bpm. Exam Location:  Inpatient Procedure: Limited Echo, Limited Color Doppler and Cardiac Doppler Indications:    atrial fibrillation  History:        Patient has prior history of Echocardiogram examinations, most                 recent 11/26/2019. Abnormal ECG and Pacemaker, chronic kidney                 disease, Arrythmias:Atrial Fibrillation; Risk                 Factors:Hypertension and Dyslipidemia.  Sonographer:    Delcie Roch Referring Phys: [id number] JESSICA U VANN IMPRESSIONS  1. Left ventricular ejection fraction, by estimation, is 60 to 65%. The left ventricle has normal function. The left ventricle has no regional wall motion abnormalities. Left ventricular diastolic parameters are indeterminate.  2. Right ventricular systolic function is normal. The right ventricular size is normal.  3. Left atrial size was mildly dilated.  4. The mitral valve is normal in structure. Trivial mitral valve regurgitation. No evidence of mitral stenosis.  5. The aortic valve is tricuspid. Aortic valve regurgitation is not visualized. Mild aortic valve sclerosis is present, with no evidence of aortic valve stenosis.  6. The inferior vena cava is normal in size with greater than 50% respiratory variability, suggesting right atrial pressure of 3 mmHg. FINDINGS  Left Ventricle: Left ventricular ejection fraction, by estimation, is 60 to 65%. The left ventricle has normal function. The left ventricle has no regional wall motion abnormalities. The left ventricular internal cavity size was normal in size. There is  no left ventricular hypertrophy. Left ventricular diastolic parameters are indeterminate. Right Ventricle: The right ventricular size is normal.Right ventricular systolic function is normal. Left Atrium: Left atrial size was mildly dilated. Right Atrium: Right atrial size was normal in size. Pericardium: There is no evidence of pericardial effusion. Mitral Valve: The  mitral valve is normal in structure. Trivial mitral valve regurgitation. No evidence of mitral valve stenosis. Tricuspid Valve: The tricuspid valve is normal in structure. Tricuspid valve regurgitation is trivial. No evidence of tricuspid stenosis. Aortic Valve: The aortic valve is tricuspid. Aortic valve regurgitation is not visualized. Mild aortic valve sclerosis is present, with no evidence of aortic valve stenosis. Pulmonic Valve: The pulmonic valve was normal in structure. Pulmonic valve regurgitation is not visualized. No evidence of pulmonic stenosis. Aorta: The aortic root is normal in size and structure. Venous: The inferior vena cava is normal in size with greater than 50% respiratory variability, suggesting right atrial pressure of 3 mmHg.  Additional Comments: A device lead is visualized. IVC IVC diam: 1.30 cm  AORTA Ao Asc diam: 3.10 cm Olga Millers MD Electronically signed by Olga Millers MD Signature Date/Time: 04/09/2020/12:12:04 PM    Final     Assessment/Plan  1. Atrial fibrillation, unspecified type (HCC) -  Rate controlled -   Continue metoprolol tartrate and diltiazem for rate control and Eliquis for anticoagulation  2. Chronic diastolic CHF (congestive heart failure) (HCC) -   No SOB, continue Torsemide  3. PHN (postherpetic neuralgia) -   Stable, continue Lyrica  4. Major depression, recurrent, with psychosis -   Has sundowning behavior -  Continue Cymbalta and Seroquel -  followed by psych NP  5. Vascular dementia with behavior disturbance (HCC) -   BIMS score 2/15, ranging in severe cognitive impairment -   Continue supportive care     Family/ staff Communication: Discussed plan of care with charge nurse.  Labs/tests ordered: None  Goals of care:   Short-term care   Kenard GowerMonina Medina-Vargas, DNP, MSN, FNP-BC [facility] [phone] (Monday-Friday 8:00 a.m. - 5:00 p.m.) [phone] (after hours)

## 2020-05-08 ENCOUNTER — Non-Acute Institutional Stay (SKILLED_NURSING_FACILITY): Payer: Medicare PPO | Admitting: Adult Health

## 2020-05-08 ENCOUNTER — Encounter: Payer: Self-pay | Admitting: Adult Health

## 2020-05-08 DIAGNOSIS — F0151 Vascular dementia with behavioral disturbance: Secondary | ICD-10-CM

## 2020-05-08 DIAGNOSIS — I5032 Chronic diastolic (congestive) heart failure: Secondary | ICD-10-CM

## 2020-05-08 DIAGNOSIS — B0229 Other postherpetic nervous system involvement: Secondary | ICD-10-CM | POA: Diagnosis not present

## 2020-05-08 DIAGNOSIS — I4891 Unspecified atrial fibrillation: Secondary | ICD-10-CM

## 2020-05-08 DIAGNOSIS — N390 Urinary tract infection, site not specified: Secondary | ICD-10-CM

## 2020-05-08 DIAGNOSIS — F5101 Primary insomnia: Secondary | ICD-10-CM

## 2020-05-08 DIAGNOSIS — F01518 Vascular dementia, unspecified severity, with other behavioral disturbance: Secondary | ICD-10-CM

## 2020-05-08 DIAGNOSIS — K219 Gastro-esophageal reflux disease without esophagitis: Secondary | ICD-10-CM

## 2020-05-08 DIAGNOSIS — F333 Major depressive disorder, recurrent, severe with psychotic symptoms: Secondary | ICD-10-CM

## 2020-05-08 NOTE — Progress Notes (Signed)
Location:  [facility] Room Number: 115/A Place of Service:  SNF (31) Provider:  Kenard GowerMedina-Vargas, Lisanne Ponce, DNP, FNP-BC  Patient Care Team: Deborah GrandchildJones, Thomas L, MD as PCP - General (Internal Medicine) Deborah GrandchildJones, Thomas L, MD (Internal Medicine) Deborah GrandchildJones, Thomas L, MD (Internal Medicine)  Extended Emergency Contact Information Primary Emergency Contact: Deborah Chung  [place] Home Phone: [phone] Relation: Son Secondary Emergency Contact: Deborah Chung Mobile Phone: [phone] Relation: Other  Code Status: DNR   Goals of care: Advanced Directive information Advanced Directives 05/08/2020  Does Patient Have a Medical Advance Directive? Yes  Type of Advance Directive Out of facility DNR (pink MOST or yellow form)  Does patient want to make changes to medical advance directive? No - Patient declined  Copy of Healthcare Power of Attorney in Chart? -  Would patient like information on creating a medical advance directive? -  Pre-existing out of facility DNR order (yellow form or pink MOST form) Yellow form placed in chart (order not valid for inpatient use)     Chief Complaint  Patient presents with  . Discharge Note    Discharge Visit     HPI:  Pt is a 87 y.o. female who is for discharge to ALF with Home health PT, OT and ST.   She was admitted to [facility] on 04/11/20 post hospital admission 04/07/20 to 04/11/20.  He has a PMH of chronic systolic CHF, pacemaker placement, hypertension, hyperlipidemia, anxiety/depression and atrial fibrillation.  She was hospitalized due to inability to get out of bed with assistance and having more confusion than usual. She presented to the hospital with weakness, confusion and visual hallucinations. Of note, 2 days prior to hospital presentation, she was started on Bactrim for UTI.  Blood and urine cultures were negative.  She was treated with IV Rocephin for possible UTI.  Demadex and Aldactone  for her chronic diastolic CHF were held.  Echocardiogram done on 3/27 with EF 60 to 65%.  At [facility], she was noted to have agitation and having sundowning. Psych NP was consulted. She was started on Seroquel and Trazodone. She was, also, restarted on her Demadex.  Patient was admitted to this facility for short-term rehabilitation after the patient's recent hospitalization.  Patient has completed SNF rehabilitation and therapy has cleared the patient for discharge.   Past Medical History:  Diagnosis Date  . Arthritis   . Atrial fibrillation (HCC)   . Blood transfusion without reported diagnosis   . Depression   . GAD (generalized anxiety disorder)   . Hearing loss   . Hyperlipidemia   . Hypertension   . Insomnia   . Osteoarthritis   . Pacemaker   . PHN (postherpetic neuralgia) 2010  . Systolic CHF [facility](HCC)    Past Surgical History:  Procedure Laterality Date  . ABDOMINAL HYSTERECTOMY    . PACEMAKER PLACEMENT    . TOTAL KNEE ARTHROPLASTY Bilateral     No Known Allergies  Outpatient Encounter Medications as of 05/08/2020  Medication Sig  . acetaminophen (TYLENOL) 325 MG tablet Take 650 mg by mouth 2 (two) times daily as needed (pain).  Marland Kitchen. apixaban (ELIQUIS) 2.5 MG TABS tablet Take 2.5 mg by mouth 2 (two) times daily.  Marland Kitchen. aspirin 81 MG chewable tablet Chew 81 mg by mouth daily.  . Biotin 2956210000 MCG TABS Take 10,000 mcg by mouth daily.  . bisacodyl (DULCOLAX) 10 MG suppository Place 10 mg rectally as needed for moderate constipation.  . calcium carbonate (TUMS) 500 MG  chewable tablet Chew 1 tablet (200 mg of elemental calcium total) by mouth 2 (two) times daily as needed for indigestion or heartburn.  . diltiazem (CARDIZEM) 60 MG tablet Take 60 mg by mouth 2 (two) times daily.  . DULoxetine (CYMBALTA) 60 MG capsule Take 1 capsule (60 mg total) by mouth daily.  . famotidine (PEPCID) 20 MG tablet Take 20 mg by mouth daily at 6 (six) AM.  . guaiFENesin (ROBITUSSIN) 100 MG/5ML liquid  Take 200 mg by mouth every 4 (four) hours as needed for cough.  . lipase/protease/amylase (CREON) 36000 UNITS CPEP capsule Take by mouth. One daily with each snack  . lipase/protease/amylase (CREON) 36000 UNITS CPEP capsule Take by mouth. Give two caps with each meal for pancreatic insuff.  . loperamide (IMODIUM A-D) 2 MG tablet Take by mouth. Give Immodium AD 4 mg p.o. after 1st loose stool as needed. (Physician Order)  . loratadine (CLARITIN) 10 MG tablet Take 10 mg by mouth daily.  . magnesium hydroxide (MILK OF MAGNESIA) 400 MG/5ML suspension Take by mouth daily as needed for mild constipation.  . metoprolol tartrate (LOPRESSOR) 25 MG tablet TAKE 1 TABLET BY MOUTH TWICE A DAY  . NON FORMULARY Apply 1 each topically daily at 12 noon. Jobst Hose KN XL  . NON FORMULARY 120 ml Medpass twice daily to help prevent malnutrition  . Omeprazole 20 MG TBDD Take 1 tablet by mouth daily in the afternoon.  . ondansetron (ZOFRAN) 4 MG tablet Take 4 mg by mouth every 4 (four) hours as needed for nausea or vomiting.  . polyvinyl alcohol (LIQUIFILM TEARS) 1.4 % ophthalmic solution Place 1 drop into both eyes 4 (four) times daily as needed for dry eyes.  . pregabalin (LYRICA) 75 MG capsule Take 1 capsule (75 mg total) by mouth 3 (three) times daily.  . Probiotic Product (PROBIOTIC PO) Take 1 capsule by mouth daily.  . QUEtiapine (SEROQUEL) 25 MG tablet Take 12.5 mg by mouth daily in the afternoon. For Behavior  . QUEtiapine (SEROQUEL) 50 MG tablet Take 75 mg by mouth at bedtime.  . torsemide (DEMADEX) 10 MG tablet Take 10 mg by mouth daily.  . traZODone (DESYREL) 50 MG tablet Take 25 mg by mouth at bedtime.  . [DISCONTINUED] nystatin (MYCOSTATIN/NYSTOP) powder Apply 1 application topically 3 (three) times daily. Under both breast  . [DISCONTINUED] torsemide (DEMADEX) 5 MG tablet Take 5 mg by mouth daily.  . [DISCONTINUED] traZODone (DESYREL) 50 MG tablet TAKE 1 TABLET (50 MG TOTAL) BY MOUTH AT BEDTIME AS NEEDED  FOR SLEEP.   No facility-administered encounter medications on file as of 05/08/2020.    Review of Systems  Unable to obtain due to dementia    Immunization History  Administered Date(s) Administered  . Influenza, High Dose Seasonal PF 09/11/2015, 10/15/2016  . Influenza-Unspecified 10/22/2017, 10/08/2018, 09/22/2019  . Moderna Sars-Covid-2 Vaccination 03/15/2019, 04/07/2019  . Pneumococcal Conjugate-13 06/29/2013  . Pneumococcal Polysaccharide-23 10/20/2008, 11/01/2019  . Tdap 06/29/2013   Pertinent  Health Maintenance Due  Topic Date Due  . INFLUENZA VACCINE  08/14/2020  . DEXA SCAN  Completed  . PNA vac Low Risk Adult  Completed   Fall Risk  04/21/2019 12/24/2017 06/24/2017 05/31/2015 05/30/2015  Falls in the past year? 0 0 No No No  Number falls in past yr: 0 0 - - -  Injury with Fall? 0 0 - - -  Risk for fall due to : Impaired balance/gait;Impaired mobility Impaired mobility;Impaired balance/gait - - -  Follow up Falls  evaluation completed;Education provided - - - -     Vitals:   05/08/20 0953  BP: 138/72  Pulse: 70  Resp: 18  Temp: 98.7 F (37.1 C)  Weight: 228 lb 3.2 oz (103.5 kg)  Height: 5\' 3"  (1.6 m)   Body mass index is 40.42 kg/m.  Physical Exam  GENERAL APPEARANCE: Well nourished. In no acute distress. Morbidly obese. SKIN:  Skin is warm and dry.  MOUTH and THROAT: Lips are without lesions. Oral mucosa is moist and without lesions.  RESPIRATORY: Breathing is even & unlabored, BS CTAB CARDIAC: RRR, no murmur,no extra heart sounds GI: Abdomen soft, normal BS, no masses, no tenderness NEUROLOGICAL: There is no tremor. Speech is clear. Alert to self, disoriented to time and place.  PSYCHIATRIC:  Affect and behavior are appropriate  Labs reviewed: Recent Labs    04/08/20 0259 04/09/20 0134 04/11/20 0402 04/14/20 0000  NA 137 139 139 141  K 4.0 3.8 4.0 4.3  CL 102 106 107 106  CO2 29 28 28  26*  GLUCOSE 115* 105* 90  --   BUN 14 18 13 14    CREATININE 1.25* 1.30* 0.88 1.0  CALCIUM 9.0 8.4* 8.5* 9.1  MG 2.2  --   --   --    Recent Labs    01/08/20 1709 03/16/20 1553 04/07/20 1107  AST 17 13* 15  ALT 11 9 9   ALKPHOS 140* 143* 140*  BILITOT 0.7 0.7 0.5  PROT 6.5 6.6 6.8  ALBUMIN 3.5 3.4* 3.6   Recent Labs    01/08/20 1709 03/16/20 1553 04/07/20 1107 04/08/20 0259 04/09/20 0134 04/14/20 0000  WBC 7.0   < > 6.8 8.6 5.7  --   NEUTROABS 3.9  --  5.4  --   --   --   HGB 12.2   < > 12.9 12.9 11.1* 14.6  HCT 38.8   < > 39.6 38.6 35.0* 43  MCV 97.0   < > 94.5 92.6 94.6  --   PLT 211   < > 219 192 170 298   < > = values in this interval not displayed.   Lab Results  Component Value Date   TSH 1.29 04/18/2020   Lab Results  Component Value Date   HGBA1C 5.6 04/21/2019   Lab Results  Component Value Date   CHOL 192 04/21/2019   HDL 43.40 04/21/2019   LDLCALC 109 (H) 04/21/2019   LDLDIRECT 106.0 05/16/2014   TRIG 198.0 (H) 04/21/2019   CHOLHDL 4 04/21/2019    Significant Diagnostic Results in last 30 days:  ECHOCARDIOGRAM LIMITED  Result Date: 04/09/2020    ECHOCARDIOGRAM LIMITED REPORT   Patient Name:   Deborah Chung Date of Exam: 04/09/2020 Medical Rec #:  [id number]     Height:       64.0 in Accession #:    [id number]    Weight:       185.0 lb Date of Birth:  07-Feb-1926      BSA:          1.893 m Patient Age:    87 years      BP:           101/83 mmHg Patient Gender: F             HR:           92 bpm. Exam Location:  Inpatient Procedure: Limited Echo, Limited Color Doppler and Cardiac Doppler Indications:    atrial fibrillation  History:  Patient has prior history of Echocardiogram examinations, most                 recent 11/26/2019. Abnormal ECG and Pacemaker, chronic kidney                 disease, Arrythmias:Atrial Fibrillation; Risk                 Factors:Hypertension and Dyslipidemia.  Sonographer:    Delcie Roch Referring Phys: [id number] JESSICA U VANN IMPRESSIONS  1. Left ventricular ejection  fraction, by estimation, is 60 to 65%. The left ventricle has normal function. The left ventricle has no regional wall motion abnormalities. Left ventricular diastolic parameters are indeterminate.  2. Right ventricular systolic function is normal. The right ventricular size is normal.  3. Left atrial size was mildly dilated.  4. The mitral valve is normal in structure. Trivial mitral valve regurgitation. No evidence of mitral stenosis.  5. The aortic valve is tricuspid. Aortic valve regurgitation is not visualized. Mild aortic valve sclerosis is present, with no evidence of aortic valve stenosis.  6. The inferior vena cava is normal in size with greater than 50% respiratory variability, suggesting right atrial pressure of 3 mmHg. FINDINGS  Left Ventricle: Left ventricular ejection fraction, by estimation, is 60 to 65%. The left ventricle has normal function. The left ventricle has no regional wall motion abnormalities. The left ventricular internal cavity size was normal in size. There is  no left ventricular hypertrophy. Left ventricular diastolic parameters are indeterminate. Right Ventricle: The right ventricular size is normal.Right ventricular systolic function is normal. Left Atrium: Left atrial size was mildly dilated. Right Atrium: Right atrial size was normal in size. Pericardium: There is no evidence of pericardial effusion. Mitral Valve: The mitral valve is normal in structure. Trivial mitral valve regurgitation. No evidence of mitral valve stenosis. Tricuspid Valve: The tricuspid valve is normal in structure. Tricuspid valve regurgitation is trivial. No evidence of tricuspid stenosis. Aortic Valve: The aortic valve is tricuspid. Aortic valve regurgitation is not visualized. Mild aortic valve sclerosis is present, with no evidence of aortic valve stenosis. Pulmonic Valve: The pulmonic valve was normal in structure. Pulmonic valve regurgitation is not visualized. No evidence of pulmonic stenosis. Aorta:  The aortic root is normal in size and structure. Venous: The inferior vena cava is normal in size with greater than 50% respiratory variability, suggesting right atrial pressure of 3 mmHg.  Additional Comments: A device lead is visualized. IVC IVC diam: 1.30 cm  AORTA Ao Asc diam: 3.10 cm Olga Millers MD Electronically signed by Olga Millers MD Signature Date/Time: 04/09/2020/12:12:04 PM    Final     Assessment/Plan  1. Urinary tract infection without hematuria, site unspecified -  resolved  2. Atrial fibrillation, unspecified type (HCC) - apixaban (ELIQUIS) 2.5 MG TABS tablet; Take 1 tablet (2.5 mg total) by mouth 2 (two) times daily.  Dispense: 60 tablet; Refill: 0 - diltiazem (CARDIZEM) 60 MG tablet; Take 1 tablet (60 mg total) by mouth 2 (two) times daily.  Dispense: 60 tablet; Refill: 0 - metoprolol tartrate (LOPRESSOR) 25 MG tablet; Take 1 tablet (25 mg total) by mouth 2 (two) times daily.  Dispense: 60 tablet; Refill: 0  3. Chronic diastolic CHF (congestive heart failure) (HCC) - torsemide (DEMADEX) 10 MG tablet; Take 1 tablet (10 mg total) by mouth daily.  Dispense: 30 tablet; Refill: 0  4. PHN (postherpetic neuralgia) - pregabalin (LYRICA) 75 MG capsule; Take 1 capsule (75 mg total) by mouth 3 (  three) times daily.  Dispense: 90 capsule; Refill: 0  5. Depression, major, recurrent, severe with psychosis (HCC) - DULoxetine (CYMBALTA) 60 MG capsule; Take 1 capsule (60 mg total) by mouth daily.  Dispense: 60 capsule; Refill: 0 - QUEtiapine (SEROQUEL) 25 MG tablet; Take 0.5 tablets (12.5 mg total) by mouth daily in the afternoon. For Behavior  Dispense: 15 tablet; Refill: 0 - QUEtiapine (SEROQUEL) 50 MG tablet; Take 1.5 tablets (75 mg total) by mouth at bedtime.  Dispense: 45 tablet; Refill: 0  6. Gastroesophageal reflux disease without esophagitis - famotidine (PEPCID) 20 MG tablet; Take 1 tablet (20 mg total) by mouth daily at 6 (six) AM.  Dispense: 30 tablet; Refill: 0 - Omeprazole  20 MG TBDD; Take 1 tablet by mouth daily in the afternoon.  Dispense: 30 tablet; Refill: 0  7. Vascular dementia with behavior disturbance (HCC) -  BIMS score 2/15, ranging in severe cognitive impairment -  Continue supportive care  8. Primary insomnia - traZODone (DESYREL) 50 MG tablet; Take 0.5 tablets (25 mg total) by mouth at bedtime.  Dispense: 15 tablet; Refill: 0      I have filled out patient's discharge paperwork and e-prescribed medications.  Patient will have home health PT, OT and ST.  DME provided:  Semi- electric hospital bed  Patient suffers from chronic diastolic CHF and has trouble breathing at night when head is elevated less than 30 degrees. Bed wedges do not provide enough elevation to resolve breathing issues. Shortness of breath cause patient to require frequent and immediate changes in body position which cannot be achieved with a normal bed.    Total discharge time: Greater than 30 minutes  Discharge time involved coordination of the discharge process with social worker, nursing staff and therapy department. Medical justification for home health services/DME verified.    Deborah Gower, DNP, MSN, FNP-BC [facility] [phone] (Monday-Friday 8:00 a.m. - 5:00 p.m.) [phone] (after hours)

## 2020-05-09 ENCOUNTER — Other Ambulatory Visit: Payer: Self-pay | Admitting: Adult Health

## 2020-05-09 DIAGNOSIS — I5032 Chronic diastolic (congestive) heart failure: Secondary | ICD-10-CM

## 2020-05-09 DIAGNOSIS — K219 Gastro-esophageal reflux disease without esophagitis: Secondary | ICD-10-CM

## 2020-05-09 DIAGNOSIS — I4891 Unspecified atrial fibrillation: Secondary | ICD-10-CM

## 2020-05-09 MED ORDER — METOPROLOL TARTRATE 25 MG PO TABS: 25.0000 mg | ORAL_TABLET | Freq: Two times a day (BID) | ORAL | 0 refills | Status: DC

## 2020-05-09 MED ORDER — DULOXETINE HCL 60 MG PO CPEP: 60.0000 mg | ORAL_CAPSULE | Freq: Every day | ORAL | 0 refills | Status: AC

## 2020-05-09 MED ORDER — QUETIAPINE FUMARATE 25 MG PO TABS: 12.5000 mg | ORAL_TABLET | Freq: Every day | ORAL | 0 refills | Status: DC

## 2020-05-09 MED ORDER — TORSEMIDE 10 MG PO TABS: 10.0000 mg | ORAL_TABLET | Freq: Every day | ORAL | 0 refills | Status: AC

## 2020-05-09 MED ORDER — OMEPRAZOLE 20 MG PO TBDD: 1.0000 | DELAYED_RELEASE_TABLET | Freq: Every day | ORAL | 0 refills | Status: DC

## 2020-05-09 MED ORDER — ONDANSETRON HCL 4 MG PO TABS: 4.0000 mg | ORAL_TABLET | ORAL | 0 refills | Status: DC | PRN

## 2020-05-09 MED ORDER — APIXABAN 2.5 MG PO TABS: 2.5000 mg | ORAL_TABLET | Freq: Two times a day (BID) | ORAL | 0 refills | Status: DC

## 2020-05-09 MED ORDER — FAMOTIDINE 20 MG PO TABS: 20.0000 mg | ORAL_TABLET | Freq: Every day | ORAL | 0 refills | Status: DC

## 2020-05-09 MED ORDER — LORATADINE 10 MG PO TABS: 10.0000 mg | ORAL_TABLET | Freq: Every day | ORAL | 0 refills | Status: DC

## 2020-05-09 MED ORDER — DULOXETINE HCL 60 MG PO CPEP: 60.0000 mg | ORAL_CAPSULE | Freq: Every day | ORAL | 0 refills | Status: DC

## 2020-05-09 MED ORDER — QUETIAPINE FUMARATE 50 MG PO TABS: 75.0000 mg | ORAL_TABLET | Freq: Every day | ORAL | 0 refills | Status: DC

## 2020-05-09 MED ORDER — PANCRELIPASE (LIP-PROT-AMYL) 36000-114000 UNITS PO CPEP: ORAL_CAPSULE | ORAL | 0 refills | Status: DC

## 2020-05-09 MED ORDER — TRAZODONE HCL 50 MG PO TABS: 25.0000 mg | ORAL_TABLET | Freq: Every day | ORAL | 0 refills | Status: DC

## 2020-05-09 MED ORDER — POLYVINYL ALCOHOL 1.4 % OP SOLN: 1.0000 [drp] | Freq: Four times a day (QID) | OPHTHALMIC | 0 refills | Status: AC | PRN

## 2020-05-09 MED ORDER — QUETIAPINE FUMARATE 50 MG PO TABS: 75.0000 mg | ORAL_TABLET | Freq: Every day | ORAL | 0 refills | Status: AC

## 2020-05-09 MED ORDER — DILTIAZEM HCL 60 MG PO TABS: 60.0000 mg | ORAL_TABLET | Freq: Two times a day (BID) | ORAL | 0 refills | Status: DC

## 2020-05-09 MED ORDER — PROBIOTIC 250 MG PO CAPS: 250.0000 mg | ORAL_CAPSULE | Freq: Every day | ORAL | 0 refills | Status: AC

## 2020-05-09 MED ORDER — PANCRELIPASE (LIP-PROT-AMYL) 36000-114000 UNITS PO CPEP: ORAL_CAPSULE | ORAL | 0 refills | Status: AC

## 2020-05-09 MED ORDER — PREGABALIN 75 MG PO CAPS: 75.0000 mg | ORAL_CAPSULE | Freq: Three times a day (TID) | ORAL | 0 refills | Status: DC

## 2020-05-09 MED ORDER — ONDANSETRON HCL 4 MG PO TABS: 4.0000 mg | ORAL_TABLET | ORAL | 0 refills | Status: AC | PRN

## 2020-05-09 MED ORDER — TORSEMIDE 10 MG PO TABS: 10.0000 mg | ORAL_TABLET | Freq: Every day | ORAL | 0 refills | Status: DC

## 2020-05-09 MED ORDER — APIXABAN 2.5 MG PO TABS: 2.5000 mg | ORAL_TABLET | Freq: Two times a day (BID) | ORAL | 0 refills | Status: AC

## 2020-05-09 MED ORDER — BIOTIN 10000 MCG PO TABS: 10000.0000 ug | ORAL_TABLET | Freq: Every day | ORAL | 0 refills | Status: DC

## 2020-05-09 MED ORDER — POLYVINYL ALCOHOL 1.4 % OP SOLN: 1.0000 [drp] | Freq: Four times a day (QID) | OPHTHALMIC | 0 refills | Status: DC | PRN

## 2020-05-09 MED ORDER — BIOTIN 10000 MCG PO TABS: 10000.0000 ug | ORAL_TABLET | Freq: Every day | ORAL | 0 refills | Status: AC

## 2020-05-09 MED ORDER — PROBIOTIC 250 MG PO CAPS: 250.0000 mg | ORAL_CAPSULE | Freq: Every day | ORAL | 0 refills | Status: DC

## 2020-05-09 MED ORDER — METOPROLOL TARTRATE 25 MG PO TABS: 25.0000 mg | ORAL_TABLET | Freq: Two times a day (BID) | ORAL | 0 refills | Status: AC

## 2020-05-09 MED ORDER — LORATADINE 10 MG PO TABS: 10.0000 mg | ORAL_TABLET | Freq: Every day | ORAL | 0 refills | Status: AC

## 2020-05-17 ENCOUNTER — Inpatient Hospital Stay: Payer: Medicare PPO | Admitting: Internal Medicine

## 2020-05-24 ENCOUNTER — Ambulatory Visit (INDEPENDENT_AMBULATORY_CARE_PROVIDER_SITE_OTHER): Payer: Medicare PPO

## 2020-05-24 DIAGNOSIS — I442 Atrioventricular block, complete: Secondary | ICD-10-CM

## 2020-05-25 LAB — CUP PACEART REMOTE DEVICE CHECK
Battery Remaining Longevity: 18 mo
Battery Remaining Percentage: 35 %
Brady Statistic RA Percent Paced: 78 %
Brady Statistic RV Percent Paced: 2 %
Date Time Interrogation Session: 20220512105600
Implantable Lead Implant Date: 20150112
Implantable Lead Implant Date: 20150112
Implantable Lead Location: 753859
Implantable Lead Location: 753860
Implantable Lead Model: 4135
Implantable Lead Model: 4136
Implantable Lead Serial Number: 29308444
Implantable Lead Serial Number: 29569556
Implantable Pulse Generator Implant Date: 20150112
Lead Channel Impedance Value: 541 Ohm
Lead Channel Impedance Value: 823 Ohm
Lead Channel Pacing Threshold Amplitude: 0.6 V
Lead Channel Pacing Threshold Amplitude: 0.9 V
Lead Channel Pacing Threshold Pulse Width: 0.4 ms
Lead Channel Pacing Threshold Pulse Width: 0.5 ms
Lead Channel Setting Pacing Amplitude: 1.1 V
Lead Channel Setting Pacing Amplitude: 2.4 V
Lead Channel Setting Pacing Pulse Width: 0.4 ms
Lead Channel Setting Sensing Sensitivity: 3 mV
Pulse Gen Serial Number: 391753

## 2020-06-15 NOTE — Progress Notes (Signed)
Remote pacemaker transmission.   

## 2020-07-11 ENCOUNTER — Non-Acute Institutional Stay: Payer: Medicare PPO | Admitting: Hospice

## 2020-07-11 ENCOUNTER — Other Ambulatory Visit: Payer: Self-pay

## 2020-07-11 DIAGNOSIS — R269 Unspecified abnormalities of gait and mobility: Secondary | ICD-10-CM

## 2020-07-11 DIAGNOSIS — F03911 Unspecified dementia, unspecified severity, with agitation: Secondary | ICD-10-CM

## 2020-07-11 DIAGNOSIS — Z515 Encounter for palliative care: Secondary | ICD-10-CM

## 2020-07-11 DIAGNOSIS — F0391 Unspecified dementia with behavioral disturbance: Secondary | ICD-10-CM

## 2020-07-11 NOTE — Progress Notes (Signed)
Therapist, nutritional Palliative Care Consult Note Telephone: [phone]  Fax: [phone]  PATIENT NAME: Deborah Chung DOB: Jul 20, 1926 MRN: [id number]  PRIMARY CARE PROVIDER:   Etta Grandchild, MD Deborah Grandchild, MD [address] [place],  [place] [address]  REFERRING PROVIDER: Etta Grandchild, MD Deborah Grandchild, MD [address] [place],  [place] [address]  RESPONSIBLE PARTY:   Extended Emergency Contact Information Primary Emergency Contact: Deborah, Chung [place] Home Phone: [phone] Relation: Son Secondary Emergency Contact: Deborah,Chung Mobile Phone: [phone] Relation: Other  Deborah Chung is HCPOA. Works at [employer]     Name Relation Home Work Mobile   Deborah Chung Son [phone]     Deborah, Chung   [phone]   Deborah, Chung   [phone]   Deborah, Chung Other   [phone]       Visit is to build trust and highlight Palliative Medicine as specialized medical care for people living with serious illness, aimed at facilitating better quality of life through symptoms relief, assisting with advance care planning and complex medical decision making. NP called Deborah Chung and left him a voicemail with call back number. This is a follow up visit.  RECOMMENDATIONS/PLAN:   Advance Care Planning/Code Status: Patient is a Do Not Resuscitate  Goals of Care: Goals of care include to maximize quality of life and symptom management. Deborah Chung indicated interest in hospice service for patient when it is appropriate.  Visit consisted of counseling and education dealing with the complex and emotionally intense issues of symptom management and palliative care in the setting of serious and potentially life-threatening illness. Palliative care team will continue to support patient, patient's family, and medical team.  Symptom management/Plan:  Gait disturbance:Patient is a high fall risk. Fall  precautions discussed with patient and nursing. PT/OT for gait training and strengthening. Dementia: ongoing memory loss and confusion in line with Dementia disease trajectory. FAST 6C. Safety precautions. Redirection as needed. Calm approach, descalation techniques.  Agitation: Continue Deparkote for agitation. Weakness: Continue PT/OT for strengthening, and gait Insomia; Continue Trazadone as ordered. Effective.  Follow up: Palliative care will continue to follow for complex medical decision making, advance care planning, and clarification of goals. Return 6 weeks or prn. Encouraged to call provider sooner with any concerns.  CHIEF COMPLAINT: Palliative follow up  HISTORY OF PRESENT ILLNESS:  Deborah Chung a 87 y.o. female with multiple medical problems including gait disturbance with recent fall last week with no injuries. History of PAF, essential hypertension, stage IIIb CKD, GAD, history of depression, dyslipidemia and chronic diastolic congestive heart failure, Dementia. Patient denies pain/discomfort. History obtained from review of EMR, discussion with primary team, family and/or patient. Records reviewed and summarized above. All 10 point systems reviewed and are negative except as documented in history of present illness above  Review and summarization of Epic records shows history from other than patient.   Palliative Care was asked to follow this patient o help address complex decision making in the context of advance care planning and goals of care clarification.   PHYSICAL EXAM  General: In no acute distress, appropriately dressed Cardiovascular: regular rate and rhythm; trace edema in BLE, compression hose on Pulmonary: no cough, no increased work of breathing, normal respiratory effort Abdomen: soft, non tender, no guarding, positive bowel sounds in all quadrants GU:  no suprapubic tenderness Eyes: Normal lids, no discharge ENMT: Moist mucous membranes Musculoskeletal:   weakness, gait disturbance Skin: fragile skin, no  rash to visible skin, warm without cyanosis,  Psych: non-anxious affect Neurological: Weakness but otherwise non focal, memory loss Heme/lymph/immuno: no bruises, no bleeding  PERTINENT MEDICATIONS:  Outpatient Encounter Medications as of 07/11/2020  Medication Sig   acetaminophen (TYLENOL) 325 MG tablet Take 650 mg by mouth 2 (two) times daily as needed (pain).   apixaban (ELIQUIS) 2.5 MG TABS tablet Take 1 tablet (2.5 mg total) by mouth 2 (two) times daily.   aspirin 81 MG chewable tablet Chew 81 mg by mouth daily.   Biotin 50569 MCG TABS Take 10,000 mcg by mouth daily.   bisacodyl (DULCOLAX) 10 MG suppository Place 10 mg rectally as needed for moderate constipation.   calcium carbonate (TUMS) 500 MG chewable tablet Chew 1 tablet (200 mg of elemental calcium total) by mouth 2 (two) times daily as needed for indigestion or heartburn.   diltiazem (CARDIZEM) 60 MG tablet Take 1 tablet (60 mg total) by mouth 2 (two) times daily.   DULoxetine (CYMBALTA) 60 MG capsule Take 1 capsule (60 mg total) by mouth daily.   famotidine (PEPCID) 20 MG tablet Take 1 tablet (20 mg total) by mouth daily at 6 (six) AM.   guaiFENesin (ROBITUSSIN) 100 MG/5ML liquid Take 200 mg by mouth every 4 (four) hours as needed for cough.   lipase/protease/amylase (CREON) 36000 UNITS CPEP capsule Give two caps with each meal for pancreatic insuff.   lipase/protease/amylase (CREON) 36000 UNITS CPEP capsule One daily with each snack   loperamide (IMODIUM A-D) 2 MG tablet Take by mouth. Give Immodium AD 4 mg p.o. after 1st loose stool as needed. (Physician Order)   loratadine (CLARITIN) 10 MG tablet Take 1 tablet (10 mg total) by mouth daily.   magnesium hydroxide (MILK OF MAGNESIA) 400 MG/5ML suspension Take by mouth daily as needed for mild constipation.   metoprolol tartrate (LOPRESSOR) 25 MG tablet Take 1 tablet (25 mg total) by mouth 2 (two) times daily.   NON FORMULARY  Apply 1 each topically daily at 12 noon. Jobst Hose KN XL   NON FORMULARY 120 ml Medpass twice daily to help prevent malnutrition   Omeprazole 20 MG TBDD Take 1 tablet by mouth daily in the afternoon.   ondansetron (ZOFRAN) 4 MG tablet Take 1 tablet (4 mg total) by mouth every 4 (four) hours as needed for nausea or vomiting.   polyvinyl alcohol (LIQUIFILM TEARS) 1.4 % ophthalmic solution Place 1 drop into both eyes 4 (four) times daily as needed for dry eyes.   pregabalin (LYRICA) 75 MG capsule Take 1 capsule (75 mg total) by mouth 3 (three) times daily.   QUEtiapine (SEROQUEL) 25 MG tablet Take 0.5 tablets (12.5 mg total) by mouth daily in the afternoon. For Behavior   QUEtiapine (SEROQUEL) 50 MG tablet Take 1.5 tablets (75 mg total) by mouth at bedtime.   Saccharomyces boulardii (PROBIOTIC) 250 MG CAPS Take 250 mg by mouth daily.   torsemide (DEMADEX) 10 MG tablet Take 1 tablet (10 mg total) by mouth daily.   traZODone (DESYREL) 50 MG tablet Take 0.5 tablets (25 mg total) by mouth at bedtime.   No facility-administered encounter medications on file as of 07/11/2020.    HOSPICE ELIGIBILITY/DIAGNOSIS: TBD  PAST MEDICAL HISTORY:  Past Medical History:  Diagnosis Date   Arthritis    Atrial fibrillation (HCC)    Blood transfusion without reported diagnosis    Depression    GAD (generalized anxiety disorder)    Hearing loss    Hyperlipidemia    Hypertension  Insomnia    Osteoarthritis    Pacemaker    PHN (postherpetic neuralgia) 2010   Systolic CHF (HCC)      ALLERGIES: No Known Allergies    I spent  50 minutes providing this consultation; this includes time spent with patient/family, chart review and documentation. More than 50% of the time in this consultation was spent on counseling and coordinating communication   Thank you for the opportunity to participate in the care of Deborah Chung Please call our office at [phone] if we can be of additional assistance.  Note:  Portions of this note were generated with Scientist, clinical (histocompatibility and immunogenetics). Dictation errors may occur despite best attempts at proofreading.  Rosaura Carpenter, NP

## 2020-08-21 IMAGING — DX DG SHOULDER 2+V*L*
3 series · 3 of 3 positions shown · non-contrast
Comparison: None.

CLINICAL DATA: Left shoulder pain status post fall 7 days ago

EXAM:
LEFT SHOULDER - 2+ VIEW

[grashey]
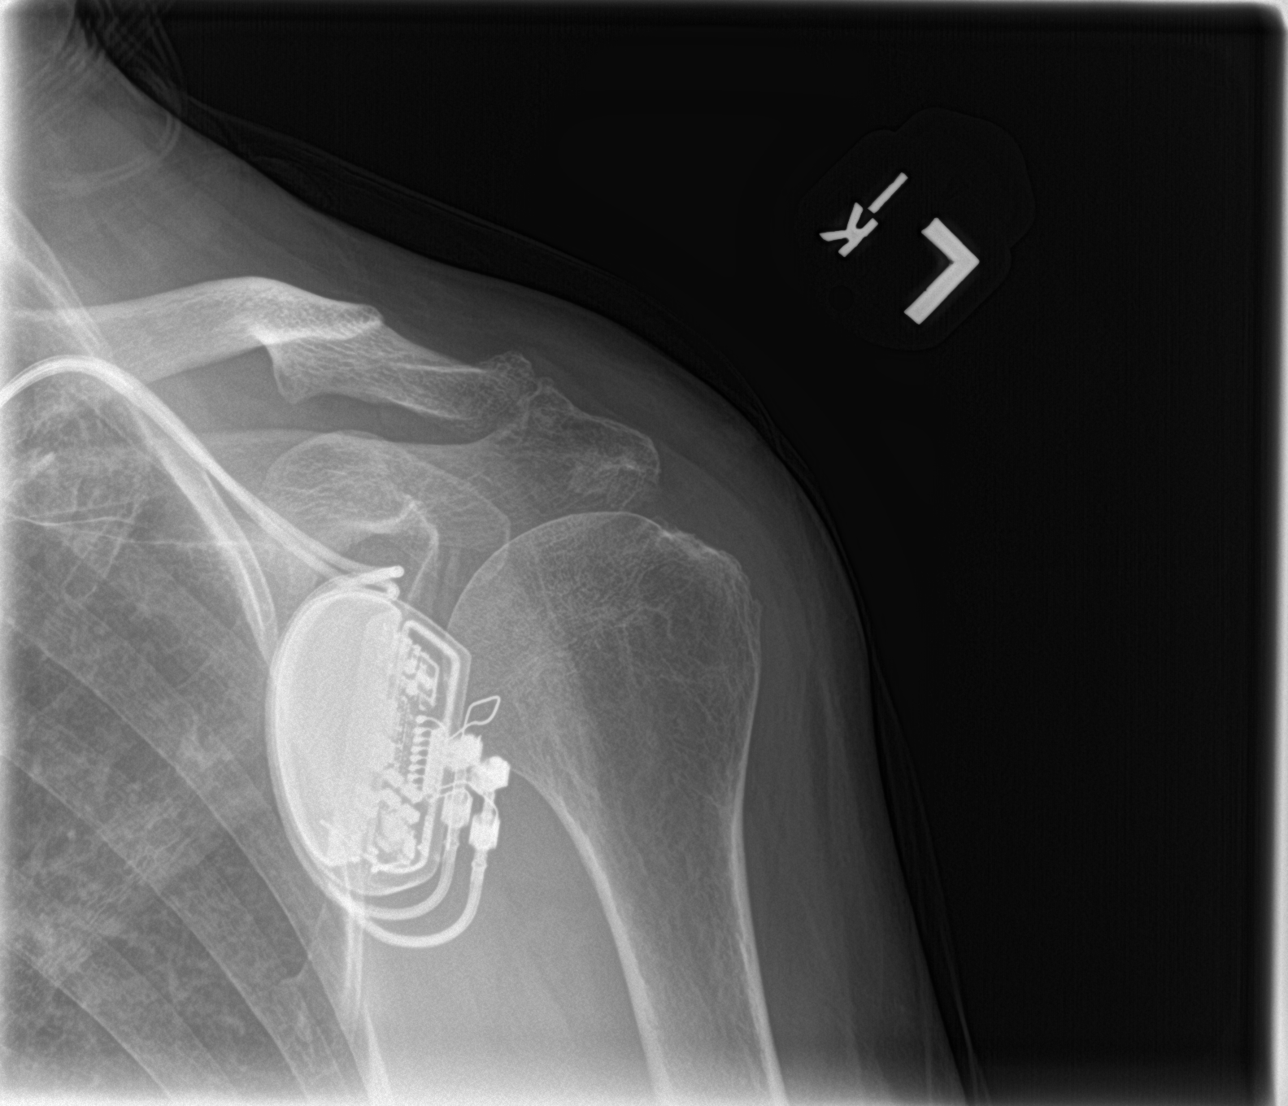

[y view]
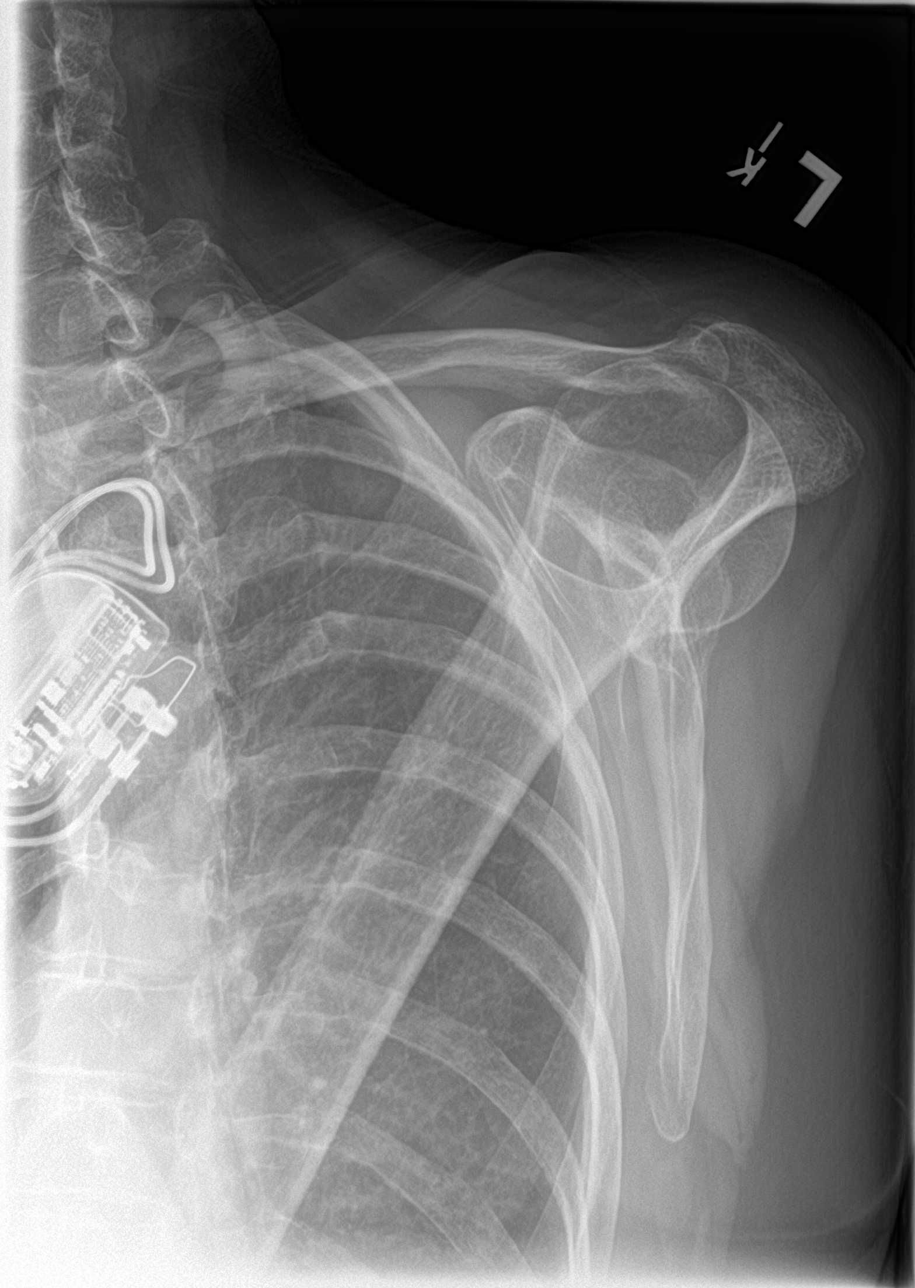

[shoulder axial]
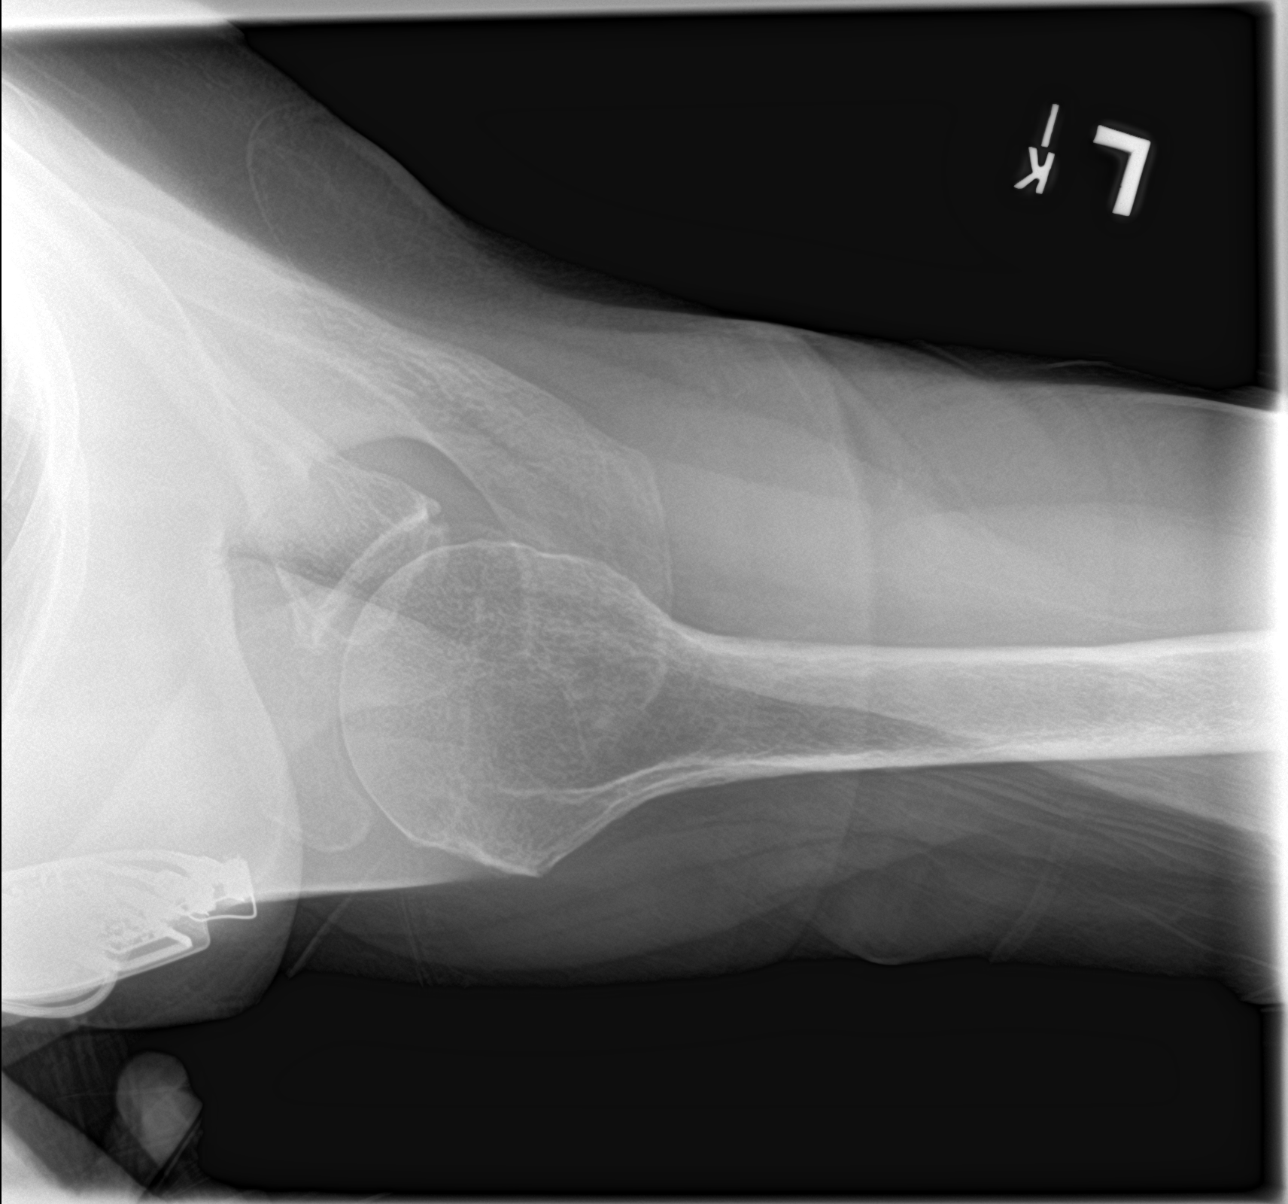

[3 of 3 positions shown; findings below may reference images not displayed]

FINDINGS: There is no fracture or dislocation. The glenohumeral joint is
normal. There are mild degenerative changes of the acromioclavicular
joint. There is a left-sided cardiac pacemaker.
IMPRESSION: No acute osseous injury of the left shoulder.

## 2020-08-22 ENCOUNTER — Telehealth: Payer: Self-pay | Admitting: Internal Medicine

## 2020-08-22 NOTE — Telephone Encounter (Signed)
LVM for pt to rtn my call to [phone] to schedule AWV with [facility]. Please schedule this appt if pt calls or comes into the office.

## 2020-08-30 ENCOUNTER — Ambulatory Visit (INDEPENDENT_AMBULATORY_CARE_PROVIDER_SITE_OTHER): Payer: Medicare PPO

## 2020-08-30 DIAGNOSIS — I442 Atrioventricular block, complete: Secondary | ICD-10-CM | POA: Diagnosis not present

## 2020-08-30 LAB — CUP PACEART REMOTE DEVICE CHECK
Battery Remaining Longevity: 18 mo
Battery Remaining Percentage: 32 %
Brady Statistic RA Percent Paced: 78 %
Brady Statistic RV Percent Paced: 2 %
Date Time Interrogation Session: 20220817031200
Implantable Lead Implant Date: 20150112
Implantable Lead Implant Date: 20150112
Implantable Lead Location: 753859
Implantable Lead Location: 753860
Implantable Lead Model: 4135
Implantable Lead Model: 4136
Implantable Lead Serial Number: 29308444
Implantable Lead Serial Number: 29569556
Implantable Pulse Generator Implant Date: 20150112
Lead Channel Impedance Value: 535 Ohm
Lead Channel Impedance Value: 817 Ohm
Lead Channel Pacing Threshold Amplitude: 0.7 V
Lead Channel Pacing Threshold Amplitude: 0.9 V
Lead Channel Pacing Threshold Pulse Width: 0.4 ms
Lead Channel Pacing Threshold Pulse Width: 0.5 ms
Lead Channel Setting Pacing Amplitude: 1.2 V
Lead Channel Setting Pacing Amplitude: 2.4 V
Lead Channel Setting Pacing Pulse Width: 0.4 ms
Lead Channel Setting Sensing Sensitivity: 3 mV
Pulse Gen Serial Number: 391753

## 2020-09-19 NOTE — Progress Notes (Signed)
Remote pacemaker transmission.   

## 2020-09-27 ENCOUNTER — Other Ambulatory Visit: Payer: Self-pay

## 2020-09-27 ENCOUNTER — Non-Acute Institutional Stay: Payer: Medicare PPO | Admitting: Hospice

## 2020-09-27 DIAGNOSIS — I5032 Chronic diastolic (congestive) heart failure: Secondary | ICD-10-CM

## 2020-09-27 DIAGNOSIS — F0391 Unspecified dementia with behavioral disturbance: Secondary | ICD-10-CM

## 2020-09-27 DIAGNOSIS — F03911 Unspecified dementia, unspecified severity, with agitation: Secondary | ICD-10-CM

## 2020-09-27 DIAGNOSIS — Z515 Encounter for palliative care: Secondary | ICD-10-CM

## 2020-09-27 NOTE — Progress Notes (Signed)
Therapist, nutritional Palliative Care Consult Note Telephone: [phone]  Fax: [phone]  PATIENT NAME: Deborah Chung DOB: 05-Feb-1926 MRN: [id number]  PRIMARY CARE PROVIDER:  [facility] - Lizabeth Leyden NP  REFERRING PROVIDER: Lizabeth Leyden NP  RESPONSIBLE PARTY:  Extended Emergency Contact Information Primary Emergency Contact: [name] [place] Home Phone: [phone] Relation: Son Secondary Emergency Contact: Rathke,Anne Mobile Phone: [phone] Relation: Other  [name] is HCPOA. Works at [employer]     Name Relation Home Work Mobile   [place] Son [phone]     Tallulah, Hosman   [phone]   Magen, Suriano   [phone]   Destani, Wamser Other   [phone]       Visit is to build trust and highlight Palliative Medicine as specialized medical care for people living with serious illness, aimed at facilitating better quality of life through symptoms relief, assisting with advance care planning and complex medical decision making.  Zadie Rhine student present with NP.  This is a follow up visit.  RECOMMENDATIONS/PLAN:   Advance Care Planning/Code Status:Patient is a Do Not Resuscitate  Goals of Care: Goals of care include to maximize quality of life and symptom management.  Visit consisted of counseling and education dealing with the complex and emotionally intense issues of symptom management and palliative care in the setting of serious and potentially life-threatening illness. Palliative care team will continue to support patient, patient's family, and medical team.  Symptom management/Plan:  CHF: Managed with Aldactone. PCP recently increased Aldactone from 25 mg to 50 mg daily.  Bilateral lower extremity edema persists.  NP called PCP and also faxed recommendation: 40 mg Lasix daily x3 days, 10 mEq potassium chloride daily x3 days, check weight daily x3 days, then weekly as  needed.  CBC BMP. Elevation discussed and demonstrated.  Discussed patient's status with [employer] and nurse supervisor Bucky Gait disturbance:Completed PT/OT. Encourage use of rolling walker, safety precautions in place. Fall precautions.  Dementia: Incremental decline in functional and cognitive activities in line with Dementia disease trajectory. FAST 6D from FAST 6C  3 months ago. Continue ongoing supportive care; encourage participation in activities, word search/puzzles Agitation: Continue Seroquel as ordered. Follow up: Palliative care will continue to follow for complex medical decision making, advance care planning, and clarification of goals. Return 6 weeks or prn. Encouraged to call provider sooner with any concerns.  CHIEF COMPLAINT: Palliative follow up  HISTORY OF PRESENT ILLNESS:  Deborah Chung a 87 y.o. female with multiple medical problems including acute on chronic CHF, with associated persisting bilateral lower extremity edema despite recent increase of Aldactone; edema impairs mobility; worse with sitting for long with feet in dependent position; Aldactone not quite helpful at this time.  Patient denies pain/discomfort, endorses occasional cough, no respiratory distress, no fever/chills.  History of chronic gait disturbance, Dementia, PAF, essential hypertension, stage IIIb CKD, GAD, history of depression, dyslipidemia and chronic diastolic congestive heart failure. History obtained from review of EMR, discussion with primary team, family and/or patient. Records reviewed and summarized above. All 10 point systems reviewed and are negative except as documented in history of present illness above  Review and summarization of Epic records shows history from other than patient.   Palliative Care was asked to follow this patient o help address complex decision making in the context of advance care planning and goals of care clarification.   PHYSICAL EXAM  General: In no acute distress,  appropriately dressed Cardiovascular: regular rate and  rhythm; bilateral lower extremity edema +2 Pulmonary: occasional cough - non productive, crackles auscultated in mid and lower lobes bilaterally; oxygen saturation 95% room air, no increased work of breathing Abdomen: soft, non tender, no guarding, positive bowel sounds in all quadrants GU:  no suprapubic tenderness Eyes: Normal lids, no discharge ENMT: Moist mucous membranes Musculoskeletal:  weakness, ambulatory with rolling walker, gets around mostly with wheelchair Skin: no rash to visible skin, warm without cyanosis,  Psych: non-anxious affect Neurological: Weakness but otherwise non focal Heme/lymph/immuno: no bruises, no bleeding  PERTINENT MEDICATIONS:  Outpatient Encounter Medications as of 09/27/2020  Medication Sig   acetaminophen (TYLENOL) 325 MG tablet Take 650 mg by mouth 2 (two) times daily as needed (pain).   apixaban (ELIQUIS) 2.5 MG TABS tablet Take 1 tablet (2.5 mg total) by mouth 2 (two) times daily.   aspirin 81 MG chewable tablet Chew 81 mg by mouth daily.   Biotin 62130 MCG TABS Take 10,000 mcg by mouth daily.   bisacodyl (DULCOLAX) 10 MG suppository Place 10 mg rectally as needed for moderate constipation.   calcium carbonate (TUMS) 500 MG chewable tablet Chew 1 tablet (200 mg of elemental calcium total) by mouth 2 (two) times daily as needed for indigestion or heartburn.   diltiazem (CARDIZEM) 60 MG tablet Take 1 tablet (60 mg total) by mouth 2 (two) times daily.   DULoxetine (CYMBALTA) 60 MG capsule Take 1 capsule (60 mg total) by mouth daily.   famotidine (PEPCID) 20 MG tablet Take 1 tablet (20 mg total) by mouth daily at 6 (six) AM.   guaiFENesin (ROBITUSSIN) 100 MG/5ML liquid Take 200 mg by mouth every 4 (four) hours as needed for cough.   lipase/protease/amylase (CREON) 36000 UNITS CPEP capsule Give two caps with each meal for pancreatic insuff.   lipase/protease/amylase (CREON) 36000 UNITS CPEP capsule One  daily with each snack   loperamide (IMODIUM A-D) 2 MG tablet Take by mouth. Give Immodium AD 4 mg p.o. after 1st loose stool as needed. (Physician Order)   loratadine (CLARITIN) 10 MG tablet Take 1 tablet (10 mg total) by mouth daily.   magnesium hydroxide (MILK OF MAGNESIA) 400 MG/5ML suspension Take by mouth daily as needed for mild constipation.   metoprolol tartrate (LOPRESSOR) 25 MG tablet Take 1 tablet (25 mg total) by mouth 2 (two) times daily.   NON FORMULARY Apply 1 each topically daily at 12 noon. Jobst Hose KN XL   NON FORMULARY 120 ml Medpass twice daily to help prevent malnutrition   Omeprazole 20 MG TBDD Take 1 tablet by mouth daily in the afternoon.   ondansetron (ZOFRAN) 4 MG tablet Take 1 tablet (4 mg total) by mouth every 4 (four) hours as needed for nausea or vomiting.   polyvinyl alcohol (LIQUIFILM TEARS) 1.4 % ophthalmic solution Place 1 drop into both eyes 4 (four) times daily as needed for dry eyes.   pregabalin (LYRICA) 75 MG capsule Take 1 capsule (75 mg total) by mouth 3 (three) times daily.   QUEtiapine (SEROQUEL) 25 MG tablet Take 0.5 tablets (12.5 mg total) by mouth daily in the afternoon. For Behavior   QUEtiapine (SEROQUEL) 50 MG tablet Take 1.5 tablets (75 mg total) by mouth at bedtime.   Saccharomyces boulardii (PROBIOTIC) 250 MG CAPS Take 250 mg by mouth daily.   torsemide (DEMADEX) 10 MG tablet Take 1 tablet (10 mg total) by mouth daily.   traZODone (DESYREL) 50 MG tablet Take 0.5 tablets (25 mg total) by mouth at bedtime.  No facility-administered encounter medications on file as of 09/27/2020.    HOSPICE ELIGIBILITY/DIAGNOSIS: TBD  PAST MEDICAL HISTORY:  Past Medical History:  Diagnosis Date   Arthritis    Atrial fibrillation (HCC)    Blood transfusion without reported diagnosis    Depression    GAD (generalized anxiety disorder)    Hearing loss    Hyperlipidemia    Hypertension    Insomnia    Osteoarthritis    Pacemaker    PHN (postherpetic  neuralgia) 2010   Systolic CHF [facility])      Review lab tests/diagnostics Results for DICIE, EDELEN (MRN [id number]) as of 09/27/2020 17:43  Ref. Range 04/14/2020 00:00  Sodium Latest Ref Range: 137 - 147  141  Potassium Latest Ref Range: 3.4 - 5.3  4.3  Chloride Latest Ref Range: 99 - 108  106  CO2 Latest Ref Range: 13 - 22  26 (A)  Glucose Unknown 92  BUN Latest Ref Range: 4 - 21  14  Creatinine Latest Ref Range: 0.5 - 1.1  1.0  Calcium Latest Ref Range: 8.7 - 10.7  9.1  Results for MELANNIE, METZNER (MRN [id number]) as of 09/27/2020 17:43  Ref. Range 04/14/2020 00:00  RBC Latest Ref Range: 3.87 - 5.11  4.8  Hemoglobin Latest Ref Range: 12.0 - 16.0  14.6  HCT Latest Ref Range: 36 - 46  43  Platelets Latest Ref Range: 150 - 399  298    ALLERGIES: No Known Allergies    Thank you for the opportunity to participate in the care of Deborah Chung Please call our office at [phone] if we can be of additional assistance.  Note: Portions of this note were generated with Scientist, clinical (histocompatibility and immunogenetics). Dictation errors may occur despite best attempts at proofreading.  Deborah Carpenter, NP

## 2020-10-25 ENCOUNTER — Other Ambulatory Visit: Payer: Self-pay

## 2020-10-25 ENCOUNTER — Non-Acute Institutional Stay: Payer: Medicare PPO | Admitting: Hospice

## 2020-10-25 DIAGNOSIS — I5032 Chronic diastolic (congestive) heart failure: Secondary | ICD-10-CM

## 2020-10-25 DIAGNOSIS — Z515 Encounter for palliative care: Secondary | ICD-10-CM

## 2020-10-25 DIAGNOSIS — F03918 Unspecified dementia, unspecified severity, with other behavioral disturbance: Secondary | ICD-10-CM

## 2020-10-25 NOTE — Progress Notes (Signed)
Therapist, nutritional Palliative Care Consult Note Telephone: [phone]  Fax: [phone]  PATIENT NAME: Deborah Chung DOB: 1926-09-24 MRN: [id number]  PRIMARY CARE PROVIDER:   Etta Grandchild, MD Etta Grandchild, MD [address] [place],  [place] [address]  REFERRING PROVIDER: Etta Grandchild, MD Etta Grandchild, MD [address] [place],  [place] [address]  RESPONSIBLE PARTY:  Extended Emergency Contact Information Primary Emergency Contact: Kristilyn, [place] Home Phone: [phone] Relation: Son Secondary Emergency Contact: Yazdi,Anne Mobile Phone: [phone] Relation: Other  Deborah Chung is HCPOA. Works at [employer]     Name Relation Home Work Mobile   [place] Son [phone]     Tabrina, Esty   [phone]   Naziyah, Tieszen   [phone]   Qianna, Clagett Other   [phone]       Visit is to build trust and highlight Palliative Medicine as specialized medical care for people living with serious illness, aimed at facilitating better quality of life through symptoms relief, assisting with advance care planning and complex medical decision making. Clydie Braun DNP student shadowing. This is a follow up visit.  RECOMMENDATIONS/PLAN:   Advance Care Planning/Code Status: Patient remains a DO NOT RESUSCITATE  Goals of Care: Goals of care include to maximize quality of life and symptom management.  Visit consisted of counseling and education dealing with the complex and emotionally intense issues of symptom management and palliative care in the setting of serious and potentially life-threatening illness. Palliative care team will continue to support patient, patient's family, and medical team.  Symptom management/Plan:  CHF: BLE edema improved from last visit. CHF Currently managed with Aldactone and torsemide.  Continue elevation of bilateral lower extremities, reduced salt.  Dementia:  Continue ongoing supportive care.  Memory loss/confusion at baseline.  Fast 6D. Seroquel is effective for agitation.  Follow up: Palliative care will continue to follow for complex medical decision making, advance care planning, and clarification of goals. Return 6 weeks or prn. Encouraged to call provider sooner with any concerns.  CHIEF COMPLAINT: Palliative follow up  HISTORY OF PRESENT ILLNESS:  Deborah Chung a 87 y.o. female with multiple medical problems including chronic diastolic CHF, History obtained from review of EMR, discussion with primary team, family and/or patient. Records reviewed and summarized above. All 10 point systems reviewed and are negative except as documented in history of present illness above  Review and summarization of Epic records shows history from other than patient.   Palliative Care was asked to follow this patient o help address complex decision making in the context of advance care planning and goals of care clarification.   PHYSICAL EXAM  General: In no acute distress, appropriately dressed Cardiovascular: regular rate and rhythm; trace edema in BLE Pulmonary: no cough, no increased work of breathing, normal respiratory effort, No respiratory distress.  Abdomen: soft, non tender, no guarding, positive bowel sounds in all quadrants GU:  no suprapubic tenderness Eyes: Normal lids, no discharge ENMT: Moist mucous membranes Musculoskeletal:  weakness Skin: no rash to visible skin, warm without cyanosis,  Psych: non-anxious affect Neurological: Weakness but otherwise non focal Heme/lymph/immuno: no bruises, no bleeding  PERTINENT MEDICATIONS:  Outpatient Encounter Medications as of 10/25/2020  Medication Sig   acetaminophen (TYLENOL) 325 MG tablet Take 650 mg by mouth 2 (two) times daily as needed (pain).   apixaban (ELIQUIS) 2.5 MG TABS tablet Take 1 tablet (2.5 mg total) by mouth 2 (two) times daily.  aspirin 81 MG chewable tablet Chew 81 mg by mouth  daily.   Biotin 12458 MCG TABS Take 10,000 mcg by mouth daily.   bisacodyl (DULCOLAX) 10 MG suppository Place 10 mg rectally as needed for moderate constipation.   calcium carbonate (TUMS) 500 MG chewable tablet Chew 1 tablet (200 mg of elemental calcium total) by mouth 2 (two) times daily as needed for indigestion or heartburn.   diltiazem (CARDIZEM) 60 MG tablet Take 1 tablet (60 mg total) by mouth 2 (two) times daily.   DULoxetine (CYMBALTA) 60 MG capsule Take 1 capsule (60 mg total) by mouth daily.   famotidine (PEPCID) 20 MG tablet Take 1 tablet (20 mg total) by mouth daily at 6 (six) AM.   guaiFENesin (ROBITUSSIN) 100 MG/5ML liquid Take 200 mg by mouth every 4 (four) hours as needed for cough.   lipase/protease/amylase (CREON) 36000 UNITS CPEP capsule Give two caps with each meal for pancreatic insuff.   lipase/protease/amylase (CREON) 36000 UNITS CPEP capsule One daily with each snack   loperamide (IMODIUM A-D) 2 MG tablet Take by mouth. Give Immodium AD 4 mg p.o. after 1st loose stool as needed. (Physician Order)   loratadine (CLARITIN) 10 MG tablet Take 1 tablet (10 mg total) by mouth daily.   magnesium hydroxide (MILK OF MAGNESIA) 400 MG/5ML suspension Take by mouth daily as needed for mild constipation.   metoprolol tartrate (LOPRESSOR) 25 MG tablet Take 1 tablet (25 mg total) by mouth 2 (two) times daily.   NON FORMULARY Apply 1 each topically daily at 12 noon. Jobst Hose KN XL   NON FORMULARY 120 ml Medpass twice daily to help prevent malnutrition   Omeprazole 20 MG TBDD Take 1 tablet by mouth daily in the afternoon.   ondansetron (ZOFRAN) 4 MG tablet Take 1 tablet (4 mg total) by mouth every 4 (four) hours as needed for nausea or vomiting.   polyvinyl alcohol (LIQUIFILM TEARS) 1.4 % ophthalmic solution Place 1 drop into both eyes 4 (four) times daily as needed for dry eyes.   pregabalin (LYRICA) 75 MG capsule Take 1 capsule (75 mg total) by mouth 3 (three) times daily.   QUEtiapine  (SEROQUEL) 25 MG tablet Take 0.5 tablets (12.5 mg total) by mouth daily in the afternoon. For Behavior   QUEtiapine (SEROQUEL) 50 MG tablet Take 1.5 tablets (75 mg total) by mouth at bedtime.   Saccharomyces boulardii (PROBIOTIC) 250 MG CAPS Take 250 mg by mouth daily.   torsemide (DEMADEX) 10 MG tablet Take 1 tablet (10 mg total) by mouth daily.   traZODone (DESYREL) 50 MG tablet Take 0.5 tablets (25 mg total) by mouth at bedtime.   No facility-administered encounter medications on file as of 10/25/2020.    HOSPICE ELIGIBILITY/DIAGNOSIS: TBD  PAST MEDICAL HISTORY:  Past Medical History:  Diagnosis Date   Arthritis    Atrial fibrillation (HCC)    Blood transfusion without reported diagnosis    Depression    GAD (generalized anxiety disorder)    Hearing loss    Hyperlipidemia    Hypertension    Insomnia    Osteoarthritis    Pacemaker    PHN (postherpetic neuralgia) 2010   Systolic CHF (HCC)      Review lab tests/diagnostics  ALLERGIES: No Known Allergies    I spent 50 minutes providing this consultation; this includes time spent with patient/family, chart review and documentation. More than 50% of the time in this consultation was spent on counseling and coordinating communication   Thank you  for the opportunity to participate in the care of Deborah Chung Please call our office at [phone] if we can be of additional assistance.  Note: Portions of this note were generated with Scientist, clinical (histocompatibility and immunogenetics). Dictation errors may occur despite best attempts at proofreading.  Rosaura Carpenter, NP

## 2020-11-29 ENCOUNTER — Ambulatory Visit (INDEPENDENT_AMBULATORY_CARE_PROVIDER_SITE_OTHER): Payer: Medicare PPO

## 2020-11-29 DIAGNOSIS — I442 Atrioventricular block, complete: Secondary | ICD-10-CM | POA: Diagnosis not present

## 2020-11-29 LAB — CUP PACEART REMOTE DEVICE CHECK
Battery Remaining Longevity: 12 mo
Battery Remaining Percentage: 21 %
Brady Statistic RA Percent Paced: 78 %
Brady Statistic RV Percent Paced: 2 %
Date Time Interrogation Session: 20221116031200
Implantable Lead Implant Date: 20150112
Implantable Lead Implant Date: 20150112
Implantable Lead Location: 753859
Implantable Lead Location: 753860
Implantable Lead Model: 4135
Implantable Lead Model: 4136
Implantable Lead Serial Number: 29308444
Implantable Lead Serial Number: 29569556
Implantable Pulse Generator Implant Date: 20150112
Lead Channel Impedance Value: 535 Ohm
Lead Channel Impedance Value: 924 Ohm
Lead Channel Pacing Threshold Amplitude: 0.6 V
Lead Channel Pacing Threshold Amplitude: 0.9 V
Lead Channel Pacing Threshold Pulse Width: 0.4 ms
Lead Channel Pacing Threshold Pulse Width: 0.5 ms
Lead Channel Setting Pacing Amplitude: 1.1 V
Lead Channel Setting Pacing Amplitude: 2.4 V
Lead Channel Setting Pacing Pulse Width: 0.4 ms
Lead Channel Setting Sensing Sensitivity: 3 mV
Pulse Gen Serial Number: 391753

## 2020-12-06 NOTE — Progress Notes (Signed)
Remote pacemaker transmission.   

## 2021-02-04 ENCOUNTER — Inpatient Hospital Stay (HOSPITAL_COMMUNITY)
Admission: EM | Admit: 2021-02-04 | Discharge: 2021-02-09 | DRG: 689 | Disposition: A | Discharge status: Skilled Nursing Facility | Payer: Medicare Other | Attending: Internal Medicine | Admitting: Internal Medicine

## 2021-02-04 ENCOUNTER — Emergency Department (HOSPITAL_COMMUNITY): Payer: Medicare Other

## 2021-02-04 ENCOUNTER — Other Ambulatory Visit: Payer: Self-pay

## 2021-02-04 ENCOUNTER — Encounter (HOSPITAL_COMMUNITY): Payer: Self-pay | Admitting: Emergency Medicine

## 2021-02-04 DIAGNOSIS — G9341 Metabolic encephalopathy: Secondary | ICD-10-CM | POA: Diagnosis not present

## 2021-02-04 DIAGNOSIS — Z66 Do not resuscitate: Secondary | ICD-10-CM | POA: Diagnosis present

## 2021-02-04 DIAGNOSIS — F0394 Unspecified dementia, unspecified severity, with anxiety: Secondary | ICD-10-CM | POA: Diagnosis present

## 2021-02-04 DIAGNOSIS — N390 Urinary tract infection, site not specified: Secondary | ICD-10-CM | POA: Diagnosis present

## 2021-02-04 DIAGNOSIS — M1990 Unspecified osteoarthritis, unspecified site: Secondary | ICD-10-CM | POA: Diagnosis present

## 2021-02-04 DIAGNOSIS — F0393 Unspecified dementia, unspecified severity, with mood disturbance: Secondary | ICD-10-CM | POA: Diagnosis present

## 2021-02-04 DIAGNOSIS — F03918 Unspecified dementia, unspecified severity, with other behavioral disturbance: Secondary | ICD-10-CM | POA: Diagnosis present

## 2021-02-04 DIAGNOSIS — Z20822 Contact with and (suspected) exposure to covid-19: Secondary | ICD-10-CM | POA: Diagnosis present

## 2021-02-04 DIAGNOSIS — Z7901 Long term (current) use of anticoagulants: Secondary | ICD-10-CM

## 2021-02-04 DIAGNOSIS — I130 Hypertensive heart and chronic kidney disease with heart failure and stage 1 through stage 4 chronic kidney disease, or unspecified chronic kidney disease: Secondary | ICD-10-CM | POA: Diagnosis present

## 2021-02-04 DIAGNOSIS — R4182 Altered mental status, unspecified: Secondary | ICD-10-CM | POA: Diagnosis not present

## 2021-02-04 DIAGNOSIS — I5042 Chronic combined systolic (congestive) and diastolic (congestive) heart failure: Secondary | ICD-10-CM | POA: Diagnosis present

## 2021-02-04 DIAGNOSIS — E785 Hyperlipidemia, unspecified: Secondary | ICD-10-CM | POA: Diagnosis present

## 2021-02-04 DIAGNOSIS — F418 Other specified anxiety disorders: Secondary | ICD-10-CM | POA: Diagnosis not present

## 2021-02-04 DIAGNOSIS — F5101 Primary insomnia: Secondary | ICD-10-CM

## 2021-02-04 DIAGNOSIS — Z950 Presence of cardiac pacemaker: Secondary | ICD-10-CM | POA: Diagnosis not present

## 2021-02-04 DIAGNOSIS — Z79899 Other long term (current) drug therapy: Secondary | ICD-10-CM | POA: Diagnosis not present

## 2021-02-04 DIAGNOSIS — B964 Proteus (mirabilis) (morganii) as the cause of diseases classified elsewhere: Secondary | ICD-10-CM | POA: Diagnosis present

## 2021-02-04 DIAGNOSIS — H9190 Unspecified hearing loss, unspecified ear: Secondary | ICD-10-CM | POA: Diagnosis present

## 2021-02-04 DIAGNOSIS — I10 Essential (primary) hypertension: Secondary | ICD-10-CM | POA: Diagnosis not present

## 2021-02-04 DIAGNOSIS — Z7982 Long term (current) use of aspirin: Secondary | ICD-10-CM | POA: Diagnosis not present

## 2021-02-04 DIAGNOSIS — B0229 Other postherpetic nervous system involvement: Secondary | ICD-10-CM | POA: Diagnosis present

## 2021-02-04 DIAGNOSIS — I480 Paroxysmal atrial fibrillation: Secondary | ICD-10-CM | POA: Diagnosis present

## 2021-02-04 DIAGNOSIS — R410 Disorientation, unspecified: Secondary | ICD-10-CM

## 2021-02-04 DIAGNOSIS — G928 Other toxic encephalopathy: Secondary | ICD-10-CM | POA: Diagnosis present

## 2021-02-04 DIAGNOSIS — Z8249 Family history of ischemic heart disease and other diseases of the circulatory system: Secondary | ICD-10-CM | POA: Diagnosis not present

## 2021-02-04 DIAGNOSIS — Z96653 Presence of artificial knee joint, bilateral: Secondary | ICD-10-CM | POA: Diagnosis present

## 2021-02-04 DIAGNOSIS — R531 Weakness: Secondary | ICD-10-CM

## 2021-02-04 DIAGNOSIS — I4891 Unspecified atrial fibrillation: Secondary | ICD-10-CM | POA: Diagnosis present

## 2021-02-04 LAB — URINALYSIS, ROUTINE W REFLEX MICROSCOPIC
Bilirubin Urine: NEGATIVE
Glucose, UA: NEGATIVE mg/dL
Ketones, ur: NEGATIVE mg/dL
Nitrite: NEGATIVE
Protein, ur: 30 mg/dL — AB
Specific Gravity, Urine: 1.017 (ref 1.005–1.030)
WBC, UA: >50 WBC/hpf — ABNORMAL HIGH (ref 0–5)
pH: 6 (ref 5.0–8.0)

## 2021-02-04 LAB — COMPREHENSIVE METABOLIC PANEL
ALT: 11 U/L (ref 0–44)
AST: 15 U/L (ref 15–41)
Albumin: 3.8 g/dL (ref 3.5–5.0)
Alkaline Phosphatase: 118 U/L (ref 38–126)
Anion gap: 7 (ref 5–15)
BUN: 26 mg/dL — ABNORMAL HIGH (ref 8–23)
CO2: 26 mmol/L (ref 22–32)
Calcium: 8.7 mg/dL — ABNORMAL LOW (ref 8.9–10.3)
Chloride: 104 mmol/L (ref 98–111)
Creatinine, Ser: 1.26 mg/dL — ABNORMAL HIGH (ref 0.44–1.00)
GFR, Estimated: 40 mL/min — ABNORMAL LOW (ref >60)
Glucose, Bld: 108 mg/dL — ABNORMAL HIGH (ref 70–99)
Potassium: 4.3 mmol/L (ref 3.5–5.1)
Sodium: 137 mmol/L (ref 135–145)
Total Bilirubin: 0.8 mg/dL (ref 0.3–1.2)
Total Protein: 7.3 g/dL (ref 6.5–8.1)

## 2021-02-04 LAB — CBC WITH DIFFERENTIAL/PLATELET
Abs Immature Granulocytes: 0.02 10*3/uL (ref 0.00–0.07)
Basophils Absolute: 0 10*3/uL (ref 0.0–0.1)
Basophils Relative: 0 %
Eosinophils Absolute: 0.1 10*3/uL (ref 0.0–0.5)
Eosinophils Relative: 1 %
HCT: 47.8 % — ABNORMAL HIGH (ref 36.0–46.0)
Hemoglobin: 15.1 g/dL — ABNORMAL HIGH (ref 12.0–15.0)
Immature Granulocytes: 0 %
Lymphocytes Relative: 26 %
Lymphs Abs: 1.9 10*3/uL (ref 0.7–4.0)
MCH: 31.3 pg (ref 26.0–34.0)
MCHC: 31.6 g/dL (ref 30.0–36.0)
MCV: 99.2 fL (ref 80.0–100.0)
Monocytes Absolute: 1 10*3/uL (ref 0.1–1.0)
Monocytes Relative: 14 %
Neutro Abs: 4.3 10*3/uL (ref 1.7–7.7)
Neutrophils Relative %: 59 %
Platelets: 192 10*3/uL (ref 150–400)
RBC: 4.82 MIL/uL (ref 3.87–5.11)
RDW: 13.6 % (ref 11.5–15.5)
WBC: 7.4 10*3/uL (ref 4.0–10.5)
nRBC: 0 % (ref 0.0–0.2)

## 2021-02-04 LAB — TROPONIN I (HIGH SENSITIVITY)
Troponin I (High Sensitivity): 17 ng/L (ref <18)
Troponin I (High Sensitivity): 15 ng/L (ref <18)

## 2021-02-04 LAB — RESP PANEL BY RT-PCR (FLU A&B, COVID) ARPGX2
Influenza A by PCR: NEGATIVE
Influenza B by PCR: NEGATIVE
SARS Coronavirus 2 by RT PCR: NEGATIVE

## 2021-02-04 LAB — CBG MONITORING, ED: Glucose-Capillary: 99 mg/dL (ref 70–99)

## 2021-02-04 MED ORDER — NYSTATIN 100000 UNIT/GM EX POWD
1.0000 "application " | Freq: Two times a day (BID) | CUTANEOUS | Status: DC
  Administered 2021-02-04 – 2021-02-08 (×9): 1 via TOPICAL
  Filled 2021-02-04: qty 15

## 2021-02-04 MED ORDER — QUETIAPINE FUMARATE 50 MG PO TABS
75.0000 mg | ORAL_TABLET | Freq: Every day | ORAL | Status: DC
Start: 2021-02-04 — End: 2021-02-09
  Administered 2021-02-04 – 2021-02-08 (×5): 75 mg via ORAL
  Filled 2021-02-04 (×4): qty 2
  Filled 2021-02-04: qty 1
  Filled 2021-02-04: qty 2

## 2021-02-04 MED ORDER — POLYVINYL ALCOHOL 1.4 % OP SOLN
1.0000 [drp] | Freq: Four times a day (QID) | OPHTHALMIC | Status: DC | PRN
  Filled 2021-02-04: qty 15

## 2021-02-04 MED ORDER — DIPHENOXYLATE-ATROPINE 2.5-0.025 MG PO TABS: 1.0000 | ORAL_TABLET | ORAL | Status: DC | PRN

## 2021-02-04 MED ORDER — SODIUM CHLORIDE 0.9 % IV BOLUS
500.0000 mL | Freq: Once | INTRAVENOUS | Status: AC
  Administered 2021-02-04: 500 mL via INTRAVENOUS

## 2021-02-04 MED ORDER — ALPRAZOLAM 0.25 MG PO TABS: 0.2500 mg | ORAL_TABLET | Freq: Every day | ORAL | Status: DC | PRN

## 2021-02-04 MED ORDER — SODIUM CHLORIDE 0.9% FLUSH
3.0000 mL | Freq: Two times a day (BID) | INTRAVENOUS | Status: DC
  Administered 2021-02-04 – 2021-02-09 (×9): 3 mL via INTRAVENOUS

## 2021-02-04 MED ORDER — ASPIRIN 81 MG PO CHEW
81.0000 mg | CHEWABLE_TABLET | Freq: Every day | ORAL | Status: DC
  Administered 2021-02-05 – 2021-02-08 (×4): 81 mg via ORAL
  Filled 2021-02-04 (×5): qty 1

## 2021-02-04 MED ORDER — SODIUM CHLORIDE 0.9 % IV SOLN
2.0000 g | INTRAVENOUS | Status: DC
  Administered 2021-02-04 – 2021-02-05 (×2): 2 g via INTRAVENOUS
  Filled 2021-02-04 (×2): qty 20

## 2021-02-04 MED ORDER — METOPROLOL TARTRATE 25 MG PO TABS
25.0000 mg | ORAL_TABLET | Freq: Two times a day (BID) | ORAL | Status: DC
Start: 2021-02-04 — End: 2021-02-09
  Administered 2021-02-04 – 2021-02-08 (×9): 25 mg via ORAL
  Filled 2021-02-04 (×10): qty 1

## 2021-02-04 MED ORDER — GUAIFENESIN 100 MG/5ML PO LIQD
200.0000 mg | ORAL | Status: DC | PRN
  Filled 2021-02-04: qty 10

## 2021-02-04 MED ORDER — ACETAMINOPHEN 650 MG RE SUPP: 650.0000 mg | Freq: Four times a day (QID) | RECTAL | Status: DC | PRN

## 2021-02-04 MED ORDER — PANTOPRAZOLE SODIUM 40 MG PO TBEC
40.0000 mg | DELAYED_RELEASE_TABLET | Freq: Every day | ORAL | Status: DC
  Administered 2021-02-05 – 2021-02-08 (×4): 40 mg via ORAL
  Filled 2021-02-04 (×5): qty 1

## 2021-02-04 MED ORDER — ACETAMINOPHEN 325 MG PO TABS
650.0000 mg | ORAL_TABLET | Freq: Four times a day (QID) | ORAL | Status: DC | PRN
  Administered 2021-02-08: 650 mg via ORAL
  Filled 2021-02-04: qty 2

## 2021-02-04 MED ORDER — ALBUTEROL SULFATE (2.5 MG/3ML) 0.083% IN NEBU: 2.5000 mg | INHALATION_SOLUTION | Freq: Four times a day (QID) | RESPIRATORY_TRACT | Status: DC | PRN

## 2021-02-04 MED ORDER — PANCRELIPASE (LIP-PROT-AMYL) 36000-114000 UNITS PO CPEP: 36000.0000 [IU] | ORAL_CAPSULE | Freq: Every day | ORAL | Status: DC | PRN

## 2021-02-04 MED ORDER — PANCRELIPASE (LIP-PROT-AMYL) 36000-114000 UNITS PO CPEP
36000.0000 [IU] | ORAL_CAPSULE | Freq: Three times a day (TID) | ORAL | Status: DC
  Administered 2021-02-05 – 2021-02-09 (×9): 36000 [IU] via ORAL
  Filled 2021-02-04 (×11): qty 1

## 2021-02-04 MED ORDER — SACCHAROMYCES BOULARDII 250 MG PO CAPS
250.0000 mg | ORAL_CAPSULE | Freq: Every day | ORAL | Status: DC
  Administered 2021-02-05 – 2021-02-08 (×4): 250 mg via ORAL
  Filled 2021-02-04 (×6): qty 1

## 2021-02-04 MED ORDER — LORATADINE 10 MG PO TABS
10.0000 mg | ORAL_TABLET | Freq: Every day | ORAL | Status: DC
  Administered 2021-02-05 – 2021-02-08 (×4): 10 mg via ORAL
  Filled 2021-02-04 (×5): qty 1

## 2021-02-04 MED ORDER — CALCIUM CARBONATE ANTACID 500 MG PO CHEW: 1.0000 | CHEWABLE_TABLET | Freq: Two times a day (BID) | ORAL | Status: DC | PRN

## 2021-02-04 MED ORDER — SODIUM CHLORIDE 0.9 % IV SOLN: INTRAVENOUS | Status: DC

## 2021-02-04 MED ORDER — DULOXETINE HCL 60 MG PO CPEP
60.0000 mg | ORAL_CAPSULE | Freq: Every day | ORAL | Status: DC
  Administered 2021-02-05 – 2021-02-08 (×4): 60 mg via ORAL
  Filled 2021-02-04 (×6): qty 1

## 2021-02-04 MED ORDER — TRAZODONE HCL 50 MG PO TABS
25.0000 mg | ORAL_TABLET | Freq: Every evening | ORAL | Status: DC | PRN
  Administered 2021-02-05 – 2021-02-06 (×2): 25 mg via ORAL
  Filled 2021-02-04 (×2): qty 1

## 2021-02-04 MED ORDER — APIXABAN 2.5 MG PO TABS
2.5000 mg | ORAL_TABLET | Freq: Two times a day (BID) | ORAL | Status: DC
  Administered 2021-02-04 – 2021-02-08 (×9): 2.5 mg via ORAL
  Filled 2021-02-04 (×11): qty 1

## 2021-02-04 NOTE — ED Notes (Signed)
Pt NAD in bed, eyes closed. Pt arrousable to touch. A/ox2, answers unintelligently and mumbles answers. Unable to get thorough HX or assessment from pt. ABD soft nontender. VSS.

## 2021-02-04 NOTE — ED Notes (Signed)
Pt not able to fully follow commands or keep eyes open, oral meds held at this time.

## 2021-02-04 NOTE — ED Provider Triage Note (Signed)
Emergency Medicine Provider Triage Evaluation Note  Deborah Chung , a 88 y.o. female  was evaluated in triage.  Pt complains of brought in by EMS for patient being less verbal than usual and generalized weakness.   Review of Systems  Positive:  Negative:   Physical Exam  BP 119/68 (BP Location: Left Arm)    Pulse 70    Temp 98.2 F (36.8 C) (Oral)    Resp 18    SpO2 95%  Gen:   Patient is nonverbal. Resp:  Normal effort  MSK:   Patient is able to follow commands and to squeeze my hands bilaterally. Other:  When I ask patient if she can open her eyes she shakes her head no.  She actively resists attempts to open her eyes.  Pupils are not pinpoint. Patient reacts to CBG check and loud noises.  Medical Decision Making  Medically screening exam initiated at 10:18 AM.  Appropriate orders placed.  Deborah Chung was informed that the remainder of the evaluation will be completed by another provider, this initial triage assessment does not replace that evaluation, and the importance of remaining in the ED until their evaluation is complete.   labs, CT head are ordered.  Patient is able to follow commands and actively resists my attempts to open her eyes.    Cristina Gong, [place] 02/04/21 1021

## 2021-02-04 NOTE — ED Notes (Signed)
Attempted to contact both patient contacts, no answer from either.

## 2021-02-04 NOTE — ED Triage Notes (Signed)
Patient BIB [facility] from nursing facility, facility called d/t pt not speaking and gen weakness. With EMS patient speaking to baseline, does appear generally weak. VSS.

## 2021-02-04 NOTE — ED Provider Notes (Signed)
[facility] EMERGENCY DEPARTMENT Provider Note   CSN: [id number] Arrival date & time: 02/04/21  0957     History  Chief Complaint  Patient presents with   Weakness   Fatigue    Deborah Chung is a 88 y.o. female.  HPI She presents [place] for evaluation of general weakness, altered mental status and speech disorder.  She lives in the nursing home.  She came here by EMS because staff there was worried about her.  Son is in the room on my arrival at 10:09 AM.  Patient is alert communicative she has garbled speech.  Son sets her speech is "a little bit worse than usual,".  She typically has somewhat difficult to understand speech which waxes wanes according to the son.    Home Medications Prior to Admission medications   Medication Sig Start Date End Date Taking? Authorizing Provider  acetaminophen (TYLENOL) 325 MG tablet Take 650 mg by mouth 2 (two) times daily as needed (pain).    [provider]  apixaban (ELIQUIS) 2.5 MG TABS tablet Take 1 tablet (2.5 mg total) by mouth 2 (two) times daily. 05/09/20   Medina-Vargas, Monina C, NP  aspirin 81 MG chewable tablet Chew 81 mg by mouth daily.    [provider]  Biotin 10000 MCG TABS Take 10,000 mcg by mouth daily. 05/09/20   Medina-Vargas, Monina C, NP  bisacodyl (DULCOLAX) 10 MG suppository Place 10 mg rectally as needed for moderate constipation.    [provider]  calcium carbonate (TUMS) 500 MG chewable tablet Chew 1 tablet (200 mg of elemental calcium total) by mouth 2 (two) times daily as needed for indigestion or heartburn. 06/03/19   Janith Lima, MD  diltiazem (CARDIZEM) 60 MG tablet Take 1 tablet (60 mg total) by mouth 2 (two) times daily. 05/09/20   Medina-Vargas, Monina C, NP  DULoxetine (CYMBALTA) 60 MG capsule Take 1 capsule (60 mg total) by mouth daily. 05/09/20   Medina-Vargas, Monina C, NP  famotidine (PEPCID) 20 MG tablet Take 1 tablet (20 mg total) by mouth daily at 6 (six) AM.  05/09/20   Medina-Vargas, Monina C, NP  guaiFENesin (ROBITUSSIN) 100 MG/5ML liquid Take 200 mg by mouth every 4 (four) hours as needed for cough.    [provider]  lipase/protease/amylase (CREON) 36000 UNITS CPEP capsule Give two caps with each meal for pancreatic insuff. 05/09/20   Medina-Vargas, Monina C, NP  lipase/protease/amylase (CREON) 36000 UNITS CPEP capsule One daily with each snack 05/09/20   Medina-Vargas, Monina C, NP  loperamide (IMODIUM A-D) 2 MG tablet Take by mouth. Give Immodium AD 4 mg p.o. after 1st loose stool as needed. (Physician Order)    [provider]  loratadine (CLARITIN) 10 MG tablet Take 1 tablet (10 mg total) by mouth daily. 05/09/20   Medina-Vargas, Monina C, NP  magnesium hydroxide (MILK OF MAGNESIA) 400 MG/5ML suspension Take by mouth daily as needed for mild constipation.    [provider]  metoprolol tartrate (LOPRESSOR) 25 MG tablet Take 1 tablet (25 mg total) by mouth 2 (two) times daily. 05/09/20   Medina-Vargas, Monina C, NP  NON FORMULARY Apply 1 each topically daily at 12 noon. Jobst Hose KN XL    [provider]  NON FORMULARY 120 ml Medpass twice daily to help prevent malnutrition    [provider]  Omeprazole 20 MG TBDD Take 1 tablet by mouth daily in the afternoon. 05/09/20   Medina-Vargas, Monina C, NP  ondansetron (  ZOFRAN) 4 MG tablet Take 1 tablet (4 mg total) by mouth every 4 (four) hours as needed for nausea or vomiting. 05/09/20   Medina-Vargas, Monina C, NP  polyvinyl alcohol (LIQUIFILM TEARS) 1.4 % ophthalmic solution Place 1 drop into both eyes 4 (four) times daily as needed for dry eyes. 05/09/20   Medina-Vargas, Monina C, NP  pregabalin (LYRICA) 75 MG capsule Take 1 capsule (75 mg total) by mouth 3 (three) times daily. 05/09/20   Medina-Vargas, Monina C, NP  QUEtiapine (SEROQUEL) 25 MG tablet Take 0.5 tablets (12.5 mg total) by mouth daily in the afternoon. For Behavior 05/09/20   Medina-Vargas, Monina C,  NP  QUEtiapine (SEROQUEL) 50 MG tablet Take 1.5 tablets (75 mg total) by mouth at bedtime. 05/09/20   Medina-Vargas, Monina C, NP  Saccharomyces boulardii (PROBIOTIC) 250 MG CAPS Take 250 mg by mouth daily. 05/09/20   Medina-Vargas, Monina C, NP  torsemide (DEMADEX) 10 MG tablet Take 1 tablet (10 mg total) by mouth daily. 05/09/20   Medina-Vargas, Monina C, NP  traZODone (DESYREL) 50 MG tablet Take 0.5 tablets (25 mg total) by mouth at bedtime. 05/09/20   Medina-Vargas, Monina C, NP      Allergies    Patient has no known allergies.    Review of Systems   Review of Systems  All other systems reviewed and are negative.  Physical Exam Updated Vital Signs BP 104/84    Pulse 70    Temp 98.2 F (36.8 C) (Oral)    Resp 14    SpO2 93%  Physical Exam Vitals and nursing note reviewed.  Constitutional:      Appearance: She is well-developed. She is not ill-appearing.  HENT:     Head: Normocephalic and atraumatic.     Right Ear: External ear normal.     Left Ear: External ear normal.     Nose: No congestion or rhinorrhea.     Mouth/Throat:     Pharynx: No oropharyngeal exudate or posterior oropharyngeal erythema.  Eyes:     Conjunctiva/sclera: Conjunctivae normal.     Pupils: Pupils are equal, round, and reactive to light.  Neck:     Trachea: Phonation normal.  Cardiovascular:     Rate and Rhythm: Normal rate and regular rhythm.     Heart sounds: Normal heart sounds.  Pulmonary:     Effort: Pulmonary effort is normal.     Breath sounds: Normal breath sounds.  Abdominal:     General: There is no distension.     Palpations: Abdomen is soft.     Tenderness: There is no abdominal tenderness.  Musculoskeletal:        General: Normal range of motion.     Cervical back: Normal range of motion and neck supple.  Skin:    General: Skin is warm and dry.  Neurological:     Mental Status: She is alert.     Cranial Nerves: No cranial nerve deficit.     Sensory: No sensory deficit.     Motor:  No abnormal muscle tone.     Coordination: Coordination normal.     Comments: Nonspecific dysarthria.  No aphasia.  She is responsive, answering questions about her welfare easily.  She is confused.  No ataxia  Psychiatric:        Mood and Affect: Mood normal.        Behavior: Behavior normal.    ED Results / Procedures / Treatments   Labs (all labs ordered are listed, but only  abnormal results are displayed) Labs Reviewed  COMPREHENSIVE METABOLIC PANEL - Abnormal; Notable for the following components:      Result Value   Glucose, Bld 108 (*)    BUN 26 (*)    Creatinine, Ser 1.26 (*)    Calcium 8.7 (*)    GFR, Estimated 40 (*)    All other components within normal limits  CBC WITH DIFFERENTIAL/PLATELET - Abnormal; Notable for the following components:   Hemoglobin 15.1 (*)    HCT 47.8 (*)    All other components within normal limits  URINALYSIS, ROUTINE W REFLEX MICROSCOPIC - Abnormal; Notable for the following components:   APPearance HAZY (*)    Hgb urine dipstick SMALL (*)    Protein, ur 30 (*)    Leukocytes,Ua LARGE (*)    WBC, UA >50 (*)    Bacteria, UA MANY (*)    All other components within normal limits  RESP PANEL BY RT-PCR (FLU A&B, COVID) ARPGX2  CBG MONITORING, ED  TROPONIN I (HIGH SENSITIVITY)  TROPONIN I (HIGH SENSITIVITY)    EKG EKG Interpretation  Date/Time:  Sunday February 04 2021 10:08:20 EST Ventricular Rate:  70 PR Interval:  214 QRS Duration: 84 QT Interval:  396 QTC Calculation: 427 R Axis:   65 Text Interpretation: Atrial-paced rhythm with prolonged AV conduction Nonspecific ST abnormality Abnormal ECG When compared with ECG of 10-Apr-2020 08:10, PREVIOUS ECG IS PRESENT since last tracing no significant change Confirmed by Daleen Bo [phone]) on 02/04/2021 3:06:04 PM  Radiology DG Chest 2 View  Result Date: 02/04/2021 CLINICAL DATA:  Altered mental status EXAM: CHEST - 2 VIEW COMPARISON:  Chest x-ray 04/07/2020 FINDINGS: Heart is enlarged.  Mediastinum appears stable. Calcified plaques in the aortic arch. Left-sided cardiac device. Pulmonary vasculature is within normal limits. No focal consolidation, pleural effusion, or pneumothorax identified. IMPRESSION: Cardiomegaly with no acute process identified. Electronically Signed   By: Ofilia Neas M.D.   On: 02/04/2021 12:10   CT HEAD WO CONTRAST (5MM)  Result Date: 02/04/2021 CLINICAL DATA:  Mental status change EXAM: CT HEAD WITHOUT CONTRAST TECHNIQUE: Contiguous axial images were obtained from the base of the skull through the vertex without intravenous contrast. RADIATION DOSE REDUCTION: This exam was performed according to the departmental dose-optimization program which includes automated exposure control, adjustment of the mA and/or kV according to patient size and/or use of iterative reconstruction technique. COMPARISON:  CT head 04/07/2020 FINDINGS: Brain: Mild generalized atrophy. Moderate white matter hypodensity bilaterally, chronic and unchanged Negative for acute infarct, hemorrhage, or mass. Vascular: Negative for hyperdense vessel Skull: Negative Sinuses/Orbits: Mild mucosal edema right maxillary sinus. Remaining sinuses clear. Bilateral cataract extraction Other: None IMPRESSION: No acute abnormality. Atrophy and chronic microvascular ischemic changes the white matter. No change from the prior CT. Electronically Signed   By: Franchot Gallo M.D.   On: 02/04/2021 11:24    Procedures .Critical Care Performed by: Daleen Bo, MD Authorized by: Daleen Bo, MD   Critical care provider statement:    Critical care time (minutes):  35   Critical care start time:  02/04/2021 11:14 AM   Critical care end time:  02/04/2021 3:54 PM   Critical care time was exclusive of:  Separately billable procedures and treating other patients   Critical care was necessary to treat or prevent imminent or life-threatening deterioration of the following conditions:  Sepsis and CNS failure or  compromise   Critical care was time spent personally by me on the following activities:  Blood draw  for specimens, development of treatment plan with patient or surrogate, discussions with consultants, evaluation of patient's response to treatment, examination of patient, ordering and performing treatments and interventions, ordering and review of laboratory studies, ordering and review of radiographic studies, pulse oximetry, re-evaluation of patient's condition and review of old charts    Medications Ordered in ED Medications  0.9 %  sodium chloride infusion ( Intravenous New Bag/Given 02/04/21 1511)  sodium chloride 0.9 % bolus 500 mL (500 mLs Intravenous New Bag/Given 02/04/21 1510)    ED Course/ Medical Decision Making/ A&P                           Medical Decision Making Amount and/or Complexity of Data Reviewed Labs: ordered. Radiology: ordered.    Patient Vitals for the past 24 hrs:  BP Temp Temp src Pulse Resp SpO2  02/04/21 1500 104/84 -- -- 70 14 93 %  02/04/21 1445 114/70 -- -- 72 11 94 %  02/04/21 1430 115/81 -- -- 69 10 93 %  02/04/21 1400 (!) 129/57 -- -- 71 13 98 %  02/04/21 1330 111/62 -- -- 70 11 94 %  02/04/21 1315 (!) 107/55 -- -- 70 11 94 %  02/04/21 1245 108/60 -- -- 69 13 92 %  02/04/21 1230 126/63 -- -- 71 10 96 %  02/04/21 1215 122/69 -- -- 70 17 97 %  02/04/21 1119 117/86 -- -- 71 18 93 %  02/04/21 1008 119/68 98.2 F (36.8 C) Oral 70 18 95 %    Medical Decision Making: Summary of Illness/Injury: Advanced elderly patient presenting for evaluation of confusion and weakness of nonspecific nature.  Differential for etiology is wide, and she is unable to give history.  Son is with her and feels like she is different than usual, but he cannot specify anything else beyond that.  Critical Interventions-good evaluation, laboratory testing, radiography, observation and reassessment; to evaluate  Chief Complaint  Patient presents with   Weakness   Fatigue     and assess for illness characterized as Acute, Previously Undiagnosed, Uncertain Prognosis, Complicated, and Systemic Symptoms   The Differential Diagnoses include UTI, metabolic disorders, intravascular volume disorders, sepsis, malaise, dementia.  I obtained  Additional Historical Information from [place] son at the bedside , as the patient is confused.  I decided to review pertinent External Data, and in summary ongoing management by cardiology for complete heart block, encounters noted.  Recent palliative care consultation to confirm DNR status.  End-of-life care documents in the EMR were reviewed   Clinical Laboratory Tests Ordered, included CBC, Metabolic panel, Urinalysis, and troponin I, viral panel . Review indicates abnormal urinalysis consistent with infection.  Creatinine elevated, BUN high, glucose high, calcium low. Emergent testing abnormality management required for stabilization-UTI requires treatment  Radiologic Tests Ordered, included chest x-ray, CT head.  I independently Visualized: Radiograph images, which show no CVA, brain tumor, heart failure or pneumonia.  Images reviewed by me.  Cardiac Monitor Tracing which shows atrial pacing, indicating chronic condition    This patient is Presenting for Evaluation of weakness, difficulty talking, change in behavior and change in ability to care for self.  She lives in an assisted living facility, which does require a range of treatment options, and is a complaint that involves a moderate risk of morbidity and mortality.  Pharmaceutical Risk Management yes Prescription Management    Treatment Complication Risk Evaluation indicates appropriate disposition isHospitalization  After These Interventions,  the Patient was reevaluated and was found with signs and symptoms of UTI leading to general weakness and confusion.  Patient has DNR status, with recent palliative consultation in October 2022.  She is living in an  assisted living facility and is decompensated at this time.  She has had a gradual decrease in her ability to care for self and the son is currently contemplating placement in a skilled nursing facility.  He has begun that investigation.  Discussion of Management or Test Interpretation With External Provider(s): Consultation hospitalist service for admission to manage weakness, and confusion. Patient is at risk for decompensation if she returns to her assisted living facility, and needs more nursing care at this time.    3:09 PM Reevaluation with update and discussion with son. After initial assessment and treatment, an updated evaluation reveals he is concerned that she is too sick to go back to her assisted living facility. Illness risk, inability to care for self, discussed. Daleen Bo    3:09 PM-Consult complete with hospitalist. Patient case explained and discussed. Requirement for hospitalization to prevent decompensation agreed on. Possible Risk of sepsis and metabolic disorders.  He agrees to admit patient for further evaluation and treatment. Call ended at 3:40 PM  CRITICAL CARE-yes Performed by: Daleen Bo              Final Clinical Impression(s) / ED Diagnoses Final diagnoses:  Urinary tract infection without hematuria, site unspecified  Weakness  Confusion    Rx / DC Orders ED Discharge Orders     None         Daleen Bo, MD 02/04/21 1555

## 2021-02-04 NOTE — H&P (Signed)
History and Physical    Deborah Chung [id number] DOB: 19-Nov-1926 DOA: 02/04/2021  Referring MD/NP/PA: Christ Kick, MD PCP: Janith Lima, MD  Patient coming from: [facility] assisted living  Chief Complaint: Weakness and less responsive  I have personally briefly reviewed patient's old medical records in [place]   HPI: Deborah Chung is a 88 y.o. female with medical history significant of hypertension, hyperlipidemia, systolic CHF, frequent UTIs, and dementia who presents after being found to have increased weakness and due to the patient being less responsive.  History is obtained from the patient's son who is present at bedside as the patient has dementia.  At baseline the understandability of her speech reportedly fluctuates and she had been previously able to help assist with transfers.  Over the last couple of months patient has been less able to help with transfers needing more assistance.  The patient is able to recognize her son and other family members.  She had been placed on Seroquel due to intermittent combativeness  with staff members.  Son agrees patient's speech was more difficult to understand when she first came into the hospital than normal.  He reports that she had similar symptoms like this back in 03/2020 that were attributed to a urinary tract infection that improved with treatment.  After that hospitalization she had to go to rehab due to significant weakness thereafter.  He had already been looking into skilled nursing facilities as it was thought that she was getting to the point where she could no longer live in assisted living.  He had like a place called [facility], but notes that they had no beds available as of last week.  She currently denies any complaints.  Son notes that she chronically has lower extremity swelling.   ED Course: Upon admission into the emergency department patient was in to be afebrile with stable vital signs.  Labs note hemoglobin  15.1, BUN 26, creatinine 1.26,  calcium 8.7, and high-sensitivity troponins negative x2.  CT scan of the brain showed no acute abnormalities.  Urinalysis was positive for large leukocytes many bacteria, and greater than 50 WBCs.  Influenza and COVID-19 screening were negative.  Chest x-ray noted cardiomegaly without acute process.  Patient had been given 500 mL normal saline IV fluid bolus.  Review of Systems  Unable to perform ROS: Mental status change   Past Medical History:  Diagnosis Date   Arthritis    Atrial fibrillation ([place])    Blood transfusion without reported diagnosis    Depression    GAD (generalized anxiety disorder)    Hearing loss    Hyperlipidemia    Hypertension    Insomnia    Osteoarthritis    Pacemaker    PHN (postherpetic neuralgia) [id number]   Systolic CHF ([place])     Past Surgical History:  Procedure Laterality Date   ABDOMINAL HYSTERECTOMY     PACEMAKER PLACEMENT     TOTAL KNEE ARTHROPLASTY Bilateral      reports that she has never smoked. She has never used smokeless tobacco. She reports that she does not currently use alcohol. She reports that she does not use drugs.  No Known Allergies  Family History  Problem Relation Age of Onset   Heart disease Father    Heart disease Sister     Prior to Admission medications   Medication Sig Start Date End Date Taking? Authorizing Provider  acetaminophen (TYLENOL) 325 MG tablet Take 650 mg by mouth 2 (two) times daily as needed (  pain).    [provider]  apixaban (ELIQUIS) 2.5 MG TABS tablet Take 1 tablet (2.5 mg total) by mouth 2 (two) times daily. 05/09/20   Medina-Vargas, Monina C, NP  aspirin 81 MG chewable tablet Chew 81 mg by mouth daily.    [provider]  Biotin 10000 MCG TABS Take 10,000 mcg by mouth daily. 05/09/20   Medina-Vargas, Monina C, NP  bisacodyl (DULCOLAX) 10 MG suppository Place 10 mg rectally as needed for moderate constipation.    [provider]  calcium carbonate  (TUMS) 500 MG chewable tablet Chew 1 tablet (200 mg of elemental calcium total) by mouth 2 (two) times daily as needed for indigestion or heartburn. 06/03/19   Janith Lima, MD  diltiazem (CARDIZEM) 60 MG tablet Take 1 tablet (60 mg total) by mouth 2 (two) times daily. 05/09/20   Medina-Vargas, Monina C, NP  DULoxetine (CYMBALTA) 60 MG capsule Take 1 capsule (60 mg total) by mouth daily. 05/09/20   Medina-Vargas, Monina C, NP  famotidine (PEPCID) 20 MG tablet Take 1 tablet (20 mg total) by mouth daily at 6 (six) AM. 05/09/20   Medina-Vargas, Monina C, NP  guaiFENesin (ROBITUSSIN) 100 MG/5ML liquid Take 200 mg by mouth every 4 (four) hours as needed for cough.    [provider]  lipase/protease/amylase (CREON) 36000 UNITS CPEP capsule Give two caps with each meal for pancreatic insuff. 05/09/20   Medina-Vargas, Monina C, NP  lipase/protease/amylase (CREON) 36000 UNITS CPEP capsule One daily with each snack 05/09/20   Medina-Vargas, Monina C, NP  loperamide (IMODIUM A-D) 2 MG tablet Take by mouth. Give Immodium AD 4 mg p.o. after 1st loose stool as needed. (Physician Order)    [provider]  loratadine (CLARITIN) 10 MG tablet Take 1 tablet (10 mg total) by mouth daily. 05/09/20   Medina-Vargas, Monina C, NP  magnesium hydroxide (MILK OF MAGNESIA) 400 MG/5ML suspension Take by mouth daily as needed for mild constipation.    [provider]  metoprolol tartrate (LOPRESSOR) 25 MG tablet Take 1 tablet (25 mg total) by mouth 2 (two) times daily. 05/09/20   Medina-Vargas, Monina C, NP  NON FORMULARY Apply 1 each topically daily at 12 noon. Jobst Hose KN XL    [provider]  NON FORMULARY 120 ml Medpass twice daily to help prevent malnutrition    [provider]  Omeprazole 20 MG TBDD Take 1 tablet by mouth daily in the afternoon. 05/09/20   Medina-Vargas, Monina C, NP  ondansetron (ZOFRAN) 4 MG tablet Take 1 tablet (4 mg total) by mouth every 4 (four) hours as needed  for nausea or vomiting. 05/09/20   Medina-Vargas, Monina C, NP  polyvinyl alcohol (LIQUIFILM TEARS) 1.4 % ophthalmic solution Place 1 drop into both eyes 4 (four) times daily as needed for dry eyes. 05/09/20   Medina-Vargas, Monina C, NP  pregabalin (LYRICA) 75 MG capsule Take 1 capsule (75 mg total) by mouth 3 (three) times daily. 05/09/20   Medina-Vargas, Monina C, NP  QUEtiapine (SEROQUEL) 25 MG tablet Take 0.5 tablets (12.5 mg total) by mouth daily in the afternoon. For Behavior 05/09/20   Medina-Vargas, Monina C, NP  QUEtiapine (SEROQUEL) 50 MG tablet Take 1.5 tablets (75 mg total) by mouth at bedtime. 05/09/20   Medina-Vargas, Monina C, NP  Saccharomyces boulardii (PROBIOTIC) 250 MG CAPS Take 250 mg by mouth daily. 05/09/20   Medina-Vargas, Monina C, NP  torsemide (DEMADEX) 10 MG tablet Take 1 tablet (10 mg total) by  mouth daily. 05/09/20   Medina-Vargas, Monina C, NP  traZODone (DESYREL) 50 MG tablet Take 0.5 tablets (25 mg total) by mouth at bedtime. 05/09/20   Medina-Vargas, Senaida Lange, NP    Physical Exam:  Constitutional: Elderly female who is currently resting but we will awaken to verbal stim Vitals:   02/04/21 1430 02/04/21 1445 02/04/21 1500 02/04/21 1530  BP: 115/81 114/70 104/84 131/62  Pulse: 69 72 70 70  Resp: 10 11 14 13   Temp:      TempSrc:      SpO2: 93% 94% 93% 98%   Eyes: PERRL, lids and conjunctivae normal ENMT: Mucous membranes are moist. Posterior pharynx clear of any exudate or lesions.  Neck: normal, supple, no masses, no thyromegaly Respiratory: clear to auscultation bilaterally, no wheezing, no crackles. Normal respiratory effort. No accessory muscle use.  Cardiovascular: Regular rate and rhythm, no murmurs / rubs / gallops.  Trace pitting lower extremity edema. 2+ pedal pulses. No carotid bruits.  Abdomen: no tenderness.Bowel sounds positive.  Musculoskeletal: no clubbing / cyanosis. No joint deformity upper and lower extremities.  Skin: no rashes, lesions, ulcers.  No induration Neurologic: CN 2-12 grossly intact.  Extremities Psychiatric: Lethargic clinically oriented to person at this time.    Labs on Admission: I have personally reviewed following labs and imaging studies  CBC: Recent Labs  Lab 02/04/21 1023  WBC 7.4  NEUTROABS 4.3  HGB 15.1*  HCT 47.8*  MCV 99.2  PLT [id number]   Basic Metabolic Panel: Recent Labs  Lab 02/04/21 1023  NA 137  K 4.3  CL 104  CO2 26  GLUCOSE 108*  BUN 26*  CREATININE 1.26*  CALCIUM 8.7*   GFR: CrCl cannot be calculated (Unknown ideal weight.). Liver Function Tests: Recent Labs  Lab 02/04/21 1023  AST 15  ALT 11  ALKPHOS 118  BILITOT 0.8  PROT 7.3  ALBUMIN 3.8   No results for input(s): LIPASE, AMYLASE in the last 168 hours. No results for input(s): AMMONIA in the last 168 hours. Coagulation Profile: No results for input(s): INR, PROTIME in the last 168 hours. Cardiac Enzymes: No results for input(s): CKTOTAL, CKMB, CKMBINDEX, TROPONINI in the last 168 hours. BNP (last 3 results) No results for input(s): PROBNP in the last 8760 hours. HbA1C: No results for input(s): HGBA1C in the last 72 hours. CBG: Recent Labs  Lab 02/04/21 1013  GLUCAP 99   Lipid Profile: No results for input(s): CHOL, HDL, LDLCALC, TRIG, CHOLHDL, LDLDIRECT in the last 72 hours. Thyroid Function Tests: No results for input(s): TSH, T4TOTAL, FREET4, T3FREE, THYROIDAB in the last 72 hours. Anemia Panel: No results for input(s): VITAMINB12, FOLATE, FERRITIN, TIBC, IRON, RETICCTPCT in the last 72 hours. Urine analysis:    Component Value Date/Time   COLORURINE YELLOW 02/04/2021 1120   APPEARANCEUR HAZY (A) 02/04/2021 1120   LABSPEC 1.017 02/04/2021 1120   PHURINE 6.0 02/04/2021 1120   GLUCOSEU NEGATIVE 02/04/2021 1120   GLUCOSEU NEGATIVE 06/24/2017 1459   HGBUR SMALL (A) 02/04/2021 1120   BILIRUBINUR NEGATIVE 02/04/2021 1120   BILIRUBINUR neg 05/16/2014 1421   KETONESUR NEGATIVE 02/04/2021 1120   PROTEINUR  30 (A) 02/04/2021 1120   UROBILINOGEN 0.2 06/24/2017 1459   NITRITE NEGATIVE 02/04/2021 1120   LEUKOCYTESUR LARGE (A) 02/04/2021 1120   Sepsis Labs: Recent Results (from the past 240 hour(s))  Resp Panel by RT-PCR (Flu A&B, Covid) Urine, In & Out Cath     Status: None   Collection Time: 02/04/21 12:46 PM  Specimen: Urine, In & Out Cath; Nasopharyngeal(NP) swabs in vial transport medium  Result Value Ref Range Status   SARS Coronavirus 2 by RT PCR NEGATIVE NEGATIVE Final    Comment: (NOTE) SARS-CoV-2 target nucleic acids are NOT DETECTED.  The SARS-CoV-2 RNA is generally detectable in upper respiratory specimens during the acute phase of infection. The lowest concentration of SARS-CoV-2 viral copies this assay can detect is 138 copies/mL. A negative result does not preclude SARS-Cov-2 infection and should not be used as the sole basis for treatment or other patient management decisions. A negative result may occur with  improper specimen collection/handling, submission of specimen other than nasopharyngeal swab, presence of viral mutation(s) within the areas targeted by this assay, and inadequate number of viral copies(<138 copies/mL). A negative result must be combined with clinical observations, patient history, and epidemiological information. The expected result is Negative.  Fact Sheet for Patients:  EntrepreneurPulse.com.au  Fact Sheet for Healthcare Providers:  IncredibleEmployment.be  This test is no t yet approved or cleared by the [place] FDA and  has been authorized for detection and/or diagnosis of SARS-CoV-2 by FDA under an Emergency Use Authorization (EUA). This EUA will remain  in effect (meaning this test can be used) for the duration of the COVID-19 declaration under Section 564(b)(1) of the Act, 21 U.S.C.section 360bbb-3(b)(1), unless the authorization is terminated  or revoked sooner.       Influenza A by PCR  NEGATIVE NEGATIVE Final   Influenza B by PCR NEGATIVE NEGATIVE Final    Comment: (NOTE) The Xpert Xpress SARS-CoV-2/FLU/RSV plus assay is intended as an aid in the diagnosis of influenza from Nasopharyngeal swab specimens and should not be used as a sole basis for treatment. Nasal washings and aspirates are unacceptable for Xpert Xpress SARS-CoV-2/FLU/RSV testing.  Fact Sheet for Patients: EntrepreneurPulse.com.au  Fact Sheet for Healthcare Providers: IncredibleEmployment.be  This test is not yet approved or cleared by the [place] FDA and has been authorized for detection and/or diagnosis of SARS-CoV-2 by FDA under an Emergency Use Authorization (EUA). This EUA will remain in effect (meaning this test can be used) for the duration of the COVID-19 declaration under Section 564(b)(1) of the Act, 21 U.S.C. section 360bbb-3(b)(1), unless the authorization is terminated or revoked.  Performed at [facility], [place] [address], [place], [place] [address]      Radiological Exams on Admission: DG Chest 2 View  Result Date: 02/04/2021 CLINICAL DATA:  Altered mental status EXAM: CHEST - 2 VIEW COMPARISON:  Chest x-ray 04/07/2020 FINDINGS: Heart is enlarged. Mediastinum appears stable. Calcified plaques in the aortic arch. Left-sided cardiac device. Pulmonary vasculature is within normal limits. No focal consolidation, pleural effusion, or pneumothorax identified. IMPRESSION: Cardiomegaly with no acute process identified. Electronically Signed   By: Ofilia Neas M.D.   On: 02/04/2021 12:10   CT HEAD WO CONTRAST (5MM)  Result Date: 02/04/2021 CLINICAL DATA:  Mental status change EXAM: CT HEAD WITHOUT CONTRAST TECHNIQUE: Contiguous axial images were obtained from the base of the skull through the vertex without intravenous contrast. RADIATION DOSE REDUCTION: This exam was performed according to the departmental dose-optimization program which  includes automated exposure control, adjustment of the mA and/or kV according to patient size and/or use of iterative reconstruction technique. COMPARISON:  CT head 04/07/2020 FINDINGS: Brain: Mild generalized atrophy. Moderate white matter hypodensity bilaterally, chronic and unchanged Negative for acute infarct, hemorrhage, or mass. Vascular: Negative for hyperdense vessel Skull: Negative Sinuses/Orbits: Mild mucosal edema right maxillary sinus.  Remaining sinuses clear. Bilateral cataract extraction Other: None IMPRESSION: No acute abnormality. Atrophy and chronic microvascular ischemic changes the white matter. No change from the prior CT. Electronically Signed   By: Franchot Gallo M.D.   On: 02/04/2021 11:24    EKG: Independently reviewed.  Atrial paced rhythm with prolonged AV conduction  Assessment/Plan Urinary tract infection: Acute.  Patient has history of frequent UTIs.  Urinalysis positive for small hemoglobin, large leukocytes, many bacteria, and greater than 50 WBCs.  Patient had been on Macrobid for prophylaxis. -Admit to a medical telemetry bed -Check urine culture -Hold Macrobid -Rocephin IV  Acute metabolic encephalopathy  dementia with behavior disturbance: Patient noted to be less responsive than normal.  CT scan of the brain did not note any acute abnormalities.  Suspect symptoms likely secondary to urinary tract infection as noted had similar presentation last year. -Neuro checks -May warrant further work-up if symptoms do not improve with treatment  Generalized weakness: Acute on chronic.  Patient had been progressively getting weaker over the last several months unable to assist with transfers.  No longer felt appropriate to be in assisted living. -PT/OT to evaluate and treat -Transition of care consulted for need of placement to skilled  Renal insufficiency: Patient presents with creatinine of 1.26 with BUN 26.  Baseline creatinine previously had been around 0.8 -1.    -Recheck kidney function in a.m. -Held diuretics   Essential hypertension: Home blood pressure regimen includes metoprolol 25 mg twice daily, spironolactone 50 mg daily, and torsemide 10 mg daily -Continue metoprolol  Heart failure with preserved EF: Chronic.  Patient does not appear grossly fluid overloaded at this time.  Chest x-ray noted cardiomegaly without signs of edema.  Last echocardiogram noted EF of 60- 65% -Monitor intake and output -Daily weights -Held Aldactone and torsemide in acute setting, but resume when medically improved  Depression and anxiety -Continue current regimen of Seroquel 70 mg nightly, Xanax as needed for anxiety, and trazodone as needed for sleepmedication regimen as tolerated  Paroxysmal atrial fibrillation anticoagulation Status post pacemaker: Patient currently with atrial paced rhythm at 70 bpm. -Continue Eliquis and beta-blocker  DNR: Present on admission  DVT prophylaxis: Eliquis Code Status: DNR Family Communication: Son updated at bedside Disposition Plan: Likely discharge to skilled nursing facility  Consults called: None Admission status: Inpatient require more than 2 midnight stay  Norval Morton MD [facility]   If 7PM-7AM, please contact night-coverage   02/04/2021, 3:37 PM

## 2021-02-04 NOTE — Progress Notes (Signed)
CSW spoke with patient's son Herbie Baltimore who states the patient resides at [facility]. Herbie [place] states he has applied for admission to [facility]. Herbie Baltimore states his mother is unable to care for herself independently. Herbie Baltimore is agreeable to placement at a different facility is [facility] does not have a bed open once the patient is ready for discharge.  Madilyn Fireman, MSW, LCSW Transitions of Care   Clinical Social Worker II [phone]

## 2021-02-04 NOTE — ED Notes (Signed)
Patient is resting comfortably. 

## 2021-02-05 DIAGNOSIS — Z66 Do not resuscitate: Secondary | ICD-10-CM | POA: Diagnosis not present

## 2021-02-05 DIAGNOSIS — F03918 Unspecified dementia, unspecified severity, with other behavioral disturbance: Secondary | ICD-10-CM | POA: Diagnosis not present

## 2021-02-05 DIAGNOSIS — N390 Urinary tract infection, site not specified: Secondary | ICD-10-CM | POA: Diagnosis not present

## 2021-02-05 DIAGNOSIS — I480 Paroxysmal atrial fibrillation: Secondary | ICD-10-CM | POA: Diagnosis not present

## 2021-02-05 LAB — CBC
HCT: 39.1 % (ref 36.0–46.0)
Hemoglobin: 12.8 g/dL (ref 12.0–15.0)
MCH: 31.4 pg (ref 26.0–34.0)
MCHC: 32.7 g/dL (ref 30.0–36.0)
MCV: 96.1 fL (ref 80.0–100.0)
Platelets: 192 10*3/uL (ref 150–400)
RBC: 4.07 MIL/uL (ref 3.87–5.11)
RDW: 13.8 % (ref 11.5–15.5)
WBC: 7.5 10*3/uL (ref 4.0–10.5)
nRBC: 0 % (ref 0.0–0.2)

## 2021-02-05 LAB — BASIC METABOLIC PANEL
Anion gap: 10 (ref 5–15)
BUN: 23 mg/dL (ref 8–23)
CO2: 26 mmol/L (ref 22–32)
Calcium: 8.4 mg/dL — ABNORMAL LOW (ref 8.9–10.3)
Chloride: 102 mmol/L (ref 98–111)
Creatinine, Ser: 1.15 mg/dL — ABNORMAL HIGH (ref 0.44–1.00)
GFR, Estimated: 45 mL/min — ABNORMAL LOW (ref >60)
Glucose, Bld: 100 mg/dL — ABNORMAL HIGH (ref 70–99)
Potassium: 3.9 mmol/L (ref 3.5–5.1)
Sodium: 138 mmol/L (ref 135–145)

## 2021-02-05 MED ORDER — TORSEMIDE 10 MG PO TABS
10.0000 mg | ORAL_TABLET | Freq: Every day | ORAL | Status: DC
  Administered 2021-02-05 – 2021-02-08 (×4): 10 mg via ORAL
  Filled 2021-02-05 (×5): qty 1

## 2021-02-05 MED ORDER — SPIRONOLACTONE 25 MG PO TABS
50.0000 mg | ORAL_TABLET | Freq: Every day | ORAL | Status: DC
  Administered 2021-02-05 – 2021-02-08 (×4): 50 mg via ORAL
  Filled 2021-02-05 (×5): qty 2

## 2021-02-05 NOTE — Evaluation (Signed)
Physical Therapy Evaluation Patient Details Name: Deborah Chung MRN: [id number] DOB: [birth year] Today's Date: 02/05/2021  History of Present Illness  Patient is a 88 y/o female who presents on 02/04/21 due to AMS, speech disorder and weakness. Found to have UTI and acute metabolic encephalopathy with dementia and behavorial disturbance. PMH includes dementia, A-fib, HTN, CHF with pacemaker, CKD stage 3, depression, hearing loss, OA.  Clinical Impression  Patient presents with generalized weakness, baseline cognitive deficits, pain, impaired balance and impaired mobility s/p above. Upon PT/OT arrival, pt's bed/gown is saturated in urine. Requires 2 person assist for bed mobility and standing for clean up and to change sheets/gown. Able to take a few steps along side bed with Mod A of 2 for safety and use of RW. Per chart, pt has been requiring more assist to transfer lately via son. Would benefit from SNF to maximize independence and mobility prior to return home. Will follow acutely.     Recommendations for follow up therapy are one component of a multi-disciplinary discharge planning process, led by the attending physician.  Recommendations may be updated based on patient status, additional functional criteria and insurance authorization.  Follow Up Recommendations Skilled nursing-short term rehab (<3 hours/day)    Assistance Recommended at Discharge Frequent or constant Supervision/Assistance  Patient can return home with the following  A lot of help with walking and/or transfers;A lot of help with bathing/dressing/bathroom;Assistance with cooking/housework;Direct supervision/assist for medications management;Assist for transportation;Help with stairs or ramp for entrance;Direct supervision/assist for financial management    Equipment Recommendations None recommended by PT  Recommendations for Other Services       Functional Status Assessment Patient has had a recent decline in their  functional status and/or demonstrates limited ability to make significant improvements in function in a reasonable and predictable amount of time     Precautions / Restrictions Precautions Precautions: Fall Restrictions Weight Bearing Restrictions: No      Mobility  Bed Mobility Overal bed mobility: Needs Assistance Bed Mobility: Rolling, Sidelying to Sit, Sit to Supine Rolling: Max assist, +2 for physical assistance Sidelying to sit: Max assist, +2 for physical assistance, HOB elevated   Sit to supine: Max assist, +2 for physical assistance   General bed mobility comments: Max A of 2 for bed mobility; assist with LEs, trunk and scooting bottom to EOB. Assist to bring LEs into bed and return to supine as well as reposition.    Transfers Overall transfer level: Needs assistance Equipment used: Rolling walker (2 wheels) Transfers: Sit to/from Stand Sit to Stand: Mod assist, +2 physical assistance, From elevated surface           General transfer comment: Heavy MOd A of 2 to stand from EOB to change saturated bed sheets, posterior bias.    Ambulation/Gait Ambulation/Gait assistance: Mod assist, +2 safety/equipment Gait Distance (Feet): 3 Feet Assistive device: Rolling walker (2 wheels) Gait Pattern/deviations: Leaning posteriorly Gait velocity: decreased     General Gait Details: ABle to take a few steps along side bed with assist for weightr shifting and RW management, fatigues.  Stairs            Wheelchair Mobility    Modified Rankin (Stroke Patients Only)       Balance Overall balance assessment: Needs assistance Sitting-balance support: Feet supported, No upper extremity supported Sitting balance-Leahy Scale: Fair Sitting balance - Comments: Min guard for safety once feet planetd on floor Postural control: Posterior lean Standing balance support: During functional activity, Reliant on assistive device  for balance Standing balance-Leahy Scale:  Poor Standing balance comment: Min-mod A for dynamic standing.                             Pertinent Vitals/Pain Pain Assessment Pain Assessment: Faces Faces Pain Scale: Hurts even more Pain Location: BLES with light touch/movement distally. Pain Descriptors / Indicators: Grimacing, Guarding, Sore, Discomfort Pain Intervention(s): Monitored during session, Repositioned    Home Living Family/patient expects to be discharged to:: Skilled nursing facility                        Prior Function Prior Level of Function : Patient poor historian/Family not available             Mobility Comments: Per chat, via son, pt has needed more help over the last few months with transfers at her ALF, [facility].       Hand Dominance   Dominant Hand: Right    Extremity/Trunk Assessment   Upper Extremity Assessment Upper Extremity Assessment: Defer to OT evaluation    Lower Extremity Assessment Lower Extremity Assessment: Generalized weakness (Sensitive to touch, minimal movement noted, able to stand without buckling but weakness present BLEs throughout. Swelling distally.)       Communication   Communication: Expressive difficulties  Cognition Arousal/Alertness: Awake/alert Behavior During Therapy: WFL for tasks assessed/performed Overall Cognitive Status: No family/caregiver present to determine baseline cognitive functioning                                 General Comments: INitially resistant to having covers removed but allowed for gown change and mobility as pt was cold and wet from being saturated in urine. Follows some commands with increased time/repetition. Hx of dementia.        General Comments      Exercises     Assessment/Plan    PT Assessment Patient needs continued PT services  PT Problem List Decreased mobility;Decreased strength;Decreased safety awareness;Obesity;Decreased activity tolerance;Decreased cognition;Decreased  skin integrity;Pain;Decreased balance       PT Treatment Interventions Therapeutic activities;Gait training;Therapeutic exercise;Patient/family education;Balance training;Functional mobility training;Modalities    PT Goals (Current goals can be found in the Care Plan section)  Acute Rehab PT Goals Patient Stated Goal: to get warmer PT Goal Formulation: With patient Time For Goal Achievement: 02/19/21 Potential to Achieve Goals: Good    Frequency Min 2X/week     Co-evaluation               AM-PAC PT "6 Clicks" Mobility  Outcome Measure Help needed turning from your back to your side while in a flat bed without using bedrails?: Total Help needed moving from lying on your back to sitting on the side of a flat bed without using bedrails?: Total Help needed moving to and from a bed to a chair (including a wheelchair)?: Total Help needed standing up from a chair using your arms (e.g., wheelchair or bedside chair)?: A Lot Help needed to walk in hospital room?: Total Help needed climbing 3-5 steps with a railing? : Total 6 Click Score: 7    End of Session Equipment Utilized During Treatment: Gait belt Activity Tolerance: Patient limited by pain;Patient limited by fatigue Patient left: in bed;with call bell/phone within reach;with bed alarm set Nurse Communication: Mobility status PT Visit Diagnosis: Pain Pain - Right/Left:  (bil) Pain - part of body:  Leg    Time: [id number] PT Time Calculation (min) (ACUTE ONLY): 22 min   Charges:   PT Evaluation $PT Eval Moderate Complexity: 1 Mod          Marisa Severin, PT, DPT Acute Rehabilitation Services Pager [phone] Office [phone]     Marguarite Arbour A Sabra Heck 02/05/2021, 10:27 AM

## 2021-02-05 NOTE — TOC Initial Note (Signed)
Transition of Care [facility]) - Initial/Assessment Note    Patient Details  Name: Deborah Chung MRN: [id number] Date of Birth: 1926/05/30  Transition of Care [facility]) CM/SW Contact:    Emeterio Reeve, LCSW Phone Number: 02/05/2021, 3:50 PM  Clinical Narrative:                  CSW received SNF consult. CSW spoke to pt son Herbie [place] on phone. CSW introduced self and explained role at the hospital. Pt reports that PTA pt lived at [facility]. Pt was requiring more assistance at her ALF.   CSW reviewed PT/OT recommendations for SNF. Pts son reports that he would like for pt to go to SNF/ LTC at [facility]. He reports pt is already on the wait list.Pt gave CSW permission to fax out to facilities in the area.  CSW gave pt medicare.gov rating list to review. CSW explained insurance auth process. Pt reports they are covid vaccinated.  CSW will continue to follow.   Expected Discharge Plan: Skilled Nursing Facility Barriers to Discharge: Continued Medical Work up, Ship broker   Patient Goals and CMS Choice Patient states their goals for this hospitalization and ongoing recovery are:: to get better CMS Medicare.gov Compare Post Acute Care list provided to:: Patient Choice offered to / list presented to : Adult Children  Expected Discharge Plan and Services Expected Discharge Plan: [place]       Living arrangements for the past 2 months: [place]                                      Prior Living Arrangements/Services Living arrangements for the past 2 months: [place] Lives with:: Self Patient language and need for interpreter reviewed:: Yes Do you feel safe going back to the place where you live?: Yes      Need for Family Participation in Patient Care: Yes (Comment) Care giver support system in place?: Yes (comment)   Criminal Activity/Legal Involvement Pertinent to Current Situation/Hospitalization: No - Comment as  needed  Activities of Daily Living      Permission Sought/Granted Permission sought to share information with : Family Supports Permission granted to share information with : Yes, Verbal Permission Granted     Permission granted to share info w AGENCY: SNF        Emotional Assessment Appearance:: Appears stated age Attitude/Demeanor/Rapport: Engaged Affect (typically observed): Appropriate Orientation: : Oriented to Self, Oriented to Place, Oriented to  Time, Oriented to Situation Alcohol / Substance Use: Not Applicable Psych Involvement: No (comment)  Admission diagnosis:  Confusion [R41.0] UTI (urinary tract infection) [N39.0] Weakness [R53.1] Urinary tract infection without hematuria, site unspecified [N39.0] Patient Active Problem List   Diagnosis Date Noted   Acute metabolic encephalopathy [id number]   DNR (do not resuscitate) 02/04/2021   Delirium    Dementia with behavioral disturbance    Palliative care by specialist    UTI (urinary tract infection) 04/08/2020   Tinea corporis 04/21/2019   Hallucinations due to late onset dementia 04/21/2019   Stage 3b chronic kidney disease ([place]) 04/21/2019   Complete heart block ([place]) 01/28/2019   Rotator cuff tear arthropathy 01/28/2018   Primary osteoarthritis involving multiple joints 01/27/2018   Psychophysiological insomnia 06/24/2017   Bacterial overgrowth syndrome 08/31/2015   Non-ischemic cardiomyopathy ([place]) 08/21/2015   Hyperlipidemia with target LDL less than 100 05/30/2015   Allergic rhinitis due to  pollen 05/30/2015   Gastroesophageal reflux disease without esophagitis 05/30/2015   Hyperglycemia 05/30/2015   Goals of care, counseling/discussion 05/30/2015   Acute cystitis without hematuria 05/16/2014   Pacemaker 08/03/2013   Atrial fibrillation ([place]) 06/29/2013   Essential hypertension, benign 06/29/2013   Hypertriglyceridemia 06/29/2013   Depression with anxiety 06/29/2013   PHN (postherpetic neuralgia)  06/29/2013   PCP:  Janith Lima, MD Pharmacy:   [facility] [place], [place] [address] Phone: [phone] Fax: [phone]     Social Determinants of Health (SDOH) Interventions    Readmission Risk Interventions No flowsheet data found.  Emeterio Reeve, LCSW Clinical Social Worker

## 2021-02-05 NOTE — Evaluation (Signed)
Occupational Therapy Evaluation Patient Details Name: Deborah Chung MRN: [id number] DOB: 1926/04/18 Today's Date: 02/05/2021   History of Present Illness Patient is a 88 y/o female who presents on 02/04/21 due to AMS, speech disorder and weakness. Found to have UTI and acute metabolic encephalopathy with dementia and behavorial disturbance. PMH includes dementia, A-fib, HTN, CHF with pacemaker, CKD stage 3, depression, hearing loss, OA.   Clinical Impression   Pt from SNF, unclear baseline. Currently, pt min-max A +2 for ADLs, max A +2 for bed mobility,and mod A +2 for transfers. Pt able to stand EOB for linen change and take side steps with increased time and directional cuing with RW. Per chart review/son's report, pt has needed more assistance with transfers. Pt presenting with impairments listed below, will continue to follow acutely to maximize safety and independence with ADLs/functional mobility. Recommend d/c to SNF.     Recommendations for follow up therapy are one component of a multi-disciplinary discharge planning process, led by the attending physician.  Recommendations may be updated based on patient status, additional functional criteria and insurance authorization.   Follow Up Recommendations  Skilled nursing-short term rehab (<3 hours/day)    Assistance Recommended at Discharge Intermittent Supervision/Assistance  Patient can return home with the following A lot of help with walking and/or transfers;A lot of help with bathing/dressing/bathroom;Assistance with cooking/housework;Help with stairs or ramp for entrance;Assist for transportation    Functional Status Assessment  Patient has had a recent decline in their functional status and demonstrates the ability to make significant improvements in function in a reasonable and predictable amount of time.  Equipment Recommendations  None recommended by OT;Other (comment) (defer to next venue of care)    Recommendations for Other  Services PT consult     Precautions / Restrictions Precautions Precautions: Fall Restrictions Weight Bearing Restrictions: No      Mobility Bed Mobility Overal bed mobility: Needs Assistance Bed Mobility: Rolling, Sidelying to Sit, Sit to Supine Rolling: Max assist, +2 for physical assistance Sidelying to sit: Max assist, +2 for physical assistance, HOB elevated   Sit to supine: Max assist, +2 for physical assistance        Transfers Overall transfer level: Needs assistance Equipment used: Rolling walker (2 wheels) Transfers: Sit to/from Stand Sit to Stand: Mod assist, +2 physical assistance, From elevated surface                  Balance Overall balance assessment: Needs assistance Sitting-balance support: Feet supported, No upper extremity supported Sitting balance-Leahy Scale: Fair   Postural control: Posterior lean Standing balance support: During functional activity, Reliant on assistive device for balance Standing balance-Leahy Scale: Poor                             ADL either performed or assessed with clinical judgement   ADL Overall ADL's : Needs assistance/impaired Eating/Feeding: Set up;Sitting   Grooming: Minimal assistance;Sitting   Upper Body Bathing: Moderate assistance;Sitting;Maximal assistance   Lower Body Bathing: Total assistance;Bed level   Upper Body Dressing : Minimal assistance;Sitting Upper Body Dressing Details (indicate cue type and reason): min A to guide BUE's into arm holes Lower Body Dressing: Total assistance;Bed level   Toilet Transfer: Moderate assistance;+2 for physical assistance;Stand-pivot;BSC/3in1   Toileting- Clothing Manipulation and Hygiene: Maximal assistance;Sit to/from stand       Functional mobility during ADLs: Moderate assistance;Maximal assistance       Vision   Vision Assessment?: No  apparent visual deficits     Perception     Praxis      Pertinent Vitals/Pain Pain  Assessment Pain Assessment: Faces Pain Score: 6  Faces Pain Scale: Hurts even more Pain Location: BLES with light touch/movement distally. Pain Descriptors / Indicators: Grimacing, Guarding, Sore, Discomfort Pain Intervention(s): Limited activity within patient's tolerance, Monitored during session     Hand Dominance Right   Extremity/Trunk Assessment Upper Extremity Assessment Upper Extremity Assessment: Generalized weakness   Lower Extremity Assessment Lower Extremity Assessment: Defer to PT evaluation       Communication Communication Communication: Expressive difficulties   Cognition Arousal/Alertness: Awake/alert Behavior During Therapy: WFL for tasks assessed/performed Overall Cognitive Status: No family/caregiver present to determine baseline cognitive functioning                                 General Comments: perseverative with conversation at times, follows single step commands with increased time     General Comments  VSS throughout session    Exercises     Shoulder Instructions      Home Living Family/patient expects to be discharged to:: Skilled nursing facility                                        Prior Functioning/Environment Prior Level of Function : Patient poor historian/Family not available             Mobility Comments: Per chart review, via son, pt has needed more help over the last few months with transfers at her ALF, [facility].          OT Problem List: Decreased strength;Decreased range of motion;Decreased activity tolerance;Impaired sensation;Decreased cognition;Decreased safety awareness;Impaired balance (sitting and/or standing);Decreased knowledge of use of DME or AE;Pain      OT Treatment/Interventions: Self-care/ADL training;Therapeutic exercise;DME and/or AE instruction;Therapeutic activities;Cognitive remediation/compensation;Patient/family education;Balance training    OT Goals(Current  goals can be found in the care plan section) Acute Rehab OT Goals Patient Stated Goal: none stated OT Goal Formulation: With patient Time For Goal Achievement: 02/19/21 Potential to Achieve Goals: Fair ADL Goals Pt Will Perform Upper Body Dressing: with min guard assist;sitting Pt Will Perform Lower Body Dressing: with max assist;sitting/lateral leans Pt Will Transfer to Toilet: with min assist;with +2 assist;bedside commode;stand pivot transfer  OT Frequency: Min 2X/week    Co-evaluation              AM-PAC OT "6 Clicks" Daily Activity     Outcome Measure Help from another person eating meals?: A Little Help from another person taking care of personal grooming?: A Little Help from another person toileting, which includes using toliet, bedpan, or urinal?: A Lot Help from another person bathing (including washing, rinsing, drying)?: A Lot Help from another person to put on and taking off regular upper body clothing?: A Little Help from another person to put on and taking off regular lower body clothing?: Total 6 Click Score: 14   End of Session Equipment Utilized During Treatment: Gait belt;Rolling walker (2 wheels) Nurse Communication: Mobility status  Activity Tolerance: Patient tolerated treatment well;Patient limited by fatigue Patient left: in bed;with call bell/phone within reach;with bed alarm set  OT Visit Diagnosis: Unsteadiness on feet (R26.81);Other abnormalities of gait and mobility (R26.89);Pain Pain - part of body: Arm;Leg;Ankle and joints of foot  Time: [id number] OT Time Calculation (min): 22 min Charges:  OT General Charges $OT Visit: 1 Visit OT Evaluation $OT Eval Moderate Complexity: 1 [address] OTD, OTR/L Acute Rehab [phone] - [place] 02/05/2021, 12:47 PM

## 2021-02-05 NOTE — [place] FL2 (Signed)
[place] MEDICAID FL2 LEVEL OF CARE SCREENING TOOL     IDENTIFICATION  Patient Name: Deborah Chung Birthdate: 06-12-1926 Sex: female Admission Date (Current Location): 02/04/2021  [facility] and [place] Number:  Herbalist and Address:  [facility] [address], [place], [place] [address]      Provider Number: [id number]  Attending Physician Name and Address:  Darliss Cheney, MD  Relative Name and Phone Number:       Current Level of Care: Hospital Recommended Level of Care: [place] Prior Approval Number:    Date Approved/Denied:   PASRR Number: [id number]  Discharge Plan: SNF    Current Diagnoses: Patient Active Problem List   Diagnosis Date Noted   Acute metabolic encephalopathy [id number]   DNR (do not resuscitate) 02/04/2021   Delirium    Dementia with behavioral disturbance    Palliative care by specialist    UTI (urinary tract infection) 04/08/2020   Tinea corporis 04/21/2019   Hallucinations due to late onset dementia 04/21/2019   Stage 3b chronic kidney disease ([place]) 04/21/2019   Complete heart block ([place]) 01/28/2019   Rotator cuff tear arthropathy 01/28/2018   Primary osteoarthritis involving multiple joints 01/27/2018   Psychophysiological insomnia 06/24/2017   Bacterial overgrowth syndrome 08/31/2015   Non-ischemic cardiomyopathy ([place]) 08/21/2015   Hyperlipidemia with target LDL less than 100 05/30/2015   Allergic rhinitis due to pollen 05/30/2015   Gastroesophageal reflux disease without esophagitis 05/30/2015   Hyperglycemia 05/30/2015   Goals of care, counseling/discussion 05/30/2015   Acute cystitis without hematuria 05/16/2014   Pacemaker 08/03/2013   Atrial fibrillation ([place]) 06/29/2013   Essential hypertension, benign 06/29/2013   Hypertriglyceridemia 06/29/2013   Depression with anxiety 06/29/2013   PHN (postherpetic neuralgia) 06/29/2013    Orientation RESPIRATION BLADDER Height &  Weight     Self, Time, Situation, Place  Normal Incontinent Weight:   Height:     BEHAVIORAL SYMPTOMS/MOOD NEUROLOGICAL BOWEL NUTRITION STATUS      Incontinent Diet (See DC summary)  AMBULATORY STATUS COMMUNICATION OF NEEDS Skin   Extensive Assist Verbally Normal                       Personal Care Assistance Level of Assistance  Feeding, Bathing, Dressing Bathing Assistance: Maximum assistance Feeding assistance: Limited assistance Dressing Assistance: Maximum assistance     Functional Limitations Info  Sight, Hearing, Speech Sight Info: Adequate Hearing Info: Adequate Speech Info: Adequate    SPECIAL CARE FACTORS FREQUENCY  PT (By licensed PT), OT (By licensed OT)     PT Frequency: 5x a week OT Frequency: 5x  week            Contractures Contractures Info: Not present    Additional Factors Info  Code Status, Allergies Code Status Info: DNR Allergies Info: NKA           Current Medications (02/05/2021):  This is the current hospital active medication list Current Facility-Administered Medications  Medication Dose Route Frequency Provider Last Rate Last Admin   acetaminophen (TYLENOL) tablet 650 mg  650 mg Oral Q6H PRN Norval Morton, MD       Or   acetaminophen (TYLENOL) suppository 650 mg  650 mg Rectal Q6H PRN Fuller Plan A, MD       albuterol (PROVENTIL) (2.5 MG/3ML) 0.083% nebulizer solution 2.5 mg  2.5 mg Nebulization Q6H PRN Fuller Plan A, MD       apixaban (ELIQUIS) tablet 2.5 mg  2.5 mg Oral BID Fuller Plan A, MD   2.5 mg at 02/05/21 1027   aspirin chewable tablet 81 mg  81 mg Oral Daily Fuller Plan A, MD   81 mg at 02/05/21 1027   calcium carbonate (TUMS - dosed in mg elemental calcium) chewable tablet 200 mg of elemental calcium  1 tablet Oral BID PRN Fuller Plan A, MD       cefTRIAXone (ROCEPHIN) 2 g in sodium chloride 0.9 % 100 mL IVPB  2 g Intravenous Q24H Fuller Plan A, MD   Stopped at 02/04/21 1726    diphenoxylate-atropine (LOMOTIL) 2.5-0.025 MG per tablet 1 tablet  1 tablet Oral Q4H PRN Fuller Plan A, MD       DULoxetine (CYMBALTA) DR capsule 60 mg  60 mg Oral Daily Smith, Rondell A, MD   60 mg at 02/05/21 1027   guaiFENesin (ROBITUSSIN) 100 MG/5ML liquid 200 mg  200 mg Oral Q4H PRN Fuller Plan A, MD       lipase/protease/amylase (CREON) capsule 36,000 Units  36,000 Units Oral TID WC Fuller Plan A, MD   36,000 Units at 02/05/21 1349   lipase/protease/amylase (CREON) capsule 36,000 Units  36,000 Units Oral Daily PRN Fuller Plan A, MD       loratadine (CLARITIN) tablet 10 mg  10 mg Oral Daily Smith, Rondell A, MD   10 mg at 02/05/21 1027   metoprolol tartrate (LOPRESSOR) tablet 25 mg  25 mg Oral BID Fuller Plan A, MD   25 mg at 02/05/21 1027   nystatin (MYCOSTATIN/NYSTOP) topical powder 1 application  1 application Topical BID Fuller Plan A, MD   1 application at 0000000 1027   pantoprazole (PROTONIX) EC tablet 40 mg  40 mg Oral Daily Smith, Rondell A, MD   40 mg at 02/05/21 1027   polyvinyl alcohol (LIQUIFILM TEARS) 1.4 % ophthalmic solution 1 drop  1 drop Both Eyes QID PRN Fuller Plan A, MD       QUEtiapine (SEROQUEL) tablet 75 mg  75 mg Oral QHS Smith, Rondell A, MD   75 mg at 02/04/21 2245   saccharomyces boulardii (FLORASTOR) capsule 250 mg  250 mg Oral Daily Smith, Rondell A, MD   250 mg at 02/05/21 1027   sodium chloride flush (NS) 0.9 % injection 3 mL  3 mL Intravenous Q12H Smith, Rondell A, MD   3 mL at 02/05/21 1031   spironolactone (ALDACTONE) tablet 50 mg  50 mg Oral Daily Pahwani, Einar Grad, MD   50 mg at 02/05/21 1349   torsemide (DEMADEX) tablet 10 mg  10 mg Oral Daily Darliss Cheney, MD   10 mg at 02/05/21 1349   traZODone (DESYREL) tablet 25 mg  25 mg Oral QHS PRN Norval Morton, MD         Discharge Medications: Please see discharge summary for a list of discharge medications.  Relevant Imaging Results:  Relevant Lab Results:   Additional  Information SS#: [id number]; Patient has received both Moderna vaccines and booster vaccine  Emeterio Reeve, LCSW

## 2021-02-05 NOTE — Plan of Care (Signed)
°  Problem: Education: Goal: Knowledge of General Education information will improve Description: Including pain rating scale, medication(s)/side effects and non-pharmacologic comfort measures Outcome: Not Progressing   Problem: Health Behavior/Discharge Planning: Goal: Ability to manage health-related needs will improve Outcome: Not Progressing   Problem: Clinical Measurements: Goal: Will remain free from infection Outcome: Progressing Goal: Cardiovascular complication will be avoided Outcome: Progressing   Problem: Pain Managment: Goal: General experience of comfort will improve Outcome: Progressing

## 2021-02-05 NOTE — Progress Notes (Signed)
PROGRESS NOTE    Deborah Chung  [id number] DOB: 1926-03-01 DOA: 02/04/2021 PCP: Janith Lima, MD   Brief Narrative:  HPI: Deborah Chung is a 88 y.o. female with medical history significant of hypertension, hyperlipidemia, systolic CHF, frequent UTIs, and dementia who presents after being found to have increased weakness and due to the patient being less responsive.  History is obtained from the patient's son who is present at bedside as the patient has dementia.  At baseline the understandability of her speech reportedly fluctuates and she had been previously able to help assist with transfers.  Over the last couple of months patient has been less able to help with transfers needing more assistance.  The patient is able to recognize her son and other family members.  She had been placed on Seroquel due to intermittent combativeness  with staff members.  Son agrees patient's speech was more difficult to understand when she first came into the hospital than normal.  He reports that she had similar symptoms like this back in 03/2020 that were attributed to a urinary tract infection that improved with treatment.  After that hospitalization she had to go to rehab due to significant weakness thereafter.  He had already been looking into skilled nursing facilities as it was thought that she was getting to the point where she could no longer live in assisted living.  He had like a place called [facility], but notes that they had no beds available as of last week.  She currently denies any complaints.  Son notes that she chronically has lower extremity swelling.     ED Course: Upon admission into the emergency department patient was in to be afebrile with stable vital signs.  Labs note hemoglobin 15.1, BUN 26, creatinine 1.26,  calcium 8.7, and high-sensitivity troponins negative x2.  CT scan of the brain showed no acute abnormalities.  Urinalysis was positive for large leukocytes many bacteria, and greater  than 50 WBCs.  Influenza and COVID-19 screening were negative.  Chest x-ray noted cardiomegaly without acute process.  Patient had been given 500 mL normal saline IV fluid bolus.  Assessment & Plan:   Principal Problem:   UTI (urinary tract infection) Active Problems:   Atrial fibrillation (HCC)   Essential hypertension, benign   Depression with anxiety   Dementia with behavioral disturbance   Acute metabolic encephalopathy   DNR (do not resuscitate)  Urinary tract infection: Continue Rocephin   Acute toxic encephalopathy  dementia with behavior disturbance: Patient noted to be less responsive than normal.  CT scan of the brain did not note any acute abnormalities. Patient is fully alert and she is also oriented that she is in the hospital but she could not tell me that name of the hospital, month or current ER.  I suspect this is her baseline.  I was able to talk today son who is coming to see her this afternoon and will verify about that.   Generalized weakness: Acute on chronic.  PT OT recommends SNF, TOC consulted.   CKD stage IIIb: Patient's baseline creatinine is around 1.2 and she was at her baseline when she came in yesterday.  Please note that patient does not have acute kidney injury or acute renal insufficiency as mentioned in HPI.  This is her baseline creatinine.  Congestive heart failure with preserved EF: Chronic.   Chest x-ray noted cardiomegaly without signs of edema.  Last echocardiogram noted EF of 60- 65%.  She appears euvolemic.  Resume her home  dose of Aldactone and torsemide.   Depression and anxiety: Continue current regimen of Seroquel 70 mg nightly and trazodone as needed for sleepmedication regimen as tolerated however I will discontinue her Xanax as benzodiazepines are known to cause worsening of delirium.   Paroxysmal atrial fibrillation anticoagulation Status post pacemaker: Patient currently with atrial paced rhythm at 70 bpm. -Continue Eliquis and  beta-blocker  DVT prophylaxis: apixaban (ELIQUIS) tablet 2.5 mg Start: 02/04/21 2200   Code Status: DNR  Family Communication:  None present at bedside.  Plan of care discussed with patient's son over the phone.  Status is: Inpatient  Remains inpatient appropriate because: Needs SNF bed placement       Estimated body mass index is 40.42 kg/m as calculated from the following:   Height as of 05/08/20: 5\' 3"  (1.6 m).   Weight as of 05/08/20: 103.5 kg.    Nutritional Assessment: There is no height or weight on file to calculate BMI.. Seen by dietician.  I agree with the assessment and plan as outlined below: Nutrition Status:        . Skin Assessment: I have examined the patient's skin and I agree with the wound assessment as performed by the wound care RN as outlined below:    Consultants:  None  Procedures:  Plan  Antimicrobials:  Anti-infectives (From admission, onward)    Start     Dose/Rate Route Frequency Ordered Stop   02/04/21 1600  cefTRIAXone (ROCEPHIN) 2 g in sodium chloride 0.9 % 100 mL IVPB        2 g 200 mL/hr over 30 Minutes Intravenous Every 24 hours 02/04/21 1555           Subjective: Patient seen and examined.  She was fully alert and oriented to person and partly to place.  She had no complaints.  Objective: Vitals:   02/04/21 2200 02/04/21 2231 02/05/21 0506 02/05/21 0730  BP: (!) 143/80 134/63 133/90 (!) 118/58  Pulse: 70 70 74 69  Resp: 16 18 17 16   Temp:  98.2 F (36.8 C) 98.2 F (36.8 C) 97.7 F (36.5 C)  TempSrc:  Oral Oral Oral  SpO2: 93% 95% 98% 99%    Intake/Output Summary (Last 24 hours) at 02/05/2021 1047 Last data filed at 02/04/2021 2245 Gross per 24 hour  Intake 918.97 ml  Output --  Net 918.97 ml   There were no vitals filed for this visit.  Examination:  General exam: Appears calm and comfortable  Respiratory system: Clear to auscultation. Respiratory effort normal. Cardiovascular system: S1 & S2 heard, RRR.  No JVD, murmurs, rubs, gallops or clicks. No pedal edema. Gastrointestinal system: Abdomen is nondistended, soft and nontender. No organomegaly or masses felt. Normal bowel sounds heard. Central nervous system: Alert and oriented x2. No focal neurological deficits. Extremities: Symmetric 5 x 5 power. Skin: No rashes, lesions or ulcers Psychiatry: Judgement and insight appear poor   Data Reviewed: I have personally reviewed following labs and imaging studies  CBC: Recent Labs  Lab 02/04/21 1023 02/05/21 0153  WBC 7.4 7.5  NEUTROABS 4.3  --   HGB 15.1* 12.8  HCT 47.8* 39.1  MCV 99.2 96.1  PLT 192 [id number]   Basic Metabolic Panel: Recent Labs  Lab 02/04/21 1023 02/05/21 0153  NA 137 138  K 4.3 3.9  CL 104 102  CO2 26 26  GLUCOSE 108* 100*  BUN 26* 23  CREATININE 1.26* 1.15*  CALCIUM 8.7* 8.4*   GFR: CrCl cannot be calculated (Unknown  ideal weight.). Liver Function Tests: Recent Labs  Lab 02/04/21 1023  AST 15  ALT 11  ALKPHOS 118  BILITOT 0.8  PROT 7.3  ALBUMIN 3.8   No results for input(s): LIPASE, AMYLASE in the last 168 hours. No results for input(s): AMMONIA in the last 168 hours. Coagulation Profile: No results for input(s): INR, PROTIME in the last 168 hours. Cardiac Enzymes: No results for input(s): CKTOTAL, CKMB, CKMBINDEX, TROPONINI in the last 168 hours. BNP (last 3 results) No results for input(s): PROBNP in the last 8760 hours. HbA1C: No results for input(s): HGBA1C in the last 72 hours. CBG: Recent Labs  Lab 02/04/21 1013  GLUCAP 99   Lipid Profile: No results for input(s): CHOL, HDL, LDLCALC, TRIG, CHOLHDL, LDLDIRECT in the last 72 hours. Thyroid Function Tests: No results for input(s): TSH, T4TOTAL, FREET4, T3FREE, THYROIDAB in the last 72 hours. Anemia Panel: No results for input(s): VITAMINB12, FOLATE, FERRITIN, TIBC, IRON, RETICCTPCT in the last 72 hours. Sepsis Labs: No results for input(s): PROCALCITON, LATICACIDVEN in the last 168  hours.  Recent Results (from the past 240 hour(s))  Resp Panel by RT-PCR (Flu A&B, Covid) Urine, In & Out Cath     Status: None   Collection Time: 02/04/21 12:46 PM   Specimen: Urine, In & Out Cath; Nasopharyngeal(NP) swabs in vial transport medium  Result Value Ref Range Status   SARS Coronavirus 2 by RT PCR NEGATIVE NEGATIVE Final    Comment: (NOTE) SARS-CoV-2 target nucleic acids are NOT DETECTED.  The SARS-CoV-2 RNA is generally detectable in upper respiratory specimens during the acute phase of infection. The lowest concentration of SARS-CoV-2 viral copies this assay can detect is 138 copies/mL. A negative result does not preclude SARS-Cov-2 infection and should not be used as the sole basis for treatment or other patient management decisions. A negative result may occur with  improper specimen collection/handling, submission of specimen other than nasopharyngeal swab, presence of viral mutation(s) within the areas targeted by this assay, and inadequate number of viral copies(<138 copies/mL). A negative result must be combined with clinical observations, patient history, and epidemiological information. The expected result is Negative.  Fact Sheet for Patients:  EntrepreneurPulse.com.au  Fact Sheet for Healthcare Providers:  IncredibleEmployment.be  This test is no t yet approved or cleared by the [place] FDA and  has been authorized for detection and/or diagnosis of SARS-CoV-2 by FDA under an Emergency Use Authorization (EUA). This EUA will remain  in effect (meaning this test can be used) for the duration of the COVID-19 declaration under Section 564(b)(1) of the Act, 21 U.S.C.section 360bbb-3(b)(1), unless the authorization is terminated  or revoked sooner.       Influenza A by PCR NEGATIVE NEGATIVE Final   Influenza B by PCR NEGATIVE NEGATIVE Final    Comment: (NOTE) The Xpert Xpress SARS-CoV-2/FLU/RSV plus assay is intended  as an aid in the diagnosis of influenza from Nasopharyngeal swab specimens and should not be used as a sole basis for treatment. Nasal washings and aspirates are unacceptable for Xpert Xpress SARS-CoV-2/FLU/RSV testing.  Fact Sheet for Patients: EntrepreneurPulse.com.au  Fact Sheet for Healthcare Providers: IncredibleEmployment.be  This test is not yet approved or cleared by the [place] FDA and has been authorized for detection and/or diagnosis of SARS-CoV-2 by FDA under an Emergency Use Authorization (EUA). This EUA will remain in effect (meaning this test can be used) for the duration of the COVID-19 declaration under Section 564(b)(1) of the Act, 21 U.S.C. section 360bbb-3(b)(1), unless  the authorization is terminated or revoked.  Performed at [facility], [place] [address], [place], [place] [address]      Radiology Studies: DG Chest 2 View  Result Date: 02/04/2021 CLINICAL DATA:  Altered mental status EXAM: CHEST - 2 VIEW COMPARISON:  Chest x-ray 04/07/2020 FINDINGS: Heart is enlarged. Mediastinum appears stable. Calcified plaques in the aortic arch. Left-sided cardiac device. Pulmonary vasculature is within normal limits. No focal consolidation, pleural effusion, or pneumothorax identified. IMPRESSION: Cardiomegaly with no acute process identified. Electronically Signed   By: Ofilia Neas M.D.   On: 02/04/2021 12:10   CT HEAD WO CONTRAST (5MM)  Result Date: 02/04/2021 CLINICAL DATA:  Mental status change EXAM: CT HEAD WITHOUT CONTRAST TECHNIQUE: Contiguous axial images were obtained from the base of the skull through the vertex without intravenous contrast. RADIATION DOSE REDUCTION: This exam was performed according to the departmental dose-optimization program which includes automated exposure control, adjustment of the mA and/or kV according to patient size and/or use of iterative reconstruction technique. COMPARISON:  CT head  04/07/2020 FINDINGS: Brain: Mild generalized atrophy. Moderate white matter hypodensity bilaterally, chronic and unchanged Negative for acute infarct, hemorrhage, or mass. Vascular: Negative for hyperdense vessel Skull: Negative Sinuses/Orbits: Mild mucosal edema right maxillary sinus. Remaining sinuses clear. Bilateral cataract extraction Other: None IMPRESSION: No acute abnormality. Atrophy and chronic microvascular ischemic changes the white matter. No change from the prior CT. Electronically Signed   By: Franchot Gallo M.D.   On: 02/04/2021 11:24    Scheduled Meds:  apixaban  2.5 mg Oral BID   aspirin  81 mg Oral Daily   DULoxetine  60 mg Oral Daily   lipase/protease/amylase  36,000 Units Oral TID WC   loratadine  10 mg Oral Daily   metoprolol tartrate  25 mg Oral BID   nystatin  1 application Topical BID   pantoprazole  40 mg Oral Daily   QUEtiapine  75 mg Oral QHS   saccharomyces boulardii  250 mg Oral Daily   sodium chloride flush  3 mL Intravenous Q12H   Continuous Infusions:  cefTRIAXone (ROCEPHIN)  IV Stopped (02/04/21 1726)     LOS: 1 day   Time spent: 34 minutes  Darliss Cheney, MD [facility]  02/05/2021, 10:47 AM  Please page via [place] and do not message via secure chat for anything urgent. Secure chat can be used for anything non urgent.  How to contact the [facility] Attending or Consulting provider [place] or covering provider during after hours [place], for this patient?  Check the care team in [facility] and look for a) attending/consulting TR[facility] provider listed and b) the [facility] team listed. Page or secure chat [phone]. Log into [email or web address] and use [place]'s universal password to access. If you do not have the password, please contact the hospital operator. Locate the [facility] provider you are looking for under [facility] and page to a number that you can be directly reached. If you still have difficulty reaching the provider, please page the [facility] (Director on Call) for the  Hospitalists listed on amion for assistance.

## 2021-02-06 DIAGNOSIS — N390 Urinary tract infection, site not specified: Secondary | ICD-10-CM | POA: Diagnosis not present

## 2021-02-06 LAB — BASIC METABOLIC PANEL
Anion gap: 10 (ref 5–15)
BUN: 17 mg/dL (ref 8–23)
CO2: 31 mmol/L (ref 22–32)
Calcium: 9.1 mg/dL (ref 8.9–10.3)
Chloride: 101 mmol/L (ref 98–111)
Creatinine, Ser: 1.24 mg/dL — ABNORMAL HIGH (ref 0.44–1.00)
GFR, Estimated: 41 mL/min — ABNORMAL LOW (ref >60)
Glucose, Bld: 103 mg/dL — ABNORMAL HIGH (ref 70–99)
Potassium: 4.7 mmol/L (ref 3.5–5.1)
Sodium: 142 mmol/L (ref 135–145)

## 2021-02-06 LAB — URINE CULTURE: Culture: >=100000 — AB

## 2021-02-06 MED ORDER — CEPHALEXIN 500 MG PO CAPS
500.0000 mg | ORAL_CAPSULE | Freq: Three times a day (TID) | ORAL | Status: DC
  Administered 2021-02-06 – 2021-02-09 (×9): 500 mg via ORAL
  Filled 2021-02-06 (×10): qty 1

## 2021-02-06 MED ORDER — ONDANSETRON HCL 4 MG/2ML IJ SOLN: 4.0000 mg | Freq: Four times a day (QID) | INTRAMUSCULAR | Status: DC | PRN

## 2021-02-06 NOTE — Progress Notes (Signed)
PROGRESS NOTE    Deborah Chung  [id number] DOB: 1926/05/09 DOA: 02/04/2021 PCP: Janith Lima, MD   Brief Narrative:  Deborah Chung is a 88 y.o. female with medical history significant of hypertension, hyperlipidemia, systolic CHF, frequent UTIs, and dementia who presented after being found to have increased weakness and due to the patient being less responsive.  At baseline the understandability of her speech reportedly fluctuates and she had been previously able to help assist with transfers.  Over the last couple of months patient has been less able to help with transfers needing more assistance.  She had been placed on Seroquel due to intermittent combativeness  with staff members.  Son agrees patient's speech was more difficult to understand when she first came into the hospital than normal.  He reports that she had similar symptoms like this back in 03/2020 that were attributed to a urinary tract infection that improved with treatment.  He had already been looking into skilled nursing facilities as it was thought that she was getting to the point where she could no longer live in assisted living.    Upon admission to ED, patient was afebrile with stable vital signs.  Labs fairly within normal range.  CT scan of the brain showed no acute abnormalities.  Urinalysis was positive for large leukocytes many bacteria, and greater than 50 WBCs.  Influenza and COVID-19 screening were negative.  Chest x-ray noted cardiomegaly without acute process.  Patient had been given 500 mL normal saline IV fluid bolus.  Admitted under hospitalist service.  Assessment & Plan:   Principal Problem:   UTI (urinary tract infection) Active Problems:   Atrial fibrillation (HCC)   Essential hypertension, benign   Depression with anxiety   Dementia with behavioral disturbance   Acute metabolic encephalopathy   DNR (do not resuscitate)  Urinary tract infection: Urine culture is growing Proteus Mirabella's which  is pansensitive, will transition from Rocephin to cephalexin and complete total 7 days of course.   Acute toxic encephalopathy  dementia with behavior disturbance: Patient noted to be less responsive than normal upon admission.  CT scan of the brain did not note any acute abnormalities. Patient is fully alert and she is also oriented that she is in the hospital but she could not tell me that name of the hospital, month or current ER.  Likely her baseline.   Generalized weakness: Acute on chronic.  PT OT recommends SNF, TOC consulted.   CKD stage IIIb: Patient's baseline creatinine is around 1.2 and she was at her baseline when she came in yesterday.  Please note that patient does not have acute kidney injury or acute renal insufficiency as mentioned in HPI.  This is her baseline creatinine.  Congestive heart failure with preserved EF: Chronic.   Chest x-ray noted cardiomegaly without signs of edema.  Last echocardiogram noted EF of 60- 65%.  She appears euvolemic.  Continue home dose of Aldactone and torsemide.   Depression and anxiety: Continue current regimen of Seroquel 70 mg nightly and trazodone as needed for sleepmedication regimen as tolerated however I will discontinue her Xanax as benzodiazepines are known to cause worsening of delirium.   Paroxysmal atrial fibrillation anticoagulation Status post pacemaker. Continue Eliquis and beta-blocker  DVT prophylaxis: apixaban (ELIQUIS) tablet 2.5 mg Start: 02/04/21 2200   Code Status: DNR  Family Communication:  None present at bedside.    Status is: Inpatient  Remains inpatient appropriate because: Needs SNF bed placement  Estimated body mass index is 40.42 kg/m as calculated from the following:   Height as of 05/08/20: 5\' 3"  (1.6 m).   Weight as of 05/08/20: 103.5 kg.    Nutritional Assessment: There is no height or weight on file to calculate BMI.. Seen by dietician.  I agree with the assessment and plan as outlined  below: Nutrition Status:        . Skin Assessment: I have examined the patient's skin and I agree with the wound assessment as performed by the wound care RN as outlined below:    Consultants:  None  Procedures:  Plan  Antimicrobials:  Anti-infectives (From admission, onward)    Start     Dose/Rate Route Frequency Ordered Stop   02/06/21 1400  cephALEXin (KEFLEX) capsule 500 mg        500 mg Oral Every 8 hours 02/06/21 1046 02/11/21 1359   02/04/21 1600  cefTRIAXone (ROCEPHIN) 2 g in sodium chloride 0.9 % 100 mL IVPB  Status:  Discontinued        2 g 200 mL/hr over 30 Minutes Intravenous Every 24 hours 02/04/21 1555 02/06/21 1046         Subjective:  Patient seen and examined.  Alert and oriented x1-2, fluctuates.  Has no complaints.  Looks comfortable.  Objective: Vitals:   02/05/21 1451 02/05/21 2018 02/06/21 0513 02/06/21 0749  BP: (!) 146/75 (!) 127/55 (!) 117/94 (!) 148/66  Pulse: 70 70 71 (!) 50  Resp: 16 16  17   Temp: 98 F (36.7 C) 98.5 F (36.9 C) 98.9 F (37.2 C) 98.1 F (36.7 C)  TempSrc: Oral Oral Oral   SpO2: 96% 90% 94% 96%    Intake/Output Summary (Last 24 hours) at 02/06/2021 1154 Last data filed at 02/06/2021 0518 Gross per 24 hour  Intake 120 ml  Output 1750 ml  Net -1630 ml    There were no vitals filed for this visit.  Examination:  General exam: Appears calm and comfortable  Respiratory system: Clear to auscultation. Respiratory effort normal. Cardiovascular system: S1 & S2 heard, irregularly irregular rate and rhythm. No JVD, murmurs, rubs, gallops or clicks. No pedal edema. Gastrointestinal system: Abdomen is nondistended, soft and nontender. No organomegaly or masses felt. Normal bowel sounds heard. Central nervous system: Alert and oriented x1. No focal neurological deficits. Extremities: Symmetric 5 x 5 power. Skin: No rashes, lesions or ulcers.  Psychiatry: Judgement and insight appear poor  Data Reviewed: I have  personally reviewed following labs and imaging studies  CBC: Recent Labs  Lab 02/04/21 1023 02/05/21 0153  WBC 7.4 7.5  NEUTROABS 4.3  --   HGB 15.1* 12.8  HCT 47.8* 39.1  MCV 99.2 96.1  PLT 192 [id number]    Basic Metabolic Panel: Recent Labs  Lab 02/04/21 1023 02/05/21 0153 02/06/21 0126  NA 137 138 142  K 4.3 3.9 4.7  CL 104 102 101  CO2 26 26 31   GLUCOSE 108* 100* 103*  BUN 26* 23 17  CREATININE 1.26* 1.15* 1.24*  CALCIUM 8.7* 8.4* 9.1    GFR: CrCl cannot be calculated (Unknown ideal weight.). Liver Function Tests: Recent Labs  Lab 02/04/21 1023  AST 15  ALT 11  ALKPHOS 118  BILITOT 0.8  PROT 7.3  ALBUMIN 3.8    No results for input(s): LIPASE, AMYLASE in the last 168 hours. No results for input(s): AMMONIA in the last 168 hours. Coagulation Profile: No results for input(s): INR, PROTIME in the last 168 hours. Cardiac  Enzymes: No results for input(s): CKTOTAL, CKMB, CKMBINDEX, TROPONINI in the last 168 hours. BNP (last 3 results) No results for input(s): PROBNP in the last 8760 hours. HbA1C: No results for input(s): HGBA1C in the last 72 hours. CBG: Recent Labs  Lab 02/04/21 1013  GLUCAP 99    Lipid Profile: No results for input(s): CHOL, HDL, LDLCALC, TRIG, CHOLHDL, LDLDIRECT in the last 72 hours. Thyroid Function Tests: No results for input(s): TSH, T4TOTAL, FREET4, T3FREE, THYROIDAB in the last 72 hours. Anemia Panel: No results for input(s): VITAMINB12, FOLATE, FERRITIN, TIBC, IRON, RETICCTPCT in the last 72 hours. Sepsis Labs: No results for input(s): PROCALCITON, LATICACIDVEN in the last 168 hours.  Recent Results (from the past 240 hour(s))  Urine Culture     Status: Abnormal   Collection Time: 02/04/21 11:20 AM   Specimen: Urine, Clean Catch  Result Value Ref Range Status   Specimen Description URINE, CLEAN CATCH  Final   Special Requests   Final    NONE Performed at [facility], [address] [place], [place] [address]     Culture >=100,000 COLONIES/mL PROTEUS MIRABILIS (A)  Final   Report Status 02/06/2021 FINAL  Final   Organism ID, Bacteria PROTEUS MIRABILIS (A)  Final      Susceptibility   Proteus mirabilis - MIC*    AMPICILLIN <=2 SENSITIVE Sensitive     CEFAZOLIN 8 SENSITIVE Sensitive     CEFEPIME <=0.12 SENSITIVE Sensitive     CEFTRIAXONE <=0.25 SENSITIVE Sensitive     CIPROFLOXACIN <=0.25 SENSITIVE Sensitive     GENTAMICIN <=1 SENSITIVE Sensitive     IMIPENEM 1 SENSITIVE Sensitive     NITROFURANTOIN 128 RESISTANT Resistant     TRIMETH/SULFA <=20 SENSITIVE Sensitive     AMPICILLIN/SULBACTAM <=2 SENSITIVE Sensitive     PIP/TAZO <=4 SENSITIVE Sensitive     * >=100,000 COLONIES/mL PROTEUS MIRABILIS  Resp Panel by RT-PCR (Flu A&B, Covid) Urine, In & Out Cath     Status: None   Collection Time: 02/04/21 12:46 PM   Specimen: Urine, In & Out Cath; Nasopharyngeal(NP) swabs in vial transport medium  Result Value Ref Range Status   SARS Coronavirus 2 by RT PCR NEGATIVE NEGATIVE Final    Comment: (NOTE) SARS-CoV-2 target nucleic acids are NOT DETECTED.  The SARS-CoV-2 RNA is generally detectable in upper respiratory specimens during the acute phase of infection. The lowest concentration of SARS-CoV-2 viral copies this assay can detect is 138 copies/mL. A negative result does not preclude SARS-Cov-2 infection and should not be used as the sole basis for treatment or other patient management decisions. A negative result may occur with  improper specimen collection/handling, submission of specimen other than nasopharyngeal swab, presence of viral mutation(s) within the areas targeted by this assay, and inadequate number of viral copies(<138 copies/mL). A negative result must be combined with clinical observations, patient history, and epidemiological information. The expected result is Negative.  Fact Sheet for Patients:  EntrepreneurPulse.com.au  Fact Sheet for Healthcare Providers:   IncredibleEmployment.be  This test is no t yet approved or cleared by the [place] FDA and  has been authorized for detection and/or diagnosis of SARS-CoV-2 by FDA under an Emergency Use Authorization (EUA). This EUA will remain  in effect (meaning this test can be used) for the duration of the COVID-19 declaration under Section 564(b)(1) of the Act, 21 U.S.C.section 360bbb-3(b)(1), unless the authorization is terminated  or revoked sooner.       Influenza A by PCR NEGATIVE  NEGATIVE Final   Influenza B by PCR NEGATIVE NEGATIVE Final    Comment: (NOTE) The Xpert Xpress SARS-CoV-2/FLU/RSV plus assay is intended as an aid in the diagnosis of influenza from Nasopharyngeal swab specimens and should not be used as a sole basis for treatment. Nasal washings and aspirates are unacceptable for Xpert Xpress SARS-CoV-2/FLU/RSV testing.  Fact Sheet for Patients: EntrepreneurPulse.com.au  Fact Sheet for Healthcare Providers: IncredibleEmployment.be  This test is not yet approved or cleared by the [place] FDA and has been authorized for detection and/or diagnosis of SARS-CoV-2 by FDA under an Emergency Use Authorization (EUA). This EUA will remain in effect (meaning this test can be used) for the duration of the COVID-19 declaration under Section 564(b)(1) of the Act, 21 U.S.C. section 360bbb-3(b)(1), unless the authorization is terminated or revoked.  Performed at [facility], [place] [address], [place], [place] [address]       Radiology Studies: No results found.  Scheduled Meds:  apixaban  2.5 mg Oral BID   aspirin  81 mg Oral Daily   cephALEXin  500 mg Oral Q8H   DULoxetine  60 mg Oral Daily   lipase/protease/amylase  36,000 Units Oral TID WC   loratadine  10 mg Oral Daily   metoprolol tartrate  25 mg Oral BID   nystatin  1 application Topical BID   pantoprazole  40 mg Oral Daily   QUEtiapine  75 mg Oral  QHS   saccharomyces boulardii  250 mg Oral Daily   sodium chloride flush  3 mL Intravenous Q12H   spironolactone  50 mg Oral Daily   torsemide  10 mg Oral Daily   Continuous Infusions:     LOS: 2 days   Time spent: 28 minutes  Darliss Cheney, MD [facility]  02/06/2021, 11:54 AM  Please page via Shea [facility] and do not message via secure chat for anything urgent. Secure chat can be used for anything non urgent.  How to contact the [facility] Attending or Consulting provider [place] or covering provider during after hours [place], for this patient?  Check the care team in [facility] and look for a) attending/consulting TRH provider listed and b) the [facility] team listed. Page or secure chat [phone]. Log into [email or web address] and use [place]'s universal password to access. If you do not have the password, please contact the hospital operator. Locate the [facility] provider you are looking for under [facility] and page to a number that you can be directly reached. If you still have difficulty reaching the provider, please page the [facility] (Director on Call) for the Hospitalists listed on amion for assistance.

## 2021-02-06 NOTE — Discharge Instructions (Signed)

## 2021-02-06 NOTE — TOC Progression Note (Signed)
Transition of Care [facility]) - Progression Note    Patient Details  Name: Jessicalynn Deshong MRN: [id number] Date of Birth: Aug 18, 1926  Transition of Care [facility]) CM/SW Contact  Jimmy Picket, [place] Phone Number: 02/06/2021, 4:20 PM  Clinical Narrative:     [facility] does not have a SNF bed for the pt. Pt is on the list for a LTC bed, there are still people ahead of her at this time.   CSW discussed above information with pts son. CSW gave [name] her one bed offer, [facility]. [facility] stated they can take the pt for SNF then move her to LTC until a bed opened at [facility]. [name] stated he will discuss with his family. [name] also requested CIR to llok at her for admission. Robert requested to send clinicals to [facility] to see if they can accept her at her current condition.   CSW will continue to follow.   Expected Discharge Plan: Skilled Nursing Facility Barriers to Discharge: Continued Medical Work up, English as a second language teacher  Expected Discharge Plan and Services Expected Discharge Plan: Skilled Nursing Facility       Living arrangements for the past 2 months: Assisted Living Facility                                       Social Determinants of Health (SDOH) Interventions    Readmission Risk Interventions No flowsheet data found.

## 2021-02-06 NOTE — Progress Notes (Addendum)
Pt has been very confused, restless, pulled IV out and continues to pull Tele off many times this shift.Has attempted to get OOB twice. Bed alarm on and remains on with floor mats in place. Incontinent of urine. Will continue to monitor.

## 2021-02-06 NOTE — Progress Notes (Signed)
Occupational Therapy Treatment Patient Details Name: Deborah Chung MRN: [id number] DOB: 05-03-1926 Today's Date: 02/06/2021   History of present illness Patient is a 88 y/o female who presents on 02/04/21 due to AMS, speech disorder and weakness. Found to have UTI and acute metabolic encephalopathy with dementia and behavorial disturbance. PMH includes dementia, A-fib, HTN, CHF with pacemaker, CKD stage 3, depression, hearing loss, OA.   OT comments  Pt session limited today by nausea and fatigue. Max encouragement to assist pt to EOB for seated ADL, however pt refusing due to nausea. Pt requesting socks donned, becomes agitated when therapist dons pt's socks, requiring max A for LB ADL. Pt able to complete bed level grooming task with set up A, needing max A +2 for bed mobility to assist pt scooting up in bed. Pt presenting with impairments listed below, will follow acutely. Continue to recommend SNF at d/c.   Recommendations for follow up therapy are one component of a multi-disciplinary discharge planning process, led by the attending physician.  Recommendations may be updated based on patient status, additional functional criteria and insurance authorization.    Follow Up Recommendations  Skilled nursing-short term rehab (<3 hours/day)    Assistance Recommended at Discharge Intermittent Supervision/Assistance  Patient can return home with the following  A lot of help with walking and/or transfers;A lot of help with bathing/dressing/bathroom;Assistance with cooking/housework;Help with stairs or ramp for entrance;Assist for transportation   Equipment Recommendations  None recommended by OT;Other (comment) (TBD at next venue of care)    Recommendations for Other Services PT consult    Precautions / Restrictions Precautions Precautions: Fall Restrictions Weight Bearing Restrictions: No       Mobility Bed Mobility Overal bed mobility: Needs Assistance Bed Mobility: Rolling            General bed mobility comments: Max A +2 to assist scooting pt up in bed    Transfers                   General transfer comment: deferred due to pt agitation/nausea     Balance                                           ADL either performed or assessed with clinical judgement   ADL Overall ADL's : Needs assistance/impaired     Grooming: Set up;Bed level;Wash/dry hands;Wash/dry face Grooming Details (indicate cue type and reason): able to wash face             Lower Body Dressing: Total assistance;Bed level Lower Body Dressing Details (indicate cue type and reason): to don/doff socks             Functional mobility during ADLs: Moderate assistance;Maximal assistance      Extremity/Trunk Assessment Upper Extremity Assessment Upper Extremity Assessment: Generalized weakness   Lower Extremity Assessment Lower Extremity Assessment: Defer to PT evaluation        Vision   Vision Assessment?: No apparent visual deficits   Perception Perception Perception: Not tested   Praxis Praxis Praxis: Not tested    Cognition Arousal/Alertness: Awake/alert Behavior During Therapy: WFL for tasks assessed/performed, Agitated, Restless Overall Cognitive Status: No family/caregiver present to determine baseline cognitive functioning  General Comments: asking therapist for assistance with task, then becomes agitated with therapist performing task, follows single step instructions with increased time        Exercises      Shoulder Instructions       General Comments pt reporting eyes are itchy and she is nauseaus, RN notified    Pertinent Vitals/ Pain       Pain Assessment Pain Assessment: Faces Pain Score: 5  Faces Pain Scale: Hurts even more Pain Location: irritated by itchy eyes and nausea Pain Descriptors / Indicators: Grimacing, Guarding, Sore, Discomfort Pain Intervention(s): Limited  activity within patient's tolerance, Monitored during session, Repositioned  Home Living                                          Prior Functioning/Environment              Frequency  Min 2X/week        Progress Toward Goals  OT Goals(current goals can now be found in the care plan section)  Progress towards OT goals: Progressing toward goals  Acute Rehab OT Goals Patient Stated Goal: none stated OT Goal Formulation: With patient Time For Goal Achievement: 02/19/21 Potential to Achieve Goals: Fair ADL Goals Pt Will Perform Upper Body Dressing: with min guard assist;sitting Pt Will Perform Lower Body Dressing: with max assist;sitting/lateral leans Pt Will Transfer to Toilet: with min assist;with +2 assist;bedside commode;stand pivot transfer  Plan Discharge plan remains appropriate;Frequency remains appropriate    Co-evaluation                 AM-PAC OT "6 Clicks" Daily Activity     Outcome Measure   Help from another person eating meals?: A Little Help from another person taking care of personal grooming?: A Little Help from another person toileting, which includes using toliet, bedpan, or urinal?: A Lot Help from another person bathing (including washing, rinsing, drying)?: A Lot Help from another person to put on and taking off regular upper body clothing?: A Lot Help from another person to put on and taking off regular lower body clothing?: Total 6 Click Score: 13    End of Session    OT Visit Diagnosis: Unsteadiness on feet (R26.81);Other abnormalities of gait and mobility (R26.89)   Activity Tolerance Treatment limited secondary to agitation;Patient limited by fatigue   Patient Left in bed;with call bell/phone within reach;with bed alarm set;with nursing/sitter in room   Nurse Communication Mobility status        Time: [id number] OT Time Calculation (min): 13 min  Charges: OT General Charges $OT Visit: 1 Visit OT  Treatments $Self Care/Home Management : 8-22 mins  Lynnda Child, OTD, OTR/L Acute Rehab [phone]) [phone]   Kaylyn Lim 02/06/2021, 4:58 PM

## 2021-02-07 DIAGNOSIS — I480 Paroxysmal atrial fibrillation: Secondary | ICD-10-CM | POA: Diagnosis not present

## 2021-02-07 DIAGNOSIS — N390 Urinary tract infection, site not specified: Secondary | ICD-10-CM | POA: Diagnosis not present

## 2021-02-07 NOTE — TOC Progression Note (Signed)
Transition of Care [facility]) - Progression Note    Patient Details  Name: Kaziyah Pignotti MRN: [id number] Date of Birth: [birth year]  Transition [facility]) CM/SW Contact  Emeterio Reeve, [place] Phone Number: 02/07/2021, 4:27 PM  Clinical Narrative:     Pt son Herbie [place] refuses bed offers. CSW informed Herbie [place] that pt is medically stable for DC.   Expected Discharge Plan: [place] Barriers to Discharge: Continued Medical Work up, Ship broker  Expected Discharge Plan and Services Expected Discharge Plan: [place]       Living arrangements for the past 2 months: [place]                                       Social Determinants of Health (SDOH) Interventions    Readmission Risk Interventions No flowsheet data found.  Emeterio Reeve, LCSW Clinical Social Worker

## 2021-02-07 NOTE — Progress Notes (Signed)
PROGRESS NOTE    Deborah Chung  [id number] DOB: 22-Apr-1926 DOA: 02/04/2021 PCP: Janith Lima, MD   Brief Narrative:  Deborah Chung is a 88 y.o. female with medical history significant of hypertension, hyperlipidemia, systolic CHF, frequent UTIs, and dementia who presented after being found to have increased weakness and due to the patient being less responsive.  At baseline the understandability of her speech reportedly fluctuates and she had been previously able to help assist with transfers.  Over the last couple of months patient has been less able to help with transfers needing more assistance.  She had been placed on Seroquel due to intermittent combativeness  with staff members.  Son agrees patient's speech was more difficult to understand when she first came into the hospital than normal.  He reports that she had similar symptoms like this back in 03/2020 that were attributed to a urinary tract infection that improved with treatment.  He had already been looking into skilled nursing facilities as it was thought that she was getting to the point where she could no longer live in assisted living.    Upon admission to ED, patient was afebrile with stable vital signs.  Labs fairly within normal range.  CT scan of the brain showed no acute abnormalities.  Urinalysis was positive for large leukocytes many bacteria, and greater than 50 WBCs.  Influenza and COVID-19 screening were negative.  Chest x-ray noted cardiomegaly without acute process.  Patient had been given 500 mL normal saline IV fluid bolus.  Admitted under hospitalist service.  Assessment & Plan:   Principal Problem:   UTI (urinary tract infection) Active Problems:   Atrial fibrillation (HCC)   Essential hypertension, benign   Depression with anxiety   Dementia with behavioral disturbance   Acute metabolic encephalopathy   DNR (do not resuscitate)  Urinary tract infection: Urine culture is growing Proteus Mirabilis which is  pansensitive, transition from Rocephin to cephalexin and complete total 7 days of course.   Acute toxic-metabolic encephalopathy  dementia with behavior disturbance: Patient noted to be less responsive than normal upon admission.  CT scan of the brain did not note any acute abnormalities.  This is likely secondary to UTI.  Patient is fully alert and she is also oriented that she is in the hospital but she could not tell me that name of the hospital, month or current ER.  This is likely her baseline.   Generalized weakness: Acute on chronic.  PT OT recommends SNF, TOC consulted.  Awaiting bed placement.   CKD stage IIIb: Patient's baseline creatinine is around 1.2 and she was at her baseline when she came in yesterday.  Please note that patient does not have acute kidney injury or acute renal insufficiency as mentioned in HPI.  This is her baseline creatinine.  Congestive heart failure with preserved EF: Chronic.   Chest x-ray noted cardiomegaly without signs of edema.  Last echocardiogram noted EF of 60- 65%.  She appears euvolemic.  Continue home dose of Aldactone and torsemide.   Depression and anxiety: Continue current regimen of Seroquel 70 mg nightly and trazodone as needed for sleepmedication regimen as tolerated however I have discontinued her Xanax as benzodiazepines are known to cause worsening of delirium.   Paroxysmal atrial fibrillation anticoagulation Status post pacemaker. Continue Eliquis and beta-blocker  DVT prophylaxis: apixaban (ELIQUIS) tablet 2.5 mg Start: 02/04/21 2200   Code Status: DNR  Family Communication:  None present at bedside.    Status is: Inpatient  Remains  inpatient appropriate because: Needs SNF bed placement       Estimated body mass index is 39.44 kg/m as calculated from the following:   Height as of 05/08/20: 5\' 3"  (1.6 m).   Weight as of this encounter: 101 kg.    Nutritional Assessment: Body mass index is 39.44 kg/m.Marland Kitchen Seen by dietician.  I  agree with the assessment and plan as outlined below: Nutrition Status:        . Skin Assessment: I have examined the patient's skin and I agree with the wound assessment as performed by the wound care RN as outlined below:    Consultants:  None  Procedures:  Plan  Antimicrobials:  Anti-infectives (From admission, onward)    Start     Dose/Rate Route Frequency Ordered Stop   02/06/21 1400  cephALEXin (KEFLEX) capsule 500 mg        500 mg Oral Every 8 hours 02/06/21 1046 02/11/21 1359   02/04/21 1600  cefTRIAXone (ROCEPHIN) 2 g in sodium chloride 0.9 % 100 mL IVPB  Status:  Discontinued        2 g 200 mL/hr over 30 Minutes Intravenous Every 24 hours 02/04/21 1555 02/06/21 1046         Subjective:  Patient seen and examined.  She was alert and oriented to person.  No complaints.  No change compared to yesterday.  Objective: Vitals:   02/06/21 1954 02/07/21 0347 02/07/21 0451 02/07/21 0731  BP: (!) 120/96 131/70  (!) 143/73  Pulse: 68 70  70  Resp: 19 20    Temp: 97.8 F (36.6 C) (!) 97.5 F (36.4 C)  97.8 F (36.6 C)  TempSrc: Oral Oral    SpO2: 93% 94%  100%  Weight:   101 kg     Intake/Output Summary (Last 24 hours) at 02/07/2021 1253 Last data filed at 02/07/2021 [id number] Gross per 24 hour  Intake 100 ml  Output 950 ml  Net -850 ml    Filed Weights   02/07/21 0451  Weight: 101 kg    Examination:  General exam: Appears calm and comfortable  Respiratory system: Clear to auscultation. Respiratory effort normal. Cardiovascular system: S1 & S2 heard, RRR. No JVD, murmurs, rubs, gallops or clicks. No pedal edema. Gastrointestinal system: Abdomen is nondistended, soft and nontender. No organomegaly or masses felt. Normal bowel sounds heard. Central nervous system: Alert and oriented. No focal neurological deficits. Extremities: Symmetric 5 x 5 power. Skin: No rashes, lesions or ulcers.  Psychiatry: Judgement and insight appear poor  Data Reviewed: I have  personally reviewed following labs and imaging studies  CBC: Recent Labs  Lab 02/04/21 1023 02/05/21 0153  WBC 7.4 7.5  NEUTROABS 4.3  --   HGB 15.1* 12.8  HCT 47.8* 39.1  MCV 99.2 96.1  PLT 192 [id number]    Basic Metabolic Panel: Recent Labs  Lab 02/04/21 1023 02/05/21 0153 02/06/21 0126  NA 137 138 142  K 4.3 3.9 4.7  CL 104 102 101  CO2 26 26 31   GLUCOSE 108* 100* 103*  BUN 26* 23 17  CREATININE 1.26* 1.15* 1.24*  CALCIUM 8.7* 8.4* 9.1    GFR: CrCl cannot be calculated (Unknown ideal weight.). Liver Function Tests: Recent Labs  Lab 02/04/21 1023  AST 15  ALT 11  ALKPHOS 118  BILITOT 0.8  PROT 7.3  ALBUMIN 3.8    No results for input(s): LIPASE, AMYLASE in the last 168 hours. No results for input(s): AMMONIA in the last 168 hours.  Coagulation Profile: No results for input(s): INR, PROTIME in the last 168 hours. Cardiac Enzymes: No results for input(s): CKTOTAL, CKMB, CKMBINDEX, TROPONINI in the last 168 hours. BNP (last 3 results) No results for input(s): PROBNP in the last 8760 hours. HbA1C: No results for input(s): HGBA1C in the last 72 hours. CBG: Recent Labs  Lab 02/04/21 1013  GLUCAP 99    Lipid Profile: No results for input(s): CHOL, HDL, LDLCALC, TRIG, CHOLHDL, LDLDIRECT in the last 72 hours. Thyroid Function Tests: No results for input(s): TSH, T4TOTAL, FREET4, T3FREE, THYROIDAB in the last 72 hours. Anemia Panel: No results for input(s): VITAMINB12, FOLATE, FERRITIN, TIBC, IRON, RETICCTPCT in the last 72 hours. Sepsis Labs: No results for input(s): PROCALCITON, LATICACIDVEN in the last 168 hours.  Recent Results (from the past 240 hour(s))  Urine Culture     Status: Abnormal   Collection Time: 02/04/21 11:20 AM   Specimen: Urine, Clean Catch  Result Value Ref Range Status   Specimen Description URINE, CLEAN CATCH  Final   Special Requests   Final    NONE Performed at [facility], [address] [place], [place] [address]     Culture >=100,000 COLONIES/mL PROTEUS MIRABILIS (A)  Final   Report Status 02/06/2021 FINAL  Final   Organism ID, Bacteria PROTEUS MIRABILIS (A)  Final      Susceptibility   Proteus mirabilis - MIC*    AMPICILLIN <=2 SENSITIVE Sensitive     CEFAZOLIN 8 SENSITIVE Sensitive     CEFEPIME <=0.12 SENSITIVE Sensitive     CEFTRIAXONE <=0.25 SENSITIVE Sensitive     CIPROFLOXACIN <=0.25 SENSITIVE Sensitive     GENTAMICIN <=1 SENSITIVE Sensitive     IMIPENEM 1 SENSITIVE Sensitive     NITROFURANTOIN 128 RESISTANT Resistant     TRIMETH/SULFA <=20 SENSITIVE Sensitive     AMPICILLIN/SULBACTAM <=2 SENSITIVE Sensitive     PIP/TAZO <=4 SENSITIVE Sensitive     * >=100,000 COLONIES/mL PROTEUS MIRABILIS  Resp Panel by RT-PCR (Flu A&B, Covid) Urine, In & Out Cath     Status: None   Collection Time: 02/04/21 12:46 PM   Specimen: Urine, In & Out Cath; Nasopharyngeal(NP) swabs in vial transport medium  Result Value Ref Range Status   SARS Coronavirus 2 by RT PCR NEGATIVE NEGATIVE Final    Comment: (NOTE) SARS-CoV-2 target nucleic acids are NOT DETECTED.  The SARS-CoV-2 RNA is generally detectable in upper respiratory specimens during the acute phase of infection. The lowest concentration of SARS-CoV-2 viral copies this assay can detect is 138 copies/mL. A negative result does not preclude SARS-Cov-2 infection and should not be used as the sole basis for treatment or other patient management decisions. A negative result may occur with  improper specimen collection/handling, submission of specimen other than nasopharyngeal swab, presence of viral mutation(s) within the areas targeted by this assay, and inadequate number of viral copies(<138 copies/mL). A negative result must be combined with clinical observations, patient history, and epidemiological information. The expected result is Negative.  Fact Sheet for Patients:  BloggerCourse.com  Fact Sheet for Healthcare Providers:   SeriousBroker.it  This test is no t yet approved or cleared by the [place] FDA and  has been authorized for detection and/or diagnosis of SARS-CoV-2 by FDA under an Emergency Use Authorization (EUA). This EUA will remain  in effect (meaning this test can be used) for the duration of the COVID-19 declaration under Section 564(b)(1) of the Act, 21 U.S.C.section 360bbb-3(b)(1), unless the authorization is terminated  or revoked sooner.       Influenza A by PCR NEGATIVE NEGATIVE Final   Influenza B by PCR NEGATIVE NEGATIVE Final    Comment: (NOTE) The Xpert Xpress SARS-CoV-2/FLU/RSV plus assay is intended as an aid in the diagnosis of influenza from Nasopharyngeal swab specimens and should not be used as a sole basis for treatment. Nasal washings and aspirates are unacceptable for Xpert Xpress SARS-CoV-2/FLU/RSV testing.  Fact Sheet for Patients: EntrepreneurPulse.com.au  Fact Sheet for Healthcare Providers: IncredibleEmployment.be  This test is not yet approved or cleared by the [place] FDA and has been authorized for detection and/or diagnosis of SARS-CoV-2 by FDA under an Emergency Use Authorization (EUA). This EUA will remain in effect (meaning this test can be used) for the duration of the COVID-19 declaration under Section 564(b)(1) of the Act, 21 U.S.C. section 360bbb-3(b)(1), unless the authorization is terminated or revoked.  Performed at [facility], [place] [address] [place], [place] [address]       Radiology Studies: No results found.  Scheduled Meds:  apixaban  2.5 mg Oral BID   aspirin  81 mg Oral Daily   cephALEXin  500 mg Oral Q8H   DULoxetine  60 mg Oral Daily   lipase/protease/amylase  36,000 Units Oral TID WC   loratadine  10 mg Oral Daily   metoprolol tartrate  25 mg Oral BID   nystatin  1 application Topical BID   pantoprazole  40 mg Oral Daily   QUEtiapine  75 mg Oral  QHS   saccharomyces boulardii  250 mg Oral Daily   sodium chloride flush  3 mL Intravenous Q12H   spironolactone  50 mg Oral Daily   torsemide  10 mg Oral Daily   Continuous Infusions:     LOS: 3 days   Time spent: 25 minutes  Darliss Cheney, MD [facility]  02/07/2021, 12:53 PM  Please page via Shea Evans and do not message via secure chat for anything urgent. Secure chat can be used for anything non urgent.  How to contact the [facility] Attending or Consulting provider [place] or covering provider during after hours [place], for this patient?  Check the care team in [facility] and look for a) attending/consulting TRH provider listed and b) the [facility] team listed. Page or secure chat [phone]. Log into [email or web address] and use [place]'s universal password to access. If you do not have the password, please contact the hospital operator. Locate the [facility] provider you are looking for under [facility] and page to a number that you can be directly reached. If you still have difficulty reaching the provider, please page the [facility] (Director on Call) for the Hospitalists listed on amion for assistance.

## 2021-02-07 NOTE — Care Management Important Message (Signed)
Important Message  Patient Details  Name: Deborah Chung MRN: [id number] Date of Birth: [birth year]   Medicare Important Message Given:  Yes     [name] 02/07/2021, 10:55 AM

## 2021-02-08 LAB — RESP PANEL BY RT-PCR (FLU A&B, COVID) ARPGX2
Influenza A by PCR: NEGATIVE
Influenza B by PCR: NEGATIVE
SARS Coronavirus 2 by RT PCR: NEGATIVE

## 2021-02-08 NOTE — Progress Notes (Signed)
Physical Therapy Treatment Patient Details Name: Livingston Senters MRN: [id number] DOB: Sep 18, 1926 Today's Date: 02/08/2021   History of Present Illness Patient is a 88 y/o female who presents on 02/04/21 due to AMS, speech disorder and weakness. Found to have UTI and acute metabolic encephalopathy with dementia and behavorial disturbance. PMH includes dementia, A-fib, HTN, CHF with pacemaker, CKD stage 3, depression, hearing loss, OA.    PT Comments    Pt was seen for attempts to stand after she agreed to sit up on side of bed.  Pt declined after agreeing and did a few LE stretches, then agreed to try again.  Standing is challenging due to her active resistance, as pt is actually fairly strong in her trunk.  Follow up with her to continue to encourage gait and transfers, to work toward PLOF and to transition to SNF care for the time and effort she will need to recover independence. Follow up as her goals of acute PT are outlining care.   Recommendations for follow up therapy are one component of a multi-disciplinary discharge planning process, led by the attending physician.  Recommendations may be updated based on patient status, additional functional criteria and insurance authorization.  Follow Up Recommendations  Skilled nursing-short term rehab (<3 hours/day)     Assistance Recommended at Discharge Frequent or constant Supervision/Assistance  Patient can return home with the following Two people to help with walking and/or transfers;Two people to help with bathing/dressing/bathroom;Assistance with cooking/housework;Assist for transportation;Help with stairs or ramp for entrance;Direct supervision/assist for medications management   Equipment Recommendations  None recommended by PT    Recommendations for Other Services       Precautions / Restrictions Precautions Precautions: Fall Precaution Comments: pt has tendency to try to squeeze hands very hard Restrictions Weight Bearing  Restrictions: No     Mobility  Bed Mobility Overal bed mobility: Needs Assistance Bed Mobility: Rolling, Sidelying to Sit, Sit to Sidelying Rolling: Max assist, +2 for physical assistance, +2 for safety/equipment     Sit to supine: Max assist, +2 for physical assistance, +2 for safety/equipment Sit to sidelying: Max assist, +2 for physical assistance, +2 for safety/equipment General bed mobility comments: pt is reluctant to move once up and then begins to push back heavily with her trunk    Transfers Overall transfer level: Needs assistance Equipment used: Rolling walker (2 wheels) Transfers: Sit to/from Stand Sit to Stand: Total assist, +2 physical assistance, +2 safety/equipment           General transfer comment: attempted but unsuccessful    Ambulation/Gait               General Gait Details: declines to try   Stairs             Wheelchair Mobility    Modified Rankin (Stroke Patients Only)       Balance Overall balance assessment: Needs assistance Sitting-balance support: Feet supported Sitting balance-Leahy Scale: Fair     Standing balance support: Bilateral upper extremity supported, During functional activity Standing balance-Leahy Scale: Zero                              Cognition Arousal/Alertness: Lethargic, Awake/alert Behavior During Therapy: Agitated, Flat affect Overall Cognitive Status: No family/caregiver present to determine baseline cognitive functioning  General Comments: pt expressed interest in getting OOB and then declines help after getting to side of bed        Exercises General Exercises - Lower Extremity Ankle Circles/Pumps: AAROM, 5 reps Heel Slides: AAROM, 10 reps    General Comments General comments (skin integrity, edema, etc.): pt was attempted to stand with nursing after agreeing to try and then began to resist heavily, changed her mind and then  declined again      Pertinent Vitals/Pain Pain Assessment Pain Assessment: Faces Faces Pain Scale: Hurts a little bit Pain Location: general reluctance to move Pain Descriptors / Indicators: Guarding Pain Intervention(s): Monitored during session, Repositioned, Limited activity within patient's tolerance    Home Living                          Prior Function            PT Goals (current goals can now be found in the care plan section) Acute Rehab PT Goals Patient Stated Goal: to stay in bed    Frequency    Min 2X/week      PT Plan Current plan remains appropriate    Co-evaluation              AM-PAC PT "6 Clicks" Mobility   Outcome Measure  Help needed turning from your back to your side while in a flat bed without using bedrails?: Total Help needed moving from lying on your back to sitting on the side of a flat bed without using bedrails?: Total Help needed moving to and from a bed to a chair (including a wheelchair)?: Total Help needed standing up from a chair using your arms (e.g., wheelchair or bedside chair)?: Total Help needed to walk in hospital room?: Total Help needed climbing 3-5 steps with a railing? : Total 6 Click Score: 6    End of Session Equipment Utilized During Treatment: Gait belt Activity Tolerance: Patient limited by fatigue;Patient limited by lethargy Patient left: in bed;with call bell/phone within reach;with bed alarm set Nurse Communication: Mobility status PT Visit Diagnosis: Pain;Muscle weakness (generalized) (M62.81);Unsteadiness on feet (R26.81) Pain - Right/Left: Left (generally) Pain - part of body: Leg     Time: [id number] PT Time Calculation (min) (ACUTE ONLY): 24 min  Charges:  $Therapeutic Activity: 23-37 mins             Ramond Dial 02/08/2021, 5:27 PM  Mee Hives, PT PhD Acute Rehab Dept. Number: [place]

## 2021-02-08 NOTE — TOC Progression Note (Signed)
Transition of Care [facility]) - Progression Note    Patient Details  Name: Deborah Chung MRN: [id number] Date of Birth: [birth year]  Transition of Care [facility]) CM/SW Contact  Jimmy Picket, [place] Phone Number: 02/08/2021, 1:05 PM  Clinical Narrative:     CSW spoe with pts son on phone. [name] states he spoke to someone at [facility] and they have a bed for pt. CSW confirmed with admissions coordinator. They can accept pt tomorrow at [place]. CSW started insurance auth. CSW requested covid test from MD.   Expected Discharge Plan: Skilled Nursing Facility Barriers to Discharge: Continued Medical Work up, English as a second language teacher  Expected Discharge Plan and Services Expected Discharge Plan: Skilled Nursing Facility       Living arrangements for the past 2 months: Assisted Living Facility                                       Social Determinants of Health (SDOH) Interventions    Readmission Risk Interventions No flowsheet data found.  Jimmy Picket, LCSW Clinical Social Worker

## 2021-02-08 NOTE — Progress Notes (Signed)
PROGRESS NOTE    Deborah Chung  [id number] DOB: 1926/05/12 DOA: 02/04/2021 PCP: Janith Lima, MD   Brief Narrative:  Deborah Chung is a 88 y.o. female with medical history significant of hypertension, hyperlipidemia, systolic CHF, frequent UTIs, and dementia brought to the hospital with increased weakness and less responsiveness.    Upon admission to ED, patient was afebrile with stable vital signs.  Labs fairly within normal range.  CT scan of the brain showed no acute abnormalities.  Urinalysis was positive for large leukocytes many bacteria, and greater than 50 WBCs.  Influenza and COVID-19 screening were negative.  Chest x-ray noted cardiomegaly without acute process.  Patient had been given 500 mL normal saline IV fluid bolus.  Admitted under hospitalist service.  Assessment & Plan:   Principal Problem:   UTI (urinary tract infection) Active Problems:   Atrial fibrillation (HCC)   Essential hypertension, benign   Depression with anxiety   Dementia with behavioral disturbance   Acute metabolic encephalopathy   DNR (do not resuscitate)  Urinary tract infection: Urine culture with Proteus mirabilis, treated with Rocephin and converted to cephalexin for total 7 days.  Clinically improving.  Acute toxic-metabolic encephalopathy  dementia with behavior disturbance: Patient noted to be less responsive than normal upon admission.  CT scan of the brain did not note any acute abnormalities.  This is likely secondary to UTI.  Patient is fully alert and she is also oriented that she is in the hospital but she could not tell me that name of the hospital, month or current ER.  This is likely her baseline.   Generalized weakness: Acute on chronic.  PT OT recommends SNF, TOC consulted.  Awaiting bed placement.   CKD stage IIIb: Stable.  At about baseline.  Congestive heart failure with preserved EF: Chronic.   Chest x-ray noted cardiomegaly without signs of edema.  Last echocardiogram  noted EF of 60- 65%.  She appears euvolemic.  Continue home dose of Aldactone and torsemide.   Depression and anxiety: Continue current regimen of Seroquel 70 mg nightly and trazodone as needed for sleepmedication regimen as tolerated however    Paroxysmal atrial fibrillation anticoagulation Status post pacemaker. Continue Eliquis and beta-blocker  DVT prophylaxis: apixaban (ELIQUIS) tablet 2.5 mg Start: 02/04/21 2200   Code Status: DNR  Family Communication:  None present at bedside.    Status is: Inpatient  Remains inpatient appropriate because: Needs SNF bed placement       Estimated body mass index is 38.47 kg/m as calculated from the following:   Height as of 05/08/20: 5\' 3"  (1.6 m).   Weight as of this encounter: 98.5 kg.    Nutritional Assessment: Body mass index is 38.47 kg/m.Marland Kitchen Seen by dietician.  I agree with the assessment and plan as outlined below: Nutrition Status:        . Skin Assessment: I have examined the patient's skin and I agree with the wound assessment as performed by the wound care RN as outlined below:    Consultants:  None  Procedures:  Plan  Antimicrobials:  Anti-infectives (From admission, onward)    Start     Dose/Rate Route Frequency Ordered Stop   02/06/21 1400  cephALEXin (KEFLEX) capsule 500 mg        500 mg Oral Every 8 hours 02/06/21 1046 02/11/21 1359   02/04/21 1600  cefTRIAXone (ROCEPHIN) 2 g in sodium chloride 0.9 % 100 mL IVPB  Status:  Discontinued  2 g 200 mL/hr over 30 Minutes Intravenous Every 24 hours 02/04/21 1555 02/06/21 1046         Subjective:  Patient seen and examined.  No overnight events.  She will not willing to keep up conversation.   Objective: Vitals:   02/07/21 0731 02/08/21 0108 02/08/21 0636 02/08/21 0757  BP: (!) 143/73 (!) 119/56 128/61 129/64  Pulse: 70 69 70 70  Resp:  20 20 20   Temp: 97.8 F (36.6 C) (!) 97.5 F (36.4 C) 98 F (36.7 C) 98.1 F (36.7 C)  TempSrc:  Oral Oral  Oral  SpO2: 100% 100% 95% 93%  Weight:   98.5 kg    No intake or output data in the 24 hours ending 02/08/21 1427 Filed Weights   02/07/21 0451 02/08/21 0636  Weight: 101 kg 98.5 kg    Examination:  General: Patient looks fairly comfortable and sleepy. Alert on awakening but not oriented.  Frail and debilitated.  Chronically sick looking. Cardiovascular: S1-S2 normal.  Regular rate rhythm. Respiratory: Bilateral clear.  No added sounds. Gastrointestinal: Soft and nontender.  Bowel sound present. Ext: No deformities.  No edema or swelling.  Data Reviewed: I have personally reviewed following labs and imaging studies  CBC: Recent Labs  Lab 02/04/21 1023 02/05/21 0153  WBC 7.4 7.5  NEUTROABS 4.3  --   HGB 15.1* 12.8  HCT 47.8* 39.1  MCV 99.2 96.1  PLT 192 [id number]   Basic Metabolic Panel: Recent Labs  Lab 02/04/21 1023 02/05/21 0153 02/06/21 0126  NA 137 138 142  K 4.3 3.9 4.7  CL 104 102 101  CO2 26 26 31   GLUCOSE 108* 100* 103*  BUN 26* 23 17  CREATININE 1.26* 1.15* 1.24*  CALCIUM 8.7* 8.4* 9.1   GFR: CrCl cannot be calculated (Unknown ideal weight.). Liver Function Tests: Recent Labs  Lab 02/04/21 1023  AST 15  ALT 11  ALKPHOS 118  BILITOT 0.8  PROT 7.3  ALBUMIN 3.8   No results for input(s): LIPASE, AMYLASE in the last 168 hours. No results for input(s): AMMONIA in the last 168 hours. Coagulation Profile: No results for input(s): INR, PROTIME in the last 168 hours. Cardiac Enzymes: No results for input(s): CKTOTAL, CKMB, CKMBINDEX, TROPONINI in the last 168 hours. BNP (last 3 results) No results for input(s): PROBNP in the last 8760 hours. HbA1C: No results for input(s): HGBA1C in the last 72 hours. CBG: Recent Labs  Lab 02/04/21 1013  GLUCAP 99   Lipid Profile: No results for input(s): CHOL, HDL, LDLCALC, TRIG, CHOLHDL, LDLDIRECT in the last 72 hours. Thyroid Function Tests: No results for input(s): TSH, T4TOTAL, FREET4, T3FREE, THYROIDAB  in the last 72 hours. Anemia Panel: No results for input(s): VITAMINB12, FOLATE, FERRITIN, TIBC, IRON, RETICCTPCT in the last 72 hours. Sepsis Labs: No results for input(s): PROCALCITON, LATICACIDVEN in the last 168 hours.  Recent Results (from the past 240 hour(s))  Urine Culture     Status: Abnormal   Collection Time: 02/04/21 11:20 AM   Specimen: Urine, Clean Catch  Result Value Ref Range Status   Specimen Description URINE, CLEAN CATCH  Final   Special Requests   Final    NONE Performed at [facility], [address], [place], [place] [address]    Culture >=100,000 COLONIES/mL PROTEUS MIRABILIS (A)  Final   Report Status 02/06/2021 FINAL  Final   Organism ID, Bacteria PROTEUS MIRABILIS (A)  Final      Susceptibility   Proteus mirabilis -  MIC*    AMPICILLIN <=2 SENSITIVE Sensitive     CEFAZOLIN 8 SENSITIVE Sensitive     CEFEPIME <=0.12 SENSITIVE Sensitive     CEFTRIAXONE <=0.25 SENSITIVE Sensitive     CIPROFLOXACIN <=0.25 SENSITIVE Sensitive     GENTAMICIN <=1 SENSITIVE Sensitive     IMIPENEM 1 SENSITIVE Sensitive     NITROFURANTOIN 128 RESISTANT Resistant     TRIMETH/SULFA <=20 SENSITIVE Sensitive     AMPICILLIN/SULBACTAM <=2 SENSITIVE Sensitive     PIP/TAZO <=4 SENSITIVE Sensitive     * >=100,000 COLONIES/mL PROTEUS MIRABILIS  Resp Panel by RT-PCR (Flu A&B, Covid) Urine, In & Out Cath     Status: None   Collection Time: 02/04/21 12:46 PM   Specimen: Urine, In & Out Cath; Nasopharyngeal(NP) swabs in vial transport medium  Result Value Ref Range Status   SARS Coronavirus 2 by RT PCR NEGATIVE NEGATIVE Final    Comment: (NOTE) SARS-CoV-2 target nucleic acids are NOT DETECTED.  The SARS-CoV-2 RNA is generally detectable in upper respiratory specimens during the acute phase of infection. The lowest concentration of SARS-CoV-2 viral copies this assay can detect is 138 copies/mL. A negative result does not preclude SARS-Cov-2 infection and should not be used as the  sole basis for treatment or other patient management decisions. A negative result may occur with  improper specimen collection/handling, submission of specimen other than nasopharyngeal swab, presence of viral mutation(s) within the areas targeted by this assay, and inadequate number of viral copies(<138 copies/mL). A negative result must be combined with clinical observations, patient history, and epidemiological information. The expected result is Negative.  Fact Sheet for Patients:  EntrepreneurPulse.com.au  Fact Sheet for Healthcare Providers:  IncredibleEmployment.be  This test is no t yet approved or cleared by the [place] FDA and  has been authorized for detection and/or diagnosis of SARS-CoV-2 by FDA under an Emergency Use Authorization (EUA). This EUA will remain  in effect (meaning this test can be used) for the duration of the COVID-19 declaration under Section 564(b)(1) of the Act, 21 U.S.C.section 360bbb-3(b)(1), unless the authorization is terminated  or revoked sooner.       Influenza A by PCR NEGATIVE NEGATIVE Final   Influenza B by PCR NEGATIVE NEGATIVE Final    Comment: (NOTE) The Xpert Xpress SARS-CoV-2/FLU/RSV plus assay is intended as an aid in the diagnosis of influenza from Nasopharyngeal swab specimens and should not be used as a sole basis for treatment. Nasal washings and aspirates are unacceptable for Xpert Xpress SARS-CoV-2/FLU/RSV testing.  Fact Sheet for Patients: EntrepreneurPulse.com.au  Fact Sheet for Healthcare Providers: IncredibleEmployment.be  This test is not yet approved or cleared by the [place] FDA and has been authorized for detection and/or diagnosis of SARS-CoV-2 by FDA under an Emergency Use Authorization (EUA). This EUA will remain in effect (meaning this test can be used) for the duration of the COVID-19 declaration under Section 564(b)(1) of the  Act, 21 U.S.C. section 360bbb-3(b)(1), unless the authorization is terminated or revoked.  Performed at [facility], [place] [address] [place], [place] [address]       Radiology Studies: No results found.  Scheduled Meds:  apixaban  2.5 mg Oral BID   aspirin  81 mg Oral Daily   cephALEXin  500 mg Oral Q8H   DULoxetine  60 mg Oral Daily   lipase/protease/amylase  36,000 Units Oral TID WC   loratadine  10 mg Oral Daily   metoprolol tartrate  25 mg Oral BID  nystatin  1 application Topical BID   pantoprazole  40 mg Oral Daily   QUEtiapine  75 mg Oral QHS   saccharomyces boulardii  250 mg Oral Daily   sodium chloride flush  3 mL Intravenous Q12H   spironolactone  50 mg Oral Daily   torsemide  10 mg Oral Daily   Continuous Infusions:     LOS: 4 days   Time spent: 25 minutes  Barb Merino, MD [facility]  02/08/2021, 2:27 PM

## 2021-02-09 LAB — BASIC METABOLIC PANEL
Anion gap: 10 (ref 5–15)
BUN: 22 mg/dL (ref 8–23)
CO2: 32 mmol/L (ref 22–32)
Calcium: 9.3 mg/dL (ref 8.9–10.3)
Chloride: 96 mmol/L — ABNORMAL LOW (ref 98–111)
Creatinine, Ser: 1.37 mg/dL — ABNORMAL HIGH (ref 0.44–1.00)
GFR, Estimated: 36 mL/min — ABNORMAL LOW (ref >60)
Glucose, Bld: 102 mg/dL — ABNORMAL HIGH (ref 70–99)
Potassium: 4.3 mmol/L (ref 3.5–5.1)
Sodium: 138 mmol/L (ref 135–145)

## 2021-02-09 MED ORDER — TRAZODONE HCL 50 MG PO TABS: 25.0000 mg | ORAL_TABLET | Freq: Every evening | ORAL | 0 refills | Status: DC | PRN

## 2021-02-09 MED ORDER — DIPHENOXYLATE-ATROPINE 2.5-0.025 MG PO TABS: 1.0000 | ORAL_TABLET | ORAL | 0 refills | Status: AC | PRN

## 2021-02-09 MED ORDER — TRAZODONE HCL 50 MG PO TABS: 25.0000 mg | ORAL_TABLET | Freq: Every evening | ORAL | 0 refills | Status: AC | PRN

## 2021-02-09 NOTE — Plan of Care (Signed)

## 2021-02-09 NOTE — Discharge Summary (Signed)
Physician Discharge Summary  Deborah Chung [id number] DOB: Sep 01, 1926 DOA: 02/04/2021  PCP: Janith Lima, MD  Admit date: 02/04/2021 Discharge date: 02/09/2021  Admitted From: From home Disposition: Skilled nursing facility  Recommendations for Outpatient Follow-up:  Follow up with PCP in 1-2 weeks Please obtain BMP/CBC in one week Please consult palliative care at skilled nursing facility.  Home Health: N/A Equipment/Devices: N/A  Discharge Condition: Fair CODE STATUS: DNR/DNI Diet recommendation: Low-salt diet, assist feeding.  Aspiration precautions.  Discharge summary: Deborah Chung is a 88 y.o. female with medical history significant of hypertension, hyperlipidemia, systolic CHF, frequent UTIs, and dementia brought to the hospital with increased weakness and less responsiveness.    Upon admission to ED, patient was afebrile with stable vital signs.  Labs fairly within normal range.  CT scan of the brain showed no acute abnormalities.  Urinalysis was positive for large leukocytes many bacteria, and greater than 50 WBCs.  Influenza and COVID-19 screening were negative.  Chest x-ray noted cardiomegaly without acute process.  Patient had been given 500 mL normal saline IV fluid bolus.  Admitted under hospitalist service.   Assessment & plan of care:   Urinary tract infection: Urine culture with Proteus mirabilis, treated with Rocephin and converted to cephalexin.  Patient has completed treatment while in the hospital.  No need to continue antibiotics.   Acute toxic-metabolic encephalopathy  dementia with behavior disturbance: Patient noted to be less responsive than normal upon admission.  CT scan of the brain did not note any acute abnormalities.  This is likely secondary to UTI. Patient is with progressive weakness and less interaction.  This is likely her baseline mental status now.  All-time fall precautions.  All-time delirium precautions.  Generalized weakness: Acute on  chronic.  PT OT recommends SNF.    CKD stage IIIb: Stable.  At about baseline.   Congestive heart failure with preserved EF: Chronic.   Chest x-ray noted cardiomegaly without signs of edema.  Last echocardiogram noted EF of 60- 65%.  She appears euvolemic.  Continue home dose of Aldactone and torsemide.   Depression and anxiety: Continue current regimen of Seroquel nightly and trazodone as needed for sleepmedication regimen as tolerated .  We will avoid benzodiazepines.   Paroxysmal atrial fibrillation anticoagulation Status post pacemaker. Continue Eliquis and beta-blocker  Patient is medically stable.  Has plenty of chronic medical issues and progressive dementia and high risk of readmission.  Can be transferred to skilled level of care.    Discharge Diagnoses:  Principal Problem:   UTI (urinary tract infection) Active Problems:   Atrial fibrillation (HCC)   Essential hypertension, benign   Depression with anxiety   Dementia with behavioral disturbance   Acute metabolic encephalopathy   DNR (do not resuscitate)    Discharge Instructions  Discharge Instructions     Diet - low sodium heart healthy   Complete by: As directed    Assist feeding   Increase activity slowly   Complete by: As directed       Allergies as of 02/09/2021   No Known Allergies      Medication List     STOP taking these medications    ALPRAZolam 0.25 MG tablet Commonly known as: XANAX   aspirin 81 MG chewable tablet   NON FORMULARY   NON FORMULARY   Omeprazole 20 MG Tbdd   pregabalin 75 MG capsule Commonly known as: LYRICA       TAKE these medications    acetaminophen 325 MG tablet  Commonly known as: TYLENOL Take 650 mg by mouth 2 (two) times daily as needed (pain).   apixaban 2.5 MG Tabs tablet Commonly known as: ELIQUIS Take 1 tablet (2.5 mg total) by mouth 2 (two) times daily.   Biotin 76283 MCG Tabs Take 10,000 mcg by mouth daily.   calcium carbonate 500 MG  chewable tablet Commonly known as: Tums Chew 1 tablet (200 mg of elemental calcium total) by mouth 2 (two) times daily as needed for indigestion or heartburn.   diphenoxylate-atropine 2.5-0.025 MG tablet Commonly known as: LOMOTIL Take 1 tablet by mouth every 4 (four) hours as needed for diarrhea or loose stools.   DULoxetine 60 MG capsule Commonly known as: CYMBALTA Take 1 capsule (60 mg total) by mouth daily.   esomeprazole 20 MG capsule Commonly known as: NEXIUM Take 20 mg by mouth daily at 12 noon.   guaiFENesin 100 MG/5ML liquid Commonly known as: ROBITUSSIN Take 200 mg by mouth every 4 (four) hours as needed for cough.   lipase/protease/amylase 15176 UNITS Cpep capsule Commonly known as: CREON Give two caps with each meal for pancreatic insuff. What changed:  how much to take how to take this when to take this additional instructions   loratadine 10 MG tablet Commonly known as: CLARITIN Take 1 tablet (10 mg total) by mouth daily.   metoprolol tartrate 25 MG tablet Commonly known as: LOPRESSOR Take 1 tablet (25 mg total) by mouth 2 (two) times daily.   nitrofurantoin 100 MG capsule Commonly known as: MACRODANTIN Take 100 mg by mouth daily.   Nyamyc powder Generic drug: nystatin Apply 1 application topically 2 (two) times daily. Apply under breast folds until healed   ondansetron 4 MG tablet Commonly known as: ZOFRAN Take 1 tablet (4 mg total) by mouth every 4 (four) hours as needed for nausea or vomiting.   polyvinyl alcohol 1.4 % ophthalmic solution Commonly known as: LIQUIFILM TEARS Place 1 drop into both eyes 4 (four) times daily as needed for dry eyes.   Probiotic 250 MG Caps Take 250 mg by mouth daily.   QUEtiapine 50 MG tablet Commonly known as: SEROQUEL Take 1.5 tablets (75 mg total) by mouth at bedtime.   spironolactone 50 MG tablet Commonly known as: ALDACTONE Take 50 mg by mouth daily.   torsemide 10 MG tablet Commonly known as:  DEMADEX Take 1 tablet (10 mg total) by mouth daily.   traZODone 50 MG tablet Commonly known as: DESYREL Take 0.5 tablets (25 mg total) by mouth at bedtime as needed for up to 5 days for sleep.        No Known Allergies  Consultations: None   Procedures/Studies: DG Chest 2 View  Result Date: 02/04/2021 CLINICAL DATA:  Altered mental status EXAM: CHEST - 2 VIEW COMPARISON:  Chest x-ray 04/07/2020 FINDINGS: Heart is enlarged. Mediastinum appears stable. Calcified plaques in the aortic arch. Left-sided cardiac device. Pulmonary vasculature is within normal limits. No focal consolidation, pleural effusion, or pneumothorax identified. IMPRESSION: Cardiomegaly with no acute process identified. Electronically Signed   By: Jannifer Hick M.D.   On: 02/04/2021 12:10   CT HEAD WO CONTRAST ( )  Result Date: 02/04/2021 CLINICAL DATA:  Mental status change EXAM: CT HEAD WITHOUT CONTRAST TECHNIQUE: Contiguous axial images were obtained from the base of the skull through the vertex without intravenous contrast. RADIATION DOSE REDUCTION: This exam was performed according to the departmental dose-optimization program which includes automated exposure control, adjustment of the mA and/or kV according to patient size  and/or use of iterative reconstruction technique. COMPARISON:  CT head 04/07/2020 FINDINGS: Brain: Mild generalized atrophy. Moderate white matter hypodensity bilaterally, chronic and unchanged Negative for acute infarct, hemorrhage, or mass. Vascular: Negative for hyperdense vessel Skull: Negative Sinuses/Orbits: Mild mucosal edema right maxillary sinus. Remaining sinuses clear. Bilateral cataract extraction Other: None IMPRESSION: No acute abnormality. Atrophy and chronic microvascular ischemic changes the white matter. No change from the prior CT. Electronically Signed   By: Franchot Gallo M.D.   On: 02/04/2021 11:24   (Echo, Carotid, EGD, Colonoscopy, ERCP)    Subjective: Patient seen  and examined.  Sleepy.  Did not engage in conversation. Nursing reported that she took her medications with assist then went back to sleep.   Discharge Exam: Vitals:   02/08/21 2040 02/09/21 0445  BP: 137/83 140/76  Pulse: 72 70  Resp: 16 17  Temp:  98.4 F (36.9 C)  SpO2: 94% 97%   Vitals:   02/08/21 0757 02/08/21 1549 02/08/21 2040 02/09/21 0445  BP: 129/64 126/61 137/83 140/76  Pulse: 70 70 72 70  Resp: 20 14 16 17   Temp: 98.1 F (36.7 C) 98.1 F (36.7 C) 98.2 F (36.8 C) 98.4 F (36.9 C)  TempSrc: Oral Oral Oral Oral  SpO2: 93% 100% 94% 97%  Weight:    97.3 kg    General: Pt is sleepy.  Awakens on stimulation but does not keep up conversation.  Frail and debilitated.  Chronically sick looking. Cardiovascular: RRR, S1/S2 +, no rubs, no gallops, pacemaker in place. Respiratory: CTA bilaterally, no wheezing, no rhonchi Abdominal: Soft, NT, ND, bowel sounds + Extremities: no edema, no cyanosis    The results of significant diagnostics from this hospitalization (including imaging, microbiology, ancillary and laboratory) are listed below for reference.     Microbiology: Recent Results (from the past 240 hour(s))  Urine Culture     Status: Abnormal   Collection Time: 02/04/21 11:20 AM   Specimen: Urine, Clean Catch  Result Value Ref Range Status   Specimen Description URINE, CLEAN CATCH  Final   Special Requests   Final    NONE Performed at [facility], [address] [place], [place] [address]    Culture >=100,000 COLONIES/mL PROTEUS MIRABILIS (A)  Final   Report Status 02/06/2021 FINAL  Final   Organism ID, Bacteria PROTEUS MIRABILIS (A)  Final      Susceptibility   Proteus mirabilis - MIC*    AMPICILLIN <=2 SENSITIVE Sensitive     CEFAZOLIN 8 SENSITIVE Sensitive     CEFEPIME <=0.12 SENSITIVE Sensitive     CEFTRIAXONE <=0.25 SENSITIVE Sensitive     CIPROFLOXACIN <=0.25 SENSITIVE Sensitive     GENTAMICIN <=1 SENSITIVE Sensitive     IMIPENEM 1  SENSITIVE Sensitive     NITROFURANTOIN 128 RESISTANT Resistant     TRIMETH/SULFA <=20 SENSITIVE Sensitive     AMPICILLIN/SULBACTAM <=2 SENSITIVE Sensitive     PIP/TAZO <=4 SENSITIVE Sensitive     * >=100,000 COLONIES/mL PROTEUS MIRABILIS  Resp Panel by RT-PCR (Flu A&B, Covid) Urine, In & Out Cath     Status: None   Collection Time: 02/04/21 12:46 PM   Specimen: Urine, In & Out Cath; Nasopharyngeal(NP) swabs in vial transport medium  Result Value Ref Range Status   SARS Coronavirus 2 by RT PCR NEGATIVE NEGATIVE Final    Comment: (NOTE) SARS-CoV-2 target nucleic acids are NOT DETECTED.  The SARS-CoV-2 RNA is generally detectable in upper respiratory specimens during the acute phase of infection.  The lowest concentration of SARS-CoV-2 viral copies this assay can detect is 138 copies/mL. A negative result does not preclude SARS-Cov-2 infection and should not be used as the sole basis for treatment or other patient management decisions. A negative result may occur with  improper specimen collection/handling, submission of specimen other than nasopharyngeal swab, presence of viral mutation(s) within the areas targeted by this assay, and inadequate number of viral copies(<138 copies/mL). A negative result must be combined with clinical observations, patient history, and epidemiological information. The expected result is Negative.  Fact Sheet for Patients:  BloggerCourse.com  Fact Sheet for Healthcare Providers:  SeriousBroker.it  This test is no t yet approved or cleared by the [place] FDA and  has been authorized for detection and/or diagnosis of SARS-CoV-2 by FDA under an Emergency Use Authorization (EUA). This EUA will remain  in effect (meaning this test can be used) for the duration of the COVID-19 declaration under Section 564(b)(1) of the Act, 21 U.S.C.section 360bbb-3(b)(1), unless the authorization is terminated  or  revoked sooner.       Influenza A by PCR NEGATIVE NEGATIVE Final   Influenza B by PCR NEGATIVE NEGATIVE Final    Comment: (NOTE) The Xpert Xpress SARS-CoV-2/FLU/RSV plus assay is intended as an aid in the diagnosis of influenza from Nasopharyngeal swab specimens and should not be used as a sole basis for treatment. Nasal washings and aspirates are unacceptable for Xpert Xpress SARS-CoV-2/FLU/RSV testing.  Fact Sheet for Patients: BloggerCourse.com  Fact Sheet for Healthcare Providers: SeriousBroker.it  This test is not yet approved or cleared by the [place] FDA and has been authorized for detection and/or diagnosis of SARS-CoV-2 by FDA under an Emergency Use Authorization (EUA). This EUA will remain in effect (meaning this test can be used) for the duration of the COVID-19 declaration under Section 564(b)(1) of the Act, 21 U.S.C. section 360bbb-3(b)(1), unless the authorization is terminated or revoked.  Performed at [facility], [address], [place], [place] [address]   Resp Panel by RT-PCR (Flu A&B, Covid) Nasopharyngeal Swab     Status: None   Collection Time: 02/08/21  1:08 PM   Specimen: Nasopharyngeal Swab; Nasopharyngeal(NP) swabs in vial transport medium  Result Value Ref Range Status   SARS Coronavirus 2 by RT PCR NEGATIVE NEGATIVE Final    Comment: (NOTE) SARS-CoV-2 target nucleic acids are NOT DETECTED.  The SARS-CoV-2 RNA is generally detectable in upper respiratory specimens during the acute phase of infection. The lowest concentration of SARS-CoV-2 viral copies this assay can detect is 138 copies/mL. A negative result does not preclude SARS-Cov-2 infection and should not be used as the sole basis for treatment or other patient management decisions. A negative result may occur with  improper specimen collection/handling, submission of specimen other than nasopharyngeal swab, presence of viral  mutation(s) within the areas targeted by this assay, and inadequate number of viral copies(<138 copies/mL). A negative result must be combined with clinical observations, patient history, and epidemiological information. The expected result is Negative.  Fact Sheet for Patients:  BloggerCourse.com  Fact Sheet for Healthcare Providers:  SeriousBroker.it  This test is no t yet approved or cleared by the [place] FDA and  has been authorized for detection and/or diagnosis of SARS-CoV-2 by FDA under an Emergency Use Authorization (EUA). This EUA will remain  in effect (meaning this test can be used) for the duration of the COVID-19 declaration under Section 564(b)(1) of the Act, 21 U.S.C.section 360bbb-3(b)(1), unless the authorization  is terminated  or revoked sooner.       Influenza A by PCR NEGATIVE NEGATIVE Final   Influenza B by PCR NEGATIVE NEGATIVE Final    Comment: (NOTE) The Xpert Xpress SARS-CoV-2/FLU/RSV plus assay is intended as an aid in the diagnosis of influenza from Nasopharyngeal swab specimens and should not be used as a sole basis for treatment. Nasal washings and aspirates are unacceptable for Xpert Xpress SARS-CoV-2/FLU/RSV testing.  Fact Sheet for Patients: EntrepreneurPulse.com.au  Fact Sheet for Healthcare Providers: IncredibleEmployment.be  This test is not yet approved or cleared by the [place] FDA and has been authorized for detection and/or diagnosis of SARS-CoV-2 by FDA under an Emergency Use Authorization (EUA). This EUA will remain in effect (meaning this test can be used) for the duration of the COVID-19 declaration under Section 564(b)(1) of the Act, 21 U.S.C. section 360bbb-3(b)(1), unless the authorization is terminated or revoked.  Performed at [facility], [place] [address] [place], [place] [address]      Labs: BNP (last 3 results) No  results for input(s): BNP in the last 8760 hours. Basic Metabolic Panel: Recent Labs  Lab 02/04/21 1023 02/05/21 0153 02/06/21 0126 02/09/21 0253  NA 137 138 142 138  K 4.3 3.9 4.7 4.3  CL 104 102 101 96*  CO2 26 26 31  32  GLUCOSE 108* 100* 103* 102*  BUN 26* 23 17 22   CREATININE 1.26* 1.15* 1.24* 1.37*  CALCIUM 8.7* 8.4* 9.1 9.3   Liver Function Tests: Recent Labs  Lab 02/04/21 1023  AST 15  ALT 11  ALKPHOS 118  BILITOT 0.8  PROT 7.3  ALBUMIN 3.8   No results for input(s): LIPASE, AMYLASE in the last 168 hours. No results for input(s): AMMONIA in the last 168 hours. CBC: Recent Labs  Lab 02/04/21 1023 02/05/21 0153  WBC 7.4 7.5  NEUTROABS 4.3  --   HGB 15.1* 12.8  HCT 47.8* 39.1  MCV 99.2 96.1  PLT 192 192   Cardiac Enzymes: No results for input(s): CKTOTAL, CKMB, CKMBINDEX, TROPONINI in the last 168 hours. BNP: Invalid input(s): POCBNP CBG: Recent Labs  Lab 02/04/21 1013  GLUCAP 99   D-Dimer No results for input(s): DDIMER in the last 72 hours. Hgb A1c No results for input(s): HGBA1C in the last 72 hours. Lipid Profile No results for input(s): CHOL, HDL, LDLCALC, TRIG, CHOLHDL, LDLDIRECT in the last 72 hours. Thyroid function studies No results for input(s): TSH, T4TOTAL, T3FREE, THYROIDAB in the last 72 hours.  Invalid input(s): FREET3 Anemia work up No results for input(s): VITAMINB12, FOLATE, FERRITIN, TIBC, IRON, RETICCTPCT in the last 72 hours. Urinalysis    Component Value Date/Time   COLORURINE YELLOW 02/04/2021 1120   APPEARANCEUR HAZY (A) 02/04/2021 1120   LABSPEC 1.017 02/04/2021 1120   PHURINE 6.0 02/04/2021 1120   GLUCOSEU NEGATIVE 02/04/2021 1120   GLUCOSEU NEGATIVE 06/24/2017 1459   HGBUR SMALL (A) 02/04/2021 1120   BILIRUBINUR NEGATIVE 02/04/2021 1120   BILIRUBINUR neg 05/16/2014 1421   KETONESUR NEGATIVE 02/04/2021 1120   PROTEINUR 30 (A) 02/04/2021 1120   UROBILINOGEN 0.2 06/24/2017 1459   NITRITE NEGATIVE 02/04/2021  1120   LEUKOCYTESUR LARGE (A) 02/04/2021 1120   Sepsis Labs Invalid input(s): PROCALCITONIN,  WBC,  LACTICIDVEN Microbiology Recent Results (from the past 240 hour(s))  Urine Culture     Status: Abnormal   Collection Time: 02/04/21 11:20 AM   Specimen: Urine, Clean Catch  Result Value Ref Range Status   Specimen Description URINE,  CLEAN CATCH  Final   Special Requests   Final    NONE Performed at [facility], [place] [address] [place], [place] [address]    Culture >=100,000 COLONIES/mL PROTEUS MIRABILIS (A)  Final   Report Status 02/06/2021 FINAL  Final   Organism ID, Bacteria PROTEUS MIRABILIS (A)  Final      Susceptibility   Proteus mirabilis - MIC*    AMPICILLIN <=2 SENSITIVE Sensitive     CEFAZOLIN 8 SENSITIVE Sensitive     CEFEPIME <=0.12 SENSITIVE Sensitive     CEFTRIAXONE <=0.25 SENSITIVE Sensitive     CIPROFLOXACIN <=0.25 SENSITIVE Sensitive     GENTAMICIN <=1 SENSITIVE Sensitive     IMIPENEM 1 SENSITIVE Sensitive     NITROFURANTOIN 128 RESISTANT Resistant     TRIMETH/SULFA <=20 SENSITIVE Sensitive     AMPICILLIN/SULBACTAM <=2 SENSITIVE Sensitive     PIP/TAZO <=4 SENSITIVE Sensitive     * >=100,000 COLONIES/mL PROTEUS MIRABILIS  Resp Panel by RT-PCR (Flu A&B, Covid) Urine, In & Out Cath     Status: None   Collection Time: 02/04/21 12:46 PM   Specimen: Urine, In & Out Cath; Nasopharyngeal(NP) swabs in vial transport medium  Result Value Ref Range Status   SARS Coronavirus 2 by RT PCR NEGATIVE NEGATIVE Final    Comment: (NOTE) SARS-CoV-2 target nucleic acids are NOT DETECTED.  The SARS-CoV-2 RNA is generally detectable in upper respiratory specimens during the acute phase of infection. The lowest concentration of SARS-CoV-2 viral copies this assay can detect is 138 copies/mL. A negative result does not preclude SARS-Cov-2 infection and should not be used as the sole basis for treatment or other patient management decisions. A negative result may occur with   improper specimen collection/handling, submission of specimen other than nasopharyngeal swab, presence of viral mutation(s) within the areas targeted by this assay, and inadequate number of viral copies(<138 copies/mL). A negative result must be combined with clinical observations, patient history, and epidemiological information. The expected result is Negative.  Fact Sheet for Patients:  EntrepreneurPulse.com.au  Fact Sheet for Healthcare Providers:  IncredibleEmployment.be  This test is no t yet approved or cleared by the [place] FDA and  has been authorized for detection and/or diagnosis of SARS-CoV-2 by FDA under an Emergency Use Authorization (EUA). This EUA will remain  in effect (meaning this test can be used) for the duration of the COVID-19 declaration under Section 564(b)(1) of the Act, 21 U.S.C.section 360bbb-3(b)(1), unless the authorization is terminated  or revoked sooner.       Influenza A by PCR NEGATIVE NEGATIVE Final   Influenza B by PCR NEGATIVE NEGATIVE Final    Comment: (NOTE) The Xpert Xpress SARS-CoV-2/FLU/RSV plus assay is intended as an aid in the diagnosis of influenza from Nasopharyngeal swab specimens and should not be used as a sole basis for treatment. Nasal washings and aspirates are unacceptable for Xpert Xpress SARS-CoV-2/FLU/RSV testing.  Fact Sheet for Patients: EntrepreneurPulse.com.au  Fact Sheet for Healthcare Providers: IncredibleEmployment.be  This test is not yet approved or cleared by the [place] FDA and has been authorized for detection and/or diagnosis of SARS-CoV-2 by FDA under an Emergency Use Authorization (EUA). This EUA will remain in effect (meaning this test can be used) for the duration of the COVID-19 declaration under Section 564(b)(1) of the Act, 21 U.S.C. section 360bbb-3(b)(1), unless the authorization is terminated  or revoked.  Performed at [facility], [place] [address], [place], [place] [address]   Resp Panel  by RT-PCR (Flu A&B, Covid) Nasopharyngeal Swab     Status: None   Collection Time: 02/08/21  1:08 PM   Specimen: Nasopharyngeal Swab; Nasopharyngeal(NP) swabs in vial transport medium  Result Value Ref Range Status   SARS Coronavirus 2 by RT PCR NEGATIVE NEGATIVE Final    Comment: (NOTE) SARS-CoV-2 target nucleic acids are NOT DETECTED.  The SARS-CoV-2 RNA is generally detectable in upper respiratory specimens during the acute phase of infection. The lowest concentration of SARS-CoV-2 viral copies this assay can detect is 138 copies/mL. A negative result does not preclude SARS-Cov-2 infection and should not be used as the sole basis for treatment or other patient management decisions. A negative result may occur with  improper specimen collection/handling, submission of specimen other than nasopharyngeal swab, presence of viral mutation(s) within the areas targeted by this assay, and inadequate number of viral copies(<138 copies/mL). A negative result must be combined with clinical observations, patient history, and epidemiological information. The expected result is Negative.  Fact Sheet for Patients:  EntrepreneurPulse.com.au  Fact Sheet for Healthcare Providers:  IncredibleEmployment.be  This test is no t yet approved or cleared by the [place] FDA and  has been authorized for detection and/or diagnosis of SARS-CoV-2 by FDA under an Emergency Use Authorization (EUA). This EUA will remain  in effect (meaning this test can be used) for the duration of the COVID-19 declaration under Section 564(b)(1) of the Act, 21 U.S.C.section 360bbb-3(b)(1), unless the authorization is terminated  or revoked sooner.       Influenza A by PCR NEGATIVE NEGATIVE Final   Influenza B by PCR NEGATIVE NEGATIVE Final    Comment: (NOTE) The Xpert Xpress  SARS-CoV-2/FLU/RSV plus assay is intended as an aid in the diagnosis of influenza from Nasopharyngeal swab specimens and should not be used as a sole basis for treatment. Nasal washings and aspirates are unacceptable for Xpert Xpress SARS-CoV-2/FLU/RSV testing.  Fact Sheet for Patients: EntrepreneurPulse.com.au  Fact Sheet for Healthcare Providers: IncredibleEmployment.be  This test is not yet approved or cleared by the [place] FDA and has been authorized for detection and/or diagnosis of SARS-CoV-2 by FDA under an Emergency Use Authorization (EUA). This EUA will remain in effect (meaning this test can be used) for the duration of the COVID-19 declaration under Section 564(b)(1) of the Act, 21 U.S.C. section 360bbb-3(b)(1), unless the authorization is terminated or revoked.  Performed at [facility], [place] [address] [place], [place] [address]      Time coordinating discharge:  40 minutes  SIGNED:   Barb Merino, MD  [facility] 02/09/2021, 9:59 AM

## 2021-02-09 NOTE — TOC Transition Note (Signed)
Transition of Care [facility]) - CM/SW Discharge Note   Patient Details  Name: Deborah Chung MRN: [id number] Date of Birth: 07/14/1926  Transition of Care [facility]) CM/SW Contact:  Jimmy Picket, LCSW Phone Number: 02/09/2021, 12:34 PM   Clinical Narrative:     Per MD patient ready for DC to [facility]. RN, patient, patient's family, and facility notified of DC. Discharge Summary and FL2 sent to facility. DC packet on chart. Insurance [employer] has been received and pt is covid negative. Ambulance transport requested for patient.    RN to call report to [phone]. Pt will go to room 101.  CSW will sign off for now as social work intervention is no longer needed. Please consult [place] again if new needs arise.   Final next level of care: Skilled Nursing Facility Barriers to Discharge: Barriers Resolved   Patient Goals and CMS Choice Patient states their goals for this hospitalization and ongoing recovery are:: to get better CMS Medicare.gov Compare Post Acute Care list provided to:: Patient Choice offered to / list presented to : Adult Children  Discharge Placement              Patient chooses bed at: [facility], [place] Patient to be transferred to facility by: ptar Name of family member notified: son Patient and family notified of of transfer: 02/09/21  Discharge Plan and Services                                     Social Determinants of Health (SDOH) Interventions     Readmission Risk Interventions No flowsheet data found.

## 2021-03-29 ENCOUNTER — Telehealth: Payer: Self-pay | Admitting: Internal Medicine

## 2021-03-29 NOTE — Telephone Encounter (Signed)
New Message: ? ? ? ?Please call Claiborne Billings at [facility]. She said patient's family said her Device Monitor was thrown away by mistake. ?

## 2021-03-29 NOTE — Telephone Encounter (Signed)
Spoke with Tresa Endo informed her that I would have Joey the [employer] rep go out to [employer] and set patient up a new home monitor.  ?

## 2021-06-08 ENCOUNTER — Telehealth: Payer: Self-pay

## 2021-06-08 NOTE — Telephone Encounter (Signed)
Joey states he will deliver the monitor to the patient on Tuesday.   I called Tresa Endo from [facility] to let her know that Aurelio Brash will deliver the home remote monitor on Tuesday and told her that I Aurelio Brash will reach out to her as well. Tresa Endo verbalized understanding and thanked me for my help.

## 2021-06-12 ENCOUNTER — Telehealth: Payer: Self-pay

## 2021-06-12 LAB — CUP PACEART REMOTE DEVICE CHECK
Brady Statistic RA Percent Paced: 76 %
Brady Statistic RV Percent Paced: 1 %
Date Time Interrogation Session: 20230530085400
Implantable Lead Implant Date: 20150112
Implantable Lead Implant Date: 20150112
Implantable Lead Location: 753859
Implantable Lead Location: 753860
Implantable Lead Model: 4135
Implantable Lead Model: 4136
Implantable Lead Serial Number: 29308444
Implantable Lead Serial Number: 29569556
Implantable Pulse Generator Implant Date: 20150112
Lead Channel Impedance Value: 555 Ohm
Lead Channel Impedance Value: 912 Ohm
Lead Channel Pacing Threshold Amplitude: 0.7 V
Lead Channel Pacing Threshold Amplitude: 0.9 V
Lead Channel Pacing Threshold Pulse Width: 0.4 ms
Lead Channel Pacing Threshold Pulse Width: 0.5 ms
Lead Channel Setting Pacing Amplitude: 1.2 V
Lead Channel Setting Pacing Amplitude: 2.4 V
Lead Channel Setting Pacing Pulse Width: 0.4 ms
Lead Channel Setting Sensing Sensitivity: 3 mV
Pulse Gen Serial Number: 391753

## 2021-06-12 NOTE — Telephone Encounter (Signed)
PPM reached RRT 05/28/21. Spoke to patients son [name], advised that device has reached RRT and patient is overdue for in-office check as well.    He states he would like to speak with Dr. Ladona Ridgel prior to making apt due to patient in skilled nursing facility and patients health condition overall vs. deciding if patient needs to have replaced.  Advised I will forward to Dr. Ladona Ridgel to advise. Appreciative for call.   Patients last OV was 01/28/2019.

## 2021-06-19 ENCOUNTER — Telehealth: Payer: Self-pay | Admitting: Internal Medicine

## 2021-06-19 NOTE — Telephone Encounter (Signed)
Son of the patient wanted to know what to do for the patient at this time. She is 69 , mostly bedridden and her Son is not able to transport her in his car any longer. She is currently a resident at [facility]. He states that the patient is currently a DNR patient but is not sure what the internal device would do.

## 2021-06-19 NOTE — Telephone Encounter (Signed)
Attempted to return call. No answer to either number.   Please when patient returns call ~ I have sent Dr. Ladona Ridgel a message regarding concern for patient not able to make it into office apts.as well as device is at RRT and currently awaiting for Dr. Ladona Ridgel to review. Dr. Ladona Ridgel is out of the office this week but will return next week. Will call once we hear back.

## 2021-07-09 ENCOUNTER — Telehealth: Payer: Self-pay

## 2021-07-09 ENCOUNTER — Ambulatory Visit (INDEPENDENT_AMBULATORY_CARE_PROVIDER_SITE_OTHER): Payer: Medicare Other | Admitting: Internal Medicine

## 2021-07-09 ENCOUNTER — Encounter: Payer: Self-pay | Admitting: Internal Medicine

## 2021-07-09 VITALS — Ht 63.0 in

## 2021-07-09 DIAGNOSIS — I442 Atrioventricular block, complete: Secondary | ICD-10-CM

## 2021-07-09 NOTE — Telephone Encounter (Signed)
Per Dr. Ladona Ridgel patient has reached ERI and will not get a gen change. The patient no longer needs to be remote monitored.

## 2021-12-24 IMAGING — CT CT CERVICAL SPINE W/O CM
3 of 4 series · 10 of 33 positions shown, 12 images · non-contrast
Comparison: CT head and cervical spine 02/22/2018

CLINICAL DATA: Fell backwards out of chair with posterior head
strike

EXAM:
CT HEAD WITHOUT CONTRAST
CT CERVICAL SPINE WITHOUT CONTRAST
TECHNIQUE: Multidetector CT imaging of the head and cervical spine was
performed following the standard protocol without intravenous
contrast. Multiplanar CT image reconstructions of the cervical spine
were also generated.

[Series 3: c_spine 2.0 st · axial · 0.29mm/px · z∈[-216,-152]mm · 2 of 97 slices shown, 3 images]
[im 33/97  soft-tissue]
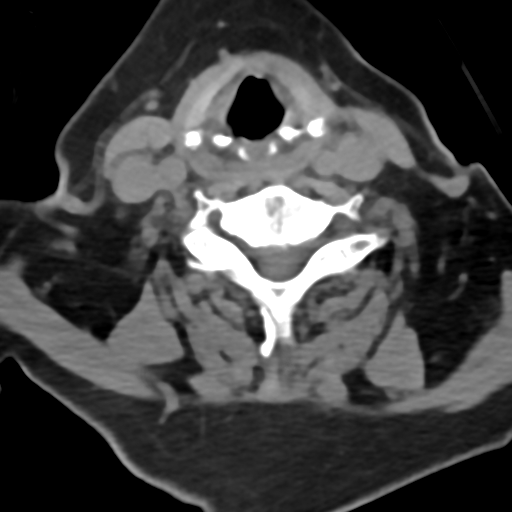
[im 33/97  bone]
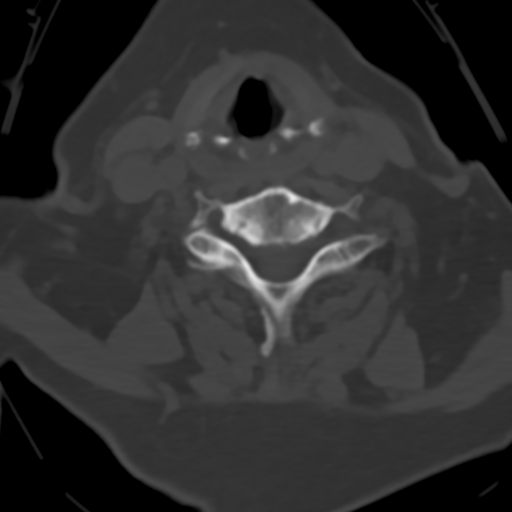
[im 65/97  bone]
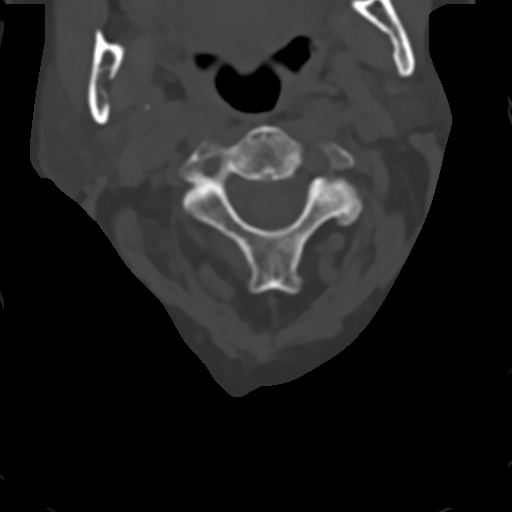

[Series 8: c_spine 2.0 sag bone · sagittal · 0.25mm/px · 5 of 61 slices shown, 6 images]
[im 21/61  bone]
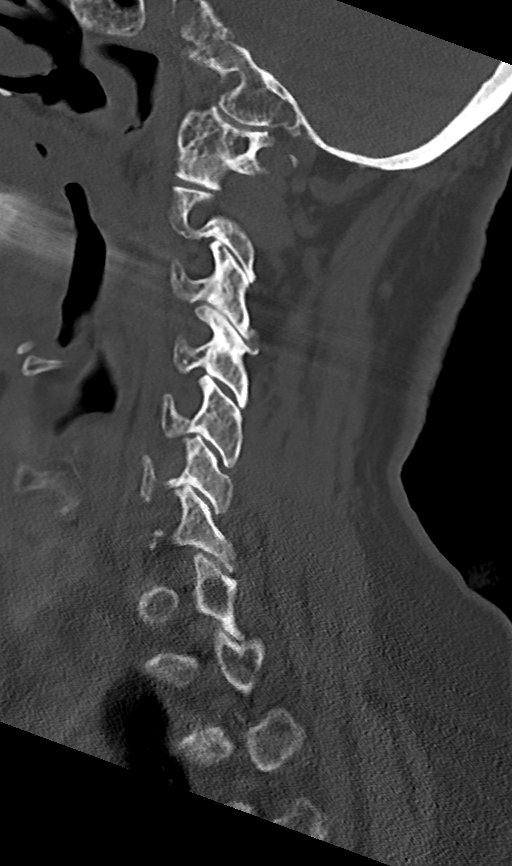
[im 26/61  bone]
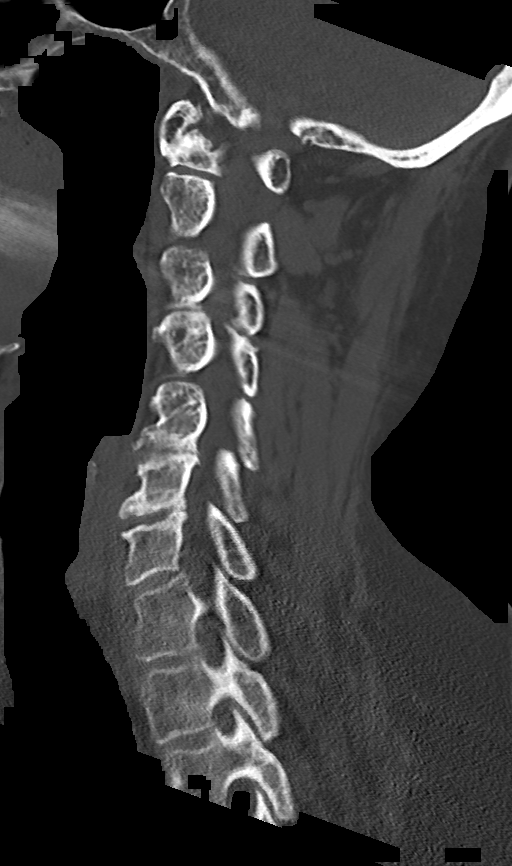
[im 31/61  soft-tissue]
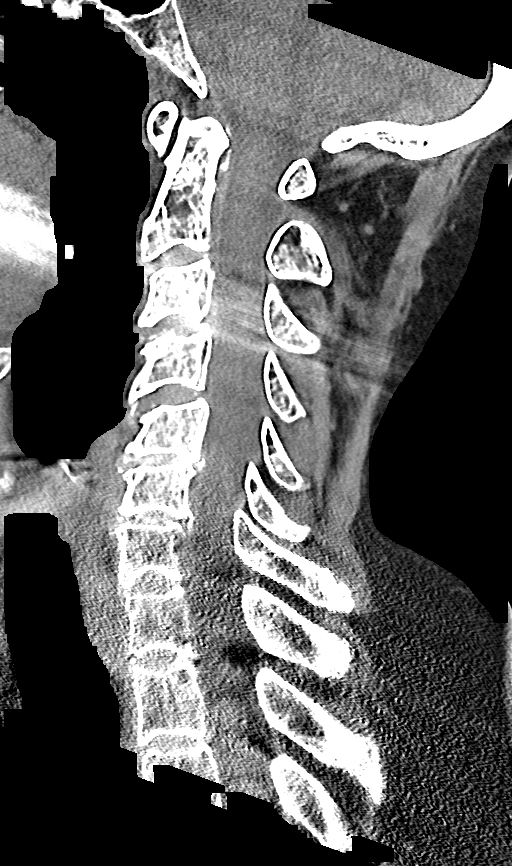
[im 31/61  bone]
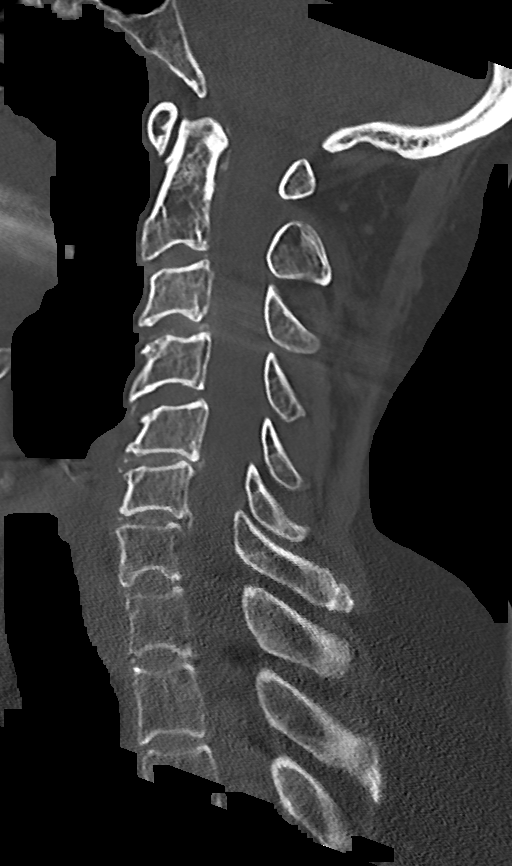
[im 36/61  bone]
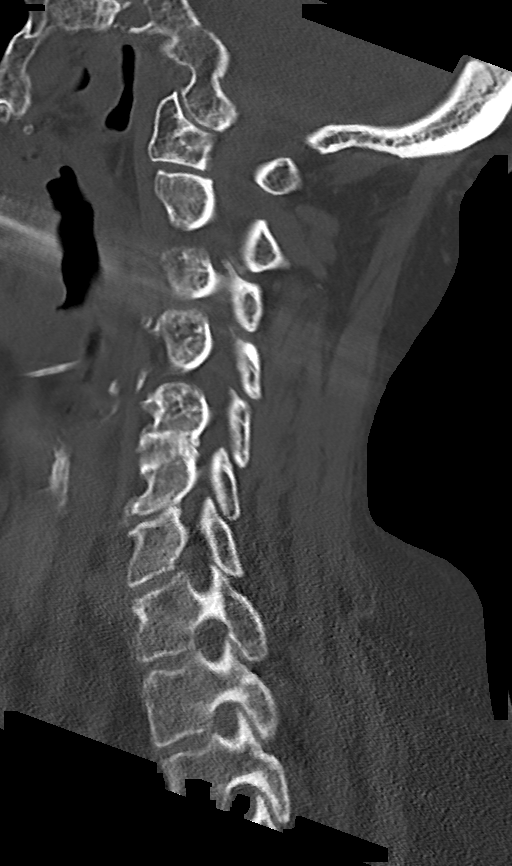
[im 41/61  bone]
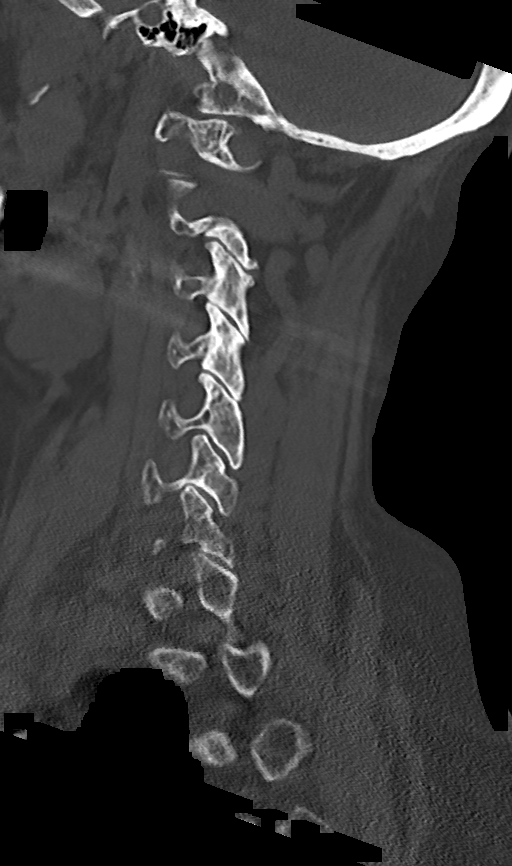

[Series 9: c_spine 2.0 cor bone · coronal · 0.25mm/px · 3 of 61 slices shown]
[im 13/61  bone]
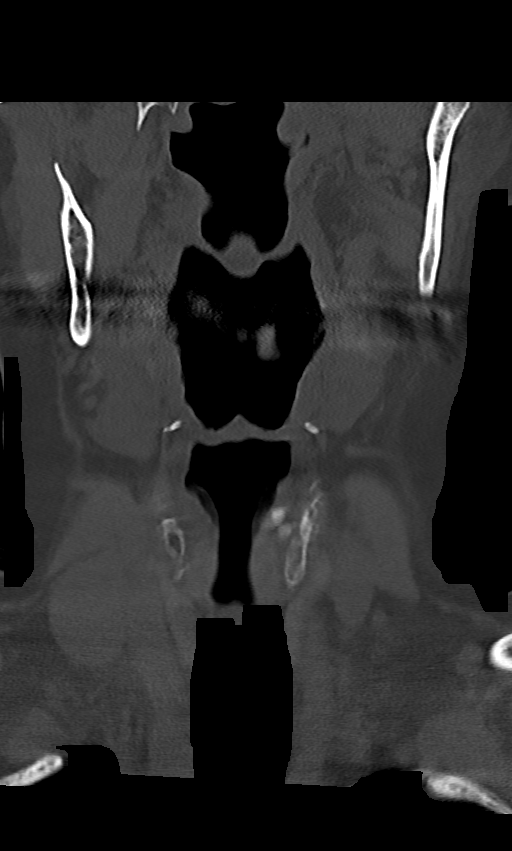
[im 25/61  bone]
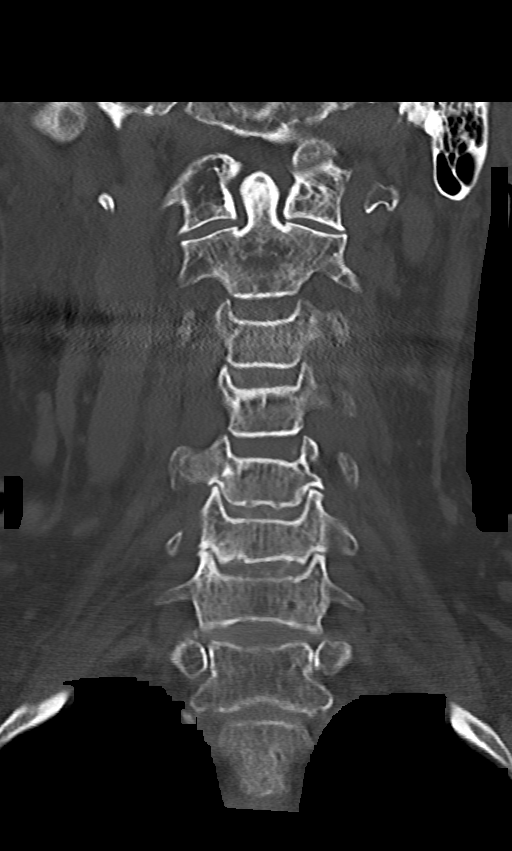
[im 37/61  bone]
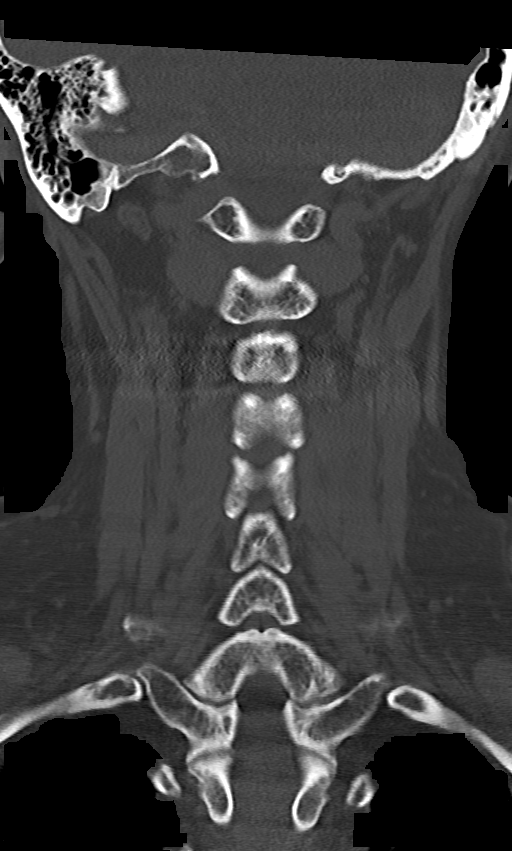

[10 of 33 positions shown; findings below may reference images not displayed]

FINDINGS: CT HEAD FINDINGS

Brain: No evidence of acute infarction, hemorrhage, hydrocephalus,
extra-axial collection or mass lesion/mass effect. Symmetric
prominence of the ventricles, cisterns and sulci compatible with
parenchymal volume loss. Patchy areas of white matter
hypoattenuation are most compatible with chronic microvascular
angiopathy.

Vascular: Atherosclerotic calcification of the carotid siphons. No
hyperdense vessel.

Skull: Contusive changes to the posterior scalp slightly to the left
of midline. No calvarial fracture or suspicious osseous lesion.

Sinuses/Orbits: Paranasal sinuses and mastoid air cells are
predominantly clear. Orbital structures are unremarkable aside from
prior lens extractions.

Other: None

CT CERVICAL SPINE FINDINGS

Alignment: Mild straightening of normal cervical lordosis.
Craniocervical and atlantoaxial articulations are maintained.
Minimal stepwise anterolisthesis C3-C5 on a likely degenerative
basis is overall unchanged from comparison study. No abnormally
widened, jumped or perched facets or other evidence of traumatic
listhesis.

Skull base and vertebrae: No acute fracture. No primary bone lesion
or focal pathologic process.

Soft tissues and spinal canal: No pre or paravertebral fluid or
swelling. No visible canal hematoma.

Disc levels: Multilevel intervertebral disc height loss with
spondylitic endplate changes. Features maximal at C5-6, C6-7 where
there is at most mild canal stenosis. Minimal uncinate spurring and
facet hypertrophic changes most pronounced at C3-4 and C5-6 result
in mild bilateral foraminal narrowing at these levels. No severe
canal stenosis or neural foraminal narrowing.

Upper chest: No acute abnormality in the upper chest or imaged lung
apices.

Other: Cervical carotid atherosclerosis. Normal thyroid.
IMPRESSION: 1. Contusive changes to the posterior scalp slightly to the left of
midline without underlying calvarial fracture or acute intracranial
abnormality.
2. Stable parenchymal volume loss and chronic microvascular ischemic
changes.
3. No acute fracture or traumatic listhesis of the cervical spine.
4. Multilevel degenerative disc disease and facet hypertrophic
changes of the cervical spine, maximal at C5-6, C6-7 where there is
at most mild canal stenosis. No severe canal stenosis or neural
foraminal narrowing.
5. Cervical and intracranial carotid atherosclerosis.

## 2022-09-05 IMAGING — DX DG KNEE 1-2V*L*
1 series · 2 of 2 positions shown · non-contrast
Comparison: LEFT tibia and fibula 05/16/2014

CLINICAL DATA: Bruising, swelling, and pain, fell 4 times this week
at nursing facility

EXAM:
LEFT KNEE - 1-2 VIEW

[Series 1: knee · 0.14mm/px · 2 of 2 slices shown]
[im 1/2]
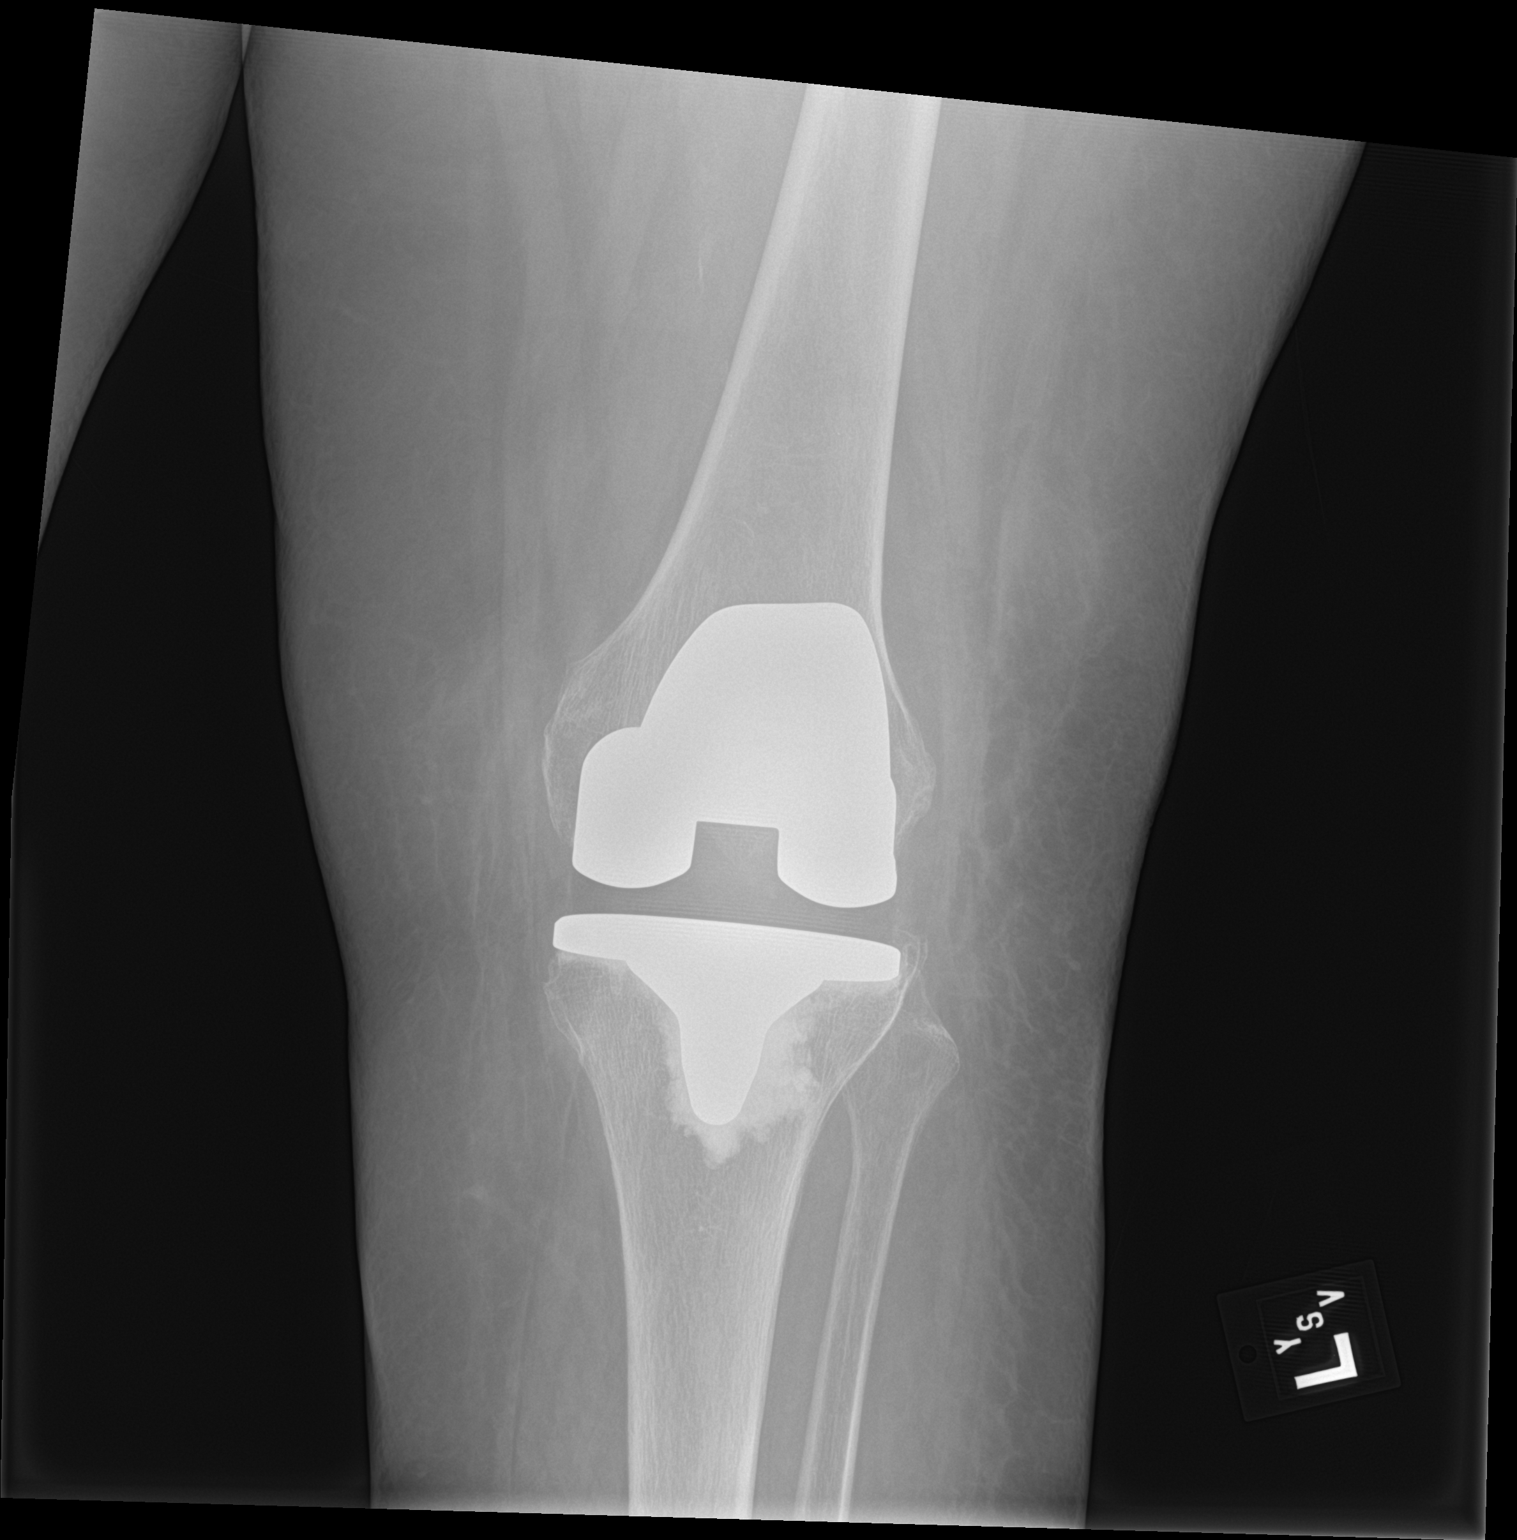
[im 2/2]
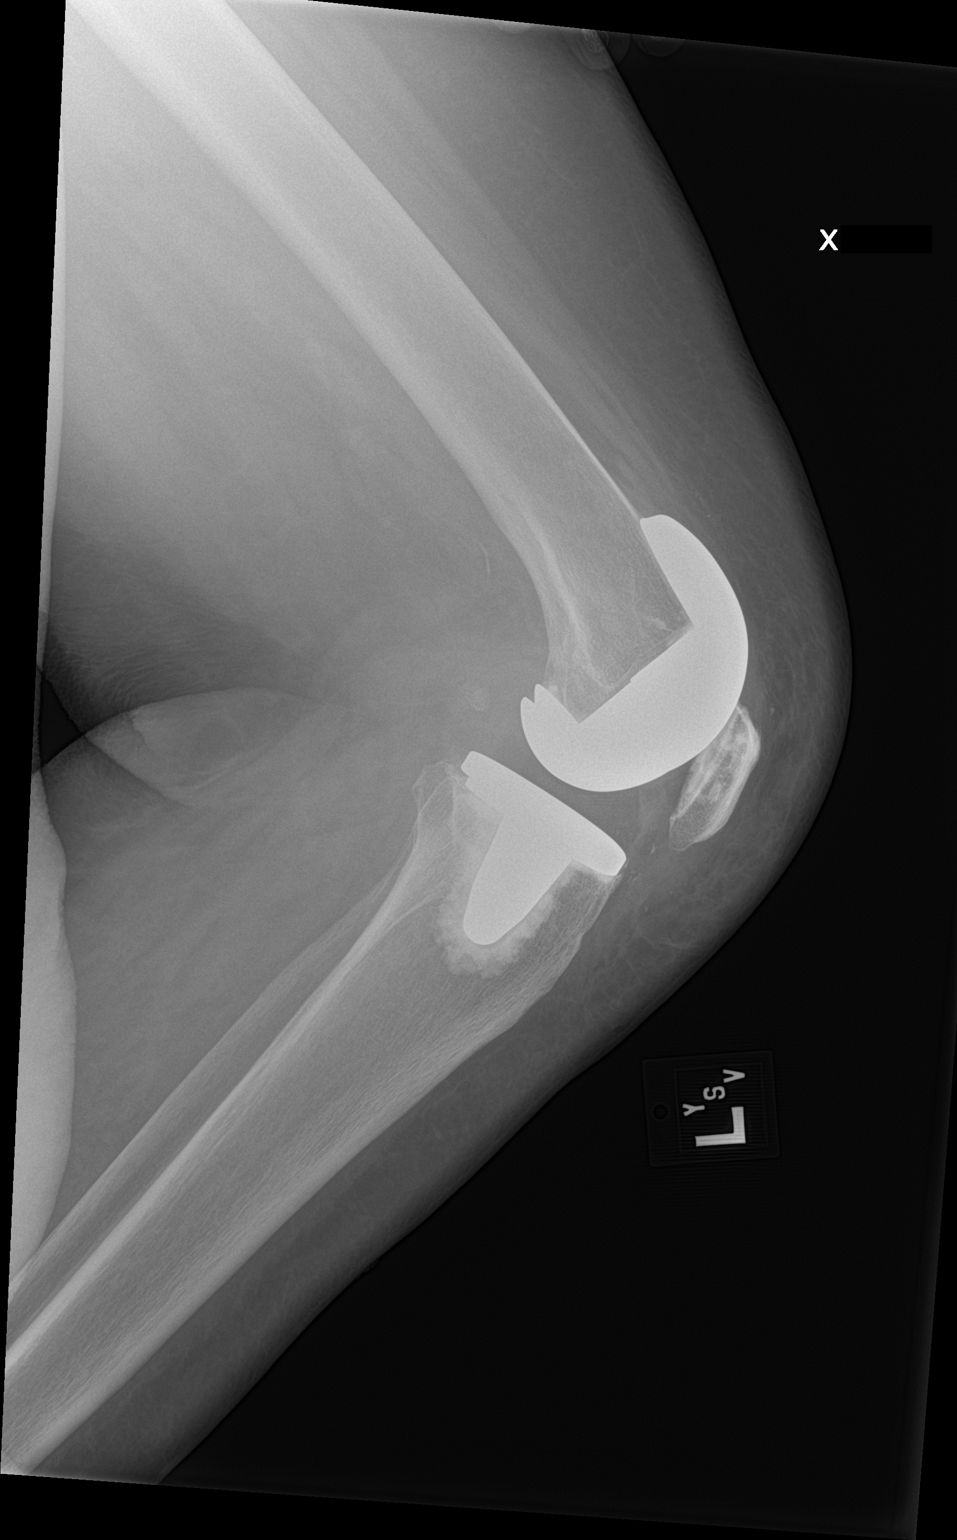

[2 of 2 positions shown; findings below may reference images not displayed]

FINDINGS: Osseous demineralization.

Components of LEFT knee prosthesis identified.

No acute fracture, dislocation, or bone destruction.

No joint effusion.
IMPRESSION: LEFT knee prosthesis and osseous demineralization.

No acute abnormalities.
